# Patient Record
Sex: Female | Born: 1940 | Race: White | Hispanic: No | Smoking: Never smoker
Health system: Southern US, Community
[De-identification: ages and names within clinical notes are randomized; demographics above are authoritative.]

## PROBLEM LIST (undated history)

## (undated) DIAGNOSIS — IMO0001 Reserved for inherently not codable concepts without codable children: Secondary | ICD-10-CM

## (undated) DIAGNOSIS — Z9889 Other specified postprocedural states: Secondary | ICD-10-CM

## (undated) DIAGNOSIS — N19 Unspecified kidney failure: Secondary | ICD-10-CM

## (undated) DIAGNOSIS — Z8673 Personal history of transient ischemic attack (TIA), and cerebral infarction without residual deficits: Secondary | ICD-10-CM

## (undated) DIAGNOSIS — E785 Hyperlipidemia, unspecified: Secondary | ICD-10-CM

## (undated) DIAGNOSIS — E039 Hypothyroidism, unspecified: Secondary | ICD-10-CM

## (undated) DIAGNOSIS — Z86718 Personal history of other venous thrombosis and embolism: Secondary | ICD-10-CM

## (undated) DIAGNOSIS — I6529 Occlusion and stenosis of unspecified carotid artery: Secondary | ICD-10-CM

## (undated) DIAGNOSIS — G4730 Sleep apnea, unspecified: Secondary | ICD-10-CM

## (undated) DIAGNOSIS — G8929 Other chronic pain: Secondary | ICD-10-CM

## (undated) DIAGNOSIS — Z95828 Presence of other vascular implants and grafts: Secondary | ICD-10-CM

## (undated) DIAGNOSIS — M1990 Unspecified osteoarthritis, unspecified site: Secondary | ICD-10-CM

## (undated) DIAGNOSIS — F419 Anxiety disorder, unspecified: Secondary | ICD-10-CM

## (undated) DIAGNOSIS — I2510 Atherosclerotic heart disease of native coronary artery without angina pectoris: Secondary | ICD-10-CM

## (undated) DIAGNOSIS — K222 Esophageal obstruction: Secondary | ICD-10-CM

## (undated) DIAGNOSIS — I10 Essential (primary) hypertension: Secondary | ICD-10-CM

## (undated) DIAGNOSIS — Z87442 Personal history of urinary calculi: Secondary | ICD-10-CM

## (undated) DIAGNOSIS — J189 Pneumonia, unspecified organism: Secondary | ICD-10-CM

## (undated) DIAGNOSIS — F32A Depression, unspecified: Secondary | ICD-10-CM

## (undated) DIAGNOSIS — I38 Endocarditis, valve unspecified: Secondary | ICD-10-CM

## (undated) DIAGNOSIS — D649 Anemia, unspecified: Secondary | ICD-10-CM

## (undated) DIAGNOSIS — I219 Acute myocardial infarction, unspecified: Secondary | ICD-10-CM

## (undated) DIAGNOSIS — J449 Chronic obstructive pulmonary disease, unspecified: Secondary | ICD-10-CM

## (undated) DIAGNOSIS — Z48812 Encounter for surgical aftercare following surgery on the circulatory system: Secondary | ICD-10-CM

## (undated) DIAGNOSIS — M79606 Pain in leg, unspecified: Secondary | ICD-10-CM

## (undated) DIAGNOSIS — K449 Diaphragmatic hernia without obstruction or gangrene: Secondary | ICD-10-CM

## (undated) DIAGNOSIS — D689 Coagulation defect, unspecified: Secondary | ICD-10-CM

## (undated) DIAGNOSIS — I639 Cerebral infarction, unspecified: Secondary | ICD-10-CM

## (undated) DIAGNOSIS — I2699 Other pulmonary embolism without acute cor pulmonale: Secondary | ICD-10-CM

## (undated) DIAGNOSIS — K219 Gastro-esophageal reflux disease without esophagitis: Secondary | ICD-10-CM

## (undated) DIAGNOSIS — R1310 Dysphagia, unspecified: Secondary | ICD-10-CM

## (undated) DIAGNOSIS — F329 Major depressive disorder, single episode, unspecified: Secondary | ICD-10-CM

## (undated) DIAGNOSIS — M109 Gout, unspecified: Secondary | ICD-10-CM

## (undated) DIAGNOSIS — K859 Acute pancreatitis without necrosis or infection, unspecified: Secondary | ICD-10-CM

## (undated) DIAGNOSIS — K922 Gastrointestinal hemorrhage, unspecified: Secondary | ICD-10-CM

## (undated) HISTORY — DX: Personal history of other venous thrombosis and embolism: Z86.718

## (undated) HISTORY — DX: Personal history of transient ischemic attack (TIA), and cerebral infarction without residual deficits: Z86.73

## (undated) HISTORY — PX: BREAST SURGERY: SHX581

## (undated) HISTORY — DX: Other pulmonary embolism without acute cor pulmonale: I26.99

## (undated) HISTORY — DX: Gout, unspecified: M10.9

## (undated) HISTORY — DX: Depression, unspecified: F32.A

## (undated) HISTORY — DX: Cerebral infarction, unspecified: I63.9

## (undated) HISTORY — PX: WISDOM TOOTH EXTRACTION: SHX21

## (undated) HISTORY — DX: Occlusion and stenosis of unspecified carotid artery: I65.29

## (undated) HISTORY — PX: APPENDECTOMY: SHX54

## (undated) HISTORY — DX: Endocarditis, valve unspecified: I38

## (undated) HISTORY — DX: Gastrointestinal hemorrhage, unspecified: K92.2

## (undated) HISTORY — DX: Other specified postprocedural states: Z98.890

## (undated) HISTORY — DX: Unspecified osteoarthritis, unspecified site: M19.90

## (undated) HISTORY — DX: Anemia, unspecified: D64.9

## (undated) HISTORY — PX: IVC FILTER INSERTION: CATH118245

## (undated) HISTORY — DX: Reserved for inherently not codable concepts without codable children: IMO0001

## (undated) HISTORY — DX: Presence of other vascular implants and grafts: Z95.828

## (undated) HISTORY — PX: SHOULDER SURGERY: SHX246

## (undated) HISTORY — DX: Dysphagia, unspecified: R13.10

## (undated) HISTORY — DX: Major depressive disorder, single episode, unspecified: F32.9

## (undated) HISTORY — DX: Unspecified kidney failure: N19

## (undated) HISTORY — DX: Anxiety disorder, unspecified: F41.9

## (undated) HISTORY — DX: Chronic obstructive pulmonary disease, unspecified: J44.9

## (undated) HISTORY — DX: Diaphragmatic hernia without obstruction or gangrene: K44.9

## (undated) HISTORY — DX: Pain in leg, unspecified: M79.606

## (undated) HISTORY — DX: Hyperlipidemia, unspecified: E78.5

## (undated) HISTORY — PX: TOTAL KNEE ARTHROPLASTY: SHX125

## (undated) HISTORY — DX: Encounter for surgical aftercare following surgery on the circulatory system: Z48.812

## (undated) HISTORY — DX: Gastro-esophageal reflux disease without esophagitis: K21.9

## (undated) HISTORY — DX: Other chronic pain: G89.29

## (undated) HISTORY — DX: Coagulation defect, unspecified: D68.9

## (undated) HISTORY — DX: Esophageal obstruction: K22.2

## (undated) HISTORY — DX: Essential (primary) hypertension: I10

---

## 1978-09-26 HISTORY — PX: ABDOMINAL HYSTERECTOMY: SHX81

## 2003-09-27 HISTORY — PX: JOINT REPLACEMENT: SHX530

## 2010-11-08 ENCOUNTER — Encounter (INDEPENDENT_AMBULATORY_CARE_PROVIDER_SITE_OTHER): Payer: Medicare Other | Admitting: Surgery

## 2010-11-08 DIAGNOSIS — I6529 Occlusion and stenosis of unspecified carotid artery: Secondary | ICD-10-CM

## 2010-11-09 NOTE — Assessment & Plan Note (Signed)
OFFICE VISIT  Tina Dominguez, Tina Dominguez DOB:  Nov 02, 1940                                       11/08/2010 ZOXWR#:[id number]  REASON FOR VISIT:  Right brain stroke in the setting of right carotid stenosis.  HISTORY:  This is a 70 year old female who presented to [facility] and was diagnosed with a stroke approximately 2 weeks ago.  In mid January she had a TIA like episode where she had numbness and weakness in her left arm and left leg.  She had a similar episode which prompted her to go into the hospital and was diagnosed as having a stroke.  A CT scan was performed which showed high-grade right carotid stenosis with scattered punctate focal infarcts within the right middle cerebral artery territory.  She was in the hospital for 3-4 days.  Her symptoms have now resolved with exception of some numbness in her left fifth finger.  She does report having another episode of numbness and weakness approximately 1 week ago.  The patient was discharged home on aspirin and Plavix and according to her she is taking a cholesterol medicine now.  She has a history of factor V deficiency.  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  She does have a Greenfield filter in place.  She is not on anticoagulation.  She suffers from hypertension which is medically managed.  REVIEW OF SYSTEMS:  GENERAL:  Negative for fevers, chills, weight gain, weight loss. VASCULAR:  Positive for pain in legs with walking, history of stroke and mini stroke, history of venous clot. CARDIAC:  Positive for palpitations. GI:  Positive for reflux and esophageal stricture. HEME:  Positive for bleeding problems and a clotting disorder. MUSCULOSKELETAL:  Positive for arthritis, joint pain and muscle pain. PSYCHIATRIC:  Positive for depression. SKIN:  Positive for rash on her back.  PAST MEDICAL HISTORY:  Hypertension, hypercholesterolemia, factor V clotting disorder, osteoporosis,  depression.  SOCIAL HISTORY:  She is widowed, does not smoke, does not drink.  Has a history of smoking.  FAMILY HISTORY:  Positive for clotting disorder in her mother.  Her grandmother died of a stroke at age 72.  Her father died of a heart attack at age 16.  MEDICATIONS:  Include Plavix and aspirin, query statin.  ALLERGIES:  None.  PHYSICAL EXAMINATION:  Vital signs:  Heart rate 76, blood pressure 157/81 in the right, 153/90 in the left, respiratory rate 12.  General: She is well-appearing, in no distress.  HEENT:  Within normal limits. Lungs:  Clear bilaterally.  Cardiovascular:  Regular rate and rhythm. No murmur.  No carotid bruits.  Abdomen:  Soft, nontender. Musculoskeletal:  Without major deformities.  She does endorse significant back pain.  Neurological:  Negative for focal deficits except for numbness in her left fifth finger.  Skin:  Without rash. Extremities:  Warm and well-perfused.  DIAGNOSTIC STUDIES:  I reviewed her CT angiogram which shows a high- grade approximately 80% stenosis in the right carotid artery.  The bifurcation is below the angle of the mandible.  I think this should be accessible, the disease extends up approximately 1.5 cm above the carotid bifurcation.  ASSESSMENT:  Symptomatic right carotid stenosis.  PLAN:  I discussed the risks and benefits of surgical versus nonsurgical treatment.  We have decided to proceed with surgical endarterectomy. Risks and benefits were discussed including the risk  of stroke, the risk of nerve injury, the risk of cardiopulmonary complications.  This has been scheduled for this Thursday, February 16.  I think that her carotid symptoms trump any potential cardiac symptoms.  She denies any chest pain or shortness of breath.  She saw Dr. Sherril Croon many years ago for palpitations.  Her workup was negative.  I think she is at low risk from a cardiac perspective.    Jorge Ny, MD Electronically  Signed  VWB/MEDQ  D:  11/08/2010  T:  11/09/2010  Job:  [id number]  cc:   Durenda Hurt, M.D.

## 2010-11-10 ENCOUNTER — Encounter (HOSPITAL_COMMUNITY)
Admission: RE | Admit: 2010-11-10 | Discharge: 2010-11-10 | Disposition: A | Discharge status: Home / Self Care | Payer: Medicare Other | Source: Ambulatory Visit | Attending: Surgery | Admitting: Surgery

## 2010-11-10 ENCOUNTER — Other Ambulatory Visit: Payer: Self-pay | Admitting: Surgery

## 2010-11-10 DIAGNOSIS — Z01818 Encounter for other preprocedural examination: Secondary | ICD-10-CM | POA: Insufficient documentation

## 2010-11-10 DIAGNOSIS — Z01812 Encounter for preprocedural laboratory examination: Secondary | ICD-10-CM | POA: Insufficient documentation

## 2010-11-10 DIAGNOSIS — I10 Essential (primary) hypertension: Secondary | ICD-10-CM

## 2010-11-10 LAB — COMPREHENSIVE METABOLIC PANEL
ALT: 13 U/L (ref 0–35)
AST: 16 U/L (ref 0–37)
Alkaline Phosphatase: 91 U/L (ref 39–117)
CO2: 28 mEq/L (ref 19–32)
Chloride: 105 mEq/L (ref 96–112)
GFR calc non Af Amer: >60 mL/min (ref >60)
Glucose, Bld: 106 mg/dL — ABNORMAL HIGH (ref 70–99)
Potassium: 3.6 mEq/L (ref 3.5–5.1)
Sodium: 140 mEq/L (ref 135–145)
Total Bilirubin: 0.2 mg/dL — ABNORMAL LOW (ref 0.3–1.2)

## 2010-11-10 LAB — URINALYSIS, ROUTINE W REFLEX MICROSCOPIC
Bilirubin Urine: NEGATIVE
Hgb urine dipstick: NEGATIVE
Ketones, ur: NEGATIVE mg/dL
Protein, ur: NEGATIVE mg/dL
Urine Glucose, Fasting: NEGATIVE mg/dL

## 2010-11-10 LAB — CBC
HCT: 39.4 % (ref 36.0–46.0)
Hemoglobin: 13.6 g/dL (ref 12.0–15.0)
MCH: 31.1 pg (ref 26.0–34.0)
MCV: 90.2 fL (ref 78.0–100.0)
Platelets: 414 10*3/uL — ABNORMAL HIGH (ref 150–400)
RBC: 4.37 MIL/uL (ref 3.87–5.11)

## 2010-11-10 LAB — SURGICAL PCR SCREEN: Staphylococcus aureus: POSITIVE — AB

## 2010-11-11 ENCOUNTER — Inpatient Hospital Stay (HOSPITAL_COMMUNITY)
Admission: RE | Admit: 2010-11-11 | Discharge: 2010-11-12 | DRG: 039 | Disposition: A | Discharge status: Home / Self Care | Payer: Medicare Other | Source: Ambulatory Visit | Attending: Surgery | Admitting: Surgery

## 2010-11-11 DIAGNOSIS — Z01818 Encounter for other preprocedural examination: Secondary | ICD-10-CM

## 2010-11-11 DIAGNOSIS — Z86711 Personal history of pulmonary embolism: Secondary | ICD-10-CM

## 2010-11-11 DIAGNOSIS — Z01812 Encounter for preprocedural laboratory examination: Secondary | ICD-10-CM

## 2010-11-11 DIAGNOSIS — I6529 Occlusion and stenosis of unspecified carotid artery: Secondary | ICD-10-CM

## 2010-11-11 DIAGNOSIS — I10 Essential (primary) hypertension: Secondary | ICD-10-CM | POA: Diagnosis present

## 2010-11-11 DIAGNOSIS — Z8673 Personal history of transient ischemic attack (TIA), and cerebral infarction without residual deficits: Secondary | ICD-10-CM

## 2010-11-11 DIAGNOSIS — K219 Gastro-esophageal reflux disease without esophagitis: Secondary | ICD-10-CM | POA: Diagnosis present

## 2010-11-11 DIAGNOSIS — Z96659 Presence of unspecified artificial knee joint: Secondary | ICD-10-CM

## 2010-11-11 DIAGNOSIS — M549 Dorsalgia, unspecified: Secondary | ICD-10-CM | POA: Diagnosis present

## 2010-11-11 HISTORY — PX: CAROTID ENDARTERECTOMY: SUR193

## 2010-11-12 ENCOUNTER — Other Ambulatory Visit: Payer: Self-pay | Admitting: Surgery

## 2010-11-12 LAB — BASIC METABOLIC PANEL
BUN: 6 mg/dL (ref 6–23)
CO2: 26 mEq/L (ref 19–32)
Calcium: 8.4 mg/dL (ref 8.4–10.5)
Creatinine, Ser: 0.65 mg/dL (ref 0.4–1.2)
GFR calc Af Amer: >60 mL/min (ref >60)

## 2010-11-12 LAB — CBC
MCH: 30.3 pg (ref 26.0–34.0)
MCHC: 33.8 g/dL (ref 30.0–36.0)
MCV: 89.8 fL (ref 78.0–100.0)
Platelets: 280 10*3/uL (ref 150–400)

## 2010-11-14 LAB — TYPE AND SCREEN
DAT, IgG: NEGATIVE
Donor AG Type: NEGATIVE
PT AG Type: NEGATIVE
Unit division: 0

## 2010-11-28 NOTE — Op Note (Addendum)
Tina Dominguez, TESMER              ACCOUNT NO.:  [id number]  MEDICAL RECORD NO.:  [id number]           PATIENT TYPE:  I  LOCATION:  3307                         FACILITY:  [facility]  PHYSICIAN:  Juleen China IV, MDDATE OF BIRTH:  1941/08/10  DATE OF PROCEDURE:  11/11/2010 DATE OF DISCHARGE:  11/10/2010                              OPERATIVE REPORT   PREOPERATIVE DIAGNOSIS:  Symptomatic right carotid stenosis.  POSTOPERATIVE DIAGNOSIS:  Symptomatic right carotid stenosis.  PROCEDURE PERFORMED:  Right carotid endarterectomy with patch angioplasty.  SURGEON: 1. Charlena Cross, MD  ASSISTANT:  Di Kindle. Edilia Bo, MD  ANESTHESIA:  General.  BLOOD LOSS:  150 mL.  FINDINGS:  Approximately 2 cm above the carotid bifurcation in the internal carotid artery, there was an Delaware of calcium which created approximately 80% stenosis.  There was also a plaque that had ruptured.  SPECIMENS:  Plaque.  DRAINS:  None.  INDICATIONS:  Ms. Encarnacion is a 70 year old female who I saw this past Monday with symptomatic right carotid stenosis.  She has had documentation confirming she has had a right brain stroke.  Her only residual symptoms have been numbness in her left fifth finger.  She has been treated with Plavix.  She did have a TIA-like symptom yesterday consisting of left arm and left leg numbness and weakness.  It had resolved fully at the time of the operation, it lasted approximately 45 minutes.  PROCEDURE:  The patient was identify in the holding area and taken to room 6, placed supine on the table.  General endotracheal anesthesia was administered.  The patient was prepped and draped in usual fashion.  I placed SCDs on both lower extremities and as soon as the patient was put to sleep, I gave her 3000 units of IV heparin.  This was because of the patient's factor V deficiency and history of PEs after her previous surgery.  She does now have a Greenfield filter in  place.  An incision was made along the anterior border of the right sternocleidomastoid.  Cautery was used throughout the subcutaneous tissue.  The platysma muscle was divided with electrocautery. Dissection was then carried down to the internal jugular vein.  The facial vein now was identified and divided between 2-0 silk ties and metal clips.  The patient had a rather low bifurcation.  I proceeded with exposure of the common carotid artery and made sure to have a very minimal manipulation of the carotid artery given her unstable symptoms. Common carotid artery was circumferentially dissected free and encircled with a vessel loop.  The vagus nerve was seen on the posterolateral side and protected.  I then isolated the superior thyroid and the external carotid artery.  These were encircled with vessel loop.  I then proceeded with cephalad dissection in the internal carotid artery.  I visualized the hypoglossal nerve, it was protected and visualized throughout the entire dissection of the internal carotid artery.  I used minimal manipulation of the internal carotid.  Once the internal carotid artery was satisfactorily dissected out, I gave the patient an additional heparin bolus.  I then occluded the internal carotid artery  followed by the external and common carotid artery.  A #11 blade was used to make an arteriotomy.  This was extended with Potts scissors along the anterolateral border of the common and internal carotid artery.  Posterior plaque was identified throughout most of the internal carotid artery and its proximal portion.  About 2 cm cephalad to the bifurcation, there was an area of thickened calcific plaque which provided approximately 75-80% stenosis.  Just above this was an area of hemorrhagic ruptured plaque.  It was very focal.  At this point, a 10- [place] shunt was placed.  Endarterectomy was then performed using a Kleinert-Kutz elevator.  Eversion endarterectomy was  performed in the external carotid artery.  A good distal endpoint in the internal carotid artery was obtained and the plaque was removed.  I inspected the endarterectomized bed for any potential embolic debris.  These were all removed.  I selected the bovine pericardial patch.  Patch angioplasty was then performed using a running 6-0 Prolene.  Prior to completion of the patch repair, the shunt was removed.  The internal common and external carotid arteries were flushed appropriately.  The artery was instilled with heparinized saline and the anastomosis was completed.  A clamp was first released on the external carotid artery followed by the common carotid artery.  Approximately 30 seconds later, the internal carotid clamp was released.  Handheld Doppler was used to evaluate the signals in the common, internal and external carotid artery; they all had appropriate signals.  I then reversed the patient's heparin with 75 mg of protamine.  Of note, she did receive several boluses of heparin throughout the procedure to make sure that she was well anticoagulated. Once I was satisfied with hemostasis, the carotid sheath was reapproximated with 3-0 Vicryl, the platysma muscle was closed with 3-0 Vicryl, and the skin was closed with running 4-0 Vicryl.  Dermabond was placed on the wound.     Jorge Ny, MD     VWB/MEDQ  D:  11/11/2010  T:  11/12/2010  Job:  [id number]  Electronically Signed by Arelia Longest IV MD on 11/28/2010 07:39:55 PM

## 2010-11-29 ENCOUNTER — Ambulatory Visit: Payer: Medicare Other | Admitting: Surgery

## 2010-11-29 ENCOUNTER — Other Ambulatory Visit (INDEPENDENT_AMBULATORY_CARE_PROVIDER_SITE_OTHER): Payer: Medicare Other

## 2010-11-29 ENCOUNTER — Ambulatory Visit (INDEPENDENT_AMBULATORY_CARE_PROVIDER_SITE_OTHER): Payer: Medicare Other | Admitting: Surgery

## 2010-11-29 DIAGNOSIS — G459 Transient cerebral ischemic attack, unspecified: Secondary | ICD-10-CM

## 2010-11-29 DIAGNOSIS — I6529 Occlusion and stenosis of unspecified carotid artery: Secondary | ICD-10-CM

## 2010-12-08 NOTE — Procedures (Unsigned)
CAROTID DUPLEX EXAM  INDICATION:  Known carotid disease with recent right upper extremity symptoms.  HISTORY: Diabetes:  No. Cardiac:  No. Hypertension:  Yes. Smoking:  No. Previous Surgery:  Right CEA, 11/11/2010. CV History:  Right fifth upper extremity digit "went numb" for approximately 30 minutes. Amaurosis Fugax No, Paresthesias Yes, Hemiparesis No.                                      RIGHT             LEFT Brachial systolic pressure: Brachial Doppler waveforms: Vertebral direction of flow:                          Antegrade DUPLEX VELOCITIES (cm/sec) CCA peak systolic                                     115 ECA peak systolic                                     80 ICA peak systolic                                     P = 98, M = 132 ICA end diastolic                                     P = 35, M = 51 PLAQUE MORPHOLOGY:                                    Calcific PLAQUE AMOUNT:                                        Minimal PLAQUE LOCATION: Bifurcation/proximal ICA  IMPRESSION: 1. Left-side only study, per Dr. Myra Gianotti. 2. No evidence of hemodynamically significant stenosis, based on     proximal internal carotid artery.  Mid internal carotid artery     velocities are somewhat elevated in the area of tortuosity.  ___________________________________________ V. Charlena Cross, MD  AS/MEDQ  D:  11/29/2010  T:  11/29/2010  Job:  [id number]

## 2010-12-14 ENCOUNTER — Other Ambulatory Visit: Payer: Self-pay | Admitting: Specialist

## 2010-12-14 DIAGNOSIS — M7130 Other bursal cyst, unspecified site: Secondary | ICD-10-CM

## 2010-12-15 ENCOUNTER — Ambulatory Visit
Admission: RE | Admit: 2010-12-15 | Discharge: 2010-12-15 | Disposition: A | Discharge status: Home / Self Care | Payer: Medicare Other | Source: Ambulatory Visit | Attending: Specialist | Admitting: Specialist

## 2010-12-15 ENCOUNTER — Other Ambulatory Visit: Payer: Self-pay | Admitting: Specialist

## 2010-12-15 DIAGNOSIS — R52 Pain, unspecified: Secondary | ICD-10-CM

## 2010-12-15 DIAGNOSIS — M549 Dorsalgia, unspecified: Secondary | ICD-10-CM

## 2010-12-15 DIAGNOSIS — M1990 Unspecified osteoarthritis, unspecified site: Secondary | ICD-10-CM

## 2010-12-15 DIAGNOSIS — IMO0002 Reserved for concepts with insufficient information to code with codable children: Secondary | ICD-10-CM

## 2010-12-15 DIAGNOSIS — M7130 Other bursal cyst, unspecified site: Secondary | ICD-10-CM

## 2010-12-15 DIAGNOSIS — M47816 Spondylosis without myelopathy or radiculopathy, lumbar region: Secondary | ICD-10-CM

## 2010-12-15 DIAGNOSIS — M479 Spondylosis, unspecified: Secondary | ICD-10-CM

## 2011-04-06 NOTE — Assessment & Plan Note (Signed)
OFFICE VISIT  Tina, Dominguez DOB:  Jan 19, 1941                                       11/29/2010 ZOXWR#:[id number]  The patient comes back today for her first postoperative visit.  She is status post right carotid endarterectomy on 02/16.  She was having some numbness in her hand and was found to have had a stroke.  Her operation was uncomplicated.  She remains neurologically intact.  She has had no issues since she has been at home.  She did state to me today that on the way over she had some numbness in her right fifth finger.  For that reason I ultrasounded her left carotid.  This shows no hemodynamically significant stenosis.  The patient's incision is well-healed.  She is neurologically intact.  I plan on seeing her back in 6 months with a repeat ultrasound.    Jorge Ny, MD Electronically Signed  VWB/MEDQ  D:  11/29/2010  T:  11/30/2010  Job:  [id number]  cc:   Durenda Hurt, M.D.

## 2011-05-13 ENCOUNTER — Encounter: Payer: Self-pay | Admitting: Surgery

## 2011-05-26 ENCOUNTER — Ambulatory Visit (INDEPENDENT_AMBULATORY_CARE_PROVIDER_SITE_OTHER): Payer: Medicare Other | Admitting: Emergency Medicine

## 2011-05-26 ENCOUNTER — Encounter: Payer: Self-pay | Admitting: Emergency Medicine

## 2011-05-26 DIAGNOSIS — I2699 Other pulmonary embolism without acute cor pulmonale: Secondary | ICD-10-CM | POA: Insufficient documentation

## 2011-05-26 DIAGNOSIS — F329 Major depressive disorder, single episode, unspecified: Secondary | ICD-10-CM

## 2011-05-26 DIAGNOSIS — I6350 Cerebral infarction due to unspecified occlusion or stenosis of unspecified cerebral artery: Secondary | ICD-10-CM

## 2011-05-26 DIAGNOSIS — K219 Gastro-esophageal reflux disease without esophagitis: Secondary | ICD-10-CM

## 2011-05-26 DIAGNOSIS — F3289 Other specified depressive episodes: Secondary | ICD-10-CM

## 2011-05-26 DIAGNOSIS — I6523 Occlusion and stenosis of bilateral carotid arteries: Secondary | ICD-10-CM | POA: Insufficient documentation

## 2011-05-26 DIAGNOSIS — I6529 Occlusion and stenosis of unspecified carotid artery: Secondary | ICD-10-CM

## 2011-05-26 DIAGNOSIS — M129 Arthropathy, unspecified: Secondary | ICD-10-CM

## 2011-05-26 DIAGNOSIS — I10 Essential (primary) hypertension: Secondary | ICD-10-CM | POA: Insufficient documentation

## 2011-05-26 DIAGNOSIS — I639 Cerebral infarction, unspecified: Secondary | ICD-10-CM | POA: Insufficient documentation

## 2011-05-26 DIAGNOSIS — M1990 Unspecified osteoarthritis, unspecified site: Secondary | ICD-10-CM | POA: Insufficient documentation

## 2011-05-26 DIAGNOSIS — E785 Hyperlipidemia, unspecified: Secondary | ICD-10-CM | POA: Insufficient documentation

## 2011-05-26 DIAGNOSIS — F32A Depression, unspecified: Secondary | ICD-10-CM | POA: Insufficient documentation

## 2011-05-26 HISTORY — DX: Other pulmonary embolism without acute cor pulmonale: I26.99

## 2011-05-26 HISTORY — DX: Gastro-esophageal reflux disease without esophagitis: K21.9

## 2011-05-26 HISTORY — DX: Unspecified osteoarthritis, unspecified site: M19.90

## 2011-05-26 HISTORY — DX: Occlusion and stenosis of bilateral carotid arteries: I65.23

## 2011-05-26 NOTE — Progress Notes (Signed)
  Subjective:    Patient ID: Tina Dominguez, female    DOB: 1941/07/06, 70 y.o.   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 HTN, controlled. No known hx of CAD or heart rhythm disturbance. She denies exertional SOB.   Review of Systems  Constitutional: Negative.   HENT: Negative.   Eyes: Negative.   Respiratory: Negative.   Cardiovascular: Positive for chest pain.  Gastrointestinal: Negative.   Genitourinary: Negative.   Musculoskeletal: Positive for arthralgias.  Skin: Negative.   Neurological: Negative.   Hematological: Negative.   Psychiatric/Behavioral: Negative.     Past Medical History  Diagnosis Date  . Hyperlipidemia   . Hypertension   . Clotting disorder   . Stroke   . Arthritis   . Depression   . Reflux   . Leg pain     with walking  . Osteoporosis   . Carotid artery occlusion      Family History  Problem Relation Age of Onset  . Clotting disorder Mother   . Heart disease Father 30  . Heart disease Brother   . Stroke Maternal Grandmother      History   Social History  . Marital Status: Widowed    Spouse Name: N/A    Number of Children: N/A  . Years of Education: N/A   Occupational History  . Not on file.   Social History Main Topics  . Smoking status: Never Smoker   . Smokeless tobacco: Not on file  . Alcohol Use: No  . Drug Use: No  . Sexually Active: Not on file   Other Topics Concern  . Not on file   Social History Narrative  . No narrative on file     No Known Allergies   Outpatient Prescriptions Prior to Visit  Medication Sig Dispense Refill  . clopidogrel (PLAVIX) 75 MG tablet Take 75 mg by mouth daily.        Marland Kitchen oxyCODONE-acetaminophen (PERCOCET) 5-325 MG per tablet Take 1 tablet by mouth every 4 (four) hours as needed.             Objective:   Physical Exam  Gen: Pleasant, well-nourished, in no distress,  normal affect  ENT: No lesions,  mouth clear,  oropharynx clear, no postnasal drip  Neck: No JVD, no TMG, no carotid bruits  Lungs: No use of accessory muscles, no dullness to percussion, clear without rales or rhonchi  Cardiovascular: RRR, heart sounds normal, no murmur or gallops, no peripheral edema  Musculoskeletal: No deformities, no cyanosis or clubbing  Neuro: alert, non focal  Skin: Warm, no lesions or rashes     Assessment & Plan:  PE (pulmonary embolism) Hx of PE, last time 7 yrs ago with IVC filter placed. Now back on coumadin x 5 months. She is mild to moderately elevated risk for surgery, but I believe the surgery can be done. We will need to insure that she is bridged with heparin prior to procedure, and then will need to minimize the amount of time she is off anticoagulation.  - will perform walking oximetry today -> no desat.  - will send this info to Dr Wynetta Emery. We will be happy to help with her care in house.

## 2011-05-26 NOTE — Patient Instructions (Signed)
Your history of pulmonary embolism puts you at mild to moderate increased risk for your surgery, but does not preclude you from getting the procedure They will need to bridge you with heparin prior to the procedure, and will need to minimize the amount of time you are off anticoagulation Your walking oximetry test was normal

## 2011-05-26 NOTE — Assessment & Plan Note (Signed)
Hx of PE, last time 7 yrs ago with IVC filter placed. Now back on coumadin x 5 months. She is mild to moderately elevated risk for surgery, but I believe the surgery can be done. We will need to insure that she is bridged with heparin prior to procedure, and then will need to minimize the amount of time she is off anticoagulation.  - will perform walking oximetry today -> no desat.  - will send this info to Dr Wynetta Emery. We will be happy to help with her care in house.

## 2011-06-03 ENCOUNTER — Encounter: Payer: Self-pay | Admitting: Surgery

## 2011-06-06 ENCOUNTER — Ambulatory Visit: Payer: Medicare Other | Admitting: Surgery

## 2011-06-06 ENCOUNTER — Other Ambulatory Visit: Payer: Medicare Other

## 2011-06-27 HISTORY — PX: BACK SURGERY: SHX140

## 2011-06-29 ENCOUNTER — Telehealth: Payer: Self-pay | Admitting: Emergency Medicine

## 2011-06-29 NOTE — Telephone Encounter (Signed)
vanessa advised. Carron Curie, CMA

## 2011-06-29 NOTE — Telephone Encounter (Signed)
Spoke with Erie Noe. She states needs to know how many days prior to surgery to bridge heparin- 2 or 4? Please advise, thanks!

## 2011-06-29 NOTE — Telephone Encounter (Signed)
Please let Erie Noe and Dr Wynetta Emery know that they should be able to bridge her in 2 days. The PT/INR will need to be at Dr Lonie Peak goal for surgery. Also need to minimize the duration of time she is off anticoagulation post-op, depending on Dr Lonie Peak findings. Thanks.

## 2011-07-06 ENCOUNTER — Other Ambulatory Visit (HOSPITAL_COMMUNITY): Payer: Medicare Other

## 2011-07-11 ENCOUNTER — Inpatient Hospital Stay (HOSPITAL_COMMUNITY)
Admission: AD | Admit: 2011-07-11 | Discharge: 2011-07-19 | DRG: 460 | Disposition: A | Discharge status: Home / Self Care | Payer: Medicare Other | Source: Ambulatory Visit | Attending: Neurosurgery | Admitting: Neurosurgery

## 2011-07-11 DIAGNOSIS — Q762 Congenital spondylolisthesis: Principal | ICD-10-CM

## 2011-07-11 DIAGNOSIS — Z96659 Presence of unspecified artificial knee joint: Secondary | ICD-10-CM

## 2011-07-11 DIAGNOSIS — Z8673 Personal history of transient ischemic attack (TIA), and cerebral infarction without residual deficits: Secondary | ICD-10-CM

## 2011-07-11 DIAGNOSIS — Z86711 Personal history of pulmonary embolism: Secondary | ICD-10-CM

## 2011-07-11 DIAGNOSIS — E7800 Pure hypercholesterolemia, unspecified: Secondary | ICD-10-CM | POA: Diagnosis present

## 2011-07-11 DIAGNOSIS — Z23 Encounter for immunization: Secondary | ICD-10-CM

## 2011-07-11 DIAGNOSIS — M7130 Other bursal cyst, unspecified site: Secondary | ICD-10-CM | POA: Diagnosis present

## 2011-07-11 DIAGNOSIS — Z79899 Other long term (current) drug therapy: Secondary | ICD-10-CM

## 2011-07-11 DIAGNOSIS — Z7901 Long term (current) use of anticoagulants: Secondary | ICD-10-CM

## 2011-07-11 DIAGNOSIS — I10 Essential (primary) hypertension: Secondary | ICD-10-CM | POA: Diagnosis present

## 2011-07-11 HISTORY — PX: SPINE SURGERY: SHX786

## 2011-07-12 ENCOUNTER — Inpatient Hospital Stay (HOSPITAL_COMMUNITY): Payer: Medicare Other

## 2011-07-12 LAB — CBC
Hemoglobin: 12.7 g/dL (ref 12.0–15.0)
MCH: 31.1 pg (ref 26.0–34.0)
MCHC: 34.2 g/dL (ref 30.0–36.0)
Platelets: 312 10*3/uL (ref 150–400)

## 2011-07-12 LAB — HEPARIN LEVEL (UNFRACTIONATED): Heparin Unfractionated: 0.41 IU/mL (ref 0.30–0.70)

## 2011-07-12 LAB — DIFFERENTIAL
Basophils Absolute: 0 10*3/uL (ref 0.0–0.1)
Basophils Relative: 0 % (ref 0–1)
Eosinophils Absolute: 0.1 10*3/uL (ref 0.0–0.7)
Monocytes Absolute: 0.3 10*3/uL (ref 0.1–1.0)
Neutro Abs: 4.2 10*3/uL (ref 1.7–7.7)
Neutrophils Relative %: 63 % (ref 43–77)

## 2011-07-12 LAB — BASIC METABOLIC PANEL
BUN: 16 mg/dL (ref 6–23)
Calcium: 9.6 mg/dL (ref 8.4–10.5)
Chloride: 105 mEq/L (ref 96–112)
Creatinine, Ser: 0.68 mg/dL (ref 0.50–1.10)
GFR calc Af Amer: >90 mL/min (ref >90)
GFR calc non Af Amer: 89 mL/min — ABNORMAL LOW (ref >90)

## 2011-07-12 LAB — PROTIME-INR
INR: 0.91 (ref 0.00–1.49)
Prothrombin Time: 12.5 seconds (ref 11.6–15.2)

## 2011-07-13 ENCOUNTER — Inpatient Hospital Stay (HOSPITAL_COMMUNITY): Payer: Medicare Other

## 2011-07-13 ENCOUNTER — Inpatient Hospital Stay (HOSPITAL_COMMUNITY): Admission: RE | Admit: 2011-07-13 | Payer: Medicare Other | Source: Ambulatory Visit | Admitting: Neurosurgery

## 2011-07-13 LAB — CBC
HCT: 33.3 % — ABNORMAL LOW (ref 36.0–46.0)
Hemoglobin: 11.2 g/dL — ABNORMAL LOW (ref 12.0–15.0)
MCH: 30.6 pg (ref 26.0–34.0)
MCHC: 33.6 g/dL (ref 30.0–36.0)
MCV: 91 fL (ref 78.0–100.0)
RDW: 13.1 % (ref 11.5–15.5)

## 2011-07-13 LAB — SURGICAL PCR SCREEN: Staphylococcus aureus: POSITIVE — AB

## 2011-07-13 LAB — PROTIME-INR: INR: 0.95 (ref 0.00–1.49)

## 2011-07-14 LAB — CBC
MCH: 30.7 pg (ref 26.0–34.0)
MCHC: 33 g/dL (ref 30.0–36.0)
MCV: 93.1 fL (ref 78.0–100.0)
Platelets: 228 10*3/uL (ref 150–400)
RBC: 3.48 MIL/uL — ABNORMAL LOW (ref 3.87–5.11)
RDW: 13.4 % (ref 11.5–15.5)

## 2011-07-14 LAB — BASIC METABOLIC PANEL
Calcium: 8.2 mg/dL — ABNORMAL LOW (ref 8.4–10.5)
Creatinine, Ser: 0.7 mg/dL (ref 0.50–1.10)
GFR calc Af Amer: >90 mL/min (ref >90)
GFR calc non Af Amer: 88 mL/min — ABNORMAL LOW (ref >90)
Sodium: 136 mEq/L (ref 135–145)

## 2011-07-15 LAB — CBC
Platelets: 207 10*3/uL (ref 150–400)
RBC: 3.16 MIL/uL — ABNORMAL LOW (ref 3.87–5.11)
RDW: 13.2 % (ref 11.5–15.5)
WBC: 8.7 10*3/uL (ref 4.0–10.5)

## 2011-07-16 LAB — CBC
HCT: 28.9 % — ABNORMAL LOW (ref 36.0–46.0)
Hemoglobin: 10 g/dL — ABNORMAL LOW (ref 12.0–15.0)
MCHC: 34.6 g/dL (ref 30.0–36.0)
WBC: 8.8 10*3/uL (ref 4.0–10.5)

## 2011-07-17 LAB — TYPE AND SCREEN
DAT, IgG: NEGATIVE
Unit division: 0
Unit division: 0

## 2011-07-17 LAB — CBC
Hemoglobin: 10.2 g/dL — ABNORMAL LOW (ref 12.0–15.0)
MCH: 30.7 pg (ref 26.0–34.0)
MCV: 91.9 fL (ref 78.0–100.0)
Platelets: 272 10*3/uL (ref 150–400)
RBC: 3.32 MIL/uL — ABNORMAL LOW (ref 3.87–5.11)
WBC: 6.6 10*3/uL (ref 4.0–10.5)

## 2011-07-17 LAB — PROTIME-INR: Prothrombin Time: 13.7 seconds (ref 11.6–15.2)

## 2011-07-18 LAB — PROTIME-INR: Prothrombin Time: 15.6 seconds — ABNORMAL HIGH (ref 11.6–15.2)

## 2011-07-18 LAB — CBC
Hemoglobin: 9.7 g/dL — ABNORMAL LOW (ref 12.0–15.0)
MCH: 30.2 pg (ref 26.0–34.0)
MCV: 92.2 fL (ref 78.0–100.0)
Platelets: 307 10*3/uL (ref 150–400)
RBC: 3.21 MIL/uL — ABNORMAL LOW (ref 3.87–5.11)
WBC: 6.2 10*3/uL (ref 4.0–10.5)

## 2011-07-18 NOTE — Op Note (Signed)
Tina Dominguez, Tina Dominguez NO.:  [id number]  MEDICAL RECORD NO.:  [id number]  LOCATION:  3041                         FACILITY:  [facility]  PHYSICIAN:  Donalee Citrin, M.D.        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  PROCEDURE:  Gill decompression L5-S1, decompressive laminectomy L4-L5, posterior lumbar interbody fusion L4-L5 and L5-S1, open reduction spinal deformity L5-S1, pedicle screw fixation L4-S1 using the globus revere pedicle screw 5.5 system, posterior lumbar interbody fusion L4-L5 using Telamon Peek cage 10 x 22 mm packed with locally harvested autograft mixed with morselized allograft and tangent allograft wedge at L5-S1. It was a left-sided T lift using the Capstone PEEK cage packed with local autograft mixed with Actifuse and BMP.  Posterolateral arthrodesis L4-S1 using locally harvested autograft mixed with morselized allograft and BMP.  SURGEON:  Donalee Citrin, MD  ASSISTANT:  Dutch Quint  ANESTHESIA:  General endotracheal.  HISTORY OF PRESENT ILLNESS:  The patient is a very pleasant 70 year old female who has had longstanding back and predominant left leg pain radiating down the front of her shin, top of foot and big toe as well as the outside of bottom of foot consistent with L4-L5 and S1 nerve root pattern.  MRI scan showed grade 1 spondylolisthesis at L4-L5 and L5-S1 with a large synovial cyst at L4-L5 causing severe thecal sac compression.  The patient failed all forms of treatments due to progressive neuro exam and MRI findings and clinical exam.  There is a dynamic instability.  The patient was recommended decompression stabilization procedure.  Risks and benefits of the  operation were discussed with the patient, she understands and agrees to proceed forward.  DESCRIPTION OF PROCEDURE:  The patient was brought to the OR, was induced under general anesthesia, positioned prone on the Wilson frame. Back was prepped and draped in the usual sterile fashion.  Preop incision was localized at appropriate level.  So, a midline incision was made.  Bovie electrocautery was used to take down the subcutaneous. Subperiosteal dissection was carried on the lamina of L4, L5, and S1. She was noted to have marked facet arthropathy at L4-L5 and L5-S1 and unstable laminar complex at L5.  So after T-pieces were exposed at L4 as well as the sacral alae and T-piece at L5, intraoperative x-ray confirmed identification at appropriate level.  The spinous processes at L4 and L5 were then removed.  Central decompression was initiated. Complete medial facetectomies were initially performed on the right at L4-L5.  This allowed identification of the decompression of the 4 and the 5 root on the right.  Then, the large synovial cyst was immediately identified.  This was teased off the dura after being densely adherent decompressing the 4 and 5 on the left with a complete medial facetectomy performed there.  Then, after central decompression was performed at L5- S1 with a complete medial facetectomy on the left looking  at the right side, there was what appeared to be almost like a conjoined S1-S2 takeoff.  There was no space to get around the root complex in order to do any kind of interbody work.  So, it was elected at this level that I would do a T-lift.  So after all the decompression had been completed and all the foramina were widely patent, attention was taken to the interbody work.  The disk space was incised at L5-S1 in the left, cleaned out with pituitary rongeurs, sequentially dilated from a 6, 7, to an 8 interbody wedge using fluoroscopy each step along the way. Then, using a  size 8 paddle scraper as well as downgoing side-biting curettes, the endplates were scraped.  The disk space was opened up. This helped to reduce the L5-S1 spondylolisthesis.  After adequate endplate preparation been achieved, BMP and morselized allograft was packed centrally and off to the patient's right.  Then, a Capstone which had been packed with morselized allograft and autograft was then placed from the patient's left side across the midline at an angle and this was a 8 x 26 mm Capstone PEEK cage.  After fluoroscopy confirmed good position of the cage, attention was taken to L4-L5.  In a similar pattern, L4-L5 was cleaned out bilaterally with a 10 distractor, which had good apposition of the endplates.  The endplates were scraped with a size 10 cutter and chisel on the patient's right side.  A tangent autograft wedge was inserted on the right.  Then, the distractor was removed.  The endplates were prepped in similar fashion.  BMP and autograft was packed centrally after adequate endplate preparation was achieved and a Telamon PEEK cage was inserted.  After fluoroscopy confirmed good position of all the interbody implants, attention was taken to placement.  Using a high-speed drill, pilot holes were drilled at L4, cannulated with the awl, probed and tapped with a 5 x 5 tap probed again and 6 x 45 screw inserted L4.  In a similar fashion, 6 x 40 screw was inserted at L5 and a 6 x 35 at S1 capturing bicortical purchase on both S1 screws and this was repeated bilaterally.  Then, after all screws were placed, aggressive decortication carried out at T- piece and lateral gutters.  Remainder of local autograft was packed posterolaterally.  Rods were then placed to help tighten the __________ down and L4 was compressed against L5 and L5 was compressed against S1. Then, a large Hemovac drain was placed.  After the foramina were all reinspected to confirm patency, no migration of graft  material, Gelfoam was laid on top of the dura.  A drain was placed and was closed in layers with Vicryl and skin was closed running 4-0 subcuticular. Benzoin and Steri-Strips were applied.  The patient went to recovery room in stable condition.          ______________________________ Donalee Citrin, M.D.     GC/MEDQ  D:  07/13/2011  T:  07/13/2011  Job:  [id number]  Electronically Signed by Donalee Citrin M.D. on 07/18/2011 03:21:38 PM

## 2011-07-18 NOTE — Consult Note (Signed)
  NAMEDORETHY, TOMEY NO.:  [id number]  MEDICAL RECORD NO.:  [id number]  LOCATION:  3041                         FACILITY:  [facility]  PHYSICIAN:  Donalee Citrin, M.D.        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  PROCEDURE DURING THIS HOSPITALIZATION:  Decompression and stabilization procedure at L4-5 and L5-S1.  HISTORY OF PRESENT ILLNESS:  The patient is a very pleasant 70 year old female with a longstanding back and prominent left buttock and leg pain going down since beginning of the year.  It began in her hip, radiates down the front of the shin and top of the foot and consistent with an L5 nerve root pattern.  She went to an epidural, did not get any benefit. She continued to do exercise and medication with no relief.  She has had an MRI scan which showed a synovial cyst at L4-5 causing severe stenosis as well as spondylolisthesis and degenerative disk disease at L4-5 and L5-S1.  The patient has been recommended decompression stabilization procedure and has been recommended to come in the hospital for heparin before surgery.  The patient reports she stopped her Coumadin last week per the hospital recommendations and now she presents for heparinization.  PAST MEDICAL HISTORY:  Remarkable for, 1. Hypertension. 2. Hypercholesterolemia. 3. Multiple PEs, most recent which was 8 years ago following a knee     replacement.  She is a nonsmoker.  She has no medication allergies.  MEDICATIONS:  The current medication list includes, simvastatin, Percocet, lisinopril, hydrochlorothiazide, citalopram, and amitriptyline.  PHYSICAL EXAMINATION:  GENERAL:  The patient is awake and alert and oriented, 70 year old female in no acute distress. HEENT:  Within normal limits. EXTREMITIES:  Lower  extremity strength is 5/5 in iliopsoas, quads, hamstrings, gastroc, and EHL.  Normal symmetrical reflexes and sensation.  IMAGING:  MRI scan showing again the synovial cyst at L4-5 and L5-S1 and patient presents for decompression stabilization procedure.  Patient will be initiated first with Lovenox.  We are awaiting for the blood work to initiate heparin.  We will go ahead with heparin infusion overnight, discontinue at 8 a.m. prior to surgery, and then reinitiate it as soon as we can after surgery.  The patient will undergo a routine admit lab work for final EKG and chest x-ray.          ______________________________ Donalee Citrin, M.D.     GC/MEDQ  D:  07/12/2011  T:  07/13/2011  Job:  [id number]  Electronically Signed by Donalee Citrin M.D. on 07/18/2011 03:21:32 PM

## 2011-07-19 LAB — CBC
Hemoglobin: 9.9 g/dL — ABNORMAL LOW (ref 12.0–15.0)
Platelets: 346 10*3/uL (ref 150–400)
RBC: 3.27 MIL/uL — ABNORMAL LOW (ref 3.87–5.11)
WBC: 6.4 10*3/uL (ref 4.0–10.5)

## 2011-07-19 LAB — PROTIME-INR: Prothrombin Time: 18.6 seconds — ABNORMAL HIGH (ref 11.6–15.2)

## 2011-07-29 ENCOUNTER — Encounter: Payer: Self-pay | Admitting: Surgery

## 2011-08-01 ENCOUNTER — Ambulatory Visit: Payer: Medicare Other | Admitting: Surgery

## 2011-08-01 ENCOUNTER — Other Ambulatory Visit: Payer: Medicare Other

## 2011-08-01 NOTE — Discharge Summary (Signed)
NAMEGLENIS, MUSOLF NO.:  [id number]  MEDICAL RECORD NO.:  [id number]  LOCATION:  3017                         FACILITY:  [facility]  PHYSICIAN:  Donalee Citrin, M.D.        DATE OF BIRTH:  [birth year]  DATE OF ADMISSION:  07/11/2011 DATE OF DISCHARGE:  07/19/2011                              DISCHARGE SUMMARY   ADMITTING DIAGNOSIS:  Grade 1 spondylolisthesis with lumbar spine stenosis, L4-L5 and L5-S1.  DISCHARGE DIAGNOSIS:  Grade 1 spondylolisthesis with lumbar spine stenosis, L4-L5 and L5-S1.  HOSPITAL COURSE:  The patient was admitted on preop day 2 for heparinization, was admitted to the hospital bed 4, was placed on IV heparin, and stopped all of her Coumadin and that was on a Monday evening.  The patient remained stable over Tuesday afternoon, was taken to the operating on Wednesday.  Postoperatively, the patient developed severe hypertension and had to be transferred back to the ICU.  In the ICU, the patient had significant pain control issues and confusions, was observed. Did get a little bit better, was able to be transferred subsequently to the floor and on the floor, the patient improved significantly, was reinitiated on her Coumadin treatments on postop day 3.  She was ambulating with physical therapy, she was voiding spontaneously, she was tolerating regular diet, and at the time of discharge, pain was well controlled on oral analgesics.          ______________________________ Donalee Citrin, M.D.     GC/MEDQ  D:  07/31/2011  T:  07/31/2011  Job:  [id number]

## 2011-10-07 ENCOUNTER — Encounter: Payer: Self-pay | Admitting: Surgery

## 2011-10-10 ENCOUNTER — Ambulatory Visit (INDEPENDENT_AMBULATORY_CARE_PROVIDER_SITE_OTHER): Payer: Medicare Other | Admitting: Surgery

## 2011-10-10 ENCOUNTER — Encounter: Payer: Self-pay | Admitting: Surgery

## 2011-10-10 ENCOUNTER — Ambulatory Visit (INDEPENDENT_AMBULATORY_CARE_PROVIDER_SITE_OTHER): Payer: Medicare Other | Admitting: *Deleted

## 2011-10-10 DIAGNOSIS — I6529 Occlusion and stenosis of unspecified carotid artery: Secondary | ICD-10-CM

## 2011-10-10 DIAGNOSIS — Z48812 Encounter for surgical aftercare following surgery on the circulatory system: Secondary | ICD-10-CM

## 2011-10-10 DIAGNOSIS — Z9889 Other specified postprocedural states: Secondary | ICD-10-CM

## 2011-10-10 HISTORY — DX: Other specified postprocedural states: Z98.890

## 2011-10-10 NOTE — Progress Notes (Signed)
[facility]  [place]   Patient name: Tina Dominguez MRN: [id number] DOB: Nov 05, 1940 Sex: female     Chief Complaint  Patient presents with  . Carotid    6 month f/u with duplex today   ( Right CEA 11-11-10)    HISTORY OF PRESENT ILLNESS: Patient comes back today for followup. She is status post right carotid endarterectomy in February of 2012. This was done in the setting of her having had a stroke. She initially presented to [facility] in mid January with a TIA like episode with numbness and weakness in her left arm and left leg. She had a CT scan which showed a high-grade right carotid stenosis with scattered punctate focal infarcts within the right middle cerebral artery territory. Her operation was uncomplicated. She is back today for followup. She is not had any new neurologic symptoms. Since last saw her she has undergone lumbar fusion.  Past Medical History  Diagnosis Date  . Hyperlipidemia   . Hypertension   . Clotting disorder   . Stroke   . Arthritis   . Depression   . Reflux   . Leg pain     with walking  . Osteoporosis   . Carotid artery occlusion     Past Surgical History  Procedure Date  . Total knee arthroplasty     right  and left  . Shoulder surgery     right  . Appendectomy   . Breast surgery     x2  . Abdominal hysterectomy   . Carotid endarterectomy 11/11/2010    Right CEA  . Spine surgery 07-11-11    Decomp. Laminectomy, fusion of L4-5,L5-S1 by Dr. Wynetta Emery    History   Social History  . Marital Status: Widowed    Spouse Name: N/A    Number of Children: N/A  . Years of Education: N/A   Occupational History  . Not on file.   Social History Main Topics  . Smoking status: Never Smoker   . Smokeless tobacco: Not on file  . Alcohol Use: No  . Drug Use: No  . Sexually Active: Not on file   Other Topics Concern  . Not on file   Social History Narrative  . No narrative on file    Family History  Problem  Relation Age of Onset  . Clotting disorder Mother   . Heart disease Father 42  . Heart disease Brother   . Stroke Maternal Grandmother     Allergies as of 10/10/2011  . (No Known Allergies)    Current Outpatient Prescriptions on File Prior to Visit  Medication Sig Dispense Refill  . amitriptyline (ELAVIL) 75 MG tablet Take by mouth at bedtime.        . citalopram (CELEXA) 40 MG tablet Take 40 mg by mouth daily.        Marland Kitchen oxyCODONE-acetaminophen (PERCOCET) 5-325 MG per tablet Take 1 tablet by mouth every 4 (four) hours as needed.        . pravastatin (PRAVACHOL) 20 MG tablet Take 20 mg by mouth daily.        Marland Kitchen warfarin (COUMADIN) 5 MG tablet Take 5 mg by mouth as directed.        . clopidogrel (PLAVIX) 75 MG tablet Take 75 mg by mouth daily.           REVIEW OF SYSTEMS: No new changes his previous visit with the exception of lumbar back surgery.  PHYSICAL EXAMINATION:   Vital signs  are BP 165/107  Pulse 62  Resp 16  Ht 5\' 3"  (1.6 m)  Wt 162 lb (73.483 kg)  BMI 28.70 kg/m2  SpO2 98% General: The patient appears their stated age. HEENT:  No gross abnormalities Pulmonary:  Non labored breathing Abdomen: Soft and non-tender Musculoskeletal: There are no major deformities. Neurologic: No focal weakness or paresthesias are detected, Skin: There are no ulcer or rashes noted. Psychiatric: The patient has normal affect. Cardiovascular: Right carotid incision is well-healed. She has palpable radial pulses bilaterally   Diagnostic Studies Carotid duplex was ordered and reviewed today this shows 40-59% stenosis within the right carotid artery the left side is in the 1-39% range. Bilateral vertebral arteries are within normal limits.  Assessment: Status post right carotid endarterectomy in the setting of a symptomatic stenosis Plan: The patient is doing very well at this time. Because of the elevated velocities within her right carotid artery L. schedule her to come back in 6  months for repeat ultrasound. She will continue with antiplatelet therapy.  Jorge Ny, M.D. [facility] [place] Office: [phone] Pager:  [phone]

## 2011-10-17 NOTE — Procedures (Unsigned)
CAROTID DUPLEX EXAM  INDICATION:  Status post right CEA.  HISTORY: Diabetes:  No Cardiac:  No Hypertension:  Yes Smoking:  No Previous Surgery:  Right CEA 11/11/2010 CV History: Amaurosis Fugax No, Paresthesias No, Hemiparesis No                                      RIGHT             LEFT Brachial systolic pressure:         160               162 Brachial Doppler waveforms:         WNL               WNL Vertebral direction of flow:        Antegrade         Antegrade DUPLEX VELOCITIES (cm/sec) CCA peak systolic                   89                101 ECA peak systolic                   110               81 ICA peak systolic                   203               117 ICA end diastolic                   57                29 PLAQUE MORPHOLOGY:                  Soft              Heterogeneous PLAQUE AMOUNT:                      Moderate          Minimal PLAQUE LOCATION:                    CCA, ICA          ICA  IMPRESSION: 1. Soft plaquing of the right carotid endarterectomy resulting in     approximately 40%-59% stenosis. 2. 1%-39% left internal carotid artery plaquing is observed. 3. Bilateral vertebral arteries are within normal limits. 4. No previous study on the right for post intervention comparison.  ___________________________________________ V. Charlena Cross, MD  LT/MEDQ  D:  10/11/2011  T:  10/11/2011  Job:  [id number]

## 2012-04-09 ENCOUNTER — Other Ambulatory Visit: Payer: Medicare Other

## 2012-10-05 ENCOUNTER — Encounter: Payer: Self-pay | Admitting: Surgery

## 2012-10-08 ENCOUNTER — Encounter: Payer: Self-pay | Admitting: Surgery

## 2012-10-08 ENCOUNTER — Other Ambulatory Visit (INDEPENDENT_AMBULATORY_CARE_PROVIDER_SITE_OTHER): Payer: Medicare Other | Admitting: *Deleted

## 2012-10-08 ENCOUNTER — Ambulatory Visit (INDEPENDENT_AMBULATORY_CARE_PROVIDER_SITE_OTHER): Payer: Medicare Other | Admitting: Surgery

## 2012-10-08 VITALS — BP 183/91 | HR 70 | Ht 63.0 in | Wt 169.9 lb

## 2012-10-08 DIAGNOSIS — I6529 Occlusion and stenosis of unspecified carotid artery: Secondary | ICD-10-CM

## 2012-10-08 DIAGNOSIS — Z48812 Encounter for surgical aftercare following surgery on the circulatory system: Secondary | ICD-10-CM

## 2012-10-08 HISTORY — DX: Occlusion and stenosis of unspecified carotid artery: I65.29

## 2012-10-08 NOTE — Progress Notes (Signed)
[facility] [place]   Patient name: Tina Dominguez MRN: [id number] DOB: 12-03-1940 Sex: female     Chief Complaint  Patient presents with  . Carotid    1 yr f/u     HISTORY OF PRESENT ILLNESS: The patient is back today for followup. She is status post right carotid endarterectomy for symptomatic right carotid stenosis. She suffered a right brain stroke prior to her operation. Her only residual symptoms were numbness in her left fifth finger. She has not had any symptoms since her operation. Unfortunately, since I last saw her she tripped over the coffee table and broke her right wrist. In addition she is undergone lower back surgery. She denies any neurologic symptoms. Specifically she denies numbness or weakness in either extremity. She denies slurred speech. She denies amaurosis fugax.  Past Medical History  Diagnosis Date  . Hyperlipidemia   . Hypertension   . Clotting disorder   . Stroke   . Arthritis   . Depression   . Reflux   . Leg pain     with walking  . Osteoporosis   . Carotid artery occlusion     Past Surgical History  Procedure Date  . Total knee arthroplasty     right  and left  . Shoulder surgery     right  . Appendectomy   . Breast surgery     x2  . Abdominal hysterectomy   . Carotid endarterectomy 11/11/2010    Right CEA  . Spine surgery 07-11-11    Decomp. Laminectomy, fusion of L4-5,L5-S1 by Dr. Wynetta Emery  . Back surgery 06/2011    History   Social History  . Marital Status: Widowed    Spouse Name: N/A    Number of Children: N/A  . Years of Education: N/A   Occupational History  . Not on file.   Social History Main Topics  . Smoking status: Never Smoker   . Smokeless tobacco: Never Used  . Alcohol Use: No  . Drug Use: No  . Sexually Active: Not on file   Other Topics Concern  . Not on file   Social History Narrative  . No narrative on file    Family History  Problem Relation Age of Onset  . Clotting  disorder Mother   . Heart disease Father 42  . Heart disease Brother   . Stroke Maternal Grandmother     Allergies as of 10/08/2012  . (No Known Allergies)    Current Outpatient Prescriptions on File Prior to Visit  Medication Sig Dispense Refill  . amitriptyline (ELAVIL) 75 MG tablet Take by mouth at bedtime.        . citalopram (CELEXA) 40 MG tablet Take 40 mg by mouth daily.        Marland Kitchen oxyCODONE-acetaminophen (PERCOCET) 5-325 MG per tablet Take 1 tablet by mouth every 4 (four) hours as needed.        . pravastatin (PRAVACHOL) 20 MG tablet Take 20 mg by mouth daily.        Marland Kitchen warfarin (COUMADIN) 5 MG tablet Take 5 mg by mouth as directed.        . clopidogrel (PLAVIX) 75 MG tablet Take 75 mg by mouth daily.           REVIEW OF SYSTEMS: Other than what's mentioned in the history of present illness, no changes from prior visit  PHYSICAL EXAMINATION:   Vital signs are BP 183/91  Pulse 70  Ht 5\' 3"  (1.6  m)  Wt 169 lb 14.4 oz (77.066 kg)  BMI 30.10 kg/m2  SpO2 100% General: The patient appears their stated age. HEENT:  No gross abnormalities Pulmonary:  Non labored breathing Abdomen: Soft and non-tender no aneurysm felt. Musculoskeletal: There are no major deformities. Neurologic: No focal weakness or paresthesias are detected, Skin: There are no ulcer or rashes noted. Psychiatric: The patient has normal affect. Cardiovascular: There is a regular rate and rhythm without significant murmur appreciated. No carotid bruits. Palpable pedal pulses.   Diagnostic Studies Carotid duplex today was reviewed. This again shows 40-59% soft plaque at the distal patch on the right. These numbers are slightly less than they were one year ago. There is 1-39% stenosis on the left  Assessment: Status post right carotid endarterectomy. Plan: The patient continues to do very well. She remained asymptomatic. I will continue to follow the recurrent stenosis at the distal patch by ultrasound. Her  next imaging study will be in one year. I discussed that the indications for pre-intervention would be a stenosis greater than 80%, or the occurrence of symptoms.  Jorge Ny, M.D. [facility] [place] Office: [phone] Pager:  [phone]

## 2012-10-08 NOTE — Addendum Note (Signed)
Addended by: Dannielle Karvonen on: 10/08/2012 02:21 PM   Modules accepted: Orders

## 2013-10-11 ENCOUNTER — Encounter: Payer: Self-pay | Admitting: Family

## 2013-10-14 ENCOUNTER — Encounter (INDEPENDENT_AMBULATORY_CARE_PROVIDER_SITE_OTHER): Payer: Self-pay

## 2013-10-14 ENCOUNTER — Ambulatory Visit (HOSPITAL_COMMUNITY)
Admission: RE | Admit: 2013-10-14 | Discharge: 2013-10-14 | Disposition: A | Discharge status: Home / Self Care | Payer: Medicare HMO | Source: Ambulatory Visit | Attending: Family | Admitting: Family

## 2013-10-14 ENCOUNTER — Ambulatory Visit (INDEPENDENT_AMBULATORY_CARE_PROVIDER_SITE_OTHER): Payer: Commercial Managed Care - HMO | Admitting: Family

## 2013-10-14 ENCOUNTER — Encounter: Payer: Self-pay | Admitting: Family

## 2013-10-14 VITALS — BP 154/92 | HR 60 | Resp 14 | Ht 63.0 in | Wt 173.0 lb

## 2013-10-14 DIAGNOSIS — Z48812 Encounter for surgical aftercare following surgery on the circulatory system: Secondary | ICD-10-CM

## 2013-10-14 DIAGNOSIS — I6529 Occlusion and stenosis of unspecified carotid artery: Secondary | ICD-10-CM

## 2013-10-14 DIAGNOSIS — I658 Occlusion and stenosis of other precerebral arteries: Secondary | ICD-10-CM | POA: Insufficient documentation

## 2013-10-14 HISTORY — DX: Encounter for surgical aftercare following surgery on the circulatory system: Z48.812

## 2013-10-14 NOTE — Progress Notes (Signed)
Established Carotid Patient   History of Present Illness  Tina Dominguez is a 73 y.o. female patient of Dr. Trula Slade who is status post right carotid endarterectomy in 2009 for symptomatic right carotid stenosis. She suffered a right brain stroke prior to her operation. Her only residual symptoms were numbness in her left fifth finger, no weakness. She returns today for follow up. She denies having any further stroke or TIA symptoms since the CEA. Denies symptoms referable to claudication, but does have varicose veins with associated aching on standing for long periods, denies non-healing wounds.    Pt  reports New Medical or Surgical History: falling and needing craniotomy at [facility] for what sounds like evacuation of hematoma. 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  Pt Diabetic: No Pt smoker: non-smoker Denies ETOH use.  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 Hyperlipidemia   . Hypertension   . Clotting disorder   . Arthritis   . Depression   . Reflux   . Leg pain     with walking  . Osteoporosis   . Carotid artery occlusion   . Stroke     X's 4    Social History History  Substance Use Topics  . Smoking status: Never Smoker   . Smokeless tobacco: Never Used  . Alcohol Use: No    Family History Family History  Problem Relation Age of Onset  . Clotting disorder Mother   . Deep vein thrombosis Mother   . Diabetes Mother   . Heart disease Mother   . Hyperlipidemia Mother   . Hypertension Mother   . Heart disease Father 83  . Varicose Veins Father   . Heart disease Brother   . Stroke Maternal Grandmother   . Deep vein thrombosis Sister   . Diabetes Sister     Surgical History Past Surgical History  Procedure Laterality Date  .  Total knee arthroplasty      right  and left  . Shoulder surgery      right  . Appendectomy    . Breast surgery      x2  . Carotid endarterectomy  11/11/2010    Right CEA  . Back surgery  06/2011  . Spine surgery  07-11-11    Decomp. Laminectomy, fusion of L4-5,L5-S1 by Dr. Saintclair Halsted  . Joint replacement Right 2005    Right Total Knee  . Joint replacement Left 2005    Left Total Knee  . Abdominal hysterectomy  1980    TAH     No Known Allergies  Current Outpatient Prescriptions  Medication Sig Dispense Refill  . citalopram (CELEXA) 40 MG tablet Take 40 mg by mouth daily.        Marland Kitchen oxyCODONE-acetaminophen (PERCOCET) 5-325 MG per tablet Take 1 tablet by mouth every 4 (four) hours as needed. 10-325 One Tab every 12 hours      . warfarin (COUMADIN) 5 MG tablet Take 5 mg by mouth as directed.        Marland Kitchen amitriptyline (ELAVIL) 75 MG tablet Take by mouth at bedtime.        . clopidogrel (PLAVIX) 75 MG tablet Take 75 mg by mouth daily.        . pravastatin (PRAVACHOL) 20 MG tablet Take 20 mg by mouth  daily.         No current facility-administered medications for this visit.    Review of Systems : See HPI for pertinent positives and negatives.  Physical Examination  Filed Vitals:   10/14/13 1204  BP: 154/92  Pulse: 60  Resp: 14   Filed Weights   10/14/13 1204  Weight: 173 lb (78.472 kg)   Body mass index is 30.65 kg/(m^2).   General: WDWN obese female in NAD GAIT: normal Eyes: PERRLA Pulmonary:  CTAB, Negative  Rales, Negative rhonchi, & Negative wheezing.  Cardiac: regular Rhythm ,  No detected Murmur.  VASCULAR EXAM Carotid Bruits Left Right   Negative Negative    Aorta is not palpable. Radial pulses are 2+ palpable and equal.                                                                                                                            LE Pulses LEFT RIGHT       POPLITEAL  not palpable   not palpable       POSTERIOR TIBIAL  not palpable   not  palpable        DORSALIS PEDIS      ANTERIOR TIBIAL  palpable   palpable     Gastrointestinal: soft, nontender, BS WNL, no r/g,  negative masses.  Musculoskeletal: Negative muscle atrophy/wasting. M/S 4/5 throughout, Extremities without ischemic changes.  Neurologic: A&O X 3; Appropriate Affect ; SENSATION ;normal;  Speech is normal CN 2-12 intact, Pain and light touch intact in extremities, Motor exam as listed above.   Non-Invasive Vascular Imaging CAROTID DUPLEX 10/14/2013   Patent right internal carotid artery with history of carotid endarterectomy, mild to moderate hyperplasia with elevated velocities suggestive of 40%-59% stenosis present. Left internal carotid artery stenosis present of less than 40%.    Compared to the previous exam:  Unchanged since previous study on 10/08/2012.     Assessment: Tina Dominguez is a 73 y.o. female who  is status post right carotid endarterectomy in 2009 for symptomatic right carotid stenosis. The right ICA, which is the operative side, remains mild-moderately stenosed and left ICA remains minimally stenosed.  The  ICA stenosis is  Unchanged from previous exam.  Plan: Follow-up in 1 year with Carotid Duplex scan.   I discussed in depth with the patient the nature of atherosclerosis, and emphasized the importance of maximal medical management including strict control of blood pressure, blood glucose, and lipid levels, obtaining regular exercise, and continued cessation of smoking.  The patient is aware that without maximal medical management the underlying atherosclerotic disease process will progress, limiting the benefit of any interventions. The patient was given information about stroke prevention and what symptoms should prompt the patient to seek immediate medical care. Thank you for allowing [place] to participate in this patient's care.  Clemon Chambers, RN, MSN, FNP-C [facility] [place] Office:  [phone]  Clinic Physician: Trula Slade  10/14/2013 12:26  PM

## 2013-10-14 NOTE — Patient Instructions (Signed)

## 2014-10-10 ENCOUNTER — Encounter: Payer: Self-pay | Admitting: Family

## 2014-10-13 ENCOUNTER — Other Ambulatory Visit (HOSPITAL_COMMUNITY): Payer: Medicare HMO

## 2014-10-13 ENCOUNTER — Ambulatory Visit: Payer: Medicare HMO | Admitting: Family

## 2016-10-21 DIAGNOSIS — I10 Essential (primary) hypertension: Secondary | ICD-10-CM | POA: Diagnosis not present

## 2016-10-21 DIAGNOSIS — G894 Chronic pain syndrome: Secondary | ICD-10-CM | POA: Diagnosis not present

## 2016-10-21 DIAGNOSIS — M064 Inflammatory polyarthropathy: Secondary | ICD-10-CM | POA: Diagnosis not present

## 2016-10-21 DIAGNOSIS — Z7901 Long term (current) use of anticoagulants: Secondary | ICD-10-CM | POA: Diagnosis not present

## 2016-11-16 DIAGNOSIS — M25562 Pain in left knee: Secondary | ICD-10-CM | POA: Diagnosis not present

## 2016-11-18 DIAGNOSIS — M064 Inflammatory polyarthropathy: Secondary | ICD-10-CM | POA: Diagnosis not present

## 2016-11-18 DIAGNOSIS — Z7901 Long term (current) use of anticoagulants: Secondary | ICD-10-CM | POA: Diagnosis not present

## 2016-11-18 DIAGNOSIS — Z86718 Personal history of other venous thrombosis and embolism: Secondary | ICD-10-CM | POA: Diagnosis not present

## 2016-11-18 DIAGNOSIS — G894 Chronic pain syndrome: Secondary | ICD-10-CM | POA: Diagnosis not present

## 2016-11-18 DIAGNOSIS — D6851 Activated protein C resistance: Secondary | ICD-10-CM | POA: Diagnosis not present

## 2016-11-21 ENCOUNTER — Other Ambulatory Visit (HOSPITAL_COMMUNITY): Payer: Self-pay | Admitting: Orthopedic Surgery

## 2016-11-21 DIAGNOSIS — Z96659 Presence of unspecified artificial knee joint: Principal | ICD-10-CM

## 2016-11-21 DIAGNOSIS — T84038A Mechanical loosening of other internal prosthetic joint, initial encounter: Secondary | ICD-10-CM

## 2016-11-21 DIAGNOSIS — M25562 Pain in left knee: Secondary | ICD-10-CM | POA: Diagnosis not present

## 2016-11-24 DIAGNOSIS — E782 Mixed hyperlipidemia: Secondary | ICD-10-CM | POA: Diagnosis not present

## 2016-11-24 DIAGNOSIS — I10 Essential (primary) hypertension: Secondary | ICD-10-CM | POA: Diagnosis not present

## 2016-11-24 DIAGNOSIS — E038 Other specified hypothyroidism: Secondary | ICD-10-CM | POA: Diagnosis not present

## 2016-11-28 ENCOUNTER — Encounter (HOSPITAL_COMMUNITY): Payer: Medicare HMO

## 2016-11-28 ENCOUNTER — Encounter (HOSPITAL_COMMUNITY): Payer: Self-pay

## 2016-12-22 DIAGNOSIS — Z7901 Long term (current) use of anticoagulants: Secondary | ICD-10-CM | POA: Diagnosis not present

## 2016-12-22 DIAGNOSIS — G894 Chronic pain syndrome: Secondary | ICD-10-CM | POA: Diagnosis not present

## 2016-12-22 DIAGNOSIS — M064 Inflammatory polyarthropathy: Secondary | ICD-10-CM | POA: Diagnosis not present

## 2016-12-22 DIAGNOSIS — Z86718 Personal history of other venous thrombosis and embolism: Secondary | ICD-10-CM | POA: Diagnosis not present

## 2016-12-22 DIAGNOSIS — D6851 Activated protein C resistance: Secondary | ICD-10-CM | POA: Diagnosis not present

## 2016-12-30 DIAGNOSIS — E782 Mixed hyperlipidemia: Secondary | ICD-10-CM | POA: Diagnosis not present

## 2016-12-30 DIAGNOSIS — I10 Essential (primary) hypertension: Secondary | ICD-10-CM | POA: Diagnosis not present

## 2016-12-30 DIAGNOSIS — E038 Other specified hypothyroidism: Secondary | ICD-10-CM | POA: Diagnosis not present

## 2017-01-23 DIAGNOSIS — D6851 Activated protein C resistance: Secondary | ICD-10-CM | POA: Diagnosis not present

## 2017-01-23 DIAGNOSIS — E038 Other specified hypothyroidism: Secondary | ICD-10-CM | POA: Diagnosis not present

## 2017-01-23 DIAGNOSIS — E782 Mixed hyperlipidemia: Secondary | ICD-10-CM | POA: Diagnosis not present

## 2017-01-23 DIAGNOSIS — E569 Vitamin deficiency, unspecified: Secondary | ICD-10-CM | POA: Diagnosis not present

## 2017-01-23 DIAGNOSIS — E611 Iron deficiency: Secondary | ICD-10-CM | POA: Diagnosis not present

## 2017-01-23 DIAGNOSIS — Z7901 Long term (current) use of anticoagulants: Secondary | ICD-10-CM | POA: Diagnosis not present

## 2017-01-23 DIAGNOSIS — M064 Inflammatory polyarthropathy: Secondary | ICD-10-CM | POA: Diagnosis not present

## 2017-01-23 DIAGNOSIS — G894 Chronic pain syndrome: Secondary | ICD-10-CM | POA: Diagnosis not present

## 2017-01-23 DIAGNOSIS — R7301 Impaired fasting glucose: Secondary | ICD-10-CM | POA: Diagnosis not present

## 2017-01-30 DIAGNOSIS — E782 Mixed hyperlipidemia: Secondary | ICD-10-CM | POA: Diagnosis not present

## 2017-01-30 DIAGNOSIS — E038 Other specified hypothyroidism: Secondary | ICD-10-CM | POA: Diagnosis not present

## 2017-01-30 DIAGNOSIS — I10 Essential (primary) hypertension: Secondary | ICD-10-CM | POA: Diagnosis not present

## 2017-02-21 DIAGNOSIS — G894 Chronic pain syndrome: Secondary | ICD-10-CM | POA: Diagnosis not present

## 2017-02-21 DIAGNOSIS — Z86718 Personal history of other venous thrombosis and embolism: Secondary | ICD-10-CM | POA: Diagnosis not present

## 2017-02-21 DIAGNOSIS — Z7901 Long term (current) use of anticoagulants: Secondary | ICD-10-CM | POA: Diagnosis not present

## 2017-02-21 DIAGNOSIS — D6851 Activated protein C resistance: Secondary | ICD-10-CM | POA: Diagnosis not present

## 2017-03-02 DIAGNOSIS — E782 Mixed hyperlipidemia: Secondary | ICD-10-CM | POA: Diagnosis not present

## 2017-03-02 DIAGNOSIS — E038 Other specified hypothyroidism: Secondary | ICD-10-CM | POA: Diagnosis not present

## 2017-03-02 DIAGNOSIS — I10 Essential (primary) hypertension: Secondary | ICD-10-CM | POA: Diagnosis not present

## 2017-03-24 DIAGNOSIS — Z7901 Long term (current) use of anticoagulants: Secondary | ICD-10-CM | POA: Diagnosis not present

## 2017-03-24 DIAGNOSIS — R634 Abnormal weight loss: Secondary | ICD-10-CM | POA: Diagnosis not present

## 2017-03-24 DIAGNOSIS — G894 Chronic pain syndrome: Secondary | ICD-10-CM | POA: Diagnosis not present

## 2017-03-24 DIAGNOSIS — R1111 Vomiting without nausea: Secondary | ICD-10-CM | POA: Diagnosis not present

## 2017-03-24 DIAGNOSIS — Z86718 Personal history of other venous thrombosis and embolism: Secondary | ICD-10-CM | POA: Diagnosis not present

## 2017-03-24 DIAGNOSIS — R1319 Other dysphagia: Secondary | ICD-10-CM | POA: Diagnosis not present

## 2017-04-05 DIAGNOSIS — R1310 Dysphagia, unspecified: Secondary | ICD-10-CM | POA: Diagnosis not present

## 2017-04-05 DIAGNOSIS — K219 Gastro-esophageal reflux disease without esophagitis: Secondary | ICD-10-CM | POA: Diagnosis not present

## 2017-04-07 DIAGNOSIS — K224 Dyskinesia of esophagus: Secondary | ICD-10-CM | POA: Diagnosis not present

## 2017-04-07 DIAGNOSIS — R1312 Dysphagia, oropharyngeal phase: Secondary | ICD-10-CM | POA: Diagnosis not present

## 2017-04-07 DIAGNOSIS — R1310 Dysphagia, unspecified: Secondary | ICD-10-CM | POA: Diagnosis not present

## 2017-04-07 DIAGNOSIS — K449 Diaphragmatic hernia without obstruction or gangrene: Secondary | ICD-10-CM | POA: Diagnosis not present

## 2017-04-17 DIAGNOSIS — D6851 Activated protein C resistance: Secondary | ICD-10-CM | POA: Diagnosis not present

## 2017-04-17 DIAGNOSIS — Z86718 Personal history of other venous thrombosis and embolism: Secondary | ICD-10-CM

## 2017-04-17 DIAGNOSIS — R1310 Dysphagia, unspecified: Secondary | ICD-10-CM

## 2017-04-17 DIAGNOSIS — Z9889 Other specified postprocedural states: Secondary | ICD-10-CM | POA: Diagnosis not present

## 2017-04-17 DIAGNOSIS — I10 Essential (primary) hypertension: Secondary | ICD-10-CM | POA: Diagnosis not present

## 2017-04-17 DIAGNOSIS — I2699 Other pulmonary embolism without acute cor pulmonale: Secondary | ICD-10-CM

## 2017-04-17 DIAGNOSIS — K449 Diaphragmatic hernia without obstruction or gangrene: Secondary | ICD-10-CM | POA: Diagnosis not present

## 2017-04-17 HISTORY — DX: Dysphagia, unspecified: R13.10

## 2017-04-17 HISTORY — DX: Personal history of other venous thrombosis and embolism: Z86.718

## 2017-04-17 HISTORY — DX: Other pulmonary embolism without acute cor pulmonale: I26.99

## 2017-04-21 DIAGNOSIS — Z7901 Long term (current) use of anticoagulants: Secondary | ICD-10-CM | POA: Diagnosis not present

## 2017-04-21 DIAGNOSIS — M064 Inflammatory polyarthropathy: Secondary | ICD-10-CM | POA: Diagnosis not present

## 2017-04-21 DIAGNOSIS — Z86718 Personal history of other venous thrombosis and embolism: Secondary | ICD-10-CM | POA: Diagnosis not present

## 2017-04-21 DIAGNOSIS — G894 Chronic pain syndrome: Secondary | ICD-10-CM | POA: Diagnosis not present

## 2017-04-24 DIAGNOSIS — D6851 Activated protein C resistance: Secondary | ICD-10-CM | POA: Diagnosis not present

## 2017-04-24 DIAGNOSIS — I2699 Other pulmonary embolism without acute cor pulmonale: Secondary | ICD-10-CM | POA: Diagnosis not present

## 2017-04-24 DIAGNOSIS — K449 Diaphragmatic hernia without obstruction or gangrene: Secondary | ICD-10-CM | POA: Diagnosis not present

## 2017-05-08 DIAGNOSIS — R1310 Dysphagia, unspecified: Secondary | ICD-10-CM | POA: Diagnosis not present

## 2017-05-19 DIAGNOSIS — K449 Diaphragmatic hernia without obstruction or gangrene: Secondary | ICD-10-CM | POA: Diagnosis not present

## 2017-05-19 DIAGNOSIS — K222 Esophageal obstruction: Secondary | ICD-10-CM | POA: Diagnosis not present

## 2017-05-19 DIAGNOSIS — K3189 Other diseases of stomach and duodenum: Secondary | ICD-10-CM | POA: Diagnosis not present

## 2017-05-19 DIAGNOSIS — K315 Obstruction of duodenum: Secondary | ICD-10-CM | POA: Diagnosis not present

## 2017-05-22 DIAGNOSIS — K2930 Chronic superficial gastritis without bleeding: Secondary | ICD-10-CM | POA: Diagnosis not present

## 2017-05-22 DIAGNOSIS — K222 Esophageal obstruction: Secondary | ICD-10-CM | POA: Diagnosis not present

## 2017-05-22 DIAGNOSIS — K315 Obstruction of duodenum: Secondary | ICD-10-CM | POA: Diagnosis not present

## 2017-05-22 DIAGNOSIS — K2980 Duodenitis without bleeding: Secondary | ICD-10-CM | POA: Diagnosis not present

## 2017-05-22 DIAGNOSIS — Z8673 Personal history of transient ischemic attack (TIA), and cerebral infarction without residual deficits: Secondary | ICD-10-CM | POA: Diagnosis not present

## 2017-05-22 DIAGNOSIS — I10 Essential (primary) hypertension: Secondary | ICD-10-CM | POA: Diagnosis not present

## 2017-05-22 DIAGNOSIS — K210 Gastro-esophageal reflux disease with esophagitis: Secondary | ICD-10-CM | POA: Diagnosis not present

## 2017-05-22 DIAGNOSIS — R1310 Dysphagia, unspecified: Secondary | ICD-10-CM | POA: Diagnosis not present

## 2017-05-23 DIAGNOSIS — M064 Inflammatory polyarthropathy: Secondary | ICD-10-CM | POA: Diagnosis not present

## 2017-05-23 DIAGNOSIS — G894 Chronic pain syndrome: Secondary | ICD-10-CM | POA: Diagnosis not present

## 2017-05-23 DIAGNOSIS — I10 Essential (primary) hypertension: Secondary | ICD-10-CM | POA: Diagnosis not present

## 2017-05-23 DIAGNOSIS — Z7901 Long term (current) use of anticoagulants: Secondary | ICD-10-CM | POA: Diagnosis not present

## 2017-05-23 DIAGNOSIS — Z86718 Personal history of other venous thrombosis and embolism: Secondary | ICD-10-CM | POA: Diagnosis not present

## 2017-05-26 DIAGNOSIS — Z7901 Long term (current) use of anticoagulants: Secondary | ICD-10-CM | POA: Diagnosis not present

## 2017-05-30 DIAGNOSIS — Z7901 Long term (current) use of anticoagulants: Secondary | ICD-10-CM | POA: Diagnosis not present

## 2017-06-06 DIAGNOSIS — Z7901 Long term (current) use of anticoagulants: Secondary | ICD-10-CM | POA: Diagnosis not present

## 2017-06-20 DIAGNOSIS — Z86718 Personal history of other venous thrombosis and embolism: Secondary | ICD-10-CM | POA: Diagnosis not present

## 2017-06-20 DIAGNOSIS — D6851 Activated protein C resistance: Secondary | ICD-10-CM | POA: Diagnosis not present

## 2017-06-20 DIAGNOSIS — G894 Chronic pain syndrome: Secondary | ICD-10-CM | POA: Diagnosis not present

## 2017-06-20 DIAGNOSIS — Z7901 Long term (current) use of anticoagulants: Secondary | ICD-10-CM | POA: Diagnosis not present

## 2017-07-20 DIAGNOSIS — D6851 Activated protein C resistance: Secondary | ICD-10-CM | POA: Diagnosis not present

## 2017-07-20 DIAGNOSIS — Z86718 Personal history of other venous thrombosis and embolism: Secondary | ICD-10-CM | POA: Diagnosis not present

## 2017-07-20 DIAGNOSIS — G894 Chronic pain syndrome: Secondary | ICD-10-CM | POA: Diagnosis not present

## 2017-07-20 DIAGNOSIS — Z7901 Long term (current) use of anticoagulants: Secondary | ICD-10-CM | POA: Diagnosis not present

## 2017-07-20 DIAGNOSIS — Z23 Encounter for immunization: Secondary | ICD-10-CM | POA: Diagnosis not present

## 2017-08-21 DIAGNOSIS — D6851 Activated protein C resistance: Secondary | ICD-10-CM | POA: Diagnosis not present

## 2017-08-21 DIAGNOSIS — G894 Chronic pain syndrome: Secondary | ICD-10-CM | POA: Diagnosis not present

## 2017-08-21 DIAGNOSIS — M5416 Radiculopathy, lumbar region: Secondary | ICD-10-CM | POA: Diagnosis not present

## 2017-08-21 DIAGNOSIS — M170 Bilateral primary osteoarthritis of knee: Secondary | ICD-10-CM | POA: Diagnosis not present

## 2017-08-21 DIAGNOSIS — Z7901 Long term (current) use of anticoagulants: Secondary | ICD-10-CM | POA: Diagnosis not present

## 2017-08-21 DIAGNOSIS — E038 Other specified hypothyroidism: Secondary | ICD-10-CM | POA: Diagnosis not present

## 2017-09-16 DIAGNOSIS — R791 Abnormal coagulation profile: Secondary | ICD-10-CM | POA: Diagnosis not present

## 2017-09-16 DIAGNOSIS — N390 Urinary tract infection, site not specified: Secondary | ICD-10-CM | POA: Diagnosis not present

## 2017-09-16 DIAGNOSIS — Z7901 Long term (current) use of anticoagulants: Secondary | ICD-10-CM | POA: Diagnosis not present

## 2017-09-16 DIAGNOSIS — S299XXA Unspecified injury of thorax, initial encounter: Secondary | ICD-10-CM | POA: Diagnosis not present

## 2017-09-16 DIAGNOSIS — K8020 Calculus of gallbladder without cholecystitis without obstruction: Secondary | ICD-10-CM | POA: Diagnosis not present

## 2017-09-16 DIAGNOSIS — S3991XA Unspecified injury of abdomen, initial encounter: Secondary | ICD-10-CM | POA: Diagnosis not present

## 2017-09-16 DIAGNOSIS — D6851 Activated protein C resistance: Secondary | ICD-10-CM | POA: Diagnosis not present

## 2017-09-16 DIAGNOSIS — K5730 Diverticulosis of large intestine without perforation or abscess without bleeding: Secondary | ICD-10-CM | POA: Diagnosis not present

## 2017-09-21 DIAGNOSIS — Z7901 Long term (current) use of anticoagulants: Secondary | ICD-10-CM | POA: Diagnosis not present

## 2017-09-21 DIAGNOSIS — E782 Mixed hyperlipidemia: Secondary | ICD-10-CM | POA: Diagnosis not present

## 2017-09-21 DIAGNOSIS — I10 Essential (primary) hypertension: Secondary | ICD-10-CM | POA: Diagnosis not present

## 2017-09-21 DIAGNOSIS — D6851 Activated protein C resistance: Secondary | ICD-10-CM | POA: Diagnosis not present

## 2017-09-21 DIAGNOSIS — G894 Chronic pain syndrome: Secondary | ICD-10-CM | POA: Diagnosis not present

## 2017-10-04 DIAGNOSIS — E785 Hyperlipidemia, unspecified: Secondary | ICD-10-CM | POA: Diagnosis not present

## 2017-10-04 DIAGNOSIS — Z86718 Personal history of other venous thrombosis and embolism: Secondary | ICD-10-CM | POA: Diagnosis not present

## 2017-10-04 DIAGNOSIS — K222 Esophageal obstruction: Secondary | ICD-10-CM | POA: Diagnosis not present

## 2017-10-04 DIAGNOSIS — K269 Duodenal ulcer, unspecified as acute or chronic, without hemorrhage or perforation: Secondary | ICD-10-CM | POA: Diagnosis not present

## 2017-10-04 DIAGNOSIS — K250 Acute gastric ulcer with hemorrhage: Secondary | ICD-10-CM | POA: Diagnosis not present

## 2017-10-04 DIAGNOSIS — F329 Major depressive disorder, single episode, unspecified: Secondary | ICD-10-CM | POA: Diagnosis not present

## 2017-10-04 DIAGNOSIS — I10 Essential (primary) hypertension: Secondary | ICD-10-CM | POA: Diagnosis not present

## 2017-10-04 DIAGNOSIS — K219 Gastro-esophageal reflux disease without esophagitis: Secondary | ICD-10-CM | POA: Diagnosis not present

## 2017-10-04 DIAGNOSIS — E039 Hypothyroidism, unspecified: Secondary | ICD-10-CM | POA: Diagnosis not present

## 2017-10-04 DIAGNOSIS — R0902 Hypoxemia: Secondary | ICD-10-CM | POA: Diagnosis not present

## 2017-10-04 DIAGNOSIS — G8929 Other chronic pain: Secondary | ICD-10-CM | POA: Diagnosis not present

## 2017-10-04 DIAGNOSIS — Z66 Do not resuscitate: Secondary | ICD-10-CM | POA: Diagnosis not present

## 2017-10-04 DIAGNOSIS — K922 Gastrointestinal hemorrhage, unspecified: Secondary | ICD-10-CM

## 2017-10-04 DIAGNOSIS — K254 Chronic or unspecified gastric ulcer with hemorrhage: Secondary | ICD-10-CM | POA: Diagnosis not present

## 2017-10-04 DIAGNOSIS — I6529 Occlusion and stenosis of unspecified carotid artery: Secondary | ICD-10-CM | POA: Diagnosis not present

## 2017-10-04 DIAGNOSIS — K264 Chronic or unspecified duodenal ulcer with hemorrhage: Secondary | ICD-10-CM | POA: Diagnosis not present

## 2017-10-04 DIAGNOSIS — K921 Melena: Secondary | ICD-10-CM | POA: Diagnosis not present

## 2017-10-04 DIAGNOSIS — Z8673 Personal history of transient ischemic attack (TIA), and cerebral infarction without residual deficits: Secondary | ICD-10-CM | POA: Diagnosis not present

## 2017-10-04 DIAGNOSIS — M549 Dorsalgia, unspecified: Secondary | ICD-10-CM | POA: Diagnosis not present

## 2017-10-04 DIAGNOSIS — I2699 Other pulmonary embolism without acute cor pulmonale: Secondary | ICD-10-CM | POA: Diagnosis not present

## 2017-10-04 DIAGNOSIS — R791 Abnormal coagulation profile: Secondary | ICD-10-CM | POA: Diagnosis not present

## 2017-10-04 DIAGNOSIS — Z7901 Long term (current) use of anticoagulants: Secondary | ICD-10-CM | POA: Diagnosis not present

## 2017-10-04 DIAGNOSIS — R918 Other nonspecific abnormal finding of lung field: Secondary | ICD-10-CM | POA: Diagnosis not present

## 2017-10-04 DIAGNOSIS — D649 Anemia, unspecified: Secondary | ICD-10-CM | POA: Diagnosis not present

## 2017-10-04 DIAGNOSIS — R030 Elevated blood-pressure reading, without diagnosis of hypertension: Secondary | ICD-10-CM | POA: Diagnosis not present

## 2017-10-04 DIAGNOSIS — D689 Coagulation defect, unspecified: Secondary | ICD-10-CM | POA: Diagnosis not present

## 2017-10-04 DIAGNOSIS — R112 Nausea with vomiting, unspecified: Secondary | ICD-10-CM | POA: Diagnosis not present

## 2017-10-04 DIAGNOSIS — R1310 Dysphagia, unspecified: Secondary | ICD-10-CM | POA: Diagnosis not present

## 2017-10-04 DIAGNOSIS — D6851 Activated protein C resistance: Secondary | ICD-10-CM | POA: Diagnosis not present

## 2017-10-04 DIAGNOSIS — R1111 Vomiting without nausea: Secondary | ICD-10-CM | POA: Diagnosis not present

## 2017-10-04 DIAGNOSIS — K920 Hematemesis: Secondary | ICD-10-CM | POA: Diagnosis not present

## 2017-10-04 DIAGNOSIS — K449 Diaphragmatic hernia without obstruction or gangrene: Secondary | ICD-10-CM | POA: Diagnosis not present

## 2017-10-04 DIAGNOSIS — I959 Hypotension, unspecified: Secondary | ICD-10-CM | POA: Diagnosis not present

## 2017-10-04 DIAGNOSIS — M1990 Unspecified osteoarthritis, unspecified site: Secondary | ICD-10-CM | POA: Diagnosis not present

## 2017-10-04 DIAGNOSIS — Z87891 Personal history of nicotine dependence: Secondary | ICD-10-CM | POA: Diagnosis not present

## 2017-10-04 DIAGNOSIS — Z86711 Personal history of pulmonary embolism: Secondary | ICD-10-CM | POA: Diagnosis not present

## 2017-10-04 DIAGNOSIS — N390 Urinary tract infection, site not specified: Secondary | ICD-10-CM | POA: Diagnosis not present

## 2017-10-04 DIAGNOSIS — Z7982 Long term (current) use of aspirin: Secondary | ICD-10-CM | POA: Diagnosis not present

## 2017-10-04 DIAGNOSIS — K315 Obstruction of duodenum: Secondary | ICD-10-CM | POA: Diagnosis not present

## 2017-10-04 HISTORY — DX: Gastrointestinal hemorrhage, unspecified: K92.2

## 2017-10-10 DIAGNOSIS — Z86718 Personal history of other venous thrombosis and embolism: Secondary | ICD-10-CM | POA: Diagnosis not present

## 2017-10-10 DIAGNOSIS — I10 Essential (primary) hypertension: Secondary | ICD-10-CM | POA: Diagnosis not present

## 2017-10-10 DIAGNOSIS — D6851 Activated protein C resistance: Secondary | ICD-10-CM | POA: Diagnosis not present

## 2017-10-10 DIAGNOSIS — M158 Other polyosteoarthritis: Secondary | ICD-10-CM | POA: Diagnosis not present

## 2017-10-11 ENCOUNTER — Other Ambulatory Visit: Payer: Self-pay

## 2017-10-11 NOTE — Patient Outreach (Signed)
[facility]) Care Management  10/11/2017  ANGY SWEARENGIN 03-04-1941 [id number]   Transition of care  Referral date: 10/10/17 Referral source: discharged from [place] at [facility] 1/12/ 19 Insurance: health team advantage Attempt #1  Telephone call to patient regarding transition of care follow up. Contact answering call states patient is not feeling well and request call back at another time.   PLAN: RNCM will attempt 2nd telephone call to patient within  3 business days.   Quinn Plowman RN,BSN,CCM [facility] Telephonic  [phone]

## 2017-10-12 ENCOUNTER — Ambulatory Visit: Payer: Self-pay

## 2017-10-16 ENCOUNTER — Other Ambulatory Visit: Payer: Self-pay

## 2017-10-16 NOTE — Patient Outreach (Signed)
[facility]) Care Management  10/16/2017  Tina Dominguez 1941-08-20 [id number]  Transition of care  Referral date: 10/10/17 Referral source: discharged from [place] at [facility] 1/12/ 19 Insurance: health team advantage   Telephone call to patient regarding transition of care follow up. HIPAA verified with patient. Discussed transition of care call with patient. Patient states she is not having any problems. Patient states she had her follow up appointment with her primary MD approximately 1 week ago. Patient states she is scheduled to have an EGD on 11/07/17.  Patient states she will be scheduled to follow up with the gastroenterologist after her EGD.  Patient refused to review discharge summary or medications with RNCM.  Patient verbalized appreciation of call, stated she did not need anything else and hung up the phone.   PLAN:  RNCM will refer patient to care management assistant to close patient due to patient refusing services.  RNCM will notify patients primary MD of closure.  RNCM will send patient [facility] care management outreach letter/ brochure.   Quinn Plowman RN,BSN,CCM [facility] Telephonic  [phone]

## 2017-10-27 DIAGNOSIS — I10 Essential (primary) hypertension: Secondary | ICD-10-CM | POA: Diagnosis not present

## 2017-10-27 DIAGNOSIS — R0902 Hypoxemia: Secondary | ICD-10-CM | POA: Diagnosis not present

## 2017-10-27 DIAGNOSIS — I739 Peripheral vascular disease, unspecified: Secondary | ICD-10-CM | POA: Diagnosis not present

## 2017-10-27 DIAGNOSIS — R5383 Other fatigue: Secondary | ICD-10-CM | POA: Diagnosis not present

## 2017-10-27 DIAGNOSIS — R0989 Other specified symptoms and signs involving the circulatory and respiratory systems: Secondary | ICD-10-CM | POA: Diagnosis not present

## 2017-10-27 DIAGNOSIS — R0602 Shortness of breath: Secondary | ICD-10-CM | POA: Diagnosis not present

## 2017-10-27 DIAGNOSIS — E038 Other specified hypothyroidism: Secondary | ICD-10-CM | POA: Diagnosis not present

## 2017-10-27 DIAGNOSIS — M158 Other polyosteoarthritis: Secondary | ICD-10-CM | POA: Diagnosis not present

## 2017-10-27 DIAGNOSIS — K449 Diaphragmatic hernia without obstruction or gangrene: Secondary | ICD-10-CM | POA: Diagnosis not present

## 2017-11-08 DIAGNOSIS — J441 Chronic obstructive pulmonary disease with (acute) exacerbation: Secondary | ICD-10-CM | POA: Diagnosis not present

## 2017-11-08 DIAGNOSIS — M170 Bilateral primary osteoarthritis of knee: Secondary | ICD-10-CM | POA: Diagnosis not present

## 2017-11-08 DIAGNOSIS — Z5181 Encounter for therapeutic drug level monitoring: Secondary | ICD-10-CM | POA: Diagnosis not present

## 2017-11-08 DIAGNOSIS — Z86718 Personal history of other venous thrombosis and embolism: Secondary | ICD-10-CM | POA: Diagnosis not present

## 2017-11-08 DIAGNOSIS — M158 Other polyosteoarthritis: Secondary | ICD-10-CM | POA: Diagnosis not present

## 2017-11-09 DIAGNOSIS — J449 Chronic obstructive pulmonary disease, unspecified: Secondary | ICD-10-CM | POA: Diagnosis not present

## 2017-11-09 DIAGNOSIS — R0602 Shortness of breath: Secondary | ICD-10-CM | POA: Diagnosis not present

## 2017-11-14 DIAGNOSIS — I10 Essential (primary) hypertension: Secondary | ICD-10-CM | POA: Diagnosis not present

## 2017-11-14 DIAGNOSIS — K219 Gastro-esophageal reflux disease without esophagitis: Secondary | ICD-10-CM | POA: Diagnosis not present

## 2017-11-14 DIAGNOSIS — K315 Obstruction of duodenum: Secondary | ICD-10-CM | POA: Diagnosis not present

## 2017-11-14 DIAGNOSIS — M1990 Unspecified osteoarthritis, unspecified site: Secondary | ICD-10-CM | POA: Diagnosis not present

## 2017-11-14 DIAGNOSIS — Z8673 Personal history of transient ischemic attack (TIA), and cerebral infarction without residual deficits: Secondary | ICD-10-CM | POA: Diagnosis not present

## 2017-11-14 DIAGNOSIS — K222 Esophageal obstruction: Secondary | ICD-10-CM | POA: Diagnosis not present

## 2017-11-14 DIAGNOSIS — K449 Diaphragmatic hernia without obstruction or gangrene: Secondary | ICD-10-CM | POA: Diagnosis not present

## 2017-11-14 DIAGNOSIS — Z86711 Personal history of pulmonary embolism: Secondary | ICD-10-CM | POA: Diagnosis not present

## 2017-11-14 DIAGNOSIS — E785 Hyperlipidemia, unspecified: Secondary | ICD-10-CM | POA: Diagnosis not present

## 2017-11-14 DIAGNOSIS — Z79899 Other long term (current) drug therapy: Secondary | ICD-10-CM | POA: Diagnosis not present

## 2017-12-06 DIAGNOSIS — E782 Mixed hyperlipidemia: Secondary | ICD-10-CM | POA: Diagnosis not present

## 2017-12-06 DIAGNOSIS — D6851 Activated protein C resistance: Secondary | ICD-10-CM | POA: Diagnosis not present

## 2017-12-06 DIAGNOSIS — I10 Essential (primary) hypertension: Secondary | ICD-10-CM | POA: Diagnosis not present

## 2017-12-06 DIAGNOSIS — Z7901 Long term (current) use of anticoagulants: Secondary | ICD-10-CM | POA: Diagnosis not present

## 2017-12-06 DIAGNOSIS — Z5181 Encounter for therapeutic drug level monitoring: Secondary | ICD-10-CM | POA: Diagnosis not present

## 2017-12-07 DIAGNOSIS — J449 Chronic obstructive pulmonary disease, unspecified: Secondary | ICD-10-CM | POA: Diagnosis not present

## 2017-12-07 DIAGNOSIS — R0602 Shortness of breath: Secondary | ICD-10-CM | POA: Diagnosis not present

## 2017-12-07 DIAGNOSIS — Z79899 Other long term (current) drug therapy: Secondary | ICD-10-CM | POA: Diagnosis not present

## 2017-12-13 DIAGNOSIS — K222 Esophageal obstruction: Secondary | ICD-10-CM

## 2017-12-13 DIAGNOSIS — F419 Anxiety disorder, unspecified: Secondary | ICD-10-CM

## 2017-12-13 DIAGNOSIS — R002 Palpitations: Secondary | ICD-10-CM | POA: Diagnosis not present

## 2017-12-13 DIAGNOSIS — Z8673 Personal history of transient ischemic attack (TIA), and cerebral infarction without residual deficits: Secondary | ICD-10-CM

## 2017-12-13 DIAGNOSIS — N183 Chronic kidney disease, stage 3 unspecified: Secondary | ICD-10-CM | POA: Insufficient documentation

## 2017-12-13 DIAGNOSIS — F418 Other specified anxiety disorders: Secondary | ICD-10-CM | POA: Diagnosis not present

## 2017-12-13 DIAGNOSIS — J849 Interstitial pulmonary disease, unspecified: Secondary | ICD-10-CM | POA: Diagnosis not present

## 2017-12-13 DIAGNOSIS — M109 Gout, unspecified: Secondary | ICD-10-CM

## 2017-12-13 DIAGNOSIS — K449 Diaphragmatic hernia without obstruction or gangrene: Secondary | ICD-10-CM

## 2017-12-13 DIAGNOSIS — R0789 Other chest pain: Secondary | ICD-10-CM | POA: Diagnosis not present

## 2017-12-13 DIAGNOSIS — Z95828 Presence of other vascular implants and grafts: Secondary | ICD-10-CM

## 2017-12-13 DIAGNOSIS — D649 Anemia, unspecified: Secondary | ICD-10-CM

## 2017-12-13 DIAGNOSIS — N19 Unspecified kidney failure: Secondary | ICD-10-CM | POA: Insufficient documentation

## 2017-12-13 DIAGNOSIS — R202 Paresthesia of skin: Secondary | ICD-10-CM | POA: Diagnosis not present

## 2017-12-13 DIAGNOSIS — I38 Endocarditis, valve unspecified: Secondary | ICD-10-CM

## 2017-12-13 DIAGNOSIS — Z9981 Dependence on supplemental oxygen: Secondary | ICD-10-CM | POA: Diagnosis not present

## 2017-12-13 HISTORY — DX: Esophageal obstruction: K22.2

## 2017-12-13 HISTORY — DX: Anemia, unspecified: D64.9

## 2017-12-13 HISTORY — DX: Diaphragmatic hernia without obstruction or gangrene: K44.9

## 2017-12-13 HISTORY — DX: Personal history of transient ischemic attack (TIA), and cerebral infarction without residual deficits: Z86.73

## 2017-12-13 HISTORY — DX: Anxiety disorder, unspecified: F41.9

## 2017-12-13 HISTORY — DX: Unspecified kidney failure: N19

## 2017-12-13 HISTORY — DX: Presence of other vascular implants and grafts: Z95.828

## 2017-12-13 HISTORY — DX: Endocarditis, valve unspecified: I38

## 2017-12-13 HISTORY — DX: Gout, unspecified: M10.9

## 2017-12-13 HISTORY — DX: Chronic kidney disease, stage 3 unspecified: N18.30

## 2017-12-18 DIAGNOSIS — D6851 Activated protein C resistance: Secondary | ICD-10-CM

## 2017-12-18 DIAGNOSIS — I700 Atherosclerosis of aorta: Secondary | ICD-10-CM | POA: Insufficient documentation

## 2017-12-18 DIAGNOSIS — R079 Chest pain, unspecified: Secondary | ICD-10-CM | POA: Insufficient documentation

## 2017-12-18 HISTORY — DX: Atherosclerosis of aorta: I70.0

## 2017-12-18 HISTORY — DX: Activated protein C resistance: D68.51

## 2017-12-18 NOTE — Progress Notes (Signed)
Cardiology Office Note:    Date:  12/19/2017   ID:  Tina Dominguez, DOB Jul 15, 1941, MRN [id number]  PCP:  Raelyn Number, MD  Cardiologist:  Shirlee More, MD   Referring MD: Garnetta Buddy, NP  ASSESSMENT:    1. Essential hypertension   2. Chest pain in adult   3. Hyperlipidemia, unspecified hyperlipidemia type   4. Factor V Leiden mutation ([place])   5. Occlusion of right carotid artery   6. Cerebrovascular accident (CVA), unspecified mechanism ([place])    PLAN:    In order of problems listed above:  1. Poorly controlled symptomatic she will resume spironolactone 12 and half milligrams amlodipine 5 mg daily check home blood pressures and follow-up BMP in 1 week. 2. After discussion with the patient she really did not have a presentation of chest pain at this time I would not pursue his CAD ischemic evaluation  3. stable continue statin with stroke 4. Continue combined anticoagulant antiplatelet 5. Carotid duplex ordered with previous endarterectomy and stroke 6. Continue treatment antiplatelet anticoagulant antihypertensives and check carotid duplex   Next appointment 4 weeks   Medication Adjustments/Labs and Tests Ordered: Current medicines are reviewed at length with the patient today.  Concerns regarding medicines are outlined above.  No orders of the defined types were placed in this encounter.  Meds ordered this encounter  Medications  . amLODipine (NORVASC) 5 MG tablet    Sig: Take 1 tablet (5 mg total) by mouth daily.    Dispense:  90 tablet    Refill:  3  . spironolactone (ALDACTONE) 25 MG tablet    Sig: Take 0.5 tablets (12.5 mg total) by mouth daily.    Dispense:  45 tablet    Refill:  3     Chief Complaint  Patient presents with  . New Patient (Initial Visit)    to evaluate chest tightness.  Pt states she saw Dr Bettina Gavia several years ago     History of Present Illness:    Tina Dominguez is a 77 y.o. female with hypertension, hyperlipidemia and  strokewho is being seen today for the evaluation of chest pain at the request of Cox, Fredric Dine, NP.  [facility] admission Admit date: 10/04/2017  Discharge date and time: 10/07/17  Discharge to: Home  Discharge Diagnoses:  Problem List Items Addressed This Visit  History of DVT (deep vein thrombosis)  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CMS-HCC) (Chronic)  * (Principal)GI bleeding - Primary (Chronic)  Relevant Orders  Discharge instructions to patient: Call your primary care doctor and make an appointment to see them:   Other Visit Diagnoses  Vomiting, intractability of vomiting not specified, presence of nausea not specified, unspecified vomiting type   Procedures:   EGD (10/05/17): Findings: One benign-appearing, intrinsic mild stenosis was found at the  gastroesophageal junction. This stenosis measured less than one cm (in  length). The stenosis was traversed. No indication for dilation today. One small piece of clotted blood was found in the gastric fundus. Six non-bleeding, small, cratered and superficial duodenal ulcers with  no stigmata of bleeding were found in the duodenal bulb. The largest  lesion was 4 mm in largest dimension. An acquired benign-appearing, intrinsic severe stenosis was found in the  first portion of the duodenum and was non-traversed with the adult upper  scope.  Impression: - Benign-appearing esophageal stenosis. - One small piece of clotted blood in the gastric fundus. - Multiple non-bleeding duodenal ulcers with no stigmata  of bleeding. These are  the likely bleeding source. - Acquired duodenal stenosis. - No specimens collected.  Pertinent Test Results: endoscopy: gastroscopy: multiple gastric ulcers, acquired duodenal stricture  Hospital Course: [name] is a 77 yr old female with PMH of  hypercoagulopathy and multiple CVA managed with coumadin anticoagulation, esophageal stricture s/p EGD and dilation who presented to an outside hospital after three episodes of coffee ground emesis. Labs in the ED showed an INR of 5.6 and Hgb of 11.4. Her brief hospital course by problem is described below.  Upper Gastroesophageal Bleed: Mrs. Tina Dominguez was evaluated with upper endoscopy looking for a source of bleeding. The scope showed several small, non-bleeding cratered and duodenal ulcers with no stigmata. These were likely the source of her UGI bleed. It also showed acquired duodenal stenosis. GI recommendations include twice daily PPI indefinitely, sucralfate tablets 1g by mouth four times a day for one month, consider discontinuation of aspirin therapy, repeat upper EGD in one month for dilation of duodenal structure under fluoroscopy, and quad therapy for emperic h. Pylori treatment. These recommendations and therapies were started in the hospital  Coagulopathy: Mrs. Catha Gosselin INR was supertherapeutic upon arrival at the hospital. She was given a total of 7.5mg  Vitamin K to reverse the coumadin. Her repeat INR was 1.22. She was given heparin before her EGD to ensure she did not clot. After her procedure and low blood pressure measurements that followed, her anticoagulation was held for concern for a rebleeding ulcer as the source of her hypotension. When her hemodynamic status was normalized, she was started on Lovenox twice daily. She was given a one week bridge prescription of Lovenox to bridge her to warfarin at home. She expressed interest in a DOAC therapy but cost was prohibitive at this time. We advised her that there were drug assistance options for several of these medications that she could pursue in the outpatient setting.    He also had a recent [facility] visit [facility] after she had episodes of high blood pressure and high heart rate.  I had seen her perhaps 5 years ago and placed on  combination of spironolactone and amlodipine and her blood pressures been well controlled.  These were discontinued when she had hypotension at [facility]  The emergency room talks about chest pain she tells me she felt anxious all over 20 in her chest funny in her head but did not have pain or palpitation read the emergency room records and she had no loss of consciousness.  The EKG from that time showed sinus rhythm with minor nonspecific changes.  Past Medical History:  Diagnosis Date  . Aftercare following surgery of the circulatory system, [place] 10/14/2013  . Anemia 12/13/2017  . Anxiety 12/13/2017  . Arthritis   . Carotid artery occlusion   . Clotting disorder ([place])   . Depression   . Dysphagia 04/17/2017  . GERD (gastroesophageal reflux disease) 05/26/2011  . GI bleeding 10/04/2017  . Gout 12/13/2017  . Hiatal hernia 12/13/2017  . 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   Marland Kitchen History of recurrent TIAs 12/13/2017  . History of right-sided carotid endarterectomy 10/10/2011  . Hyperlipidemia   . Hypertension   . Leg pain    with walking  . Occlusion and stenosis of carotid artery without mention of cerebral infarction 10/08/2012  . Osteoarthritis 05/26/2011  . Osteoporosis   . PE (pulmonary embolism) 05/26/2011  .  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[place]) 04/17/2017  . Reflux   . Renal failure 12/13/2017  . S/P IVC filter 12/13/2017  . Schatzki's ring 12/13/2017  . Stroke [facility])    X's 4  . Valvular heart disease 12/13/2017    Past Surgical History:  Procedure Laterality Date  . ABDOMINAL HYSTERECTOMY  1980   TAH   . APPENDECTOMY    . BACK SURGERY  06/2011  . BREAST SURGERY     x2  . CAROTID ENDARTERECTOMY  11/11/2010   Right CEA  . IVC FILTER INSERTION    . JOINT REPLACEMENT Right 2005   Right Total Knee  . JOINT REPLACEMENT Left 2005   Left Total Knee  . SHOULDER SURGERY     right  .  SPINE SURGERY  07-11-11   Decomp. Laminectomy, fusion of L4-5,L5-S1 by Dr. Saintclair Halsted  . TOTAL KNEE ARTHROPLASTY     right  and left    Current Medications: Current Meds  Medication Sig  . citalopram (CELEXA) 10 MG tablet TK 1 T PO QD  . citalopram (CELEXA) 40 MG tablet Take 40 mg by mouth daily.    Marland Kitchen levothyroxine (SYNTHROID, LEVOTHROID) 125 MCG tablet Take 125 mcg by mouth daily.  Marland Kitchen lisinopril (PRINIVIL,ZESTRIL) 30 MG tablet Take 30 mg by mouth daily.  . mirtazapine (REMERON) 30 MG tablet Take 30 mg by mouth daily.  . Multiple Vitamin (MULTIVITAMIN) tablet Take 1 tablet by mouth daily.  Marland Kitchen oxyCODONE-acetaminophen (PERCOCET) 10-325 MG tablet Take 1 tablet by mouth every 6 (six) hours as needed for moderate pain. 10-325 One Tab every 12 hours  . OXYGEN Inhale into the lungs. 2 liters all the time  . pantoprazole (PROTONIX) 40 MG tablet Take 40 mg by mouth daily.  . pravastatin (PRAVACHOL) 40 MG tablet Take 40 mg by mouth daily.   Alveda Reasons 20 MG TABS tablet Take 20 mg by mouth daily.     Allergies:   Patient has no known allergies.   Social History   Socioeconomic History  . Marital status: Widowed    Spouse name: Not on file  . Number of children: Not on file  . Years of education: Not on file  . Highest education level: Not on file  Occupational History  . Not on file  Social Needs  . Financial resource strain: Not on file  . Food insecurity:    Worry: Not on file    Inability: Not on file  . Transportation needs:    Medical: Not on file    Non-medical: Not on file  Tobacco Use  . Smoking status: Never Smoker  . Smokeless tobacco: Never Used  Substance and Sexual Activity  . Alcohol use: No  . Drug use: No  . Sexual activity: Not on file  Lifestyle  . Physical activity:    Days per week: Not on file    Minutes per session: Not on file  . Stress: Not on file  Relationships  . Social connections:    Talks on phone: Not on file    Gets together: Not on file     Attends religious service: Not on file    Active member of club or organization: Not on file    Attends meetings of clubs or organizations: Not on file    Relationship status: Not on file  Other Topics Concern  . Not on file  Social History Narrative  . Not on file     Family History: The patient's family history includes Clotting  disorder in her mother; Deep vein thrombosis in her mother and sister; Diabetes in her mother and sister; Heart disease in her brother and mother; Heart disease (age of onset: 54) in her father; Hyperlipidemia in her mother; Hypertension in her mother; Stroke in her maternal grandmother; Varicose Veins in her father.  ROS:   Review of Systems  Constitution: Positive for decreased appetite.  HENT: Negative.   Eyes: Negative.   Cardiovascular: Positive for palpitations.  Respiratory: Positive for shortness of breath (on continuous oxygen).   Endocrine: Negative.   Hematologic/Lymphatic: Bruises/bleeds easily.  Skin: Positive for rash.  Musculoskeletal: Positive for back pain and muscle weakness.  Gastrointestinal: Positive for bloating and hematochezia.  Genitourinary: Positive for hematuria.  Neurological: Negative.   Psychiatric/Behavioral: Negative.   Allergic/Immunologic: Negative.    Please see the history of present illness.     All other systems reviewed and are negative.  EKGs/Labs/Other Studies Reviewed:    The following studies were reviewed today: Multiple records are reviewed including [facility] ED visits and [facility] admission.    Recent Labs: 12/13/2017 CBC normal creatinine 0.9 potassium 5.0 BNP level 1600 without clinical heart failure to troponins were normal in the emergency room EKG independently reviewed shows sinus rhythm possible left atrial enlargement left ventricular hypertrophy No results found for requested labs within last 8760 hours.  Recent Lipid Panel No results found for: CHOL, TRIG, HDL, CHOLHDL, VLDL,  LDLCALC, LDLDIRECT  Physical Exam:    VS:  BP (!) 148/86 (BP Location: Left Arm, Patient Position: Sitting, Cuff Size: Normal)   Ht 5\' 3"  (1.6 m)   Wt 155 lb 12.8 oz (70.7 kg)   SpO2 99%   BMI 27.60 kg/m     Wt Readings from Last 3 Encounters:  12/19/17 155 lb 12.8 oz (70.7 kg)  10/14/13 173 lb (78.5 kg)  10/08/12 169 lb 14.4 oz (77.1 kg)     GEN: Looks frail Well nourished, well developed in no acute distress HEENT: Normal NECK: No JVD; No carotid bruits LYMPHATICS: No lymphadenopathy CARDIAC: RRR, no murmurs, rubs, gallops RESPIRATORY:  Clear to auscultation without rales, wheezing or rhonchi  ABDOMEN: Soft, non-tender, non-distended MUSCULOSKELETAL:  No edema; No deformity  SKIN: Warm and dry NEUROLOGIC:  Alert and oriented x 3 PSYCHIATRIC:  Normal affect     Signed, Shirlee More, MD  12/19/2017 2:57 PM    [facility]

## 2017-12-19 ENCOUNTER — Ambulatory Visit (INDEPENDENT_AMBULATORY_CARE_PROVIDER_SITE_OTHER): Payer: PPO | Admitting: Cardiology

## 2017-12-19 ENCOUNTER — Encounter: Payer: Self-pay | Admitting: Cardiology

## 2017-12-19 VITALS — BP 148/86 | Ht 63.0 in | Wt 155.8 lb

## 2017-12-19 DIAGNOSIS — I6521 Occlusion and stenosis of right carotid artery: Secondary | ICD-10-CM

## 2017-12-19 DIAGNOSIS — E785 Hyperlipidemia, unspecified: Secondary | ICD-10-CM | POA: Diagnosis not present

## 2017-12-19 DIAGNOSIS — D6851 Activated protein C resistance: Secondary | ICD-10-CM

## 2017-12-19 DIAGNOSIS — R079 Chest pain, unspecified: Secondary | ICD-10-CM

## 2017-12-19 DIAGNOSIS — I10 Essential (primary) hypertension: Secondary | ICD-10-CM

## 2017-12-19 DIAGNOSIS — I639 Cerebral infarction, unspecified: Secondary | ICD-10-CM

## 2017-12-19 MED ORDER — SPIRONOLACTONE 25 MG PO TABS: 12.5000 mg | ORAL_TABLET | Freq: Every day | ORAL | 3 refills | Status: DC

## 2017-12-19 MED ORDER — AMLODIPINE BESYLATE 5 MG PO TABS: 5.0000 mg | ORAL_TABLET | Freq: Every day | ORAL | 3 refills | Status: DC

## 2017-12-19 NOTE — Patient Instructions (Addendum)
Medication Instructions:  Your physician has recommended you make the following change in your medication:  START amlodipine 5 mg daily START spironolactone 12.5 mg (0.5 tablet) daily  Check home blood pressure daily and record.  Labwork: Your physician recommends that you return for lab work in: 1 week. BMP  Testing/Procedures: Your physician has requested that you have a carotid duplex. This test is an ultrasound of the carotid arteries in your neck. It looks at blood flow through these arteries that supply the brain with blood. Allow one hour for this exam. There are no restrictions or special instructions.  Follow-Up: Your physician recommends that you schedule a follow-up appointment in: 4 weeks.  Any Other Special Instructions Will Be Listed Below (If Applicable).     If you need a refill on your cardiac medications before your next appointment, please call your pharmacy.

## 2018-01-01 DIAGNOSIS — D6851 Activated protein C resistance: Secondary | ICD-10-CM | POA: Diagnosis not present

## 2018-01-01 DIAGNOSIS — I10 Essential (primary) hypertension: Secondary | ICD-10-CM | POA: Diagnosis not present

## 2018-01-01 DIAGNOSIS — E782 Mixed hyperlipidemia: Secondary | ICD-10-CM | POA: Diagnosis not present

## 2018-01-01 DIAGNOSIS — Z7901 Long term (current) use of anticoagulants: Secondary | ICD-10-CM | POA: Diagnosis not present

## 2018-01-03 DIAGNOSIS — Z5181 Encounter for therapeutic drug level monitoring: Secondary | ICD-10-CM | POA: Diagnosis not present

## 2018-01-03 DIAGNOSIS — Z7901 Long term (current) use of anticoagulants: Secondary | ICD-10-CM | POA: Diagnosis not present

## 2018-01-03 DIAGNOSIS — E782 Mixed hyperlipidemia: Secondary | ICD-10-CM | POA: Diagnosis not present

## 2018-01-03 DIAGNOSIS — I10 Essential (primary) hypertension: Secondary | ICD-10-CM | POA: Diagnosis not present

## 2018-01-03 DIAGNOSIS — D6851 Activated protein C resistance: Secondary | ICD-10-CM | POA: Diagnosis not present

## 2018-01-03 DIAGNOSIS — Z79899 Other long term (current) drug therapy: Secondary | ICD-10-CM | POA: Diagnosis not present

## 2018-01-07 DIAGNOSIS — R0602 Shortness of breath: Secondary | ICD-10-CM | POA: Diagnosis not present

## 2018-01-07 DIAGNOSIS — J449 Chronic obstructive pulmonary disease, unspecified: Secondary | ICD-10-CM | POA: Diagnosis not present

## 2018-01-10 DIAGNOSIS — I10 Essential (primary) hypertension: Secondary | ICD-10-CM | POA: Diagnosis not present

## 2018-01-10 DIAGNOSIS — E875 Hyperkalemia: Secondary | ICD-10-CM | POA: Diagnosis not present

## 2018-01-10 DIAGNOSIS — N390 Urinary tract infection, site not specified: Secondary | ICD-10-CM | POA: Diagnosis not present

## 2018-01-10 DIAGNOSIS — N179 Acute kidney failure, unspecified: Secondary | ICD-10-CM | POA: Diagnosis not present

## 2018-01-16 ENCOUNTER — Ambulatory Visit (HOSPITAL_BASED_OUTPATIENT_CLINIC_OR_DEPARTMENT_OTHER): Payer: PPO

## 2018-01-16 ENCOUNTER — Ambulatory Visit (HOSPITAL_BASED_OUTPATIENT_CLINIC_OR_DEPARTMENT_OTHER)
Admission: RE | Admit: 2018-01-16 | Discharge: 2018-01-16 | Disposition: A | Discharge status: Home / Self Care | Payer: PPO | Source: Ambulatory Visit | Attending: Cardiology | Admitting: Cardiology

## 2018-01-16 DIAGNOSIS — Z87891 Personal history of nicotine dependence: Secondary | ICD-10-CM | POA: Diagnosis not present

## 2018-01-16 DIAGNOSIS — R609 Edema, unspecified: Secondary | ICD-10-CM | POA: Insufficient documentation

## 2018-01-16 DIAGNOSIS — I4891 Unspecified atrial fibrillation: Secondary | ICD-10-CM | POA: Insufficient documentation

## 2018-01-16 DIAGNOSIS — I6521 Occlusion and stenosis of right carotid artery: Secondary | ICD-10-CM | POA: Diagnosis not present

## 2018-01-16 DIAGNOSIS — R0602 Shortness of breath: Secondary | ICD-10-CM | POA: Diagnosis not present

## 2018-01-16 DIAGNOSIS — N183 Chronic kidney disease, stage 3 (moderate): Secondary | ICD-10-CM | POA: Diagnosis not present

## 2018-01-16 DIAGNOSIS — Z9889 Other specified postprocedural states: Secondary | ICD-10-CM | POA: Insufficient documentation

## 2018-01-16 DIAGNOSIS — I10 Essential (primary) hypertension: Secondary | ICD-10-CM | POA: Diagnosis not present

## 2018-01-16 DIAGNOSIS — N281 Cyst of kidney, acquired: Secondary | ICD-10-CM | POA: Diagnosis not present

## 2018-01-16 NOTE — Progress Notes (Signed)
*  PRELIMINARY RESULTS*  Carotid artery duplex performed, S/P right endarterectomy, moderate ICA re-stenosis, Mild left ICA stenosis. Joelene Millin 01/16/2018, 11:03 AM

## 2018-01-17 ENCOUNTER — Telehealth: Payer: Self-pay

## 2018-01-17 DIAGNOSIS — I10 Essential (primary) hypertension: Secondary | ICD-10-CM

## 2018-01-17 DIAGNOSIS — I6523 Occlusion and stenosis of bilateral carotid arteries: Secondary | ICD-10-CM

## 2018-01-17 NOTE — Telephone Encounter (Signed)
Patient informed of results. Patient advised of need for further testing with CTA of neck. Patient agrees to further testing. Advised patient of need to get insurance approval first, then will contact her to schedule. Patient verbalized understanding. No further questions.

## 2018-01-17 NOTE — Addendum Note (Signed)
Addended by: Warner Mccreedy E on: 01/17/2018 12:13 PM   Modules accepted: Orders

## 2018-01-17 NOTE — Telephone Encounter (Signed)
Attempted to contact patient. Patient needs BMP before CTA of neck can be scheduled. Advised patient to go to the [facility] in [place] this afternoon or tomorrow. She does not need to be fasting.

## 2018-01-17 NOTE — Telephone Encounter (Signed)
-----   Message from Richardo Priest, MD sent at 01/16/2018  4:44 PM EDT ----- I would like her to have a CTA of neck re carotid stenosis with abnormal duplex

## 2018-01-17 NOTE — Telephone Encounter (Signed)
Patient advised that she will require BMP prior to having CTA of neck.  Patient advised to go to [facility] in [place].  Patient verbalized understanding, and will have lab work tomorrow.

## 2018-01-18 ENCOUNTER — Telehealth: Payer: Self-pay

## 2018-01-18 DIAGNOSIS — I6523 Occlusion and stenosis of bilateral carotid arteries: Secondary | ICD-10-CM | POA: Diagnosis not present

## 2018-01-18 DIAGNOSIS — I10 Essential (primary) hypertension: Secondary | ICD-10-CM | POA: Diagnosis not present

## 2018-01-18 NOTE — Progress Notes (Signed)
Cardiology Office Note:    Date:  01/19/2018   ID:  Tina Dominguez, DOB September 01, 1941, MRN [id number]  PCP:  Raelyn Number, MD  Cardiologist:  Shirlee More, MD    Referring MD: Raelyn Number, MD    ASSESSMENT:    1. Carotid stenosis, bilateral   2. Hyperkalemia   3. Essential hypertension   4. Chronic anticoagulation    PLAN:    In order of problems listed above:  1. Recent cerebrovascular duplex was abnormal 50 to 69% stenosis the right ICA after CEA.  Initially I plan to send her for CT angiography but she has an element of acute renal failure and drug-induced hyperkalemia I will refer directly back to vascular surgery for an opinion before any further diagnostic testing. 2. No related to ACE inhibitor plus Spironolactone managed by nephrology both are discontinued. 3. Poorly controlled diastolic greater than 90 I placed her on bedtime Catapres.  She will continue her calcium channel blocker 4. Stable continue her anticoagulant   Next appointment: I will see back in the office as needed as she is seeing nephrology hypertension renal failure hyperkalemia and referred to vascular surgery   Medication Adjustments/Labs and Tests Ordered: Current medicines are reviewed at length with the patient today.  Concerns regarding medicines are outlined above.  No orders of the defined types were placed in this encounter.  No orders of the defined types were placed in this encounter.   Chief Complaint  Patient presents with  . Follow-up    after carotid duplex: Right Carotid: Velocities in the right ICA are consistent with a 60-79%  . Hypertension  . Hyperlipidemia    History of Present Illness:    Tina Dominguez is a 77 y.o. female with a hx of hypertension, hyperlipidemia and stroke last seen 12/19/17 to evaluate chest pain.  ASSESSMENT:    12/19/17   1. Essential hypertension   2. Chest pain in adult   3. Hyperlipidemia, unspecified hyperlipidemia type   4. Factor V  Leiden mutation ([place])   5. Occlusion of right carotid artery   6. Cerebrovascular accident (CVA), unspecified mechanism ([place])    PLAN:    In order of problems listed above:  1.   Poorly controlled symptomatic she will resume spironolactone 12 and half milligrams amlodipine 5 mg daily check home blood pressures and follow-up BMP in 1 week. 5. After discussion with the patient she really did not have a presentation of chest pain at this time I would not pursue a CAD ischemic evaluation  6. stable continue statin with stroke 7. Continue combined anticoagulant antiplatelet 8. Carotid duplex ordered with previous endarterectomy and stroke 9. Continue treatment antiplatelet anticoagulant antihypertensives and check carotid duplex  Compliance with diet, lifestyle and medications: Yes  She relates home blood pressures are greater than 90 diastolic since Spironolactone lisinopril were discontinued  She developed hyperkalemia with spironolactone and is seeing nephrology. Both spironolactone and lisinopril were discontinued. Past Medical History:  Diagnosis Date  . Aftercare following surgery of the circulatory system, [place] 10/14/2013  . Anemia 12/13/2017  . Anxiety 12/13/2017  . Arthritis   . Carotid artery occlusion   . Clotting disorder ([place])   . Depression   . Dysphagia 04/17/2017  . GERD (gastroesophageal reflux disease) 05/26/2011  . GI bleeding 10/04/2017  . Gout 12/13/2017  . Hiatal hernia 12/13/2017  . History of DVT (deep vein thrombosis) 04/17/2017   Overview:  Multiple dvts.  Thought to be due to Factor  V, but genetic testing negative.   Marland Kitchen History of recurrent TIAs 12/13/2017  . History of right-sided carotid endarterectomy 10/10/2011  . Hyperlipidemia   . Hypertension   . Leg pain    with walking  . Occlusion and stenosis of carotid artery without mention of cerebral infarction 10/08/2012  . Osteoarthritis 05/26/2011  . Osteoporosis   . PE (pulmonary embolism) 05/26/2011  .  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[place]) 04/17/2017  . Reflux   . Renal failure 12/13/2017  . S/P IVC filter 12/13/2017  . Schatzki's ring 12/13/2017  . Stroke [facility])    X's 4  . Valvular heart disease 12/13/2017    Past Surgical History:  Procedure Laterality Date  . ABDOMINAL HYSTERECTOMY  1980   TAH   . APPENDECTOMY    . BACK SURGERY  06/2011  . BREAST SURGERY     x2  . CAROTID ENDARTERECTOMY  11/11/2010   Right CEA  . IVC FILTER INSERTION    . JOINT REPLACEMENT Right 2005   Right Total Knee  . JOINT REPLACEMENT Left 2005   Left Total Knee  . SHOULDER SURGERY     right  . SPINE SURGERY  07-11-11   Decomp. Laminectomy, fusion of L4-5,L5-S1 by Dr. Saintclair Halsted  . TOTAL KNEE ARTHROPLASTY     right  and left    Current Medications: Current Meds  Medication Sig  . amLODipine (NORVASC) 5 MG tablet Take 1 tablet (5 mg total) by mouth daily.  . citalopram (CELEXA) 10 MG tablet TK 1 T PO QD  . citalopram (CELEXA) 40 MG tablet Take 40 mg by mouth daily.    Marland Kitchen levothyroxine (SYNTHROID, LEVOTHROID) 125 MCG tablet Take 125 mcg by mouth daily.  . mirtazapine (REMERON) 30 MG tablet Take 30 mg by mouth daily.  . Multiple Vitamin (MULTIVITAMIN) tablet Take 1 tablet by mouth daily.  Marland Kitchen oxyCODONE-acetaminophen (PERCOCET) 10-325 MG tablet Take 1 tablet by mouth every 6 (six) hours as needed for moderate pain. 10-325 One Tab every 12 hours  . OXYGEN Inhale into the lungs. 2 liters all the time  . pantoprazole (PROTONIX) 40 MG tablet Take 40 mg by mouth daily.  . pravastatin (PRAVACHOL) 40 MG tablet Take 40 mg by mouth daily.   . sodium polystyrene (KAYEXALATE) 15 GM/60ML suspension Take 30 g by mouth.  Alveda Reasons 20 MG TABS tablet Take 20 mg by mouth daily.     Allergies:   Patient has no known allergies.   Social History   Socioeconomic History  . Marital status: Widowed    Spouse name: Not on file  . Number of children: Not on file  . Years of education: Not on file  . Highest education level: Not  on file  Occupational History  . Not on file  Social Needs  . Financial resource strain: Not on file  . Food insecurity:    Worry: Not on file    Inability: Not on file  . Transportation needs:    Medical: Not on file    Non-medical: Not on file  Tobacco Use  . Smoking status: Never Smoker  . Smokeless tobacco: Never Used  Substance and Sexual Activity  . Alcohol use: No  . Drug use: No  . Sexual activity: Not on file  Lifestyle  . Physical activity:    Days per week: Not on file    Minutes per session: Not on file  . Stress: Not on file  Relationships  . Social connections:    Talks  on phone: Not on file    Gets together: Not on file    Attends religious service: Not on file    Active member of club or organization: Not on file    Attends meetings of clubs or organizations: Not on file    Relationship status: Not on file  Other Topics Concern  . Not on file  Social History Narrative  . Not on file     Family History: The patient's family history includes Clotting disorder in her mother; Deep vein thrombosis in her mother and sister; Diabetes in her mother and sister; Heart disease in her brother and mother; Heart disease (age of onset: 48) in her father; Hyperlipidemia in her mother; Hypertension in her mother; Stroke in her maternal grandmother; Varicose Veins in her father. ROS:   Please see the history of present illness.    All other systems reviewed and are negative.  EKGs/Labs/Other Studies Reviewed:    The following studies were reviewed today:  Carotid duplex: Final Interpretation: Right Carotid: Velocities in the right ICA are consistent with a 60-79%        stenosis. The ECA appears >50% stenosed. Left Carotid: Velocities in the left ICA are consistent with a 40-59% stenosis. Vertebrals: Bilateral vertebral arteries demonstrate antegrade flow.  Recent Labs: 01/18/2018: BUN 23; Creatinine, Ser 1.18; Potassium 6.7; Sodium 139  Recent Lipid  Panel No results found for: CHOL, TRIG, HDL, CHOLHDL, VLDL, LDLCALC, LDLDIRECT  Physical Exam:    VS:  Ht 5\' 3"  (1.6 m)   Wt 154 lb (69.9 kg)   BMI 27.28 kg/m     Wt Readings from Last 3 Encounters:  01/19/18 154 lb (69.9 kg)  12/19/17 155 lb 12.8 oz (70.7 kg)  10/14/13 173 lb (78.5 kg)     GEN:  Well nourished, well developed in no acute distress HEENT: Normal NECK: No JVD; No carotid bruits LYMPHATICS: No lymphadenopathy CARDIAC: RRR, no murmurs, rubs, gallops RESPIRATORY:  Clear to auscultation without rales, wheezing or rhonchi  ABDOMEN: Soft, non-tender, non-distended MUSCULOSKELETAL:  No edema; No deformity  SKIN: Warm and dry NEUROLOGIC:  Alert and oriented x 3 PSYCHIATRIC:  Normal affect    Signed, Shirlee More, MD  01/19/2018 11:00 AM    [place]

## 2018-01-18 NOTE — Telephone Encounter (Signed)
Laurine from [employer] called regarding panic lab result for patient. Potassium result 6.7.  Reviewed result with Dr. Bettina Gavia. Dr. Bettina Gavia advised to contact Dr. Dyann Kief office as patient was seen by him 01/10/18 for hyperkalemia and ask if he wanted to address lab or if he wanted [place] to send her to the emergency department.  Spoke with Dr. Neta Ehlers personally who advised for results to be faxed to his office and his office would contact the patient regarding treatment plan. Results faxed to Dr. Dyann Kief office.

## 2018-01-19 ENCOUNTER — Ambulatory Visit (INDEPENDENT_AMBULATORY_CARE_PROVIDER_SITE_OTHER): Payer: PPO | Admitting: Cardiology

## 2018-01-19 ENCOUNTER — Other Ambulatory Visit: Payer: Self-pay

## 2018-01-19 ENCOUNTER — Encounter: Payer: Self-pay | Admitting: Cardiology

## 2018-01-19 VITALS — BP 140/98 | HR 68 | Ht 63.0 in | Wt 154.0 lb

## 2018-01-19 DIAGNOSIS — Z7901 Long term (current) use of anticoagulants: Secondary | ICD-10-CM

## 2018-01-19 DIAGNOSIS — I10 Essential (primary) hypertension: Secondary | ICD-10-CM | POA: Diagnosis not present

## 2018-01-19 DIAGNOSIS — E875 Hyperkalemia: Secondary | ICD-10-CM

## 2018-01-19 DIAGNOSIS — I6523 Occlusion and stenosis of bilateral carotid arteries: Secondary | ICD-10-CM

## 2018-01-19 LAB — BASIC METABOLIC PANEL
BUN / CREAT RATIO: 19 (ref 12–28)
BUN: 23 mg/dL (ref 8–27)
CO2: 21 mmol/L (ref 20–29)
Calcium: 9.8 mg/dL (ref 8.7–10.3)
Chloride: 109 mmol/L — ABNORMAL HIGH (ref 96–106)
Creatinine, Ser: 1.18 mg/dL — ABNORMAL HIGH (ref 0.57–1.00)
GFR calc non Af Amer: 46 mL/min/{1.73_m2} — ABNORMAL LOW (ref >59)
GFR, EST AFRICAN AMERICAN: 53 mL/min/{1.73_m2} — AB (ref >59)
GLUCOSE: 95 mg/dL (ref 65–99)
Potassium: 6.7 mmol/L (ref 3.5–5.2)
Sodium: 139 mmol/L (ref 134–144)

## 2018-01-19 MED ORDER — CLONIDINE HCL 0.1 MG PO TABS: 0.1000 mg | ORAL_TABLET | Freq: Every day | ORAL | 1 refills | Status: DC

## 2018-01-19 MED ORDER — CLONIDINE HCL 0.1 MG PO TABS: 0.1000 mg | ORAL_TABLET | Freq: Every day | ORAL | 2 refills | Status: DC

## 2018-01-19 NOTE — Patient Instructions (Signed)
Medication Instructions:  Your physician has recommended you make the following change in your medication: Start taking CATAPRES .1 mg at bedtime   Labwork: None  Testing/Procedures: None        Follow-Up:Your physician recommends that you schedule a follow-up appointment as needed and if symptoms worsen or fail to  Improve.    Any Other Special Instructions Will Be Listed Below (If Applicable).     If you need a refill on your cardiac medications before your next appointment, please call your pharmacy. Marland Kitcheninst  Any Other Special Instructions Will Be Listed Below (If Applicable).     If you need a refill on your cardiac medications before your next appointment, please call your pharmacy.

## 2018-01-22 DIAGNOSIS — E875 Hyperkalemia: Secondary | ICD-10-CM | POA: Diagnosis not present

## 2018-01-30 DIAGNOSIS — R300 Dysuria: Secondary | ICD-10-CM | POA: Diagnosis not present

## 2018-01-30 DIAGNOSIS — D6851 Activated protein C resistance: Secondary | ICD-10-CM | POA: Diagnosis not present

## 2018-01-30 DIAGNOSIS — E782 Mixed hyperlipidemia: Secondary | ICD-10-CM | POA: Diagnosis not present

## 2018-01-30 DIAGNOSIS — I10 Essential (primary) hypertension: Secondary | ICD-10-CM | POA: Diagnosis not present

## 2018-01-30 DIAGNOSIS — Z5181 Encounter for therapeutic drug level monitoring: Secondary | ICD-10-CM | POA: Diagnosis not present

## 2018-01-30 DIAGNOSIS — Z7901 Long term (current) use of anticoagulants: Secondary | ICD-10-CM | POA: Diagnosis not present

## 2018-01-31 NOTE — Addendum Note (Signed)
Addended by: Warner Mccreedy E on: 01/31/2018 03:10 PM   Modules accepted: Orders

## 2018-02-02 ENCOUNTER — Other Ambulatory Visit: Payer: Self-pay

## 2018-02-02 DIAGNOSIS — I6523 Occlusion and stenosis of bilateral carotid arteries: Secondary | ICD-10-CM

## 2018-02-06 DIAGNOSIS — R0602 Shortness of breath: Secondary | ICD-10-CM | POA: Diagnosis not present

## 2018-02-06 DIAGNOSIS — J449 Chronic obstructive pulmonary disease, unspecified: Secondary | ICD-10-CM | POA: Diagnosis not present

## 2018-02-15 DIAGNOSIS — I10 Essential (primary) hypertension: Secondary | ICD-10-CM | POA: Diagnosis not present

## 2018-02-27 DIAGNOSIS — E782 Mixed hyperlipidemia: Secondary | ICD-10-CM | POA: Diagnosis not present

## 2018-02-27 DIAGNOSIS — I10 Essential (primary) hypertension: Secondary | ICD-10-CM | POA: Diagnosis not present

## 2018-02-27 DIAGNOSIS — Z7901 Long term (current) use of anticoagulants: Secondary | ICD-10-CM | POA: Diagnosis not present

## 2018-02-27 DIAGNOSIS — D6851 Activated protein C resistance: Secondary | ICD-10-CM | POA: Diagnosis not present

## 2018-02-27 DIAGNOSIS — Z5181 Encounter for therapeutic drug level monitoring: Secondary | ICD-10-CM | POA: Diagnosis not present

## 2018-03-09 DIAGNOSIS — R0602 Shortness of breath: Secondary | ICD-10-CM | POA: Diagnosis not present

## 2018-03-09 DIAGNOSIS — J449 Chronic obstructive pulmonary disease, unspecified: Secondary | ICD-10-CM | POA: Diagnosis not present

## 2018-03-26 ENCOUNTER — Ambulatory Visit (HOSPITAL_COMMUNITY)
Admission: RE | Admit: 2018-03-26 | Discharge: 2018-03-26 | Disposition: A | Discharge status: Home / Self Care | Payer: PPO | Source: Ambulatory Visit | Attending: Surgery | Admitting: Surgery

## 2018-03-26 ENCOUNTER — Ambulatory Visit (INDEPENDENT_AMBULATORY_CARE_PROVIDER_SITE_OTHER): Payer: PPO | Admitting: Surgery

## 2018-03-26 ENCOUNTER — Encounter: Payer: Self-pay | Admitting: Surgery

## 2018-03-26 ENCOUNTER — Other Ambulatory Visit: Payer: Self-pay

## 2018-03-26 VITALS — BP 183/82 | HR 61 | Temp 97.0°F | Resp 16 | Ht 63.0 in | Wt 159.0 lb

## 2018-03-26 DIAGNOSIS — I6523 Occlusion and stenosis of bilateral carotid arteries: Secondary | ICD-10-CM

## 2018-03-26 NOTE — Progress Notes (Signed)
[facility]  Patient name: Tina Dominguez MRN: [id number] DOB: [birth year] Sex: female   REQUESTING PROVIDER:    Dr. Bettina Gavia   REASON FOR CONSULT:    Carotid stenosis  HISTORY OF PRESENT ILLNESS:   Tina Dominguez is a 77 y.o. female, who is here today for evaluation of her carotid disease.  Patient is well-known to me having undergone right carotid endarterectomy on 11/12/2010.  This was done for symptomatic stenosis.  She was having left arm and left leg symptoms.  Intraoperative findings included a lesion 2 cm above the carotid bifurcation as well as an ulcer in the carotid bulb.  Unfortunately, she was lost to follow-up and has not been seen for at least 4-5 years.  She recently has been having some neurologic symptoms which led to a carotid duplex that showed 60 to 79% recurrent right-sided stenosis.  She describes her symptoms as numbness all over.  Her most recent symptom was several months ago.  She does not have any localizing symptoms.  She has limited recall of what she experiences but does say that she has tingling in both of her hands.  She denies any visual symptoms.  She does not have any motor function loss on either side.  She states that her episodes are associated with extremely elevated blood pressure.  Patient has a history of DVT for which she is on anticoagulation.  She also has an IVC filter and history of PE.  States staff hypercholesterolemia.  Her blood pressure is medically managed and being adjusted.  PAST MEDICAL HISTORY    Past Medical History:  Diagnosis Date  . Aftercare following surgery of the circulatory system, [place] 10/14/2013  . Anemia 12/13/2017  . Anxiety 12/13/2017  . Arthritis   . Carotid artery occlusion   . Clotting disorder ([place])   . Depression   . Dysphagia 04/17/2017  . GERD (gastroesophageal reflux disease) 05/26/2011  . GI bleeding 10/04/2017  . Gout 12/13/2017  . Hiatal  hernia 12/13/2017  . 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   Marland Kitchen History of recurrent TIAs 12/13/2017  . History of right-sided carotid endarterectomy 10/10/2011  . Hyperlipidemia   . Hypertension   . Leg pain    with walking  . Occlusion and stenosis of carotid artery without mention of cerebral infarction 10/08/2012  . Osteoarthritis 05/26/2011  . Osteoporosis   . PE (pulmonary embolism) 05/26/2011  . 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 Reflux   . Renal failure 12/13/2017  . S/P IVC filter 12/13/2017  . Schatzki's ring 12/13/2017  . Stroke [facility])    X's 4  . Valvular heart disease 12/13/2017     FAMILY HISTORY   Family History  Problem Relation Age of Onset  . Clotting disorder Mother   . Deep vein thrombosis Mother   . Diabetes Mother   . Heart disease Mother   . Hyperlipidemia Mother   . Hypertension Mother   . Heart disease Father 22  . Varicose Veins Father   . Stroke Maternal Grandmother   . Deep vein thrombosis Sister   . Diabetes Sister   . Heart disease Brother     SOCIAL HISTORY:   Social History   Socioeconomic History  . Marital status: Widowed    Spouse name: Not on file  . Number of children: Not on file  .  Years of education: Not on file  . Highest education level: Not on file  Occupational History  . Not on file  Social Needs  . Financial resource strain: Not on file  . Food insecurity:    Worry: Not on file    Inability: Not on file  . Transportation needs:    Medical: Not on file    Non-medical: Not on file  Tobacco Use  . Smoking status: Never Smoker  . Smokeless tobacco: Never Used  Substance and Sexual Activity  . Alcohol use: No  . Drug use: No  . Sexual activity: Not on file  Lifestyle  . Physical activity:    Days per week: Not on file    Minutes per session: Not on file  . Stress: Not on file  Relationships  . Social  connections:    Talks on phone: Not on file    Gets together: Not on file    Attends religious service: Not on file    Active member of club or organization: Not on file    Attends meetings of clubs or organizations: Not on file    Relationship status: Not on file  . Intimate partner violence:    Fear of current or ex partner: Not on file    Emotionally abused: Not on file    Physically abused: Not on file    Forced sexual activity: Not on file  Other Topics Concern  . Not on file  Social History Narrative  . Not on file    ALLERGIES:    No Known Allergies  CURRENT MEDICATIONS:    Current Outpatient Medications  Medication Sig Dispense Refill  . amLODipine (NORVASC) 5 MG tablet Take by mouth.    . citalopram (CELEXA) 10 MG tablet TK 1 T PO QD  3  . citalopram (CELEXA) 40 MG tablet Take 40 mg by mouth daily.      . cloNIDine (CATAPRES) 0.1 MG tablet Take 1 tablet (0.1 mg total) by mouth at bedtime. 90 tablet 2  . levothyroxine (SYNTHROID, LEVOTHROID) 125 MCG tablet Take 125 mcg by mouth daily.  0  . mirtazapine (REMERON) 30 MG tablet Take 30 mg by mouth daily.    . Multiple Vitamin (MULTIVITAMIN) tablet Take 1 tablet by mouth daily.    Marland Kitchen oxyCODONE-acetaminophen (PERCOCET) 10-325 MG tablet Take 1 tablet by mouth every 6 (six) hours as needed for moderate pain. 10-325 One Tab every 12 hours    . OXYGEN Inhale into the lungs. 2 liters all the time    . pantoprazole (PROTONIX) 40 MG tablet Take 40 mg by mouth daily.  1  . pravastatin (PRAVACHOL) 40 MG tablet Take 40 mg by mouth daily.     Alveda Reasons 20 MG TABS tablet Take 20 mg by mouth daily.  0  . amLODipine (NORVASC) 5 MG tablet Take 1 tablet (5 mg total) by mouth daily. 90 tablet 3  . sodium polystyrene (KAYEXALATE) powder   0   No current facility-administered medications for this visit.     REVIEW OF SYSTEMS:   [X]  denotes positive finding, [ ]  denotes negative finding Cardiac  Comments:  Chest pain or chest  pressure: x   Shortness of breath upon exertion: x   Short of breath when lying flat: x   Irregular heart rhythm:        Vascular    Pain in calf, thigh, or hip brought on by ambulation: x   Pain in feet at night that  wakes you up from your sleep:  x   Blood clot in your veins:    Leg swelling:         Pulmonary    Oxygen at home:    Productive cough:     Wheezing:         Neurologic    Sudden weakness in arms or legs:     Sudden numbness in arms or legs:     Sudden onset of difficulty speaking or slurred speech:    Temporary loss of vision in one eye:     Problems with dizziness:         Gastrointestinal    Blood in stool:      Vomited blood:         Genitourinary    Burning when urinating:     Blood in urine:        Psychiatric    Major depression:  x       Hematologic    Bleeding problems:    Problems with blood clotting too easily:        Skin    Rashes or ulcers:        Constitutional    Fever or chills:     PHYSICAL EXAM:   Vitals:   03/26/18 1200 03/26/18 1205 03/26/18 1208  BP: (!) 158/87 (!) 177/87 (!) 183/82  Pulse: 61 61 61  Resp: 16    Temp: (!) 97 F (36.1 C)    TempSrc: Oral    SpO2: 99%    Weight: 159 lb (72.1 kg)    Height: 5\' 3"  (1.6 m)      GENERAL: The patient is a well-nourished female, in no acute distress. The vital signs are documented above. CARDIAC: There is a regular rate and rhythm.  VASCULAR: No carotid bruits. PULMONARY: Nonlabored respirations MUSCULOSKELETAL: There are no major deformities or cyanosis. NEUROLOGIC: No focal weakness or paresthesias are detected. SKIN: There are no ulcers or rashes noted. PSYCHIATRIC: The patient has a normal affect.  STUDIES:   Ultrasound was ordered and reviewed today.  The right-sided stenosis is 60 to 79%.  The left is 40-59%.  Both values are at the low end of the range based on end-diastolic velocities.  ASSESSMENT and PLAN   Recurrent carotid stenosis: The patient has a  history of right carotid endarterectomy for symptomatic stenosis in 2012.  She is lost to follow-up.  She has had recurrent neurologic symptoms.  Based on her description of the symptoms, I do not feel that these are related to the recurrent stenosis in her right carotid artery.  I would not recommend intervention at this time.  I did discuss the importance of surveillance imaging.  I have scheduled her for return visit in 6 months with carotid duplex.  I suspect that her symptoms are related to hypertensive crisis as she does appear to have elevated blood pressure when her symptoms occur, and her symptoms appear to be bilateral.   Annamarie Major, MD [facility] [phone] Pager [phone]

## 2018-03-28 DIAGNOSIS — E782 Mixed hyperlipidemia: Secondary | ICD-10-CM | POA: Diagnosis not present

## 2018-03-28 DIAGNOSIS — Z5181 Encounter for therapeutic drug level monitoring: Secondary | ICD-10-CM | POA: Diagnosis not present

## 2018-03-28 DIAGNOSIS — I10 Essential (primary) hypertension: Secondary | ICD-10-CM | POA: Diagnosis not present

## 2018-03-28 DIAGNOSIS — D6851 Activated protein C resistance: Secondary | ICD-10-CM | POA: Diagnosis not present

## 2018-03-28 DIAGNOSIS — G894 Chronic pain syndrome: Secondary | ICD-10-CM | POA: Diagnosis not present

## 2018-04-26 DIAGNOSIS — Z7901 Long term (current) use of anticoagulants: Secondary | ICD-10-CM | POA: Diagnosis not present

## 2018-04-26 DIAGNOSIS — D6851 Activated protein C resistance: Secondary | ICD-10-CM | POA: Diagnosis not present

## 2018-04-26 DIAGNOSIS — I10 Essential (primary) hypertension: Secondary | ICD-10-CM | POA: Diagnosis not present

## 2018-04-26 DIAGNOSIS — Z86718 Personal history of other venous thrombosis and embolism: Secondary | ICD-10-CM | POA: Diagnosis not present

## 2018-04-26 DIAGNOSIS — E782 Mixed hyperlipidemia: Secondary | ICD-10-CM | POA: Diagnosis not present

## 2018-04-27 DIAGNOSIS — Z5181 Encounter for therapeutic drug level monitoring: Secondary | ICD-10-CM | POA: Diagnosis not present

## 2018-05-24 DIAGNOSIS — Z7901 Long term (current) use of anticoagulants: Secondary | ICD-10-CM | POA: Diagnosis not present

## 2018-05-24 DIAGNOSIS — D6851 Activated protein C resistance: Secondary | ICD-10-CM | POA: Diagnosis not present

## 2018-05-24 DIAGNOSIS — I10 Essential (primary) hypertension: Secondary | ICD-10-CM | POA: Diagnosis not present

## 2018-05-24 DIAGNOSIS — Z5181 Encounter for therapeutic drug level monitoring: Secondary | ICD-10-CM | POA: Diagnosis not present

## 2018-05-24 DIAGNOSIS — E782 Mixed hyperlipidemia: Secondary | ICD-10-CM | POA: Diagnosis not present

## 2018-06-13 DIAGNOSIS — M7062 Trochanteric bursitis, left hip: Secondary | ICD-10-CM | POA: Diagnosis not present

## 2018-06-13 DIAGNOSIS — M1612 Unilateral primary osteoarthritis, left hip: Secondary | ICD-10-CM | POA: Diagnosis not present

## 2018-06-13 DIAGNOSIS — M160 Bilateral primary osteoarthritis of hip: Secondary | ICD-10-CM | POA: Diagnosis not present

## 2018-06-25 DIAGNOSIS — E038 Other specified hypothyroidism: Secondary | ICD-10-CM | POA: Diagnosis not present

## 2018-06-25 DIAGNOSIS — Z5181 Encounter for therapeutic drug level monitoring: Secondary | ICD-10-CM | POA: Diagnosis not present

## 2018-06-25 DIAGNOSIS — E782 Mixed hyperlipidemia: Secondary | ICD-10-CM | POA: Diagnosis not present

## 2018-06-25 DIAGNOSIS — D6851 Activated protein C resistance: Secondary | ICD-10-CM | POA: Diagnosis not present

## 2018-06-25 DIAGNOSIS — I10 Essential (primary) hypertension: Secondary | ICD-10-CM | POA: Diagnosis not present

## 2018-07-26 DIAGNOSIS — I10 Essential (primary) hypertension: Secondary | ICD-10-CM | POA: Diagnosis not present

## 2018-07-26 DIAGNOSIS — D6851 Activated protein C resistance: Secondary | ICD-10-CM | POA: Diagnosis not present

## 2018-07-26 DIAGNOSIS — Z7901 Long term (current) use of anticoagulants: Secondary | ICD-10-CM | POA: Diagnosis not present

## 2018-07-26 DIAGNOSIS — Z5181 Encounter for therapeutic drug level monitoring: Secondary | ICD-10-CM | POA: Diagnosis not present

## 2018-07-26 DIAGNOSIS — Z23 Encounter for immunization: Secondary | ICD-10-CM | POA: Diagnosis not present

## 2018-07-26 DIAGNOSIS — E782 Mixed hyperlipidemia: Secondary | ICD-10-CM | POA: Diagnosis not present

## 2018-08-09 DIAGNOSIS — H5212 Myopia, left eye: Secondary | ICD-10-CM | POA: Diagnosis not present

## 2018-08-09 DIAGNOSIS — H2513 Age-related nuclear cataract, bilateral: Secondary | ICD-10-CM | POA: Diagnosis not present

## 2018-08-27 DIAGNOSIS — Z5181 Encounter for therapeutic drug level monitoring: Secondary | ICD-10-CM | POA: Diagnosis not present

## 2018-08-27 DIAGNOSIS — E782 Mixed hyperlipidemia: Secondary | ICD-10-CM | POA: Diagnosis not present

## 2018-08-27 DIAGNOSIS — D6851 Activated protein C resistance: Secondary | ICD-10-CM | POA: Diagnosis not present

## 2018-08-27 DIAGNOSIS — M064 Inflammatory polyarthropathy: Secondary | ICD-10-CM | POA: Diagnosis not present

## 2018-08-27 DIAGNOSIS — I10 Essential (primary) hypertension: Secondary | ICD-10-CM | POA: Diagnosis not present

## 2018-09-21 DIAGNOSIS — M064 Inflammatory polyarthropathy: Secondary | ICD-10-CM | POA: Diagnosis not present

## 2018-09-21 DIAGNOSIS — Z5181 Encounter for therapeutic drug level monitoring: Secondary | ICD-10-CM | POA: Diagnosis not present

## 2018-09-21 DIAGNOSIS — D6851 Activated protein C resistance: Secondary | ICD-10-CM | POA: Diagnosis not present

## 2018-09-21 DIAGNOSIS — E782 Mixed hyperlipidemia: Secondary | ICD-10-CM | POA: Diagnosis not present

## 2018-09-21 DIAGNOSIS — I10 Essential (primary) hypertension: Secondary | ICD-10-CM | POA: Diagnosis not present

## 2018-10-04 DIAGNOSIS — R319 Hematuria, unspecified: Secondary | ICD-10-CM | POA: Diagnosis not present

## 2018-10-12 DIAGNOSIS — N179 Acute kidney failure, unspecified: Secondary | ICD-10-CM | POA: Diagnosis not present

## 2018-10-12 DIAGNOSIS — R809 Proteinuria, unspecified: Secondary | ICD-10-CM | POA: Diagnosis not present

## 2018-10-12 DIAGNOSIS — R3121 Asymptomatic microscopic hematuria: Secondary | ICD-10-CM | POA: Diagnosis not present

## 2018-10-22 DIAGNOSIS — M064 Inflammatory polyarthropathy: Secondary | ICD-10-CM | POA: Diagnosis not present

## 2018-10-22 DIAGNOSIS — R079 Chest pain, unspecified: Secondary | ICD-10-CM | POA: Diagnosis not present

## 2018-10-22 DIAGNOSIS — I10 Essential (primary) hypertension: Secondary | ICD-10-CM | POA: Diagnosis not present

## 2018-10-22 DIAGNOSIS — R011 Cardiac murmur, unspecified: Secondary | ICD-10-CM | POA: Diagnosis not present

## 2018-10-22 DIAGNOSIS — E782 Mixed hyperlipidemia: Secondary | ICD-10-CM | POA: Diagnosis not present

## 2018-10-22 DIAGNOSIS — R5383 Other fatigue: Secondary | ICD-10-CM | POA: Diagnosis not present

## 2018-10-22 DIAGNOSIS — D6851 Activated protein C resistance: Secondary | ICD-10-CM | POA: Diagnosis not present

## 2018-10-22 DIAGNOSIS — Z5181 Encounter for therapeutic drug level monitoring: Secondary | ICD-10-CM | POA: Diagnosis not present

## 2018-11-07 ENCOUNTER — Encounter: Payer: Self-pay | Admitting: Cardiology

## 2018-11-07 ENCOUNTER — Ambulatory Visit (INDEPENDENT_AMBULATORY_CARE_PROVIDER_SITE_OTHER): Payer: PPO | Admitting: Cardiology

## 2018-11-07 VITALS — BP 162/82 | HR 64 | Ht 63.0 in | Wt 158.5 lb

## 2018-11-07 DIAGNOSIS — I10 Essential (primary) hypertension: Secondary | ICD-10-CM

## 2018-11-07 DIAGNOSIS — R079 Chest pain, unspecified: Secondary | ICD-10-CM

## 2018-11-07 DIAGNOSIS — E785 Hyperlipidemia, unspecified: Secondary | ICD-10-CM | POA: Diagnosis not present

## 2018-11-07 DIAGNOSIS — Z7901 Long term (current) use of anticoagulants: Secondary | ICD-10-CM

## 2018-11-07 DIAGNOSIS — R002 Palpitations: Secondary | ICD-10-CM

## 2018-11-07 MED ORDER — NITROGLYCERIN 0.4 MG SL SUBL: 0.4000 mg | SUBLINGUAL_TABLET | SUBLINGUAL | 11 refills | Status: DC | PRN

## 2018-11-07 NOTE — Progress Notes (Signed)
Cardiology Office Note:    Date:  11/07/2018   ID:  Tina Dominguez, DOB [birth year], MRN [id number]  PCP:  Raelyn Number, MD  Cardiologist:  Shirlee More, MD    Referring MD: Raelyn Number, MD    ASSESSMENT:    1. Chest pain in adult   2. Palpitations   3. Essential hypertension   4. Chronic anticoagulation   5. Hyperlipidemia, unspecified hyperlipidemia type    PLAN:    In order of problems listed above:  1. Symptoms are suggestive of coronary ischemia for quick evaluation Lexiscan myocardial perfusion study.  Nitroglycerin prescribed as needed 2. Quite suggestive and at risk for atrial fibrillation 7-day ambulatory heart rhythm monitor continue anticoagulation 3. Stable continue current treatment 4. Continue anticoagulant with history of venous thromboembolism and pulmonary embolism 5. State continue her statin 6. To continue follow-up vascular surgery for carotid artery restenosis after CEA   Next appointment: 4 weeks   Medication Adjustments/Labs and Tests Ordered: Current medicines are reviewed at length with the patient today.  Concerns regarding medicines are outlined above.  No orders of the defined types were placed in this encounter.  No orders of the defined types were placed in this encounter.   Chief Complaint  Patient presents with  . Follow-up  . Palpitations  . Hypertension  . Chest Pain    History of Present Illness:    Tina Dominguez is a 78 y.o. female with a hx of  hypertension, hyperlipidemia RCEA 2012 with recurrent stenosis and stroke  last seen 01/19/18. Compliance with diet, lifestyle and medications: Yes  She had an echocardiogram performed in her primary care physician's office 10/22/2018.  Left ventricular systolic function was normal EF 60 to 65% otherwise normal echocardiogram.  She is quite concerned regarding her health about a month ago she had a very prolonged severe episode of substernal chest pain radiating through her  back up into her shoulder left side she was weak diaphoretic short of breath thought she had a heart attack but refused to go to the hospital.  Subsequently seen in her PCP office EKG normal echocardiogram normal.  Since then she has had symptoms but is just waxing and waning of very brief momentary substernal discomfort.  Is not exertional in nature she does not have nitroglycerin does not have known CAD.  Her predominant complaint is recurrent episodes where her heart races greater than 120 and blood pressure elevated greater than 200.  I suspect she is having atrial fibrillation she is currently anticoagulated.  For further evaluation she will wear a 7-day ZIO monitor and I asked her undergo myocardial perfusion study at high risk for CAD she been given a prescription for nitroglycerin and will follow-up in the office in 4 weeks. Past Medical History:  Diagnosis Date  . Aftercare following surgery of the circulatory system, [place] 10/14/2013  . Anemia 12/13/2017  . Anxiety 12/13/2017  . Arthritis   . Carotid artery occlusion   . Clotting disorder ([place])   . Depression   . Dysphagia 04/17/2017  . GERD (gastroesophageal reflux disease) 05/26/2011  . GI bleeding 10/04/2017  . Gout 12/13/2017  . Hiatal hernia 12/13/2017  . 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   Marland Kitchen History of recurrent TIAs 12/13/2017  . History of right-sided carotid endarterectomy 10/10/2011  . Hyperlipidemia   . Hypertension   . Leg pain  with walking  . Occlusion and stenosis of carotid artery without mention of cerebral infarction 10/08/2012  . Osteoarthritis 05/26/2011  . Osteoporosis   . PE (pulmonary embolism) 05/26/2011  . 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 Reflux   . Renal failure 12/13/2017  . S/P IVC filter 12/13/2017  . Schatzki's ring 12/13/2017  . Stroke [facility])    X's 4  . Valvular heart disease 12/13/2017     Past Surgical History:  Procedure Laterality Date  . ABDOMINAL HYSTERECTOMY  1980   TAH   . APPENDECTOMY    . BACK SURGERY  06/2011  . BREAST SURGERY     x2  . CAROTID ENDARTERECTOMY  11/11/2010   Right CEA  . IVC FILTER INSERTION    . JOINT REPLACEMENT Right 2005   Right Total Knee  . JOINT REPLACEMENT Left 2005   Left Total Knee  . SHOULDER SURGERY     right  . SPINE SURGERY  07-11-11   Decomp. Laminectomy, fusion of L4-5,L5-S1 by Dr. Saintclair Halsted  . TOTAL KNEE ARTHROPLASTY     right  and left    Current Medications: Current Meds  Medication Sig  . acetaminophen (TYLENOL) 500 MG tablet Take 500 mg by mouth every 6 (six) hours as needed.  Marland Kitchen amLODipine (NORVASC) 5 MG tablet Take 1 tablet (5 mg total) by mouth daily.  . citalopram (CELEXA) 10 MG tablet Take 10 mg by mouth daily.   . cloNIDine (CATAPRES) 0.1 MG tablet Take 1 tablet (0.1 mg total) by mouth at bedtime.  Marland Kitchen levothyroxine (SYNTHROID, LEVOTHROID) 125 MCG tablet Take 125 mcg by mouth daily.  Marland Kitchen linaclotide (LINZESS) 145 MCG CAPS capsule Take 145 mcg by mouth daily before breakfast.  . mirtazapine (REMERON) 30 MG tablet Take 30 mg by mouth daily.  . Multiple Vitamin (MULTIVITAMIN) tablet Take 1 tablet by mouth daily.  Marland Kitchen oxyCODONE-acetaminophen (PERCOCET) 10-325 MG tablet Take 1 tablet by mouth every 6 (six) hours as needed for moderate pain. 10-325 One Tab every 12 hours  . pantoprazole (PROTONIX) 40 MG tablet Take 40 mg by mouth daily.  . pravastatin (PRAVACHOL) 40 MG tablet Take 40 mg by mouth daily.   Alveda Reasons 20 MG TABS tablet Take 20 mg by mouth daily.     Allergies:   Patient has no known allergies.   Social History   Socioeconomic History  . Marital status: Widowed    Spouse name: Not on file  . Number of children: Not on file  . Years of education: Not on file  . Highest education level: Not on file  Occupational History  . Not on file  Social Needs  . Financial resource strain: Not on file  . Food  insecurity:    Worry: Not on file    Inability: Not on file  . Transportation needs:    Medical: Not on file    Non-medical: Not on file  Tobacco Use  . Smoking status: Never Smoker  . Smokeless tobacco: Never Used  Substance and Sexual Activity  . Alcohol use: No  . Drug use: No  . Sexual activity: Not on file  Lifestyle  . Physical activity:    Days per week: Not on file    Minutes per session: Not on file  . Stress: Not on file  Relationships  . Social connections:    Talks on phone: Not on file    Gets together: Not on file    Attends religious service: Not on file  Active member of club or organization: Not on file    Attends meetings of clubs or organizations: Not on file    Relationship status: Not on file  Other Topics Concern  . Not on file  Social History Narrative  . Not on file     Family History: The patient's family history includes Clotting disorder in her mother; Deep vein thrombosis in her mother and sister; Diabetes in her mother and sister; Heart disease in her brother and mother; Heart disease (age of onset: 66) in her father; Hyperlipidemia in her mother; Hypertension in her mother; Stroke in her maternal grandmother; Varicose Veins in her father. ROS:   Please see the history of present illness.    All other systems reviewed and are negative.  EKGs/Labs/Other Studies Reviewed:    The following studies were reviewed today:  EKG:  EKG ordered today.  The ekg ordered today demonstrates sinus rhythm normal  Recent Labs: 01/18/2018: BUN 23; Creatinine, Ser 1.18; Potassium 6.7; Sodium 139  Recent Lipid Panel No results found for: CHOL, TRIG, HDL, CHOLHDL, VLDL, LDLCALC, LDLDIRECT  Physical Exam:    VS:  BP (!) 162/82 (BP Location: Right Arm, Patient Position: Sitting, Cuff Size: Normal)   Pulse 64   Ht 5\' 3"  (1.6 m)   Wt 158 lb 8 oz (71.9 kg)   SpO2 96%   BMI 28.08 kg/m     Wt Readings from Last 3 Encounters:  11/07/18 158 lb 8 oz (71.9 kg)   03/26/18 159 lb (72.1 kg)  01/19/18 154 lb (69.9 kg)     GEN:  Well nourished, well developed in no acute distress HEENT: Normal NECK: No JVD; No carotid bruits LYMPHATICS: No lymphadenopathy CARDIAC: RRR, no murmurs, rubs, gallops RESPIRATORY:  Clear to auscultation without rales, wheezing or rhonchi  ABDOMEN: Soft, non-tender, non-distended MUSCULOSKELETAL:  No edema; No deformity  SKIN: Warm and dry NEUROLOGIC:  Alert and oriented x 3 PSYCHIATRIC:  Normal affect    Signed, Shirlee More, MD  11/07/2018 3:34 PM    [facility]

## 2018-11-07 NOTE — Patient Instructions (Signed)
Medication Instructions:  Your physician has recommended you make the following change in your medication:   START:  Nitroglycerin 0.4mg  one tablet sublingual (under the tongue) as needed for chest pain  When having chest pain, stop what you are doing and sit down. Take 1 nitro, wait 5 minutes. Still having chest pain, take 1 nitro, wait 5 minutes. Still having chest pain, take 1 nitro, dial 911. Total of 3 nitro in 15 minutes.   If you need a refill on your cardiac medications before your next appointment, please call your pharmacy.   Lab work: NONE If you have labs (blood work) drawn today and your tests are completely normal, you will receive your results only by: Marland Kitchen MyChart Message (if you have MyChart) OR . A paper copy in the mail If you have any lab test that is abnormal or we need to change your treatment, we will call you to review the results.  Testing/Procedures: You had an EKG today  Your physician has requested that you have a lexiscan myoview. For further information please visit [email or web address]. Please follow instruction sheet, as given.  Your physician has recommended that you wear a ZIO patch. A ZIO patch is a medical device that record the heart's electrical activity. Doctors most often use these monitors to diagnose arrhythmias. Arrhythmias are problems with the speed or rhythm of the heartbeat. The monitor is a small, portable device. You can wear one while you do your normal daily activities. This is usually used to diagnose what is causing palpitations/syncope (passing out). 1 week ZIO Patch     Follow-Up: At [facility], you and your health needs are our priority.  As part of our continuing mission to provide you with exceptional heart care, we have created designated Provider Care Teams.  These Care Teams include your primary Cardiologist (physician) and Advanced Practice Providers (APPs -  Physician Assistants and Nurse Practitioners) who all work together to  provide you with the care you need, when you need it. You will need a follow up appointment in 4 weeks.     Nitroglycerin sublingual tablets What is this medicine? NITROGLYCERIN (nye troe GLI ser in) is a type of vasodilator. It relaxes blood vessels, increasing the blood and oxygen supply to your heart. This medicine is used to relieve chest pain caused by angina. It is also used to prevent chest pain before activities like climbing stairs, going outdoors in cold weather, or sexual activity. This medicine may be used for other purposes; ask your health care provider or pharmacist if you have questions. COMMON BRAND NAME(S): Nitroquick, Nitrostat, Nitrotab What should I tell my health care provider before I take this medicine? They need to know if you have any of these conditions: -anemia -head injury, recent stroke, or bleeding in the brain -liver disease -previous heart attack -an unusual or allergic reaction to nitroglycerin, other medicines, foods, dyes, or preservatives -pregnant or trying to get pregnant -breast-feeding How should I use this medicine? Take this medicine by mouth as needed. At the first sign of an angina attack (chest pain or tightness) place one tablet under your tongue. You can also take this medicine 5 to 10 minutes before an event likely to produce chest pain. Follow the directions on the prescription label. Let the tablet dissolve under the tongue. Do not swallow whole. Replace the dose if you accidentally swallow it. It will help if your mouth is not dry. Saliva around the tablet will help it to dissolve more quickly.  Do not eat or drink, smoke or chew tobacco while a tablet is dissolving. If you are not better within 5 minutes after taking ONE dose of nitroglycerin, call 9-1-1 immediately to seek emergency medical care. Do not take more than 3 nitroglycerin tablets over 15 minutes. If you take this medicine often to relieve symptoms of angina, your doctor or health  care professional may provide you with different instructions to manage your symptoms. If symptoms do not go away after following these instructions, it is important to call 9-1-1 immediately. Do not take more than 3 nitroglycerin tablets over 15 minutes. Talk to your pediatrician regarding the use of this medicine in children. Special care may be needed. Overdosage: If you think you have taken too much of this medicine contact a poison control center or emergency room at once. NOTE: This medicine is only for you. Do not share this medicine with others. What if I miss a dose? This does not apply. This medicine is only used as needed. What may interact with this medicine? Do not take this medicine with any of the following medications: -certain migraine medicines like ergotamine and dihydroergotamine (DHE) -medicines used to treat erectile dysfunction like sildenafil, tadalafil, and vardenafil -riociguat This medicine may also interact with the following medications: -alteplase -aspirin -heparin -medicines for high blood pressure -medicines for mental depression -other medicines used to treat angina -phenothiazines like chlorpromazine, mesoridazine, prochlorperazine, thioridazine This list may not describe all possible interactions. Give your health care provider a list of all the medicines, herbs, non-prescription drugs, or dietary supplements you use. Also tell them if you smoke, drink alcohol, or use illegal drugs. Some items may interact with your medicine. What should I watch for while using this medicine? Tell your doctor or health care professional if you feel your medicine is no longer working. Keep this medicine with you at all times. Sit or lie down when you take your medicine to prevent falling if you feel dizzy or faint after using it. Try to remain calm. This will help you to feel better faster. If you feel dizzy, take several deep breaths and lie down with your feet propped up, or  bend forward with your head resting between your knees. You may get drowsy or dizzy. Do not drive, use machinery, or do anything that needs mental alertness until you know how this drug affects you. Do not stand or sit up quickly, especially if you are an older patient. This reduces the risk of dizzy or fainting spells. Alcohol can make you more drowsy and dizzy. Avoid alcoholic drinks. Do not treat yourself for coughs, colds, or pain while you are taking this medicine without asking your doctor or health care professional for advice. Some ingredients may increase your blood pressure. What side effects may I notice from receiving this medicine? Side effects that you should report to your doctor or health care professional as soon as possible: -blurred vision -dry mouth -skin rash -sweating -the feeling of extreme pressure in the head -unusually weak or tired Side effects that usually do not require medical attention (report to your doctor or health care professional if they continue or are bothersome): -flushing of the face or neck -headache -irregular heartbeat, palpitations -nausea, vomiting This list may not describe all possible side effects. Call your doctor for medical advice about side effects. You may report side effects to FDA at 1-800-FDA-1088. Where should I keep my medicine? Keep out of the reach of children. Store at room temperature between  20 and 25 degrees C (68 and 77 degrees F). Store in [facility] [employer]. Protect from light and moisture. Keep tightly closed. Throw away any unused medicine after the expiration date. NOTE: This sheet is a summary. It may not cover all possible information. If you have questions about this medicine, talk to your doctor, pharmacist, or health care provider.  2019 Elsevier/Gold Standard (2013-07-11 17:57:36)

## 2018-11-09 ENCOUNTER — Ambulatory Visit (INDEPENDENT_AMBULATORY_CARE_PROVIDER_SITE_OTHER): Payer: PPO

## 2018-11-09 DIAGNOSIS — R002 Palpitations: Secondary | ICD-10-CM

## 2018-11-20 ENCOUNTER — Ambulatory Visit: Payer: PPO | Admitting: Cardiology

## 2018-11-22 DIAGNOSIS — J441 Chronic obstructive pulmonary disease with (acute) exacerbation: Secondary | ICD-10-CM | POA: Diagnosis not present

## 2018-11-22 DIAGNOSIS — M158 Other polyosteoarthritis: Secondary | ICD-10-CM | POA: Diagnosis not present

## 2018-11-22 DIAGNOSIS — Z5181 Encounter for therapeutic drug level monitoring: Secondary | ICD-10-CM | POA: Diagnosis not present

## 2018-11-22 DIAGNOSIS — E875 Hyperkalemia: Secondary | ICD-10-CM | POA: Diagnosis not present

## 2018-11-22 DIAGNOSIS — D6851 Activated protein C resistance: Secondary | ICD-10-CM | POA: Diagnosis not present

## 2018-11-28 ENCOUNTER — Telehealth: Payer: Self-pay | Admitting: *Deleted

## 2018-11-28 NOTE — Telephone Encounter (Signed)
Patient given detailed instructions per Myocardial Perfusion Study Information Sheet for the test on 12/04/18 at 0830. Patient notified to arrive 15 minutes early and that it is imperative to arrive on time for appointment to keep from having the test rescheduled.  If you need to cancel or reschedule your appointment, please call the office within 24 hours of your appointment. . Patient verbalized understanding.Lidie Glade, Ranae Palms Patient has letter

## 2018-12-04 ENCOUNTER — Ambulatory Visit (INDEPENDENT_AMBULATORY_CARE_PROVIDER_SITE_OTHER): Payer: PPO

## 2018-12-04 VITALS — Ht 63.0 in | Wt 158.0 lb

## 2018-12-04 DIAGNOSIS — R002 Palpitations: Secondary | ICD-10-CM

## 2018-12-04 DIAGNOSIS — R079 Chest pain, unspecified: Secondary | ICD-10-CM | POA: Diagnosis not present

## 2018-12-04 LAB — MYOCARDIAL PERFUSION IMAGING
CHL CUP NUCLEAR SDS: 3
LV dias vol: 73 mL (ref 46–106)
LV sys vol: 31 mL
Peak HR: 83 {beats}/min
Rest HR: 67 {beats}/min
SRS: 5
SSS: 8
TID: 1.08

## 2018-12-04 MED ORDER — TECHNETIUM TC 99M TETROFOSMIN IV KIT
29.4000 | PACK | Freq: Once | INTRAVENOUS | Status: AC | PRN
  Administered 2018-12-04: 29.4 via INTRAVENOUS

## 2018-12-04 MED ORDER — REGADENOSON 0.4 MG/5ML IV SOLN
0.4000 mg | Freq: Once | INTRAVENOUS | Status: AC
  Administered 2018-12-04: 0.4 mg via INTRAVENOUS

## 2018-12-04 MED ORDER — TECHNETIUM TC 99M TETROFOSMIN IV KIT
10.8000 | PACK | Freq: Once | INTRAVENOUS | Status: AC | PRN
  Administered 2018-12-04: 10.8 via INTRAVENOUS

## 2018-12-04 NOTE — H&P (View-Only) (Signed)
Cardiology Office Note:    Date:  12/05/2018   ID:  Tina Dominguez, DOB 10-11-1940, MRN [id number]  PCP:  Raelyn Number, MD  Cardiologist:  Shirlee More, MD    Referring MD: Raelyn Number, MD    ASSESSMENT:    1. Abnormal myocardial perfusion study   2. Chest pain in adult   3. Essential hypertension   4. Hyperlipidemia, unspecified hyperlipidemia type   5. Chronic anticoagulation   6. PAT (paroxysmal atrial tachycardia) (HCC)    PLAN:    In order of problems listed above:  1. See discussion under history refer to coronary angiography 2. See above typical angina pattern has progressed and is not quite unstable yet 3. Stable continue current treatment 4. Continue her statin 5. Continue anticoagulation with venous thromboembolism hypercoagulable state 6. Initiate beta-blocker   Next appointment: 6 weeks   Medication Adjustments/Labs and Tests Ordered: Current medicines are reviewed at length with the patient today.  Concerns regarding medicines are outlined above.  Orders Placed This Encounter  Procedures  . EKG 12-Lead   No orders of the defined types were placed in this encounter.   Chief Complaint  Patient presents with  . Follow-up    after testing    History of Present Illness:    Tina Dominguez is a 78 y.o. female with a hx of hypertension, hyperlipidemia RCEA 2012 with recurrent stenosis and stroke  last seen 11/07/18 with chest pain.. Compliance with diet, lifestyle and medications: Yes  She is seen in the office along with her son where good interaction give-and-take reviewing the risk benefits options and decided to proceed with coronary angiography understanding that she is on chronic anticoagulants for venous thromboembolism and an upper GI bleed due to ulcer disease January 2019 and a stage III CKD.  Since the last visit she has been troubled by recurrent episodes of rapid heart rhythm and she had a particularly severe sustained episode after  removing her event monitor and was found to have atrial tachycardia.  She continues to have episodes of nonexertional anginal discomfort substernal pain discomfort radiates to the back and up into the jaw bilaterally.  She has not needed nitroglycerin the episodes have been brief and occur at rest several times per week.  She is high risk with her vascular disease hypertension hyperlipidemia is having symptoms that are concerning but not quite unstable and has an abnormal pharmacologic perfusion study.  After nice discussion she decides and I agreed to proceed to coronary angiography.  Should be hydrated ahead of time with a renal insufficiency and I suspect she will have PCI and I will give her clopidogrel prior to the procedure as she would be on a single antiplatelet agent anticoagulant afterwards she has been having heartburn indigestion may increase the dose of her PPI to twice daily.  No edema shortness of breath syncope or TIA she has had no GI bleeding and no allergy to contrast dye  12/04/18:   Study Highlights   Nuclear stress EF: 58%.  There was no ST segment deviation noted during stress.  Defect 1: There is a medium defect of moderate severity present in the basal inferior, mid inferior and apical inferior location.  Findings consistent with ischemia.  This is an intermediate risk study.  The left ventricular ejection fraction is normal (55-65%).  Significant breast attenuation suggested on this study. This makes that study fair quality.   Study Highlights   A Zio heart monitor was utilized for  7 days beginning 11/09/2018 to assess palpitation suspected atrial fibrillation   The rhythm throughout is sinus minimum average and maximum heart rates of 45, 62 and 115 bpm.   There were no bradycardic events of pauses greater than 3 seconds high degree AV block or sinus node exit block seen.   Ventricular ectopy is rare with isolated PVCs   Supraventricular ectopy is rare with  isolated APCs couplets and triplets.  There were 8 episodes of runs of atrial premature beats and atrial tachycardia the longest and the fastest PAT was 15 APCs at a rate of 142 bpm there were no episodes of atrial fibrillation  Flutter.   There were 2 diary events associated sinus rhythm and there were 3 triggered events associated with isolated APCs PVCs and sinus rhythm.     Conclusion, brief runs of atrial tachycardia longest and fastest 15 complexes rate 142 bpm    Past Medical History:  Diagnosis Date  . Aftercare following surgery of the circulatory system, [place] 10/14/2013  . Anemia 12/13/2017  . Anxiety 12/13/2017  . Arthritis   . Carotid artery occlusion   . Clotting disorder ([place])   . Depression   . Dysphagia 04/17/2017  . GERD (gastroesophageal reflux disease) 05/26/2011  . GI bleeding 10/04/2017  . Gout 12/13/2017  . Hiatal hernia 12/13/2017  . 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   Marland Kitchen History of recurrent TIAs 12/13/2017  . History of right-sided carotid endarterectomy 10/10/2011  . Hyperlipidemia   . Hypertension   . Leg pain    with walking  . Occlusion and stenosis of carotid artery without mention of cerebral infarction 10/08/2012  . Osteoarthritis 05/26/2011  . Osteoporosis   . PE (pulmonary embolism) 05/26/2011  . 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 Reflux   . Renal failure 12/13/2017  . S/P IVC filter 12/13/2017  . Schatzki's ring 12/13/2017  . Stroke [facility])    X's 4  . Valvular heart disease 12/13/2017    Past Surgical History:  Procedure Laterality Date  . ABDOMINAL HYSTERECTOMY  1980   TAH   . APPENDECTOMY    . BACK SURGERY  06/2011  . BREAST SURGERY     x2  . CAROTID ENDARTERECTOMY  11/11/2010   Right CEA  . IVC FILTER INSERTION    . JOINT REPLACEMENT Right 2005   Right Total Knee  . JOINT REPLACEMENT Left 2005   Left Total Knee  . SHOULDER  SURGERY     right  . SPINE SURGERY  07-11-11   Decomp. Laminectomy, fusion of L4-5,L5-S1 by Dr. Saintclair Halsted  . TOTAL KNEE ARTHROPLASTY     right  and left    Current Medications: Current Meds  Medication Sig  . acetaminophen (TYLENOL) 500 MG tablet Take 500 mg by mouth every 6 (six) hours as needed.  Marland Kitchen amLODipine (NORVASC) 5 MG tablet Take 1 tablet (5 mg total) by mouth daily.  . citalopram (CELEXA) 10 MG tablet Take 10 mg by mouth daily.   . citalopram (CELEXA) 40 MG tablet TK 1 T PO QD  . cloNIDine (CATAPRES) 0.1 MG tablet Take 1 tablet (0.1 mg total) by mouth at bedtime.  Marland Kitchen levothyroxine (SYNTHROID, LEVOTHROID) 125 MCG tablet Take 125 mcg by mouth daily.  Marland Kitchen linaclotide (LINZESS) 145 MCG CAPS capsule Take 145 mcg by mouth daily before breakfast.  . mirtazapine (REMERON) 30 MG tablet Take  30 mg by mouth daily.  . Multiple Vitamin (MULTIVITAMIN) tablet Take 1 tablet by mouth daily.  . nitroGLYCERIN (NITROSTAT) 0.4 MG SL tablet Place 1 tablet (0.4 mg total) under the tongue every 5 (five) minutes as needed for chest pain.  . Oxycodone HCl 10 MG TABS TK 1 T PO Q 6 H PRN  . pantoprazole (PROTONIX) 40 MG tablet Take 40 mg by mouth daily.  . pravastatin (PRAVACHOL) 40 MG tablet Take 40 mg by mouth daily.   Alveda Reasons 20 MG TABS tablet Take 20 mg by mouth daily.     Allergies:   Patient has no known allergies.   Social History   Socioeconomic History  . Marital status: Widowed    Spouse name: Not on file  . Number of children: Not on file  . Years of education: Not on file  . Highest education level: Not on file  Occupational History  . Not on file  Social Needs  . Financial resource strain: Not on file  . Food insecurity:    Worry: Not on file    Inability: Not on file  . Transportation needs:    Medical: Not on file    Non-medical: Not on file  Tobacco Use  . Smoking status: Never Smoker  . Smokeless tobacco: Never Used  Substance and Sexual Activity  . Alcohol use: No  . Drug  use: No  . Sexual activity: Not on file  Lifestyle  . Physical activity:    Days per week: Not on file    Minutes per session: Not on file  . Stress: Not on file  Relationships  . Social connections:    Talks on phone: Not on file    Gets together: Not on file    Attends religious service: Not on file    Active member of club or organization: Not on file    Attends meetings of clubs or organizations: Not on file    Relationship status: Not on file  Other Topics Concern  . Not on file  Social History Narrative  . Not on file     Family History: The patient's family history includes Clotting disorder in her mother; Deep vein thrombosis in her mother and sister; Diabetes in her mother and sister; Heart disease in her brother and mother; Heart disease (age of onset: 63) in her father; Hyperlipidemia in her mother; Hypertension in her mother; Stroke in her maternal grandmother; Varicose Veins in her father. ROS:   Please see the history of present illness.    All other systems reviewed and are negative.  EKGs/Labs/Other Studies Reviewed:    The following studies were reviewed today:  EKG:  EKG ordered today and personally reviewed.  The ekg ordered today demonstrates sinus rhythm LVH voltage otherwise normal  Recent Labs: 01/18/2018: BUN 23; Creatinine, Ser 1.18; Potassium 6.7; Sodium 139  Recent Lipid Panel No results found for: CHOL, TRIG, HDL, CHOLHDL, VLDL, LDLCALC, LDLDIRECT  Physical Exam:    VS:  BP 126/80 (BP Location: Right Arm, Patient Position: Sitting, Cuff Size: Large)   Pulse 61   Ht 5\' 3"  (1.6 m)   Wt 160 lb 6.4 oz (72.8 kg)   SpO2 97%   BMI 28.41 kg/m     Wt Readings from Last 3 Encounters:  12/05/18 160 lb 6.4 oz (72.8 kg)  12/04/18 158 lb (71.7 kg)  11/07/18 158 lb 8 oz (71.9 kg)     GEN:  Well nourished, well developed in no acute  distress HEENT: Normal NECK: No JVD; No carotid bruits LYMPHATICS: No lymphadenopathy CARDIAC: RRR, no murmurs, rubs,  gallops RESPIRATORY:  Clear to auscultation without rales, wheezing or rhonchi  ABDOMEN: Soft, non-tender, non-distended MUSCULOSKELETAL:  No edema; No deformity  SKIN: Warm and dry NEUROLOGIC:  Alert and oriented x 3 PSYCHIATRIC:  Normal affect    Signed, Shirlee More, MD  12/05/2018 11:00 AM    [place]

## 2018-12-04 NOTE — Progress Notes (Signed)
Cardiology Office Note:    Date:  12/05/2018   ID:  Tina Dominguez, DOB December 11, 1940, MRN [id number]  PCP:  Raelyn Number, MD  Cardiologist:  Shirlee More, MD    Referring MD: Raelyn Number, MD    ASSESSMENT:    1. Abnormal myocardial perfusion study   2. Chest pain in adult   3. Essential hypertension   4. Hyperlipidemia, unspecified hyperlipidemia type   5. Chronic anticoagulation   6. PAT (paroxysmal atrial tachycardia) (HCC)    PLAN:    In order of problems listed above:  1. See discussion under history refer to coronary angiography 2. See above typical angina pattern has progressed and is not quite unstable yet 3. Stable continue current treatment 4. Continue her statin 5. Continue anticoagulation with venous thromboembolism hypercoagulable state 6. Initiate beta-blocker   Next appointment: 6 weeks   Medication Adjustments/Labs and Tests Ordered: Current medicines are reviewed at length with the patient today.  Concerns regarding medicines are outlined above.  Orders Placed This Encounter  Procedures  . EKG 12-Lead   No orders of the defined types were placed in this encounter.   Chief Complaint  Patient presents with  . Follow-up    after testing    History of Present Illness:    Tina Dominguez is a 78 y.o. female with a hx of hypertension, hyperlipidemia RCEA 2012 with recurrent stenosis and stroke  last seen 11/07/18 with chest pain.. Compliance with diet, lifestyle and medications: Yes  She is seen in the office along with her son where good interaction give-and-take reviewing the risk benefits options and decided to proceed with coronary angiography understanding that she is on chronic anticoagulants for venous thromboembolism and an upper GI bleed due to ulcer disease January 2019 and a stage III CKD.  Since the last visit she has been troubled by recurrent episodes of rapid heart rhythm and she had a particularly severe sustained episode after  removing her event monitor and was found to have atrial tachycardia.  She continues to have episodes of nonexertional anginal discomfort substernal pain discomfort radiates to the back and up into the jaw bilaterally.  She has not needed nitroglycerin the episodes have been brief and occur at rest several times per week.  She is high risk with her vascular disease hypertension hyperlipidemia is having symptoms that are concerning but not quite unstable and has an abnormal pharmacologic perfusion study.  After nice discussion she decides and I agreed to proceed to coronary angiography.  Should be hydrated ahead of time with a renal insufficiency and I suspect she will have PCI and I will give her clopidogrel prior to the procedure as she would be on a single antiplatelet agent anticoagulant afterwards she has been having heartburn indigestion may increase the dose of her PPI to twice daily.  No edema shortness of breath syncope or TIA she has had no GI bleeding and no allergy to contrast dye  12/04/18:   Study Highlights   Nuclear stress EF: 58%.  There was no ST segment deviation noted during stress.  Defect 1: There is a medium defect of moderate severity present in the basal inferior, mid inferior and apical inferior location.  Findings consistent with ischemia.  This is an intermediate risk study.  The left ventricular ejection fraction is normal (55-65%).  Significant breast attenuation suggested on this study. This makes that study fair quality.   Study Highlights   A Zio heart monitor was utilized for  7 days beginning 11/09/2018 to assess palpitation suspected atrial fibrillation   The rhythm throughout is sinus minimum average and maximum heart rates of 45, 62 and 115 bpm.   There were no bradycardic events of pauses greater than 3 seconds high degree AV block or sinus node exit block seen.   Ventricular ectopy is rare with isolated PVCs   Supraventricular ectopy is rare with  isolated APCs couplets and triplets.  There were 8 episodes of runs of atrial premature beats and atrial tachycardia the longest and the fastest PAT was 15 APCs at a rate of 142 bpm there were no episodes of atrial fibrillation  Flutter.   There were 2 diary events associated sinus rhythm and there were 3 triggered events associated with isolated APCs PVCs and sinus rhythm.     Conclusion, brief runs of atrial tachycardia longest and fastest 15 complexes rate 142 bpm    Past Medical History:  Diagnosis Date  . Aftercare following surgery of the circulatory system, [place] 10/14/2013  . Anemia 12/13/2017  . Anxiety 12/13/2017  . Arthritis   . Carotid artery occlusion   . Clotting disorder ([place])   . Depression   . Dysphagia 04/17/2017  . GERD (gastroesophageal reflux disease) 05/26/2011  . GI bleeding 10/04/2017  . Gout 12/13/2017  . Hiatal hernia 12/13/2017  . 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   Marland Kitchen History of recurrent TIAs 12/13/2017  . History of right-sided carotid endarterectomy 10/10/2011  . Hyperlipidemia   . Hypertension   . Leg pain    with walking  . Occlusion and stenosis of carotid artery without mention of cerebral infarction 10/08/2012  . Osteoarthritis 05/26/2011  . Osteoporosis   . PE (pulmonary embolism) 05/26/2011  . 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 Reflux   . Renal failure 12/13/2017  . S/P IVC filter 12/13/2017  . Schatzki's ring 12/13/2017  . Stroke [facility])    X's 4  . Valvular heart disease 12/13/2017    Past Surgical History:  Procedure Laterality Date  . ABDOMINAL HYSTERECTOMY  1980   TAH   . APPENDECTOMY    . BACK SURGERY  06/2011  . BREAST SURGERY     x2  . CAROTID ENDARTERECTOMY  11/11/2010   Right CEA  . IVC FILTER INSERTION    . JOINT REPLACEMENT Right 2005   Right Total Knee  . JOINT REPLACEMENT Left 2005   Left Total Knee  . SHOULDER  SURGERY     right  . SPINE SURGERY  07-11-11   Decomp. Laminectomy, fusion of L4-5,L5-S1 by Dr. Saintclair Halsted  . TOTAL KNEE ARTHROPLASTY     right  and left    Current Medications: Current Meds  Medication Sig  . acetaminophen (TYLENOL) 500 MG tablet Take 500 mg by mouth every 6 (six) hours as needed.  Marland Kitchen amLODipine (NORVASC) 5 MG tablet Take 1 tablet (5 mg total) by mouth daily.  . citalopram (CELEXA) 10 MG tablet Take 10 mg by mouth daily.   . citalopram (CELEXA) 40 MG tablet TK 1 T PO QD  . cloNIDine (CATAPRES) 0.1 MG tablet Take 1 tablet (0.1 mg total) by mouth at bedtime.  Marland Kitchen levothyroxine (SYNTHROID, LEVOTHROID) 125 MCG tablet Take 125 mcg by mouth daily.  Marland Kitchen linaclotide (LINZESS) 145 MCG CAPS capsule Take 145 mcg by mouth daily before breakfast.  . mirtazapine (REMERON) 30 MG tablet Take  30 mg by mouth daily.  . Multiple Vitamin (MULTIVITAMIN) tablet Take 1 tablet by mouth daily.  . nitroGLYCERIN (NITROSTAT) 0.4 MG SL tablet Place 1 tablet (0.4 mg total) under the tongue every 5 (five) minutes as needed for chest pain.  . Oxycodone HCl 10 MG TABS TK 1 T PO Q 6 H PRN  . pantoprazole (PROTONIX) 40 MG tablet Take 40 mg by mouth daily.  . pravastatin (PRAVACHOL) 40 MG tablet Take 40 mg by mouth daily.   Alveda Reasons 20 MG TABS tablet Take 20 mg by mouth daily.     Allergies:   Patient has no known allergies.   Social History   Socioeconomic History  . Marital status: Widowed    Spouse name: Not on file  . Number of children: Not on file  . Years of education: Not on file  . Highest education level: Not on file  Occupational History  . Not on file  Social Needs  . Financial resource strain: Not on file  . Food insecurity:    Worry: Not on file    Inability: Not on file  . Transportation needs:    Medical: Not on file    Non-medical: Not on file  Tobacco Use  . Smoking status: Never Smoker  . Smokeless tobacco: Never Used  Substance and Sexual Activity  . Alcohol use: No  . Drug  use: No  . Sexual activity: Not on file  Lifestyle  . Physical activity:    Days per week: Not on file    Minutes per session: Not on file  . Stress: Not on file  Relationships  . Social connections:    Talks on phone: Not on file    Gets together: Not on file    Attends religious service: Not on file    Active member of club or organization: Not on file    Attends meetings of clubs or organizations: Not on file    Relationship status: Not on file  Other Topics Concern  . Not on file  Social History Narrative  . Not on file     Family History: The patient's family history includes Clotting disorder in her mother; Deep vein thrombosis in her mother and sister; Diabetes in her mother and sister; Heart disease in her brother and mother; Heart disease (age of onset: 76) in her father; Hyperlipidemia in her mother; Hypertension in her mother; Stroke in her maternal grandmother; Varicose Veins in her father. ROS:   Please see the history of present illness.    All other systems reviewed and are negative.  EKGs/Labs/Other Studies Reviewed:    The following studies were reviewed today:  EKG:  EKG ordered today and personally reviewed.  The ekg ordered today demonstrates sinus rhythm LVH voltage otherwise normal  Recent Labs: 01/18/2018: BUN 23; Creatinine, Ser 1.18; Potassium 6.7; Sodium 139  Recent Lipid Panel No results found for: CHOL, TRIG, HDL, CHOLHDL, VLDL, LDLCALC, LDLDIRECT  Physical Exam:    VS:  BP 126/80 (BP Location: Right Arm, Patient Position: Sitting, Cuff Size: Large)   Pulse 61   Ht 5\' 3"  (1.6 m)   Wt 160 lb 6.4 oz (72.8 kg)   SpO2 97%   BMI 28.41 kg/m     Wt Readings from Last 3 Encounters:  12/05/18 160 lb 6.4 oz (72.8 kg)  12/04/18 158 lb (71.7 kg)  11/07/18 158 lb 8 oz (71.9 kg)     GEN:  Well nourished, well developed in no acute  distress HEENT: Normal NECK: No JVD; No carotid bruits LYMPHATICS: No lymphadenopathy CARDIAC: RRR, no murmurs, rubs,  gallops RESPIRATORY:  Clear to auscultation without rales, wheezing or rhonchi  ABDOMEN: Soft, non-tender, non-distended MUSCULOSKELETAL:  No edema; No deformity  SKIN: Warm and dry NEUROLOGIC:  Alert and oriented x 3 PSYCHIATRIC:  Normal affect    Signed, Shirlee More, MD  12/05/2018 11:00 AM    [place]

## 2018-12-05 ENCOUNTER — Other Ambulatory Visit: Payer: Self-pay | Admitting: Cardiology

## 2018-12-05 ENCOUNTER — Encounter: Payer: Self-pay | Admitting: Cardiology

## 2018-12-05 ENCOUNTER — Ambulatory Visit (INDEPENDENT_AMBULATORY_CARE_PROVIDER_SITE_OTHER): Payer: PPO | Admitting: Cardiology

## 2018-12-05 ENCOUNTER — Other Ambulatory Visit: Payer: Self-pay

## 2018-12-05 ENCOUNTER — Telehealth: Payer: Self-pay | Admitting: Cardiology

## 2018-12-05 VITALS — BP 126/80 | HR 61 | Ht 63.0 in | Wt 160.4 lb

## 2018-12-05 DIAGNOSIS — Z01818 Encounter for other preprocedural examination: Secondary | ICD-10-CM | POA: Diagnosis not present

## 2018-12-05 DIAGNOSIS — R9439 Abnormal result of other cardiovascular function study: Secondary | ICD-10-CM | POA: Diagnosis not present

## 2018-12-05 DIAGNOSIS — I4719 Other supraventricular tachycardia: Secondary | ICD-10-CM

## 2018-12-05 DIAGNOSIS — E785 Hyperlipidemia, unspecified: Secondary | ICD-10-CM

## 2018-12-05 DIAGNOSIS — R079 Chest pain, unspecified: Secondary | ICD-10-CM

## 2018-12-05 DIAGNOSIS — I10 Essential (primary) hypertension: Secondary | ICD-10-CM | POA: Diagnosis not present

## 2018-12-05 DIAGNOSIS — Z7901 Long term (current) use of anticoagulants: Secondary | ICD-10-CM

## 2018-12-05 DIAGNOSIS — K449 Diaphragmatic hernia without obstruction or gangrene: Secondary | ICD-10-CM | POA: Diagnosis not present

## 2018-12-05 DIAGNOSIS — I471 Supraventricular tachycardia: Secondary | ICD-10-CM

## 2018-12-05 HISTORY — DX: Supraventricular tachycardia: I47.1

## 2018-12-05 HISTORY — DX: Other supraventricular tachycardia: I47.19

## 2018-12-05 LAB — CBC
Hematocrit: 38.6 % (ref 34.0–46.6)
Hemoglobin: 13.2 g/dL (ref 11.1–15.9)
MCH: 30.6 pg (ref 26.6–33.0)
MCHC: 34.2 g/dL (ref 31.5–35.7)
MCV: 90 fL (ref 79–97)
PLATELETS: 412 10*3/uL (ref 150–450)
RBC: 4.31 x10E6/uL (ref 3.77–5.28)
RDW: 12.5 % (ref 11.7–15.4)
WBC: 7.3 10*3/uL (ref 3.4–10.8)

## 2018-12-05 LAB — BASIC METABOLIC PANEL
BUN/Creatinine Ratio: 19 (ref 12–28)
BUN: 17 mg/dL (ref 8–27)
CO2: 19 mmol/L — ABNORMAL LOW (ref 20–29)
Calcium: 9.6 mg/dL (ref 8.7–10.3)
Chloride: 107 mmol/L — ABNORMAL HIGH (ref 96–106)
Creatinine, Ser: 0.91 mg/dL (ref 0.57–1.00)
GFR calc Af Amer: 71 mL/min/{1.73_m2} (ref >59)
GFR calc non Af Amer: 62 mL/min/{1.73_m2} (ref >59)
GLUCOSE: 92 mg/dL (ref 65–99)
POTASSIUM: 4.4 mmol/L (ref 3.5–5.2)
Sodium: 142 mmol/L (ref 134–144)

## 2018-12-05 MED ORDER — PANTOPRAZOLE SODIUM 40 MG PO TBEC: DELAYED_RELEASE_TABLET | ORAL | 0 refills | Status: DC

## 2018-12-05 MED ORDER — PANTOPRAZOLE SODIUM 40 MG PO TBEC: 40.0000 mg | DELAYED_RELEASE_TABLET | Freq: Two times a day (BID) | ORAL | 1 refills | Status: DC

## 2018-12-05 MED ORDER — METOPROLOL SUCCINATE ER 25 MG PO TB24: 25.0000 mg | ORAL_TABLET | Freq: Every day | ORAL | 1 refills | Status: DC

## 2018-12-05 NOTE — Telephone Encounter (Signed)
90 day supply of protonix sent to [facility] in [place] off [facility] as requested.

## 2018-12-05 NOTE — Addendum Note (Signed)
Addended by: Austin Miles on: 12/05/2018 04:36 PM   Modules accepted: Orders

## 2018-12-05 NOTE — Telephone Encounter (Signed)
Call #[phone] of metoprolol and pantoprazole to [facility] [place] ([address])

## 2018-12-05 NOTE — Patient Instructions (Addendum)
Medication Instructions:  Your physician has recommended you make the following change in your medication:   INCREASE pantoprazole (protonix) 40 mg: Take 1 tablet twice daily with food  START metoprolol succinate (Toprol XL) 25 mg: Take 1 tablet daily   If you need a refill on your cardiac medications before your next appointment, please call your pharmacy.   Lab work: Your physician recommends that you return for lab work today: CBC, BMP.   If you have labs (blood work) drawn today and your tests are completely normal, you will receive your results only by: Marland Kitchen MyChart Message (if you have MyChart) OR . A paper copy in the mail If you have any lab test that is abnormal or we need to change your treatment, we will call you to review the results.  Testing/Procedures: You had an EKG today.   A chest x-ray takes a picture of the organs and structures inside the chest, including the heart, lungs, and blood vessels. This test can show several things, including, whether the heart is enlarges; whether fluid is building up in the lungs; and whether pacemaker / defibrillator leads are still in place.     [place] [id number] Dept: [phone] Loc: [place]  12/05/2018  You are scheduled for a Cardiac Catheterization on Monday, March 16 with Dr. Larae Grooms.  1. Please arrive at the [facility] (Main Entrance A) at [facility]: [address] [place], [place] [address] at 5:30 AM (This time is two hours before your procedure to ensure your preparation). Free valet parking service is available.   Special note: Every effort is made to have your procedure done on time. Please understand that emergencies sometimes delay scheduled procedures.  2. Diet: Do not eat solid foods after midnight.  The patient may have clear liquids until 5am upon the day of  the procedure.  3. Labs: None needed.   4. Medication instructions in preparation for your procedure:   Contrast Allergy: No   Stop taking Xarelto (Rivaroxaban) on Saturday, March 14.   On the morning of your procedure, take your Aspirin and any morning medicines NOT listed above.  You may use sips of water.  5. Plan for one night stay--bring personal belongings. 6. Bring a current list of your medications and current insurance cards. 7. You MUST have a responsible person to drive you home. 8. Someone MUST be with you the first 24 hours after you arrive home or your discharge will be delayed. 9. Please wear clothes that are easy to get on and off and wear slip-on shoes.  Thank you for allowing [place] to care for you!   -- [facility]    Follow-Up: At [facility], you and your health needs are our priority.  As part of our continuing mission to provide you with exceptional heart care, we have created designated Provider Care Teams.  These Care Teams include your primary Cardiologist (physician) and Advanced Practice Providers (APPs -  Physician Assistants and Nurse Practitioners) who all work together to provide you with the care you need, when you need it. You will need a follow up appointment in 6 weeks.       Coronary Angiogram With Stent Coronary angiogram with stent placement is a procedure to widen or open a narrow blood vessel of the heart (coronary artery). Arteries may become blocked by cholesterol buildup (  plaques) in the lining of the wall. When a coronary artery becomes partially blocked, blood flow to that area decreases. This may lead to chest pain or a heart attack (myocardial infarction). A stent is a small piece of metal that looks like mesh or a spring. Stent placement may be done as treatment for a heart attack or right after a coronary angiogram in which a blocked artery is found. Let your health care provider know about:  Any  allergies you have.  All medicines you are taking, including vitamins, herbs, eye drops, creams, and over-the-counter medicines.  Any problems you or family members have had with anesthetic medicines.  Any blood disorders you have.  Any surgeries you have had.  Any medical conditions you have.  Whether you are pregnant or may be pregnant. What are the risks? Generally, this is a safe procedure. However, problems may occur, including:  Damage to the heart or its blood vessels.  A return of blockage.  Bleeding, infection, or bruising at the insertion site.  A collection of blood under the skin (hematoma) at the insertion site.  A blood clot in another part of the body.  Kidney injury.  Allergic reaction to the dye or contrast that is used.  Bleeding into the abdomen (retroperitoneal bleeding). What happens before the procedure? Staying hydrated Follow instructions from your health care provider about hydration, which may include:  Up to 2 hours before the procedure - you may continue to drink clear liquids, such as water, clear fruit juice, black coffee, and plain tea.  Eating and drinking restrictions Follow instructions from your health care provider about eating and drinking, which may include:  8 hours before the procedure - stop eating heavy meals or foods such as meat, fried foods, or fatty foods.  6 hours before the procedure - stop eating light meals or foods, such as toast or cereal.  2 hours before the procedure - stop drinking clear liquids. Ask your health care provider about:  Changing or stopping your regular medicines. This is especially important if you are taking diabetes medicines or blood thinners.  Taking medicines such as ibuprofen. These medicines can thin your blood. Do not take these medicines before your procedure if your health care provider instructs you not to. Generally, aspirin is recommended before a procedure of passing a small, thin tube  (catheter) through a blood vessel and into the heart (cardiac catheterization). What happens during the procedure?   An IV tube will be inserted into one of your veins.  You will be given one or more of the following: ? A medicine to help you relax (sedative). ? A medicine to numb the area where the catheter will be inserted into an artery (local anesthetic).  To reduce your risk of infection: ? Your health care team will wash or sanitize their hands. ? Your skin will be washed with soap. ? Hair may be removed from the area where the catheter will be inserted.  Using a guide wire, the catheter will be inserted into an artery. The location may be in your groin, in your wrist, or in the fold of your arm (near your elbow).  A type of X-ray (fluoroscopy) will be used to help guide the catheter to the opening of the arteries in the heart.  A dye will be injected into the catheter, and X-rays will be taken. The dye will help to show where any narrowing or blockages are located in the arteries.  A tiny wire  will be guided to the blocked spot, and a balloon will be inflated to make the artery wider.  The stent will be expanded and will crush the plaques into the wall of the vessel. The stent will hold the area open and improve the blood flow. Most stents have a drug coating to reduce the risk of the stent narrowing over time.  The artery may be made wider using a drill, laser, or other tools to remove plaques.  When the blood flow is better, the catheter will be removed. The lining of the artery will grow over the stent, which stays where it was placed. This procedure may vary among health care providers and hospitals. What happens after the procedure?  If the procedure is done through the leg, you will be kept in bed lying flat for about 6 hours. You will be instructed to not bend and not cross your legs.  The insertion site will be checked frequently.  The pulse in your foot or wrist  will be checked frequently.  You may have additional blood tests, X-rays, and a test that records the electrical activity of your heart (electrocardiogram, or ECG). This information is not intended to replace advice given to you by your health care provider. Make sure you discuss any questions you have with your health care provider. Document Released: 03/19/2003 Document Revised: 12/22/2017 Document Reviewed: 04/17/2016 Elsevier Interactive Patient Education  2019 Elsevier Inc.    Metoprolol extended-release tablets What is this medicine? METOPROLOL (me TOE proe lole) is a beta-blocker. Beta-blockers reduce the workload on the heart and help it to beat more regularly. This medicine is used to treat high blood pressure and to prevent chest pain. It is also used to after a heart attack and to prevent an additional heart attack from occurring. This medicine may be used for other purposes; ask your health care provider or pharmacist if you have questions. COMMON BRAND NAME(S): toprol, Toprol XL What should I tell my health care provider before I take this medicine? They need to know if you have any of these conditions: -diabetes -heart or vessel disease like slow heart rate, worsening heart failure, heart block, sick sinus syndrome or Raynaud's disease -kidney disease -liver disease -lung or breathing disease, like asthma or emphysema -pheochromocytoma -thyroid disease -an unusual or allergic reaction to metoprolol, other beta-blockers, medicines, foods, dyes, or preservatives -pregnant or trying to get pregnant -breast-feeding How should I use this medicine? Take this medicine by mouth with a glass of water. Follow the directions on the prescription label. Do not crush or chew. Take this medicine with or immediately after meals. Take your doses at regular intervals. Do not take more medicine than directed. Do not stop taking this medicine suddenly. This could lead to serious heart-related  effects. Talk to your pediatrician regarding the use of this medicine in children. While this drug may be prescribed for children as young as 6 years for selected conditions, precautions do apply. Overdosage: If you think you have taken too much of this medicine contact a poison control center or emergency room at once. NOTE: This medicine is only for you. Do not share this medicine with others. What if I miss a dose? If you miss a dose, take it as soon as you can. If it is almost time for your next dose, take only that dose. Do not take double or extra doses. What may interact with this medicine? This medicine may interact with the following medications: -certain medicines for  blood pressure, heart disease, irregular heart beat -certain medicines for depression, like monoamine oxidase (MAO) inhibitors, fluoxetine, or paroxetine -clonidine -dobutamine -epinephrine -isoproterenol -reserpine This list may not describe all possible interactions. Give your health care provider a list of all the medicines, herbs, non-prescription drugs, or dietary supplements you use. Also tell them if you smoke, drink alcohol, or use illegal drugs. Some items may interact with your medicine. What should I watch for while using this medicine? Visit your doctor or health care professional for regular check ups. Contact your doctor right away if your symptoms worsen. Check your blood pressure and pulse rate regularly. Ask your health care professional what your blood pressure and pulse rate should be, and when you should contact them. You may get drowsy or dizzy. Do not drive, use machinery, or do anything that needs mental alertness until you know how this medicine affects you. Do not sit or stand up quickly, especially if you are an older patient. This reduces the risk of dizzy or fainting spells. Contact your doctor if these symptoms continue. Alcohol may interfere with the effect of this medicine. Avoid alcoholic  drinks. What side effects may I notice from receiving this medicine? Side effects that you should report to your doctor or health care professional as soon as possible: -allergic reactions like skin rash, itching or hives -cold or numb hands or feet -depression -difficulty breathing -faint -fever with sore throat -irregular heartbeat, chest pain -rapid weight gain -swollen legs or ankles Side effects that usually do not require medical attention (report to your doctor or health care professional if they continue or are bothersome): -anxiety or nervousness -change in sex drive or performance -dry skin -headache -nightmares or trouble sleeping -short term memory loss -stomach upset or diarrhea -unusually tired This list may not describe all possible side effects. Call your doctor for medical advice about side effects. You may report side effects to FDA at 1-800-FDA-1088. Where should I keep my medicine? Keep out of the reach of children. Store at room temperature between 15 and 30 degrees C (59 and 86 degrees F). Throw away any unused medicine after the expiration date. NOTE: This sheet is a summary. It may not cover all possible information. If you have questions about this medicine, talk to your doctor, pharmacist, or health care provider.  2019 Elsevier/Gold Standard (2013-05-17 14:41:37)

## 2018-12-06 ENCOUNTER — Telehealth: Payer: Self-pay | Admitting: *Deleted

## 2018-12-06 NOTE — Telephone Encounter (Signed)
Pt contacted pre-catheterization scheduled at [facility] for: Monday December 10, 2018 7:30 AM Verified arrival time and place: [place] Entrance A at: 5:30 AM  No solid food after midnight prior to cath, clear liquids until 5 AM day of procedure. Contrast allergy: no  Hold: Xarelto-last dose 12/07/18 until post procedure.  Except hold medications AM meds can be  taken pre-cath with sip of water including: ASA 81 mg   Confirmed patient has responsible person to drive home post procedure and observe 24 hours after arriving home: yes  I updated medication list with patient.

## 2018-12-10 ENCOUNTER — Inpatient Hospital Stay (HOSPITAL_COMMUNITY): Payer: PPO

## 2018-12-10 ENCOUNTER — Other Ambulatory Visit: Payer: Self-pay

## 2018-12-10 ENCOUNTER — Encounter (HOSPITAL_COMMUNITY)
Admission: RE | Disposition: A | Discharge status: Home / Self Care | Payer: Self-pay | Source: Home / Self Care | Attending: Cardiothoracic Surgery

## 2018-12-10 ENCOUNTER — Encounter (HOSPITAL_COMMUNITY): Payer: Self-pay | Admitting: Interventional Cardiology

## 2018-12-10 ENCOUNTER — Inpatient Hospital Stay (HOSPITAL_COMMUNITY)
Admission: RE | Admit: 2018-12-10 | Discharge: 2018-12-18 | DRG: 234 | Disposition: A | Discharge status: Home / Self Care | Payer: PPO | Attending: Cardiothoracic Surgery | Admitting: Cardiothoracic Surgery

## 2018-12-10 DIAGNOSIS — Z96653 Presence of artificial knee joint, bilateral: Secondary | ICD-10-CM | POA: Diagnosis not present

## 2018-12-10 DIAGNOSIS — K219 Gastro-esophageal reflux disease without esophagitis: Secondary | ICD-10-CM | POA: Diagnosis not present

## 2018-12-10 DIAGNOSIS — I471 Supraventricular tachycardia: Secondary | ICD-10-CM | POA: Diagnosis not present

## 2018-12-10 DIAGNOSIS — M109 Gout, unspecified: Secondary | ICD-10-CM | POA: Diagnosis not present

## 2018-12-10 DIAGNOSIS — Z832 Family history of diseases of the blood and blood-forming organs and certain disorders involving the immune mechanism: Secondary | ICD-10-CM

## 2018-12-10 DIAGNOSIS — Z09 Encounter for follow-up examination after completed treatment for conditions other than malignant neoplasm: Secondary | ICD-10-CM

## 2018-12-10 DIAGNOSIS — J9811 Atelectasis: Secondary | ICD-10-CM | POA: Diagnosis not present

## 2018-12-10 DIAGNOSIS — Z833 Family history of diabetes mellitus: Secondary | ICD-10-CM

## 2018-12-10 DIAGNOSIS — Z90710 Acquired absence of both cervix and uterus: Secondary | ICD-10-CM | POA: Diagnosis not present

## 2018-12-10 DIAGNOSIS — J90 Pleural effusion, not elsewhere classified: Secondary | ICD-10-CM | POA: Diagnosis not present

## 2018-12-10 DIAGNOSIS — Z7901 Long term (current) use of anticoagulants: Secondary | ICD-10-CM

## 2018-12-10 DIAGNOSIS — I129 Hypertensive chronic kidney disease with stage 1 through stage 4 chronic kidney disease, or unspecified chronic kidney disease: Secondary | ICD-10-CM | POA: Diagnosis present

## 2018-12-10 DIAGNOSIS — Z01810 Encounter for preprocedural cardiovascular examination: Secondary | ICD-10-CM

## 2018-12-10 DIAGNOSIS — R931 Abnormal findings on diagnostic imaging of heart and coronary circulation: Secondary | ICD-10-CM | POA: Diagnosis not present

## 2018-12-10 DIAGNOSIS — D62 Acute posthemorrhagic anemia: Secondary | ICD-10-CM | POA: Diagnosis not present

## 2018-12-10 DIAGNOSIS — E8770 Fluid overload, unspecified: Secondary | ICD-10-CM | POA: Diagnosis not present

## 2018-12-10 DIAGNOSIS — Z951 Presence of aortocoronary bypass graft: Secondary | ICD-10-CM

## 2018-12-10 DIAGNOSIS — Z8249 Family history of ischemic heart disease and other diseases of the circulatory system: Secondary | ICD-10-CM

## 2018-12-10 DIAGNOSIS — Z8673 Personal history of transient ischemic attack (TIA), and cerebral infarction without residual deficits: Secondary | ICD-10-CM | POA: Diagnosis not present

## 2018-12-10 DIAGNOSIS — I2510 Atherosclerotic heart disease of native coronary artery without angina pectoris: Secondary | ICD-10-CM | POA: Diagnosis present

## 2018-12-10 DIAGNOSIS — I480 Paroxysmal atrial fibrillation: Secondary | ICD-10-CM | POA: Diagnosis not present

## 2018-12-10 DIAGNOSIS — N183 Chronic kidney disease, stage 3 (moderate): Secondary | ICD-10-CM | POA: Diagnosis present

## 2018-12-10 DIAGNOSIS — Z79899 Other long term (current) drug therapy: Secondary | ICD-10-CM

## 2018-12-10 DIAGNOSIS — Z79890 Hormone replacement therapy (postmenopausal): Secondary | ICD-10-CM

## 2018-12-10 DIAGNOSIS — Z95828 Presence of other vascular implants and grafts: Secondary | ICD-10-CM

## 2018-12-10 DIAGNOSIS — E1122 Type 2 diabetes mellitus with diabetic chronic kidney disease: Secondary | ICD-10-CM | POA: Diagnosis not present

## 2018-12-10 DIAGNOSIS — I10 Essential (primary) hypertension: Secondary | ICD-10-CM | POA: Diagnosis not present

## 2018-12-10 DIAGNOSIS — F419 Anxiety disorder, unspecified: Secondary | ICD-10-CM | POA: Diagnosis present

## 2018-12-10 DIAGNOSIS — Z823 Family history of stroke: Secondary | ICD-10-CM

## 2018-12-10 DIAGNOSIS — M810 Age-related osteoporosis without current pathological fracture: Secondary | ICD-10-CM | POA: Diagnosis present

## 2018-12-10 DIAGNOSIS — J939 Pneumothorax, unspecified: Secondary | ICD-10-CM | POA: Diagnosis not present

## 2018-12-10 DIAGNOSIS — Z86718 Personal history of other venous thrombosis and embolism: Secondary | ICD-10-CM

## 2018-12-10 DIAGNOSIS — Z981 Arthrodesis status: Secondary | ICD-10-CM

## 2018-12-10 DIAGNOSIS — I200 Unstable angina: Secondary | ICD-10-CM | POA: Diagnosis not present

## 2018-12-10 DIAGNOSIS — Z9689 Presence of other specified functional implants: Secondary | ICD-10-CM

## 2018-12-10 DIAGNOSIS — I25110 Atherosclerotic heart disease of native coronary artery with unstable angina pectoris: Principal | ICD-10-CM | POA: Diagnosis present

## 2018-12-10 DIAGNOSIS — D6859 Other primary thrombophilia: Secondary | ICD-10-CM | POA: Diagnosis not present

## 2018-12-10 DIAGNOSIS — R9439 Abnormal result of other cardiovascular function study: Secondary | ICD-10-CM

## 2018-12-10 DIAGNOSIS — E039 Hypothyroidism, unspecified: Secondary | ICD-10-CM | POA: Diagnosis present

## 2018-12-10 DIAGNOSIS — E785 Hyperlipidemia, unspecified: Secondary | ICD-10-CM | POA: Diagnosis present

## 2018-12-10 DIAGNOSIS — Z86711 Personal history of pulmonary embolism: Secondary | ICD-10-CM

## 2018-12-10 DIAGNOSIS — Z452 Encounter for adjustment and management of vascular access device: Secondary | ICD-10-CM | POA: Diagnosis not present

## 2018-12-10 HISTORY — PX: LEFT HEART CATH AND CORONARY ANGIOGRAPHY: CATH118249

## 2018-12-10 HISTORY — DX: Atherosclerotic heart disease of native coronary artery without angina pectoris: I25.10

## 2018-12-10 LAB — URINALYSIS, ROUTINE W REFLEX MICROSCOPIC
Bacteria, UA: NONE SEEN
Bilirubin Urine: NEGATIVE
Glucose, UA: NEGATIVE mg/dL
Hgb urine dipstick: NEGATIVE
Ketones, ur: NEGATIVE mg/dL
Nitrite: NEGATIVE
Protein, ur: NEGATIVE mg/dL
Specific Gravity, Urine: 1.019 (ref 1.005–1.030)
pH: 5 (ref 5.0–8.0)

## 2018-12-10 LAB — COMPREHENSIVE METABOLIC PANEL
ALT: 11 U/L (ref 0–44)
AST: 19 U/L (ref 15–41)
Albumin: 3.4 g/dL — ABNORMAL LOW (ref 3.5–5.0)
Alkaline Phosphatase: 70 U/L (ref 38–126)
Anion gap: 8 (ref 5–15)
BUN: 14 mg/dL (ref 8–23)
CO2: 20 mmol/L — ABNORMAL LOW (ref 22–32)
Calcium: 8.8 mg/dL — ABNORMAL LOW (ref 8.9–10.3)
Chloride: 112 mmol/L — ABNORMAL HIGH (ref 98–111)
Creatinine, Ser: 0.79 mg/dL (ref 0.44–1.00)
GFR calc Af Amer: >60 mL/min (ref >60)
GFR calc non Af Amer: >60 mL/min (ref >60)
Glucose, Bld: 94 mg/dL (ref 70–99)
Potassium: 3.6 mmol/L (ref 3.5–5.1)
Sodium: 140 mmol/L (ref 135–145)
Total Bilirubin: 0.5 mg/dL (ref 0.3–1.2)
Total Protein: 6.3 g/dL — ABNORMAL LOW (ref 6.5–8.1)

## 2018-12-10 LAB — PULMONARY FUNCTION TEST
FEF 25-75 Post: 1.22 L/sec
FEF 25-75 Pre: 1.02 L/sec
FEF2575-%Change-Post: 19 %
FEF2575-%Pred-Post: 76 %
FEF2575-%Pred-Pre: 63 %
FEV1-%Change-Post: 4 %
FEV1-%Pred-Post: 64 %
FEV1-%Pred-Pre: 61 %
FEV1-Post: 1.29 L
FEV1-Pre: 1.23 L
FEV1FVC-%Change-Post: 11 %
FEV1FVC-%Pred-Pre: 100 %
FEV6-%Change-Post: -3 %
FEV6-%Pred-Post: 60 %
FEV6-%Pred-Pre: 62 %
FEV6-Post: 1.55 L
FEV6-Pre: 1.6 L
FEV6FVC-%Change-Post: 1 %
FEV6FVC-%Pred-Post: 105 %
FEV6FVC-%Pred-Pre: 103 %
FVC-%Change-Post: -5 %
FVC-%Pred-Post: 57 %
FVC-%Pred-Pre: 61 %
FVC-Post: 1.55 L
FVC-Pre: 1.64 L
Post FEV1/FVC ratio: 83 %
Post FEV6/FVC ratio: 100 %
Pre FEV1/FVC ratio: 75 %
Pre FEV6/FVC Ratio: 98 %

## 2018-12-10 LAB — BLOOD GAS, ARTERIAL
Acid-base deficit: 2.2 mmol/L — ABNORMAL HIGH (ref 0.0–2.0)
Bicarbonate: 21.4 mmol/L (ref 20.0–28.0)
Drawn by: 27011
O2 Content: 2 L/min
O2 Saturation: 96.6 %
Patient temperature: 98.6
pCO2 arterial: 33 mmHg (ref 32.0–48.0)
pH, Arterial: 7.429 (ref 7.350–7.450)
pO2, Arterial: 83.1 mmHg (ref 83.0–108.0)

## 2018-12-10 LAB — PROTIME-INR
INR: 1 (ref 0.8–1.2)
INR: 1 (ref 0.8–1.2)
Prothrombin Time: 12.7 seconds (ref 11.4–15.2)
Prothrombin Time: 13.3 seconds (ref 11.4–15.2)

## 2018-12-10 LAB — HEMOGLOBIN A1C
Hgb A1c MFr Bld: 5.5 % (ref 4.8–5.6)
Mean Plasma Glucose: 111.15 mg/dL

## 2018-12-10 LAB — APTT: aPTT: 48 seconds — ABNORMAL HIGH (ref 24–36)

## 2018-12-10 LAB — SURGICAL PCR SCREEN
MRSA, PCR: NEGATIVE
Staphylococcus aureus: NEGATIVE

## 2018-12-10 SURGERY — LEFT HEART CATH AND CORONARY ANGIOGRAPHY
Anesthesia: LOCAL

## 2018-12-10 MED ORDER — SODIUM CHLORIDE 0.9 % IV SOLN
1.5000 g | INTRAVENOUS | Status: AC
  Administered 2018-12-11: 1.5 g via INTRAVENOUS
  Filled 2018-12-10: qty 1.5

## 2018-12-10 MED ORDER — SODIUM CHLORIDE 0.9% FLUSH
3.0000 mL | Freq: Two times a day (BID) | INTRAVENOUS | Status: DC
  Administered 2018-12-10: 3 mL via INTRAVENOUS

## 2018-12-10 MED ORDER — VERAPAMIL HCL 2.5 MG/ML IV SOLN
INTRAVENOUS | Status: AC
  Filled 2018-12-10: qty 2

## 2018-12-10 MED ORDER — LINACLOTIDE 145 MCG PO CAPS
145.0000 ug | ORAL_CAPSULE | Freq: Every day | ORAL | Status: DC
  Administered 2018-12-12 – 2018-12-18 (×4): 145 ug via ORAL
  Filled 2018-12-10 (×8): qty 1

## 2018-12-10 MED ORDER — LIDOCAINE HCL (PF) 1 % IJ SOLN
INTRAMUSCULAR | Status: DC | PRN
  Administered 2018-12-10: 2 mL

## 2018-12-10 MED ORDER — NOREPINEPHRINE 4 MG/250ML-% IV SOLN
0.0000 ug/min | INTRAVENOUS | Status: DC
  Filled 2018-12-10: qty 250

## 2018-12-10 MED ORDER — MIDAZOLAM HCL 2 MG/2ML IJ SOLN
INTRAMUSCULAR | Status: AC
  Filled 2018-12-10: qty 2

## 2018-12-10 MED ORDER — MIRTAZAPINE 7.5 MG PO TABS
30.0000 mg | ORAL_TABLET | Freq: Every day | ORAL | Status: DC
  Administered 2018-12-10: 30 mg via ORAL
  Filled 2018-12-10: qty 4

## 2018-12-10 MED ORDER — ACETAMINOPHEN 325 MG PO TABS
650.0000 mg | ORAL_TABLET | ORAL | Status: DC | PRN
  Administered 2018-12-10: 650 mg via ORAL
  Filled 2018-12-10: qty 2

## 2018-12-10 MED ORDER — HEPARIN (PORCINE) IN NACL 1000-0.9 UT/500ML-% IV SOLN
INTRAVENOUS | Status: DC | PRN
  Administered 2018-12-10 (×2): 500 mL

## 2018-12-10 MED ORDER — ACETAMINOPHEN 500 MG PO TABS: 500.0000 mg | ORAL_TABLET | Freq: Four times a day (QID) | ORAL | Status: DC | PRN

## 2018-12-10 MED ORDER — POTASSIUM CHLORIDE 2 MEQ/ML IV SOLN
80.0000 meq | INTRAVENOUS | Status: DC
  Filled 2018-12-10: qty 40

## 2018-12-10 MED ORDER — PRAVASTATIN SODIUM 40 MG PO TABS
40.0000 mg | ORAL_TABLET | Freq: Every day | ORAL | Status: DC
  Administered 2018-12-10 – 2018-12-14 (×5): 40 mg via ORAL
  Filled 2018-12-10 (×5): qty 1

## 2018-12-10 MED ORDER — HEPARIN (PORCINE) 25000 UT/250ML-% IV SOLN
950.0000 [IU]/h | INTRAVENOUS | Status: DC
  Administered 2018-12-10 (×3): 950 [IU]/h via INTRAVENOUS
  Filled 2018-12-10 (×2): qty 250

## 2018-12-10 MED ORDER — SODIUM CHLORIDE 0.9 % IV SOLN
750.0000 mg | INTRAVENOUS | Status: DC
  Filled 2018-12-10: qty 750

## 2018-12-10 MED ORDER — CHLORHEXIDINE GLUCONATE CLOTH 2 % EX PADS
6.0000 | MEDICATED_PAD | Freq: Once | CUTANEOUS | Status: AC
  Administered 2018-12-10: 6 via TOPICAL

## 2018-12-10 MED ORDER — HEPARIN SODIUM (PORCINE) 1000 UNIT/ML IJ SOLN
INTRAMUSCULAR | Status: DC | PRN
  Administered 2018-12-10: 3500 [IU] via INTRAVENOUS

## 2018-12-10 MED ORDER — ONDANSETRON HCL 4 MG/2ML IJ SOLN: 4.0000 mg | Freq: Four times a day (QID) | INTRAMUSCULAR | Status: DC | PRN

## 2018-12-10 MED ORDER — MAGNESIUM SULFATE 50 % IJ SOLN
40.0000 meq | INTRAMUSCULAR | Status: DC
  Filled 2018-12-10 (×2): qty 9.85

## 2018-12-10 MED ORDER — FENTANYL CITRATE (PF) 100 MCG/2ML IJ SOLN
INTRAMUSCULAR | Status: DC | PRN
  Administered 2018-12-10 (×2): 25 ug via INTRAVENOUS

## 2018-12-10 MED ORDER — EPINEPHRINE PF 1 MG/ML IJ SOLN
0.0000 ug/min | INTRAVENOUS | Status: DC
  Filled 2018-12-10: qty 4

## 2018-12-10 MED ORDER — CLOPIDOGREL BISULFATE 75 MG PO TABS
75.0000 mg | ORAL_TABLET | ORAL | Status: AC
  Administered 2018-12-10: 75 mg via ORAL
  Filled 2018-12-10: qty 1

## 2018-12-10 MED ORDER — NITROGLYCERIN 0.4 MG SL SUBL: 0.4000 mg | SUBLINGUAL_TABLET | SUBLINGUAL | Status: DC | PRN

## 2018-12-10 MED ORDER — PLASMA-LYTE 148 IV SOLN
INTRAVENOUS | Status: AC
  Administered 2018-12-11: 500 mL
  Filled 2018-12-10: qty 2.5

## 2018-12-10 MED ORDER — HEPARIN SODIUM (PORCINE) 1000 UNIT/ML IJ SOLN
INTRAMUSCULAR | Status: AC
  Filled 2018-12-10: qty 1

## 2018-12-10 MED ORDER — INSULIN REGULAR(HUMAN) IN NACL 100-0.9 UT/100ML-% IV SOLN
INTRAVENOUS | Status: AC
  Administered 2018-12-11: .8 [IU]/h via INTRAVENOUS
  Filled 2018-12-10: qty 100

## 2018-12-10 MED ORDER — SODIUM CHLORIDE 0.9 % WEIGHT BASED INFUSION
3.0000 mL/kg/h | INTRAVENOUS | Status: DC
  Administered 2018-12-10: 3 mL/kg/h via INTRAVENOUS

## 2018-12-10 MED ORDER — TRANEXAMIC ACID 1000 MG/10ML IV SOLN
1.5000 mg/kg/h | INTRAVENOUS | Status: AC
  Administered 2018-12-11: 1.5 mg/kg/h via INTRAVENOUS
  Filled 2018-12-10: qty 25

## 2018-12-10 MED ORDER — CHLORHEXIDINE GLUCONATE CLOTH 2 % EX PADS: 6.0000 | MEDICATED_PAD | Freq: Once | CUTANEOUS | Status: DC

## 2018-12-10 MED ORDER — SODIUM CHLORIDE 0.9% FLUSH: 3.0000 mL | INTRAVENOUS | Status: DC | PRN

## 2018-12-10 MED ORDER — CITALOPRAM HYDROBROMIDE 20 MG PO TABS
10.0000 mg | ORAL_TABLET | Freq: Every day | ORAL | Status: DC
  Administered 2018-12-10 – 2018-12-11 (×2): 10 mg via ORAL
  Filled 2018-12-10 (×2): qty 1

## 2018-12-10 MED ORDER — VANCOMYCIN HCL 10 G IV SOLR
1250.0000 mg | INTRAVENOUS | Status: AC
  Administered 2018-12-11: 1250 mg via INTRAVENOUS
  Filled 2018-12-10: qty 1250

## 2018-12-10 MED ORDER — CITALOPRAM HYDROBROMIDE 20 MG PO TABS
40.0000 mg | ORAL_TABLET | Freq: Every day | ORAL | Status: DC
  Administered 2018-12-10 – 2018-12-12 (×2): 40 mg via ORAL
  Filled 2018-12-10: qty 1
  Filled 2018-12-10: qty 2

## 2018-12-10 MED ORDER — ASPIRIN 81 MG PO CHEW
81.0000 mg | CHEWABLE_TABLET | Freq: Every day | ORAL | Status: DC
  Filled 2018-12-10: qty 1

## 2018-12-10 MED ORDER — MUPIROCIN 2 % EX OINT: 1.0000 "application " | TOPICAL_OINTMENT | Freq: Two times a day (BID) | CUTANEOUS | Status: DC

## 2018-12-10 MED ORDER — METOPROLOL SUCCINATE ER 25 MG PO TB24
25.0000 mg | ORAL_TABLET | Freq: Every day | ORAL | Status: DC
  Administered 2018-12-10: 25 mg via ORAL
  Filled 2018-12-10: qty 1

## 2018-12-10 MED ORDER — OXYCODONE HCL 5 MG PO TABS
10.0000 mg | ORAL_TABLET | Freq: Four times a day (QID) | ORAL | Status: DC | PRN
  Administered 2018-12-10: 10 mg via ORAL
  Filled 2018-12-10 (×3): qty 2

## 2018-12-10 MED ORDER — PANTOPRAZOLE SODIUM 40 MG PO TBEC
40.0000 mg | DELAYED_RELEASE_TABLET | Freq: Every day | ORAL | Status: DC
  Administered 2018-12-10: 40 mg via ORAL
  Filled 2018-12-10 (×2): qty 1

## 2018-12-10 MED ORDER — SODIUM CHLORIDE 0.9 % IV SOLN
250.0000 mL | INTRAVENOUS | Status: DC | PRN
  Administered 2018-12-10: 10 mL via INTRAVENOUS

## 2018-12-10 MED ORDER — MILRINONE LACTATE IN DEXTROSE 20-5 MG/100ML-% IV SOLN
0.3000 ug/kg/min | INTRAVENOUS | Status: DC
  Filled 2018-12-10: qty 100

## 2018-12-10 MED ORDER — LIDOCAINE HCL (PF) 1 % IJ SOLN
INTRAMUSCULAR | Status: AC
  Filled 2018-12-10: qty 30

## 2018-12-10 MED ORDER — PHENYLEPHRINE HCL-NACL 20-0.9 MG/250ML-% IV SOLN
30.0000 ug/min | INTRAVENOUS | Status: DC
  Filled 2018-12-10: qty 250

## 2018-12-10 MED ORDER — TEMAZEPAM 7.5 MG PO CAPS
15.0000 mg | ORAL_CAPSULE | Freq: Once | ORAL | Status: AC | PRN
  Administered 2018-12-10: 15 mg via ORAL
  Filled 2018-12-10: qty 2

## 2018-12-10 MED ORDER — SODIUM CHLORIDE 0.9 % IV SOLN: INTRAVENOUS | Status: AC

## 2018-12-10 MED ORDER — CHLORHEXIDINE GLUCONATE 0.12 % MT SOLN
15.0000 mL | Freq: Once | OROMUCOSAL | Status: AC
  Administered 2018-12-11: 15 mL via OROMUCOSAL
  Filled 2018-12-10: qty 15

## 2018-12-10 MED ORDER — TRANEXAMIC ACID (OHS) BOLUS VIA INFUSION
15.0000 mg/kg | INTRAVENOUS | Status: AC
  Administered 2018-12-11: 1089 mg via INTRAVENOUS
  Filled 2018-12-10: qty 1089

## 2018-12-10 MED ORDER — SODIUM CHLORIDE 0.9 % IV SOLN: 250.0000 mL | INTRAVENOUS | Status: DC | PRN

## 2018-12-10 MED ORDER — DOPAMINE-DEXTROSE 3.2-5 MG/ML-% IV SOLN
0.0000 ug/kg/min | INTRAVENOUS | Status: DC
  Filled 2018-12-10: qty 250

## 2018-12-10 MED ORDER — AMLODIPINE BESYLATE 5 MG PO TABS
5.0000 mg | ORAL_TABLET | Freq: Every day | ORAL | Status: DC
  Administered 2018-12-10: 5 mg via ORAL
  Filled 2018-12-10: qty 1

## 2018-12-10 MED ORDER — SODIUM CHLORIDE 0.9 % WEIGHT BASED INFUSION: 1.0000 mL/kg/h | INTRAVENOUS | Status: DC

## 2018-12-10 MED ORDER — SODIUM CHLORIDE 0.9 % IV SOLN
INTRAVENOUS | Status: DC
  Filled 2018-12-10: qty 30

## 2018-12-10 MED ORDER — BISACODYL 5 MG PO TBEC: 5.0000 mg | DELAYED_RELEASE_TABLET | Freq: Once | ORAL | Status: DC

## 2018-12-10 MED ORDER — CLONIDINE HCL 0.1 MG PO TABS
0.1000 mg | ORAL_TABLET | Freq: Every day | ORAL | Status: DC
  Administered 2018-12-10: 0.1 mg via ORAL
  Filled 2018-12-10: qty 1

## 2018-12-10 MED ORDER — METOPROLOL TARTRATE 12.5 MG HALF TABLET: 12.5000 mg | ORAL_TABLET | Freq: Once | ORAL | Status: DC

## 2018-12-10 MED ORDER — DEXMEDETOMIDINE HCL IN NACL 400 MCG/100ML IV SOLN
0.1000 ug/kg/h | INTRAVENOUS | Status: AC
  Administered 2018-12-11: .5 ug/kg/h via INTRAVENOUS
  Filled 2018-12-10: qty 100

## 2018-12-10 MED ORDER — TRANEXAMIC ACID (OHS) PUMP PRIME SOLUTION
2.0000 mg/kg | INTRAVENOUS | Status: DC
  Filled 2018-12-10: qty 1.45

## 2018-12-10 MED ORDER — VERAPAMIL HCL 2.5 MG/ML IV SOLN
INTRAVENOUS | Status: DC | PRN
  Administered 2018-12-10: 10 mL via INTRA_ARTERIAL

## 2018-12-10 MED ORDER — LEVOTHYROXINE SODIUM 25 MCG PO TABS
125.0000 ug | ORAL_TABLET | Freq: Every day | ORAL | Status: DC
  Administered 2018-12-10 – 2018-12-17 (×8): 125 ug via ORAL
  Filled 2018-12-10 (×8): qty 1

## 2018-12-10 MED ORDER — FENTANYL CITRATE (PF) 100 MCG/2ML IJ SOLN
INTRAMUSCULAR | Status: AC
  Filled 2018-12-10: qty 2

## 2018-12-10 MED ORDER — ASPIRIN 81 MG PO CHEW: 81.0000 mg | CHEWABLE_TABLET | Freq: Once | ORAL | Status: DC

## 2018-12-10 MED ORDER — NITROGLYCERIN IN D5W 200-5 MCG/ML-% IV SOLN
2.0000 ug/min | INTRAVENOUS | Status: DC
  Filled 2018-12-10: qty 250

## 2018-12-10 MED ORDER — ALBUTEROL SULFATE (2.5 MG/3ML) 0.083% IN NEBU
2.5000 mg | INHALATION_SOLUTION | Freq: Once | RESPIRATORY_TRACT | Status: AC
  Administered 2018-12-10: 2.5 mg via RESPIRATORY_TRACT

## 2018-12-10 MED ORDER — MIDAZOLAM HCL 2 MG/2ML IJ SOLN
INTRAMUSCULAR | Status: DC | PRN
  Administered 2018-12-10: 1 mg via INTRAVENOUS
  Administered 2018-12-10: 2 mg via INTRAVENOUS

## 2018-12-10 MED ORDER — HEPARIN (PORCINE) IN NACL 1000-0.9 UT/500ML-% IV SOLN
INTRAVENOUS | Status: AC
  Filled 2018-12-10: qty 1000

## 2018-12-10 SURGICAL SUPPLY — 12 items
CATH 5FR JL3.5 JR4 ANG PIG MP (CATHETERS) ×2 IMPLANT
CATH LAUNCHER 5F EBU3.0 (CATHETERS) ×1 IMPLANT
CATHETER LAUNCHER 5F EBU3.0 (CATHETERS) ×2
DEVICE RAD COMP TR BAND LRG (VASCULAR PRODUCTS) ×2 IMPLANT
GLIDESHEATH SLEND SS 6F .021 (SHEATH) ×2 IMPLANT
GUIDEWIRE INQWIRE 1.5J.035X260 (WIRE) ×1 IMPLANT
INQWIRE 1.5J .035X260CM (WIRE) ×2
KIT HEART LEFT (KITS) ×2 IMPLANT
PACK CARDIAC CATHETERIZATION (CUSTOM PROCEDURE TRAY) ×2 IMPLANT
SHEATH PROBE COVER 6X72 (BAG) ×2 IMPLANT
TRANSDUCER W/STOPCOCK (MISCELLANEOUS) ×2 IMPLANT
TUBING CIL FLEX 10 FLL-RA (TUBING) ×2 IMPLANT

## 2018-12-10 NOTE — Progress Notes (Signed)
p 

## 2018-12-10 NOTE — Progress Notes (Signed)
[facility] called to see how many units will be needed for surgery tomorrow.  Caller wanted for [place] to call Dr. Driscilla Moats.  Called Thoracic surgeon to ask how many units of blood would be needed. Per MD, stated 2 units.  Called [facility] and let them know about the 2 units.

## 2018-12-10 NOTE — Consult Note (Addendum)
Tina Dominguez       [place] [address]             [id number]        Tina Dominguez [facility] Medical Record #[id number] Date of Birth: Apr 22, 1941  Referring: Dr. Irish Lack Primary Care: Raelyn Number, MD Primary Cardiologist: Shirlee More, M.D.   Chief Complaint:  Chest pain.   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  She is on chronic anticoagulation with rivaroxaban.  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  She eventually underwent right carotid artery endarterectomy in 2012.  She has had no further neurologic events since that procedure but reports recurrent stenosis of "around 60%" based on her last carotid ultrasound..  She also has a history of stage III chronic kidney disease.  She had an episode of prolonged angina about a month ago that lasted about 6 hours.  She was encouraged by her family to go to emergency room but declined.  She eventually sought medical attention in the outpatient setting by Dr. Shirlee More.  He recommended a nuclear stress test.  This was performed on 12/04/2018 and demonstrated a moderate to severe ischemic defect in the basal inferior, mid inferior, and apical inferior left ventricle. After review of study, Dr. Bettina Gavia recommended proceeding with left heart catheterization and the study was performed electively earlier today.  Coronary angiography revealed a proximal left main stenosis of 60%.  There was 90% proximal LAD stenosis and a 99% stenosis of the mid right coronary artery.  Circumflex system was spared of any significant obstructive disease.  Ejection fraction was estimated at 55 to 65%.  We have been asked to evaluate Tina Dominguez for consideration of  surgical coronary revascularization. As mentioned above, Tina Dominguez takes  rivaroxaban chronically for history of venous thromboses and history of multiple pulmonary emboli.  Her last dose was 12/07/2018.  She also received a single dose of Plavix 75 mg this morning prior to the left heart catheterization.  A heparin infusion has been ordered post-cath. Currently, Tina Dominguez is resting in bed in the Cath Lab recovery area.  Her son is at the bedside.  He denies having any chest pain presently nor has she had any similar to the 6-hour episode that occurred a month ago.  She was prescribed nitroglycerin by Dr. Bettina Gavia few weeks ago but has not used any.  Current Activity/ Functional Status:    Zubrod Score: At the time of surgery this patients most appropriate activity status/level should be described as: []     0    Normal activity, no symptoms [x]     1    Restricted in physical strenuous activity but ambulatory, able to do out light work []     2    Ambulatory and capable of self care, unable to do work activities, up and about                 more than 50%  Of the time                            []     3    Only limited self care, in bed greater than 50% of waking hours []     4  Completely disabled, no self care, confined to bed or chair []     5    Moribund  Past Medical History:  Diagnosis Date   Aftercare following surgery of the circulatory system, NEC 10/14/2013   Anemia 12/13/2017   Anxiety 12/13/2017   Arthritis    Carotid artery occlusion    Clotting disorder ([place])    Depression    Dysphagia 04/17/2017   GERD (gastroesophageal reflux disease) 05/26/2011   GI bleeding 10/04/2017   Gout 12/13/2017   Hiatal hernia 12/13/2017   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    History of recurrent TIAs 12/13/2017   History of right-sided carotid endarterectomy 10/10/2011   Hyperlipidemia    Hypertension    Leg pain    with walking   Occlusion and  stenosis of carotid artery without mention of cerebral infarction 10/08/2012   Osteoarthritis 05/26/2011   Osteoporosis    PE (pulmonary embolism) 05/26/2011   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[place]) 04/17/2017   Reflux    Renal failure 12/13/2017   S/P IVC filter 12/13/2017   Schatzki's ring 12/13/2017   Stroke ([place])    X's 4   Valvular heart disease 12/13/2017    Past Surgical History:  Procedure Laterality Date   ABDOMINAL HYSTERECTOMY  1980   TAH    APPENDECTOMY     BACK SURGERY  06/2011   BREAST SURGERY     x2   CAROTID ENDARTERECTOMY  11/11/2010   Right CEA   IVC FILTER INSERTION     JOINT REPLACEMENT Right 2005   Right Total Knee   JOINT REPLACEMENT Left 2005   Left Total Knee   SHOULDER SURGERY     right   SPINE SURGERY  07-11-11   Decomp. Laminectomy, fusion of L4-5,L5-S1 by Dr. Saintclair Halsted   TOTAL KNEE ARTHROPLASTY     right  and left    Social History   Tobacco Use  Smoking Status Never Smoker  Smokeless Tobacco Never Used    Social History   Substance and Sexual Activity  Alcohol Use No     No Known Allergies  Current Facility-Administered Medications  Medication Dose Route Frequency Provider Last Rate Last Dose   0.9 %  sodium chloride infusion  250 mL Intravenous PRN Richardo Priest, MD       0.9 %  sodium chloride infusion   Intravenous Continuous Jettie Booze, MD 75 mL/hr at 12/10/18 0856 75 mL/hr at 12/10/18 0856   0.9% sodium chloride infusion  1 mL/kg/hr Intravenous Continuous Richardo Priest, MD 72.8 mL/hr at 12/10/18 0711 1 mL/kg/hr at 12/10/18 0711   acetaminophen (TYLENOL) tablet 500-1,000 mg  500-1,000 mg Oral Q6H PRN Jettie Booze, MD       amLODipine (NORVASC) tablet 5 mg  5 mg Oral Daily Jettie Booze, MD       aspirin chewable tablet 81 mg  81 mg Oral Once Jettie Booze, MD       citalopram (CELEXA) tablet 40 mg  40 mg Oral Daily Jettie Booze, MD       heparin ADULT infusion 100  units/mL (25000 units/251mL sodium chloride 0.45%)  950 Units/hr Intravenous Continuous Einar Grad, RPH       metoprolol succinate (TOPROL-XL) 24 hr tablet 25 mg  25 mg Oral Daily Jettie Booze, MD       nitroGLYCERIN (NITROSTAT) SL  tablet 0.4 mg  0.4 mg Sublingual Q5 min PRN Jettie Booze, MD       ondansetron [facility]) injection 4 mg  4 mg Intravenous Q6H PRN Jettie Booze, MD       Oxycodone HCl TABS 10 mg  10 mg Oral Q6H PRN Jettie Booze, MD       pantoprazole (PROTONIX) EC tablet 40 mg  40 mg Oral Daily Jettie Booze, MD       sodium chloride flush (NS) 0.9 % injection 3 mL  3 mL Intravenous Q12H Munley, Hilton Cork, MD       sodium chloride flush (NS) 0.9 % injection 3 mL  3 mL Intravenous PRN Richardo Priest, MD        Medications Prior to Admission  Medication Sig Dispense Refill Last Dose   acetaminophen (TYLENOL) 500 MG tablet Take 500-1,000 mg by mouth every 6 (six) hours as needed for moderate pain.    prn   amLODipine (NORVASC) 5 MG tablet Take 1 tablet (5 mg total) by mouth daily. (Patient taking differently: Take 5 mg by mouth every evening. ) 90 tablet 3 12/09/2018   citalopram (CELEXA) 10 MG tablet Take 10 mg by mouth at bedtime. Take with 40 mg to equal 50 mg at bedtime   12/09/2018   citalopram (CELEXA) 40 MG tablet Take 40 mg by mouth daily. Take with 10 mg to equal 50 mg at bedtime   12/09/2018   cloNIDine (CATAPRES) 0.1 MG tablet Take 1 tablet (0.1 mg total) by mouth at bedtime. 90 tablet 2 12/09/2018   levothyroxine (SYNTHROID, LEVOTHROID) 125 MCG tablet Take 125 mcg by mouth at bedtime.   0 12/09/2018   linaclotide (LINZESS) 145 MCG CAPS capsule Take 145 mcg by mouth daily before breakfast.   12/09/2018   metoprolol succinate (TOPROL-XL) 25 MG 24 hr tablet TAKE 1 TABLET(25 MG) BY MOUTH DAILY (Patient taking differently: Take 25 mg by mouth daily. ) 90 tablet 0 12/09/2018   mirtazapine (REMERON) 30 MG tablet Take 30 mg by  mouth at bedtime.    12/09/2018   Multiple Vitamin (MULTIVITAMIN) tablet Take 1 tablet by mouth at bedtime.    12/09/2018   nitroGLYCERIN (NITROSTAT) 0.4 MG SL tablet Place 1 tablet (0.4 mg total) under the tongue every 5 (five) minutes as needed for chest pain. 25 tablet 11 none   Oxycodone HCl 10 MG TABS Take 10 mg by mouth every 6 (six) hours as needed (pain).    12/10/2018 at 0100   pantoprazole (PROTONIX) 40 MG tablet TAKE 1 TABLET(40 MG) BY MOUTH TWICE DAILY 180 tablet 0 12/09/2018   pravastatin (PRAVACHOL) 40 MG tablet Take 40 mg by mouth at bedtime.    12/09/2018   XARELTO 20 MG TABS tablet Take 20 mg by mouth daily.  0 12/07/2018 at am    Family History  Problem Relation Age of Onset   Clotting disorder Mother    Deep vein thrombosis Mother    Diabetes Mother    Heart disease Mother    Hyperlipidemia Mother    Hypertension Mother    Heart disease Father 38   Varicose Veins Father    Stroke Maternal Grandmother    Deep vein thrombosis Sister    Diabetes Sister    Heart disease Brother      Review of Systems:   ROS Constitutional: negative Eyes: negative, mild bluring of her vision lately Respiratory: positive for exertional shortness of breath Cardiovascular: positive for Chest  pain as described i the HPI. Gastrointestinal: positive for History of hiatal hernia and multiple bleeding ulcers 9-12 months ago. 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  Musculoskeletal: Degenerative joint disease, status post bilateral knee replacements.  She has arthritis in her hands. Neurological: Reports full recovery from her logic deficits related to the strokes.  Her only residual deficit is numbness in the fingers of her left hand. Endocrine: negative Allergic/Immunologic: negative                      Physical Exam: BP (!) 180/57    Pulse (!) 57    Temp (!) 97.4 F (36.3 C) (Oral)    Resp 17    Ht 5\' 3"  (1.6 m)    Wt 72.6 kg    SpO2 98%    BMI 28.34 kg/m    General appearance: alert, cooperative, appears stated age and no distress Head: Normocephalic, without obvious abnormality, atraumatic Neck: no adenopathy, no carotid bruit, no JVD, supple, symmetrical, trachea midline, thyroid not enlarged, symmetric, no tenderness/mass/nodules and There is a well-healed scar on the right side the neck consistent with the reported endarterectomy. Lymph nodes: Cervical, supraclavicular, and axillary nodes normal. Resp: clear to auscultation bilaterally Cardio: Irregular rhythm.  Monitor shows sinus rhythm with frequent PACs.  He has normal S1-S2. GI: soft, non-tender; bowel sounds normal; no masses,  no organomegaly Extremities: varicose veins noted and All extremities are well perfused.  There are palpable dorsalis pedis and posterior pulses bilaterally.  She is status post bilateral knee replacements.  There are multiple superficial varicosities of both lower extremities. Neurologic: Alert and oriented X 3, normal strength and tone. Normal symmetric reflexes. Normal coordination and gait  Diagnostic Studies & Laboratory data:  Result Notes for MYOCARDIAL PERFUSION IMAGING   Notes recorded by Stevan Born, CMA on 12/04/2018 at 5:08 PM EDT Patient informed of results.  ------  Notes recorded by Richardo Priest, MD on 12/04/2018 at 1:18 PM EDT   Abnormal result, see me next few weeks if not scheduled to review      Vitals   Height Weight BMI (Calculated)  5\' 3"  (1.6 m) 72.6 kg 28.35  Study Highlights    Nuclear stress EF: 58%.  There was no ST segment deviation noted during stress.  Defect 1: There is a medium defect of moderate severity present in the basal inferior, mid inferior and apical inferior location.  Findings consistent with ischemia.  This is an intermediate  risk study.  The left ventricular ejection fraction is normal (55-65%).  Significant breast attenuation suggested on this study. This makes that study fair quality.    Nuclear History and Indications   History and Indications Indication for Stress Test: Diagnosis of coronary disease History: No known CAD Cardiac Risk Factors: CVA, Family History - CAD, Hypertension and Lipids  Symptoms: Chest Pain, Palpitations and SOB  Stress Findings   ECG Baseline ECG exhibits normal sinus rhythm..  Stress Findings A pharmacological stress test was performed using IV Lexiscan 0.4mg  over 10 seconds performed without concurrent submaximal exercise.  The patient reported chest discomfort, nausea, shortness of breath and headache during the stress test.   Test was stopped per protocol.   Recovery time: 5 minutes.  Response to Stress There was no ST segment deviation  noted during stress.  Arrhythmias during stress: none.  Arrhythmias during recovery: none.  There were no significant arrhythmias noted during the test.  ECG was interpretable and there was no significant change from baseline.  Stress Measurements   Baseline Vitals  Rest HR 67 bpm    Rest BP 188/84 mmHg    Peak Stress Vitals  Peak HR 83 bpm    Peak BP 175/74 mmHg       Nuclear Stress Measurements   LV sys vol 31 mL    TID 1.08     LV dias vol 73 mL    SSS 8     SRS 5     SDS 3          Nuclear Stress Findings   Isotope administration Rest isotope was administered with an IV injection of 10.8 mCi Tc70m Tetrofosmin. Rest SPECT images were obtained approximately 45 minutes post tracer injection. Stress isotope was administered with an IV injection of 29.4 mCi Tc63m Tetrofosmin Stress SPECT images were obtained approximately 60 minutes post tracer injection.  Nuclear Study Quality Overall image quality is good.  Nuclear Measurements Study was gated.  Rest Perfusion Rest perfusion abnormal. There is a defect present in  the basal anteroseptal, mid anteroseptal and apical septal location.  Stress Perfusion Stress perfusion abnormal. There is a defect present in the basal anteroseptal, basal inferior, mid anteroseptal, mid inferior and apical septal location.  Perfusion Summary Defect 1:  There is a medium defect of moderate severity present in the basal inferior, mid inferior and apical inferior location. The defect is reversible.  Overall Study Impression Myocardial perfusion is abnormal. Findings consistent with ischemia. This is an intermediate risk study. Overall left ventricular systolic function was normal. LV cavity size is normal. Nuclear stress EF: 58%. The left ventricular ejection fraction is normal (55-65%). There is no prior study for comparison.  From: ACCF/SCAI/STS/AATS/AHA/ASNC/HFSA/SCCT 2012 Appropriate Use Criteria for Coronary Revascularization Focused Update  Wall Scoring   Score Index: 1.000 Percent Normal: 100.0%           The following segments are normal: basal anterior, basal anteroseptal, basal inferoseptal, basal inferior, basal inferolateral and basal anterolateral. Other segments could not be evaluated.          Resulted by:   Signed Date/Time  Phone Pager  Jenean Lindau 12/04/2018 12:34 PM [phone]   Report approved and finalized on 12/04/2018 1234     Procedures   LEFT HEART CATH AND CORONARY ANGIOGRAPHY  Conclusion     Ost LM to Mid LM lesion is 60% stenosed.  Ost RCA to Prox RCA lesion is 80% stenosed.  Mid RCA lesion is 99% stenosed.  Prox LAD lesion is 90% stenosed.  There is no aortic valve stenosis.  The left ventricular systolic function is normal.  LV end diastolic pressure is normal.  The left ventricular ejection fraction is 55-65% by visual estimate.  Tortuosity in the right subclavian made catheter engagement difficult.  Would not use right radial approach.   Severe multivessel, calcific CAD.  Patient with rest pain at home.  Only  received 1 dose of PLavix, 75 mg this AM since she was off of her Xarelto.  THis is not a chronic medicine for her so she would not have therapeutic antiplatelet inhibition.   Start IV heparin.  Cardiac surgery consult.    Indications   Abnormal nuclear stress test [R94.39 (ICD-10-CM)]  Procedural Details   Technical Details The risks, benefits, and details of  the procedure were explained to the patient. The patient verbalized understanding and wanted to proceed. Informed written consent was obtained.  PROCEDURE TECHNIQUE: After Xylocaine anesthesia a 63F slender sheath was placed in the right radial artery with a single anterior needle wall stick. IV Heparin was given. Right coronary angiography was done using a Judkins R4 guide catheter. Left coronary angiography was done using a Judkins L3.5 and EBU 3 guide catheter. Left ventriculography was done using a JR4catheter. A TR band was used for hemostasis.  Contrast: 100   Estimated blood loss <50 mL.   During this procedure medications were administered to achieve and maintain moderate conscious sedation while the patient's heart rate, blood pressure, and oxygen saturation were continuously monitored and I was present face-to-face 100% of this time.  Medications  (Filter: Administrations occurring from 12/10/18 0725 to 12/10/18 6754)  Medication Rate/Dose/Volume Action  Date Time   midazolam (VERSED) injection (mg) 2 mg Given 12/10/18 0745   Total dose as of 12/10/18 1358 1 mg Given 0753   3 mg        fentaNYL (SUBLIMAZE) injection (mcg) 25 mcg Given 12/10/18 0745   Total dose as of 12/10/18 1358 25 mcg Given 0753   50 mcg        lidocaine (PF) (XYLOCAINE) 1 % injection (mL) 2 mL Given 12/10/18 0750   Total dose as of 12/10/18 1358        2 mL        Heparin (Porcine) in NaCl 1000-0.9 UT/500ML-% SOLN (mL) 500 mL Given 12/10/18 0753   Total dose as of 12/10/18 1358 500 mL Given 0753   1,000 mL        Radial Cocktail/Verapamil only  (mL) 10 mL Given 12/10/18 0756   Total dose as of 12/10/18 1358        10 mL        heparin injection (Units) 3,500 Units Given 12/10/18 0758   Total dose as of 12/10/18 1358        3,500 Units        Sedation Time   Sedation Time Physician-1: 37 minutes 40 seconds  Complications   Complications documented before study signed (12/10/2018 8:40 AM EDT)    No complications were associated with this study.  Documented by Jettie Booze, MD - 12/10/2018 8:38 AM EDT    Coronary Findings   Diagnostic  Dominance: Right  Left Main  Ost LM to Mid LM lesion 60% stenosed  Ost LM to Mid LM lesion is 60% stenosed. The lesion is eccentric. The lesion is calcified.  Left Anterior Descending  Prox LAD lesion 90% stenosed  Prox LAD lesion is 90% stenosed. The lesion is severely calcified.  Right Coronary Artery  Ost RCA to Prox RCA lesion 80% stenosed  Ost RCA to Prox RCA lesion is 80% stenosed.  Mid RCA lesion 99% stenosed  Mid RCA lesion is 99% stenosed. with left-to-right collateral flow. The lesion is severely calcified.  Intervention   No interventions have been documented.  Wall Motion   Resting               Left Heart   Left Ventricle The left ventricular size is normal. The left ventricular systolic function is normal. LV end diastolic pressure is normal. The left ventricular ejection fraction is 55-65% by visual estimate. No regional wall motion abnormalities.  Aortic Valve There is no aortic valve stenosis.  Coronary Diagrams   Diagnostic  Dominance: Right  VAS US CAROTID (Accession [id number]) (Order [id number])  Vascular Ultrasound  Date: 03/26/2018 Department: [facility] Released By: Buelah Manis Authorizing: Serafina Mitchell, MD  Linked Results   Procedure Abnormality Status  VAS US CAROTID    Reason for Exam  Priority: Routine  Carotid  Dx: Carotid stenosis, bilateral [I65.23 (ICD-10-CM)]  Comments:   Exam  Information   Status Exam Begun  Exam Ended   Final [99] 03/26/2018 10:44 AM 03/26/2018 11:34 AM  03/26/2018 12:29 PM - Interface, Rad Results In   Narrative & Impression   Carotid Arterial Duplex Study  Indications:       Carotid artery disease and Right CEA 11/11/10. Comparison Study:  [facility] 01/16/18: Right: 60-79% ICA stenosis.                    Left: 40-59% ICA stenosis.  Performing Technologist: Ralene Cork RVT    Examination Guidelines: A complete evaluation includes B-mode imaging, spectral Doppler, color Doppler, and power Doppler as needed of all accessible portions of each vessel. Bilateral testing is considered an integral part of a complete examination. Limited examinations for reoccurring indications may be performed as noted.    Right Carotid Findings: +----------+--------+--------+--------+------------+-------------------------+             PSV cm/s EDV cm/s Stenosis Describe     Comments                   +----------+--------+--------+--------+------------+-------------------------+  CCA Prox   119      11                             tortuous                   +----------+--------+--------+--------+------------+-------------------------+  CCA Mid    82       12                                                        +----------+--------+--------+--------+------------+-------------------------+  CCA Distal 79       14                heterogenous                            +----------+--------+--------+--------+------------+-------------------------+  ICA Prox   288      61       60-79%   calcific     post stenotic turburlence  +----------+--------+--------+--------+------------+-------------------------+  ICA Mid    190      26                                                        +----------+--------+--------+--------+------------+-------------------------+  ICA Distal 92       20                                                         +----------+--------+--------+--------+------------+-------------------------+  ECA        87       0                                                         +----------+--------+--------+--------+------------+-------------------------+  +----------+--------+-------+----------------+-------------------+             PSV cm/s EDV cms Describe         Arm Pressure (mmHG)  +----------+--------+-------+----------------+-------------------+  Subclavian 153              Multiphasic, WNL                      +----------+--------+-------+----------------+-------------------+  +---------+--------+--+--------+-------------+  Vertebral PSV cm/s 26 EDV cm/s tardus parvus  +---------+--------+--+--------+-------------+     Left Carotid Findings: +----------+--------+--------+--------+-------------------------+--------+             PSV cm/s EDV cm/s Stenosis Describe                  Comments  +----------+--------+--------+--------+-------------------------+--------+  CCA Prox   170      30                                                    +----------+--------+--------+--------+-------------------------+--------+  CCA Mid    137      25                                                    +----------+--------+--------+--------+-------------------------+--------+  CCA Distal 111      21                heterogenous                        +----------+--------+--------+--------+-------------------------+--------+  ICA Prox   136      32       40-59%   heterogenous and calcific           +----------+--------+--------+--------+-------------------------+--------+  ICA Mid    71       19                                                    +----------+--------+--------+--------+-------------------------+--------+  ICA Distal 98       22                                                    +----------+--------+--------+--------+-------------------------+--------+  ECA        87       0                                                      +----------+--------+--------+--------+-------------------------+--------+   +----------+--------+--------+----------------+-------------------+  Subclavian PSV cm/s EDV cm/s Describe         Arm Pressure (mmHG)  +----------+--------+--------+----------------+-------------------+             171               Multiphasic, WNL                      +----------+--------+--------+----------------+-------------------+  +---------+--------+--+--------+--+---------+  Vertebral PSV cm/s 73 EDV cm/s 18 Antegrade  +---------+--------+--+--------+--+---------+    Final Interpretation: Right Carotid: Velocities in the right ICA are consistent with a 60-79%                stenosis.  Left Carotid: Velocities in the left ICA are consistent with a 40-59% stenosis.  Vertebrals:  Bilateral vertebral arteries demonstrate antegrade flow. Right              vertebral artery waveform appears blunted. Subclavians: Normal flow hemodynamics were seen in bilateral subclavian              arteries.  *See table(s) above for measurements and observations.     Electronically signed by Harold Barban MD on 03/26/2018 at 12:29:05 PM.      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 Shirlee More in [place] for evaluation of a prolonged episode of chest pain.  She subsequently had a positive nuclear stress test and went on to have left heart catheterization earlier today.  She has a 60% left main coronary artery  stenosis as well as high-grade lesions involving the LAD and right coronary system.  Ejection fraction is well-preserved at 55 to 60%. She had no aortic stenosis.  Coronary bypass grafting is indicated for survival benefit and for control of her recurrent angina.  We will initiate work-up to evaluate operative risks including carotid duplex scan, peripheral ultrasound, and lower extremity venous mapping given her history of DVT and the finding of lower extremity varicosities.  It echocardiogram obtained by Dr. Bettina Gavia on 10/22/2018 showed ejection fraction 60 to 65% and trace MR but was otherwise unremarkable.  She is to be started on a heparin drip to mitigate her risk for recurrent DVT and PE as well as for optimal coronary perfusion.  She does have a history of stage III chronic kidney disease although creatinine on admission labs was 0.9.   On review of the patiens history and studies with symptoms ,positive stress  test and left min and 2 vessel cad cabg offers best option for review of symptoms and preservation of lv function.  [place] cabg tomorrow The goals risks and alternatives of the planned surgical procedure CABG  have been discussed with the patient in detail. The risks of the procedure including death, infection, stroke, myocardial infarction, bleeding, blood transfusion have all been discussed specifically.  I have quoted Tina Dominguez a 4 % of perioperative mortality and a complication rate as high as 40 %.  The patient's questions have been answered.Tina Dominguez is willing  to proceed with the planned procedure.

## 2018-12-10 NOTE — Progress Notes (Signed)
ANTICOAGULATION CONSULT NOTE - Initial Consult  Pharmacy Consult for heparin Indication: chest pain/ACS  No Known Allergies  Patient Measurements: Height: 5\' 3"  (160 cm) Weight: 160 lb (72.6 kg) IBW/kg (Calculated) : 52.4 Heparin Dosing Weight: 67kg  Vital Signs: Temp: 97.4 F (36.3 C) (03/16 0544) Temp Source: Oral (03/16 0544) BP: 180/57 (03/16 1215) Pulse Rate: 57 (03/16 1215)  Labs: No results for input(s): HGB, HCT, PLT, APTT, LABPROT, INR, HEPARINUNFRC, HEPRLOWMOCWT, CREATININE, CKTOTAL, CKMB, TROPONINI in the last 72 hours.  Estimated Creatinine Clearance: 51 mL/min (by C-G formula based on SCr of 0.91 mg/dL).   Medical History: Past Medical History:  Diagnosis Date  . Aftercare following surgery of the circulatory system, [place] 10/14/2013  . Anemia 12/13/2017  . Anxiety 12/13/2017  . Arthritis   . Carotid artery occlusion   . Clotting disorder ([place])   . Depression   . Dysphagia 04/17/2017  . GERD (gastroesophageal reflux disease) 05/26/2011  . GI bleeding 10/04/2017  . Gout 12/13/2017  . Hiatal hernia 12/13/2017  . 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   Marland Kitchen History of recurrent TIAs 12/13/2017  . History of right-sided carotid endarterectomy 10/10/2011  . Hyperlipidemia   . Hypertension   . Leg pain    with walking  . Occlusion and stenosis of carotid artery without mention of cerebral infarction 10/08/2012  . Osteoarthritis 05/26/2011  . Osteoporosis   . PE (pulmonary embolism) 05/26/2011  . 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 Reflux   . Renal failure 12/13/2017  . S/P IVC filter 12/13/2017  . Schatzki's ring 12/13/2017  . Stroke [facility])    X's 4  . Valvular heart disease 12/13/2017     Assessment: 19 yoF admitted for cath found to have severe multivessel CAD. Pharmacy to begin IV heparin while awaiting TCTS evaluation for CABG. Note, pt on rivaroxaban PTA  for hx of VTE (last dose 3/13). Will check aPTT with initial heparin level in case DOAC still skewing levels.  Goal of Therapy:  Heparin level 0.3-0.7 units/ml Monitor platelets by anticoagulation protocol: Yes   Plan:  -Start heparin 950 units/hr at 1630 -Check 8hr heparin level and aPTT  Arrie Senate, PharmD, BCPS Clinical Pharmacist [phone] Please check AMION for all [place] numbers 12/10/2018

## 2018-12-10 NOTE — Progress Notes (Signed)
TCTS consulted for CABG evaluation. °

## 2018-12-10 NOTE — Progress Notes (Signed)
TR BAND REMOVAL  LOCATION:    Right radial   DEFLATED PER PROTOCOL:    Yes.    TIME BAND OFF / DRESSING APPLIED:    1100am A Clean dry dressing with gauze, tegaderm and Coban applied.   SITE UPON ARRIVAL:    Level 1-bruise  SITE AFTER BAND REMOVAL:    Level 1- bruise CIRCULATION SENSATION AND MOVEMENT:    Within Normal Limits   Yes.    COMMENTS:   Care instruction  Given to pt.  Note sore to touch below site.  Pt able to move fingers freely without discomfort.

## 2018-12-10 NOTE — Interval H&P Note (Signed)
Cath Lab Visit (complete for each Cath Lab visit)  Clinical Evaluation Leading to the Procedure:   ACS: No.  Non-ACS:    Anginal Classification: CCS III  Anti-ischemic medical therapy: Minimal Therapy (1 class of medications)  Non-Invasive Test Results: Intermediate-risk stress test findings: cardiac mortality 1-3%/year  Prior CABG: No previous CABG      History and Physical Interval Note:  12/10/2018 7:43 AM  Tina Dominguez  has presented today for surgery, with the diagnosis of Abnormal nuclear stress test.  The various methods of treatment have been discussed with the patient and family. After consideration of risks, benefits and other options for treatment, the patient has consented to  Procedure(s): LEFT HEART CATH AND CORONARY ANGIOGRAPHY (N/A) as a surgical intervention.  The patient's history has been reviewed, patient examined, no change in status, stable for surgery.  I have reviewed the patient's chart and labs.  Questions were answered to the patient's satisfaction.     Larae Grooms

## 2018-12-10 NOTE — Progress Notes (Signed)
Pre-CABG Dopplers and lower extremity vein mapping completed. Refer to "CV Proc" under chart review to view preliminary results.  12/10/2018 5:20 PM Maudry Mayhew, MHA, RVT, RDCS, RDMS

## 2018-12-11 ENCOUNTER — Inpatient Hospital Stay (HOSPITAL_COMMUNITY): Payer: PPO

## 2018-12-11 ENCOUNTER — Encounter (HOSPITAL_COMMUNITY): Payer: Self-pay | Admitting: Certified Registered Nurse Anesthetist

## 2018-12-11 ENCOUNTER — Inpatient Hospital Stay (HOSPITAL_COMMUNITY): Payer: PPO | Admitting: Certified Registered Nurse Anesthetist

## 2018-12-11 ENCOUNTER — Encounter (HOSPITAL_COMMUNITY)
Admission: RE | Disposition: A | Discharge status: Home / Self Care | Payer: Self-pay | Source: Home / Self Care | Attending: Cardiothoracic Surgery

## 2018-12-11 DIAGNOSIS — Z951 Presence of aortocoronary bypass graft: Secondary | ICD-10-CM

## 2018-12-11 DIAGNOSIS — I2510 Atherosclerotic heart disease of native coronary artery without angina pectoris: Secondary | ICD-10-CM | POA: Diagnosis present

## 2018-12-11 DIAGNOSIS — R9439 Abnormal result of other cardiovascular function study: Secondary | ICD-10-CM

## 2018-12-11 DIAGNOSIS — I25110 Atherosclerotic heart disease of native coronary artery with unstable angina pectoris: Secondary | ICD-10-CM

## 2018-12-11 HISTORY — PX: CORONARY ARTERY BYPASS GRAFT: SHX141

## 2018-12-11 HISTORY — PX: TEE WITHOUT CARDIOVERSION: SHX5443

## 2018-12-11 LAB — APTT
aPTT: 100 seconds — ABNORMAL HIGH (ref 24–36)
aPTT: 33 seconds (ref 24–36)

## 2018-12-11 LAB — BASIC METABOLIC PANEL
Anion gap: 9 (ref 5–15)
BUN: 13 mg/dL (ref 8–23)
CO2: 20 mmol/L — ABNORMAL LOW (ref 22–32)
Calcium: 8.7 mg/dL — ABNORMAL LOW (ref 8.9–10.3)
Chloride: 111 mmol/L (ref 98–111)
Creatinine, Ser: 0.8 mg/dL (ref 0.44–1.00)
GFR calc Af Amer: >60 mL/min (ref >60)
GFR calc non Af Amer: >60 mL/min (ref >60)
Glucose, Bld: 105 mg/dL — ABNORMAL HIGH (ref 70–99)
Potassium: 3.2 mmol/L — ABNORMAL LOW (ref 3.5–5.1)
Sodium: 140 mmol/L (ref 135–145)

## 2018-12-11 LAB — CBC
HCT: 33.8 % — ABNORMAL LOW (ref 36.0–46.0)
HCT: 34.9 % — ABNORMAL LOW (ref 36.0–46.0)
HCT: 35.3 % — ABNORMAL LOW (ref 36.0–46.0)
HEMOGLOBIN: 11.7 g/dL — AB (ref 12.0–15.0)
Hemoglobin: 11.6 g/dL — ABNORMAL LOW (ref 12.0–15.0)
Hemoglobin: 10.9 g/dL — ABNORMAL LOW (ref 12.0–15.0)
MCH: 28.9 pg (ref 26.0–34.0)
MCH: 29.1 pg (ref 26.0–34.0)
MCH: 29.6 pg (ref 26.0–34.0)
MCHC: 32.2 g/dL (ref 30.0–36.0)
MCHC: 33.1 g/dL (ref 30.0–36.0)
MCHC: 33.2 g/dL (ref 30.0–36.0)
MCV: 87.5 fL (ref 80.0–100.0)
MCV: 89.4 fL (ref 80.0–100.0)
MCV: 89.7 fL (ref 80.0–100.0)
Platelets: 227 10*3/uL (ref 150–400)
Platelets: 228 10*3/uL (ref 150–400)
Platelets: 304 10*3/uL (ref 150–400)
RBC: 3.77 MIL/uL — ABNORMAL LOW (ref 3.87–5.11)
RBC: 3.95 MIL/uL (ref 3.87–5.11)
RBC: 3.99 MIL/uL (ref 3.87–5.11)
RDW: 12.2 % (ref 11.5–15.5)
RDW: 12.9 % (ref 11.5–15.5)
RDW: 13.4 % (ref 11.5–15.5)
WBC: 17.6 10*3/uL — ABNORMAL HIGH (ref 4.0–10.5)
WBC: 17.8 10*3/uL — ABNORMAL HIGH (ref 4.0–10.5)
WBC: 7.6 10*3/uL (ref 4.0–10.5)
nRBC: 0 % (ref 0.0–0.2)
nRBC: 0 % (ref 0.0–0.2)
nRBC: 0 % (ref 0.0–0.2)

## 2018-12-11 LAB — POCT I-STAT 7, (LYTES, BLD GAS, ICA,H+H)
Acid-base deficit: 2 mmol/L (ref 0.0–2.0)
Acid-base deficit: 4 mmol/L — ABNORMAL HIGH (ref 0.0–2.0)
Acid-base deficit: 5 mmol/L — ABNORMAL HIGH (ref 0.0–2.0)
Bicarbonate: 23.1 mmol/L (ref 20.0–28.0)
Bicarbonate: 21.7 mmol/L (ref 20.0–28.0)
Bicarbonate: 20.5 mmol/L (ref 20.0–28.0)
Calcium, Ion: 1.11 mmol/L — ABNORMAL LOW (ref 1.15–1.40)
Calcium, Ion: 1.14 mmol/L — ABNORMAL LOW (ref 1.15–1.40)
Calcium, Ion: 1.19 mmol/L (ref 1.15–1.40)
HCT: 33 % — ABNORMAL LOW (ref 36.0–46.0)
HCT: 33 % — ABNORMAL LOW (ref 36.0–46.0)
HEMATOCRIT: 30 % — AB (ref 36.0–46.0)
HEMOGLOBIN: 11.2 g/dL — AB (ref 12.0–15.0)
Hemoglobin: 11.2 g/dL — ABNORMAL LOW (ref 12.0–15.0)
Hemoglobin: 10.2 g/dL — ABNORMAL LOW (ref 12.0–15.0)
O2 Saturation: 96 %
O2 Saturation: 98 %
O2 Saturation: 98 %
PCO2 ART: 40.9 mmHg (ref 32.0–48.0)
POTASSIUM: 4 mmol/L (ref 3.5–5.1)
Patient temperature: 35.7
Patient temperature: 36.3
Patient temperature: 36.8
Potassium: 3.1 mmol/L — ABNORMAL LOW (ref 3.5–5.1)
Potassium: 4.1 mmol/L (ref 3.5–5.1)
Sodium: 143 mmol/L (ref 135–145)
Sodium: 141 mmol/L (ref 135–145)
Sodium: 138 mmol/L (ref 135–145)
TCO2: 24 mmol/L (ref 22–32)
TCO2: 23 mmol/L (ref 22–32)
TCO2: 22 mmol/L (ref 22–32)
pCO2 arterial: 36.2 mmHg (ref 32.0–48.0)
pCO2 arterial: 39.8 mmHg (ref 32.0–48.0)
pH, Arterial: 7.408 (ref 7.350–7.450)
pH, Arterial: 7.341 — ABNORMAL LOW (ref 7.350–7.450)
pH, Arterial: 7.307 — ABNORMAL LOW (ref 7.350–7.450)
pO2, Arterial: 79 mmHg — ABNORMAL LOW (ref 83.0–108.0)
pO2, Arterial: 115 mmHg — ABNORMAL HIGH (ref 83.0–108.0)
pO2, Arterial: 113 mmHg — ABNORMAL HIGH (ref 83.0–108.0)

## 2018-12-11 LAB — GLUCOSE, CAPILLARY
Glucose-Capillary: 95 mg/dL (ref 70–99)
Glucose-Capillary: 110 mg/dL — ABNORMAL HIGH (ref 70–99)
Glucose-Capillary: 117 mg/dL — ABNORMAL HIGH (ref 70–99)
Glucose-Capillary: 146 mg/dL — ABNORMAL HIGH (ref 70–99)
Glucose-Capillary: 168 mg/dL — ABNORMAL HIGH (ref 70–99)

## 2018-12-11 LAB — PROTIME-INR
INR: 1.4 — ABNORMAL HIGH (ref 0.8–1.2)
PROTHROMBIN TIME: 16.7 s — AB (ref 11.4–15.2)

## 2018-12-11 LAB — POCT I-STAT 4, (NA,K, GLUC, HGB,HCT)
Glucose, Bld: 86 mg/dL (ref 70–99)
HCT: 33 % — ABNORMAL LOW (ref 36.0–46.0)
Hemoglobin: 11.2 g/dL — ABNORMAL LOW (ref 12.0–15.0)
Potassium: 3.1 mmol/L — ABNORMAL LOW (ref 3.5–5.1)
Sodium: 142 mmol/L (ref 135–145)

## 2018-12-11 LAB — PLATELET COUNT: Platelets: 229 10*3/uL (ref 150–400)

## 2018-12-11 LAB — PREPARE RBC (CROSSMATCH)

## 2018-12-11 LAB — MAGNESIUM: Magnesium: 3.1 mg/dL — ABNORMAL HIGH (ref 1.7–2.4)

## 2018-12-11 LAB — HEPARIN LEVEL (UNFRACTIONATED): Heparin Unfractionated: 0.54 IU/mL (ref 0.30–0.70)

## 2018-12-11 LAB — HEMOGLOBIN AND HEMATOCRIT, BLOOD
HCT: 27.4 % — ABNORMAL LOW (ref 36.0–46.0)
Hemoglobin: 9.1 g/dL — ABNORMAL LOW (ref 12.0–15.0)

## 2018-12-11 LAB — CREATININE, SERUM
Creatinine, Ser: 0.83 mg/dL (ref 0.44–1.00)
GFR calc Af Amer: >60 mL/min (ref >60)
GFR calc non Af Amer: >60 mL/min (ref >60)

## 2018-12-11 SURGERY — CORONARY ARTERY BYPASS GRAFTING (CABG)
Anesthesia: General | Site: Chest

## 2018-12-11 MED ORDER — CHLORHEXIDINE GLUCONATE 0.12% ORAL RINSE (MEDLINE KIT): 15.0000 mL | Freq: Two times a day (BID) | OROMUCOSAL | Status: DC

## 2018-12-11 MED ORDER — TRAMADOL HCL 50 MG PO TABS
50.0000 mg | ORAL_TABLET | ORAL | Status: DC | PRN
  Administered 2018-12-11 – 2018-12-17 (×10): 50 mg via ORAL
  Filled 2018-12-11 (×10): qty 1

## 2018-12-11 MED ORDER — LACTATED RINGERS IV SOLN: INTRAVENOUS | Status: DC

## 2018-12-11 MED ORDER — NITROGLYCERIN IN D5W 200-5 MCG/ML-% IV SOLN: 0.0000 ug/min | INTRAVENOUS | Status: DC

## 2018-12-11 MED ORDER — SODIUM CHLORIDE 0.9% FLUSH
10.0000 mL | Freq: Two times a day (BID) | INTRAVENOUS | Status: DC
  Administered 2018-12-11 – 2018-12-18 (×7): 10 mL

## 2018-12-11 MED ORDER — EPHEDRINE 5 MG/ML INJ
INTRAVENOUS | Status: AC
  Filled 2018-12-11: qty 10

## 2018-12-11 MED ORDER — PHENYLEPHRINE HCL 10 MG/ML IJ SOLN
INTRAMUSCULAR | Status: DC | PRN
  Administered 2018-12-11 (×2): 40 ug via INTRAVENOUS

## 2018-12-11 MED ORDER — PROTAMINE SULFATE 10 MG/ML IV SOLN
INTRAVENOUS | Status: AC
  Filled 2018-12-11: qty 25

## 2018-12-11 MED ORDER — ACETAMINOPHEN 160 MG/5ML PO SOLN: 650.0000 mg | Freq: Once | ORAL | Status: AC

## 2018-12-11 MED ORDER — PHENYLEPHRINE 40 MCG/ML (10ML) SYRINGE FOR IV PUSH (FOR BLOOD PRESSURE SUPPORT)
PREFILLED_SYRINGE | INTRAVENOUS | Status: AC
  Filled 2018-12-11: qty 10

## 2018-12-11 MED ORDER — CHLORHEXIDINE GLUCONATE 0.12 % MT SOLN
15.0000 mL | OROMUCOSAL | Status: AC
  Administered 2018-12-11: 15 mL via OROMUCOSAL

## 2018-12-11 MED ORDER — ROCURONIUM BROMIDE 100 MG/10ML IV SOLN
INTRAVENOUS | Status: DC | PRN
  Administered 2018-12-11 (×2): 50 mg via INTRAVENOUS
  Administered 2018-12-11: 70 mg via INTRAVENOUS
  Administered 2018-12-11: 30 mg via INTRAVENOUS

## 2018-12-11 MED ORDER — PHENYLEPHRINE HCL-NACL 20-0.9 MG/250ML-% IV SOLN: 0.0000 ug/min | INTRAVENOUS | Status: DC

## 2018-12-11 MED ORDER — MIRTAZAPINE 30 MG PO TABS
30.0000 mg | ORAL_TABLET | Freq: Every day | ORAL | Status: DC
  Administered 2018-12-12 – 2018-12-14 (×3): 30 mg via ORAL
  Filled 2018-12-11: qty 1
  Filled 2018-12-11: qty 2
  Filled 2018-12-11: qty 1
  Filled 2018-12-11 (×2): qty 2
  Filled 2018-12-11: qty 1

## 2018-12-11 MED ORDER — INSULIN ASPART 100 UNIT/ML ~~LOC~~ SOLN
0.0000 [IU] | SUBCUTANEOUS | Status: DC
  Administered 2018-12-11: 2 [IU] via SUBCUTANEOUS
  Administered 2018-12-11: 4 [IU] via SUBCUTANEOUS
  Administered 2018-12-12 (×3): 2 [IU] via SUBCUTANEOUS

## 2018-12-11 MED ORDER — METOPROLOL TARTRATE 5 MG/5ML IV SOLN
2.5000 mg | INTRAVENOUS | Status: DC | PRN
  Administered 2018-12-12 – 2018-12-17 (×2): 5 mg via INTRAVENOUS
  Filled 2018-12-11 (×2): qty 5

## 2018-12-11 MED ORDER — POTASSIUM CHLORIDE 10 MEQ/50ML IV SOLN
10.0000 meq | INTRAVENOUS | Status: AC
  Administered 2018-12-11 (×3): 10 meq via INTRAVENOUS

## 2018-12-11 MED ORDER — MIDAZOLAM HCL 5 MG/5ML IJ SOLN
INTRAMUSCULAR | Status: DC | PRN
  Administered 2018-12-11: 1 mg via INTRAVENOUS
  Administered 2018-12-11: 2 mg via INTRAVENOUS
  Administered 2018-12-11 (×2): 1 mg via INTRAVENOUS

## 2018-12-11 MED ORDER — METOPROLOL TARTRATE 12.5 MG HALF TABLET
12.5000 mg | ORAL_TABLET | Freq: Two times a day (BID) | ORAL | Status: DC
  Administered 2018-12-12 – 2018-12-18 (×12): 12.5 mg via ORAL
  Filled 2018-12-11 (×12): qty 1

## 2018-12-11 MED ORDER — VANCOMYCIN HCL IN DEXTROSE 1-5 GM/200ML-% IV SOLN
1000.0000 mg | Freq: Once | INTRAVENOUS | Status: AC
  Administered 2018-12-11: 1000 mg via INTRAVENOUS
  Filled 2018-12-11: qty 200

## 2018-12-11 MED ORDER — ALBUMIN HUMAN 5 % IV SOLN
250.0000 mL | INTRAVENOUS | Status: AC | PRN
  Administered 2018-12-11 – 2018-12-12 (×3): 12.5 g via INTRAVENOUS
  Filled 2018-12-11 (×2): qty 250

## 2018-12-11 MED ORDER — MIDAZOLAM HCL 2 MG/2ML IJ SOLN: 2.0000 mg | INTRAMUSCULAR | Status: DC | PRN

## 2018-12-11 MED ORDER — SODIUM CHLORIDE 0.9% FLUSH
10.0000 mL | INTRAVENOUS | Status: DC | PRN
  Administered 2018-12-11: 10 mL

## 2018-12-11 MED ORDER — FENTANYL CITRATE (PF) 250 MCG/5ML IJ SOLN
INTRAMUSCULAR | Status: DC | PRN
  Administered 2018-12-11 (×2): 150 ug via INTRAVENOUS
  Administered 2018-12-11: 50 ug via INTRAVENOUS
  Administered 2018-12-11 (×3): 100 ug via INTRAVENOUS
  Administered 2018-12-11: 250 ug via INTRAVENOUS
  Administered 2018-12-11: 50 ug via INTRAVENOUS

## 2018-12-11 MED ORDER — SODIUM CHLORIDE (PF) 0.9 % IJ SOLN
OROMUCOSAL | Status: DC | PRN
  Administered 2018-12-11 (×3): 4 mL via TOPICAL

## 2018-12-11 MED ORDER — PROTAMINE SULFATE 10 MG/ML IV SOLN
INTRAVENOUS | Status: DC | PRN
  Administered 2018-12-11: 250 mg via INTRAVENOUS

## 2018-12-11 MED ORDER — ASPIRIN EC 325 MG PO TBEC
325.0000 mg | DELAYED_RELEASE_TABLET | Freq: Every day | ORAL | Status: DC
  Administered 2018-12-13 – 2018-12-17 (×5): 325 mg via ORAL
  Filled 2018-12-11 (×5): qty 1

## 2018-12-11 MED ORDER — LACTATED RINGERS IV SOLN
INTRAVENOUS | Status: DC | PRN
  Administered 2018-12-11 (×2): via INTRAVENOUS

## 2018-12-11 MED ORDER — 0.9 % SODIUM CHLORIDE (POUR BTL) OPTIME
TOPICAL | Status: DC | PRN
  Administered 2018-12-11: 6000 mL

## 2018-12-11 MED ORDER — LACTATED RINGERS IV SOLN
INTRAVENOUS | Status: DC | PRN
  Administered 2018-12-11: 07:00:00 via INTRAVENOUS

## 2018-12-11 MED ORDER — SODIUM CHLORIDE 0.9 % IV SOLN
INTRAVENOUS | Status: DC | PRN
  Administered 2018-12-11: 10 ug/min via INTRAVENOUS

## 2018-12-11 MED ORDER — FAMOTIDINE 20 MG IN NS 100 ML IVPB
20.0000 mg | Freq: Two times a day (BID) | INTRAVENOUS | Status: AC
  Administered 2018-12-11 (×2): 20 mg via INTRAVENOUS
  Filled 2018-12-11 (×2): qty 100

## 2018-12-11 MED ORDER — ACETAMINOPHEN 500 MG PO TABS
1000.0000 mg | ORAL_TABLET | Freq: Four times a day (QID) | ORAL | Status: AC
  Administered 2018-12-12 – 2018-12-16 (×18): 1000 mg via ORAL
  Filled 2018-12-11 (×18): qty 2

## 2018-12-11 MED ORDER — ORAL CARE MOUTH RINSE: 15.0000 mL | OROMUCOSAL | Status: DC

## 2018-12-11 MED ORDER — ACETAMINOPHEN 160 MG/5ML PO SOLN: 1000.0000 mg | Freq: Four times a day (QID) | ORAL | Status: AC

## 2018-12-11 MED ORDER — ACETAMINOPHEN 650 MG RE SUPP
650.0000 mg | Freq: Once | RECTAL | Status: AC
  Administered 2018-12-11: 650 mg via RECTAL

## 2018-12-11 MED ORDER — ASPIRIN 81 MG PO CHEW
324.0000 mg | CHEWABLE_TABLET | Freq: Every day | ORAL | Status: DC
  Administered 2018-12-12: 324 mg
  Filled 2018-12-11: qty 4

## 2018-12-11 MED ORDER — PROPOFOL 10 MG/ML IV BOLUS
INTRAVENOUS | Status: DC | PRN
  Administered 2018-12-11: 60 mg via INTRAVENOUS

## 2018-12-11 MED ORDER — METOPROLOL TARTRATE 25 MG/10 ML ORAL SUSPENSION
12.5000 mg | Freq: Two times a day (BID) | ORAL | Status: DC
  Filled 2018-12-11 (×6): qty 5

## 2018-12-11 MED ORDER — INSULIN REGULAR(HUMAN) IN NACL 100-0.9 UT/100ML-% IV SOLN: INTRAVENOUS | Status: DC

## 2018-12-11 MED ORDER — OXYCODONE HCL 5 MG PO TABS
5.0000 mg | ORAL_TABLET | ORAL | Status: DC | PRN
  Administered 2018-12-11 – 2018-12-18 (×28): 5 mg via ORAL
  Filled 2018-12-11 (×28): qty 1

## 2018-12-11 MED ORDER — MORPHINE SULFATE (PF) 2 MG/ML IV SOLN
1.0000 mg | INTRAVENOUS | Status: DC | PRN
  Administered 2018-12-11: 4 mg via INTRAVENOUS
  Administered 2018-12-11 (×2): 2 mg via INTRAVENOUS
  Administered 2018-12-11: 4 mg via INTRAVENOUS
  Administered 2018-12-12: 2 mg via INTRAVENOUS
  Administered 2018-12-12: 4 mg via INTRAVENOUS
  Administered 2018-12-12: 2 mg via INTRAVENOUS
  Administered 2018-12-12: 4 mg via INTRAVENOUS
  Administered 2018-12-12 (×2): 2 mg via INTRAVENOUS
  Administered 2018-12-12: 4 mg via INTRAVENOUS
  Administered 2018-12-12 (×2): 2 mg via INTRAVENOUS
  Administered 2018-12-12 – 2018-12-13 (×5): 4 mg via INTRAVENOUS
  Filled 2018-12-11 (×2): qty 2
  Filled 2018-12-11: qty 1
  Filled 2018-12-11: qty 2
  Filled 2018-12-11: qty 1
  Filled 2018-12-11 (×3): qty 2
  Filled 2018-12-11 (×3): qty 1
  Filled 2018-12-11 (×4): qty 2
  Filled 2018-12-11 (×3): qty 1

## 2018-12-11 MED ORDER — MIDAZOLAM HCL (PF) 10 MG/2ML IJ SOLN
INTRAMUSCULAR | Status: AC
  Filled 2018-12-11: qty 2

## 2018-12-11 MED ORDER — ORAL CARE MOUTH RINSE
15.0000 mL | Freq: Two times a day (BID) | OROMUCOSAL | Status: DC
  Administered 2018-12-11 – 2018-12-16 (×6): 15 mL via OROMUCOSAL

## 2018-12-11 MED ORDER — BISACODYL 5 MG PO TBEC
10.0000 mg | DELAYED_RELEASE_TABLET | Freq: Every day | ORAL | Status: DC
  Administered 2018-12-12 – 2018-12-18 (×3): 10 mg via ORAL
  Filled 2018-12-11 (×6): qty 2

## 2018-12-11 MED ORDER — FENTANYL CITRATE (PF) 250 MCG/5ML IJ SOLN
INTRAMUSCULAR | Status: AC
  Filled 2018-12-11: qty 25

## 2018-12-11 MED ORDER — HEMOSTATIC AGENTS (NO CHARGE) OPTIME
TOPICAL | Status: DC | PRN
  Administered 2018-12-11 (×2): 1 via TOPICAL

## 2018-12-11 MED ORDER — HEPARIN SODIUM (PORCINE) 1000 UNIT/ML IJ SOLN
INTRAMUSCULAR | Status: DC | PRN
  Administered 2018-12-11: 25000 [IU] via INTRAVENOUS

## 2018-12-11 MED ORDER — CHLORHEXIDINE GLUCONATE CLOTH 2 % EX PADS
6.0000 | MEDICATED_PAD | Freq: Every day | CUTANEOUS | Status: DC
  Administered 2018-12-11 – 2018-12-15 (×4): 6 via TOPICAL

## 2018-12-11 MED ORDER — SODIUM CHLORIDE 0.9% FLUSH: 3.0000 mL | Freq: Two times a day (BID) | INTRAVENOUS | Status: DC

## 2018-12-11 MED ORDER — SODIUM CHLORIDE 0.9 % IV SOLN
INTRAVENOUS | Status: DC | PRN
  Administered 2018-12-11: 750 mg via INTRAVENOUS

## 2018-12-11 MED ORDER — PANTOPRAZOLE SODIUM 40 MG PO TBEC
40.0000 mg | DELAYED_RELEASE_TABLET | Freq: Every day | ORAL | Status: DC
  Administered 2018-12-13 – 2018-12-18 (×6): 40 mg via ORAL
  Filled 2018-12-11 (×6): qty 1

## 2018-12-11 MED ORDER — PROPOFOL 10 MG/ML IV BOLUS
INTRAVENOUS | Status: AC
  Filled 2018-12-11: qty 20

## 2018-12-11 MED ORDER — SODIUM CHLORIDE 0.9 % IV SOLN
250.0000 mL | INTRAVENOUS | Status: DC
  Administered 2018-12-12: 250 mL via INTRAVENOUS

## 2018-12-11 MED ORDER — ONDANSETRON HCL 4 MG/2ML IJ SOLN
4.0000 mg | Freq: Four times a day (QID) | INTRAMUSCULAR | Status: DC | PRN
  Administered 2018-12-11: 4 mg via INTRAVENOUS
  Filled 2018-12-11: qty 2

## 2018-12-11 MED ORDER — HEPARIN SODIUM (PORCINE) 1000 UNIT/ML IJ SOLN
INTRAMUSCULAR | Status: AC
  Filled 2018-12-11: qty 1

## 2018-12-11 MED ORDER — DOCUSATE SODIUM 100 MG PO CAPS
200.0000 mg | ORAL_CAPSULE | Freq: Every day | ORAL | Status: DC
  Administered 2018-12-12 – 2018-12-18 (×3): 200 mg via ORAL
  Filled 2018-12-11 (×6): qty 2

## 2018-12-11 MED ORDER — LACTATED RINGERS IV SOLN: 500.0000 mL | Freq: Once | INTRAVENOUS | Status: DC | PRN

## 2018-12-11 MED ORDER — MAGNESIUM SULFATE 4 GM/100ML IV SOLN
4.0000 g | Freq: Once | INTRAVENOUS | Status: AC
  Administered 2018-12-11: 4 g via INTRAVENOUS
  Filled 2018-12-11: qty 100

## 2018-12-11 MED ORDER — INSULIN REGULAR BOLUS VIA INFUSION
0.0000 [IU] | Freq: Three times a day (TID) | INTRAVENOUS | Status: DC
  Filled 2018-12-11: qty 10

## 2018-12-11 MED ORDER — BISACODYL 10 MG RE SUPP: 10.0000 mg | Freq: Every day | RECTAL | Status: DC

## 2018-12-11 MED ORDER — DEXMEDETOMIDINE HCL IN NACL 200 MCG/50ML IV SOLN: 0.0000 ug/kg/h | INTRAVENOUS | Status: DC

## 2018-12-11 MED ORDER — SODIUM CHLORIDE 0.9% FLUSH: 3.0000 mL | INTRAVENOUS | Status: DC | PRN

## 2018-12-11 MED ORDER — SODIUM CHLORIDE 0.9 % IV SOLN: INTRAVENOUS | Status: DC

## 2018-12-11 MED ORDER — SODIUM CHLORIDE 0.45 % IV SOLN
INTRAVENOUS | Status: DC | PRN
  Administered 2018-12-11: 13:00:00 via INTRAVENOUS

## 2018-12-11 MED ORDER — SODIUM CHLORIDE 0.9 % IV SOLN
1.5000 g | Freq: Two times a day (BID) | INTRAVENOUS | Status: AC
  Administered 2018-12-11 – 2018-12-13 (×4): 1.5 g via INTRAVENOUS
  Filled 2018-12-11 (×4): qty 1.5

## 2018-12-11 SURGICAL SUPPLY — 74 items
BAG DECANTER FOR FLEXI CONT (MISCELLANEOUS) ×3 IMPLANT
BANDAGE ACE 4X5 VEL STRL LF (GAUZE/BANDAGES/DRESSINGS) ×3 IMPLANT
BANDAGE ACE 6X5 VEL STRL LF (GAUZE/BANDAGES/DRESSINGS) ×3 IMPLANT
BANDAGE ELASTIC 4 VELCRO ST LF (GAUZE/BANDAGES/DRESSINGS) ×3 IMPLANT
BANDAGE ELASTIC 6 VELCRO ST LF (GAUZE/BANDAGES/DRESSINGS) ×3 IMPLANT
BLADE STERNUM SYSTEM 6 (BLADE) ×3 IMPLANT
BLADE SURG 11 STRL SS (BLADE) ×3 IMPLANT
BNDG GAUZE ELAST 4 BULKY (GAUZE/BANDAGES/DRESSINGS) ×3 IMPLANT
CANISTER SUCT 3000ML PPV (MISCELLANEOUS) ×3 IMPLANT
CANNULA MC2 2 STG 32/40 NON-V (CANNULA) ×2 IMPLANT
CANNULA VEN 2 STAGE (MISCELLANEOUS) IMPLANT
CANNULA VENOUS 2 STG 32/40 (CANNULA) ×1
CATH CPB KIT GERHARDT (MISCELLANEOUS) ×3 IMPLANT
CATH THORACIC 28FR (CATHETERS) ×3 IMPLANT
COVER WAND RF STERILE (DRAPES) ×3 IMPLANT
CRADLE DONUT ADULT HEAD (MISCELLANEOUS) ×3 IMPLANT
DERMABOND ADVANCED (GAUZE/BANDAGES/DRESSINGS) ×1
DERMABOND ADVANCED .7 DNX12 (GAUZE/BANDAGES/DRESSINGS) ×2 IMPLANT
DRAIN CHANNEL 28F RND 3/8 FF (WOUND CARE) ×3 IMPLANT
DRAPE CARDIOVASCULAR INCISE (DRAPES) ×1
DRAPE SLUSH/WARMER DISC (DRAPES) ×3 IMPLANT
DRAPE SRG 135X102X78XABS (DRAPES) ×2 IMPLANT
DRSG AQUACEL AG ADV 3.5X14 (GAUZE/BANDAGES/DRESSINGS) ×3 IMPLANT
ELECT BLADE 4.0 EZ CLEAN MEGAD (MISCELLANEOUS) ×3
ELECT REM PT RETURN 9FT ADLT (ELECTROSURGICAL) ×6
ELECTRODE BLDE 4.0 EZ CLN MEGD (MISCELLANEOUS) ×2 IMPLANT
ELECTRODE REM PT RTRN 9FT ADLT (ELECTROSURGICAL) ×4 IMPLANT
FELT TEFLON 1X6 (MISCELLANEOUS) ×6 IMPLANT
GAUZE SPONGE 4X4 12PLY STRL (GAUZE/BANDAGES/DRESSINGS) ×6 IMPLANT
GLOVE BIO SURGEON STRL SZ 6.5 (GLOVE) ×24 IMPLANT
GLOVE BIOGEL PI IND STRL 6 (GLOVE) ×8 IMPLANT
GLOVE BIOGEL PI INDICATOR 6 (GLOVE) ×4
GOWN STRL REUS W/ TWL LRG LVL3 (GOWN DISPOSABLE) ×16 IMPLANT
GOWN STRL REUS W/TWL LRG LVL3 (GOWN DISPOSABLE) ×8
HEMOSTAT POWDER SURGIFOAM 1G (HEMOSTASIS) ×9 IMPLANT
HEMOSTAT SURGICEL 2X14 (HEMOSTASIS) ×3 IMPLANT
KIT BASIN OR (CUSTOM PROCEDURE TRAY) ×3 IMPLANT
KIT CATH SUCT 8FR (CATHETERS) ×3 IMPLANT
KIT SUCTION CATH 14FR (SUCTIONS) ×6 IMPLANT
KIT TURNOVER KIT B (KITS) ×3 IMPLANT
KIT VASOVIEW HEMOPRO 2 VH 4000 (KITS) ×3 IMPLANT
LEAD PACING MYOCARDI (MISCELLANEOUS) ×3 IMPLANT
MARKER GRAFT CORONARY BYPASS (MISCELLANEOUS) ×9 IMPLANT
NS IRRIG 1000ML POUR BTL (IV SOLUTION) ×15 IMPLANT
PACK E OPEN HEART (SUTURE) ×3 IMPLANT
PACK OPEN HEART (CUSTOM PROCEDURE TRAY) ×3 IMPLANT
PAD ARMBOARD 7.5X6 YLW CONV (MISCELLANEOUS) ×6 IMPLANT
PAD ELECT DEFIB RADIOL ZOLL (MISCELLANEOUS) ×3 IMPLANT
PENCIL BUTTON HOLSTER BLD 10FT (ELECTRODE) ×3 IMPLANT
PUNCH AORTIC ROTATE  4.5MM 8IN (MISCELLANEOUS) ×3 IMPLANT
SET CARDIOPLEGIA MPS 5001102 (MISCELLANEOUS) ×3 IMPLANT
SPONGE LAP 18X18 X RAY DECT (DISPOSABLE) ×9 IMPLANT
SUT BONE WAX W31G (SUTURE) ×3 IMPLANT
SUT MNCRL AB 4-0 PS2 18 (SUTURE) ×3 IMPLANT
SUT PROLENE 3 0 SH1 36 (SUTURE) ×6 IMPLANT
SUT PROLENE 4 0 TF (SUTURE) ×6 IMPLANT
SUT PROLENE 6 0 CC (SUTURE) ×9 IMPLANT
SUT PROLENE 7 0 BV1 MDA (SUTURE) ×6 IMPLANT
SUT PROLENE 7.0 RB 3 (SUTURE) ×3 IMPLANT
SUT PROLENE 8 0 BV175 6 (SUTURE) ×9 IMPLANT
SUT STEEL 6MS V (SUTURE) ×3 IMPLANT
SUT STEEL SZ 6 DBL 3X14 BALL (SUTURE) ×3 IMPLANT
SUT VIC AB 1 CTX 18 (SUTURE) ×6 IMPLANT
SUT VIC AB 2-0 CT1 27 (SUTURE) ×1
SUT VIC AB 2-0 CT1 TAPERPNT 27 (SUTURE) ×2 IMPLANT
SYSTEM SAHARA CHEST DRAIN ATS (WOUND CARE) ×3 IMPLANT
TAPE CLOTH SURG 4X10 WHT LF (GAUZE/BANDAGES/DRESSINGS) ×3 IMPLANT
TAPE PAPER 2X10 WHT MICROPORE (GAUZE/BANDAGES/DRESSINGS) ×3 IMPLANT
TOWEL GREEN STERILE (TOWEL DISPOSABLE) ×3 IMPLANT
TOWEL GREEN STERILE FF (TOWEL DISPOSABLE) ×3 IMPLANT
TRAY FOLEY SLVR 16FR TEMP STAT (SET/KITS/TRAYS/PACK) ×3 IMPLANT
TUBING LAP HI FLOW INSUFFLATIO (TUBING) ×3 IMPLANT
UNDERPAD 30X30 (UNDERPADS AND DIAPERS) ×3 IMPLANT
WATER STERILE IRR 1000ML POUR (IV SOLUTION) ×6 IMPLANT

## 2018-12-11 NOTE — Anesthesia Procedure Notes (Signed)
Central Venous Catheter Insertion Performed by: Roberts Gaudy, MD, anesthesiologist Start/End3/17/2020 6:35 AM, 12/11/2018 6:45 AM Patient location: Pre-op. Preanesthetic checklist: patient identified, IV checked, site marked, risks and benefits discussed, surgical consent, monitors and equipment checked, pre-op evaluation, timeout performed and anesthesia consent Hand hygiene performed  and maximum sterile barriers used  PA cath was placed.Swan type:thermodilution Procedure performed without using ultrasound guided technique. Ultrasound Notes:anatomy identified, needle tip was noted to be adjacent to the nerve/plexus identified, no ultrasound evidence of intravascular and/or intraneural injection and image(s) printed for medical record Attempts: 1 Following insertion, line sutured, dressing applied and Biopatch. Post procedure assessment: blood return through all ports and free fluid flow  Patient tolerated the procedure well with no immediate complications.

## 2018-12-11 NOTE — Progress Notes (Signed)
RT NOTES: Rapid wean protocol initiated 

## 2018-12-11 NOTE — Procedures (Signed)
Extubation Procedure Note  Patient Details:   Name: Tina Dominguez DOB: [birth year] MRN: [id number]   Airway Documentation:    Vent end date: 12/11/18 Vent end time: 1625   Evaluation  O2 sats: stable throughout Complications: No apparent complications Patient did tolerate procedure well. Bilateral Breath Sounds: Clear, Diminished   Yes   RT extubated patient to 3L [place] with RN at bedside per rapid wean protocol. Positive cuff leak noted. NIF -28 and VC 800. No stridor noted. Patient is able to speak and says her breathing is comfortable. RT worked with patient on IS and RN will continue working with patient. RT will continue to monitor.   Vernona Rieger 12/11/2018, 4:33 PM

## 2018-12-11 NOTE — Anesthesia Procedure Notes (Signed)
Procedure Name: Intubation Date/Time: 12/11/2018 7:34 AM Performed by: Shirlyn Goltz, CRNA Pre-anesthesia Checklist: Patient identified, Emergency Drugs available, Suction available and Patient being monitored Patient Re-evaluated:Patient Re-evaluated prior to induction Oxygen Delivery Method: Circle system utilized Preoxygenation: Pre-oxygenation with 100% oxygen Induction Type: IV induction Ventilation: Mask ventilation without difficulty and Oral airway inserted - appropriate to patient size Laryngoscope Size: Mac and 3 Grade View: Grade I Tube type: Oral Tube size: 8.0 mm Number of attempts: 1 Airway Equipment and Method: Stylet Placement Confirmation: ETT inserted through vocal cords under direct vision,  positive ETCO2 and breath sounds checked- equal and bilateral Secured at: 21 cm Tube secured with: Tape Dental Injury: Teeth and Oropharynx as per pre-operative assessment

## 2018-12-11 NOTE — Transfer of Care (Signed)
Immediate Anesthesia Transfer of Care Note  Patient: KHALEELAH YOWELL  Procedure(s) Performed: CORONARY ARTERY BYPASS GRAFTING (CABG) x three , using left internal mammary artery, and right leg greater saphenous vein harvested endoscopically - SVG to OM1, SVG to Distal RCA (N/A Chest) TRANSESOPHAGEAL ECHOCARDIOGRAM (TEE) (N/A )  Patient Location: ICU  Anesthesia Type:General  Level of Consciousness: sedated and Patient remains intubated per anesthesia plan  Airway & Oxygen Therap : intubated on ventilator  Post-op Assessment: Report given to RN and Post -op Vital signs reviewed and stable  118/72 per aline; 100% spo2 on 100%ambu/vent  Post vital signs: Reviewed and stable  Last Vitals:  Vitals Value Taken Time  BP 111/57 12/11/2018  1:06 PM  Temp    Pulse 80 12/11/2018  1:07 PM  Resp 12 12/11/2018  1:07 PM  SpO2 98 % 12/11/2018  1:07 PM  Vitals shown include unvalidated device data.  Last Pain:  Vitals:   12/10/18 2100  TempSrc:   PainSc: 6       Patients Stated Pain Goal: 0 (14/48/18 5631)  Complications: No apparent anesthesia complications

## 2018-12-11 NOTE — Brief Op Note (Addendum)
12/10/2018 - 12/11/2018  11:11 AM  PATIENT:  Tina Dominguez  78 y.o. female  PRE-OPERATIVE DIAGNOSIS:  CAD  POST-OPERATIVE DIAGNOSIS:  CAD  PROCEDURE:  TRANSESOPHAGEAL ECHOCARDIOGRAM (TEE), MEDIAN STERNOTOMY for CORONARY ARTERY BYPASS GRAFTING (CABG) x 3 (LIMA to LAD, SVG to OM1, SVG to DISTAL RCA)  using left internal mammary artery, and right leg greater saphenous vein harvested endoscopically   SURGEON:  Surgeon(s) and Role:    Grace Isaac, MD - Primary  PHYSICIAN ASSISTANT: Lars Pinks PA-C  ANESTHESIA:   general  EBL: Per anesthesia and perfusion record  DRAINS: Chest tubes placed in the mediastinal and pleural spaces   COUNTS CORRECT:  YES  DICTATION: .Dragon Dictation  PLAN OF CARE: Admit to inpatient   PATIENT DISPOSITION:  ICU - intubated and hemodynamically stable.   Delay start of Pharmacological VTE agent (>24hrs) due to surgical blood loss or risk of bleeding: yes  BASELINE WEIGHT: 71.8 kg

## 2018-12-11 NOTE — Anesthesia Procedure Notes (Signed)
Central Venous Catheter Insertion Performed by: Roberts Gaudy, MD, anesthesiologist Start/End3/17/2020 6:35 AM, 12/11/2018 6:45 AM Patient location: Pre-op. Preanesthetic checklist: patient identified, IV checked, site marked, risks and benefits discussed, surgical consent, monitors and equipment checked, pre-op evaluation, timeout performed and anesthesia consent Lidocaine 1% used for infiltration and patient sedated Hand hygiene performed  and maximum sterile barriers used  Catheter size: 8 Fr Total catheter length 16. Central line was placed.Double lumen Procedure performed using ultrasound guided technique. Ultrasound Notes:anatomy identified, needle tip was noted to be adjacent to the nerve/plexus identified, no ultrasound evidence of intravascular and/or intraneural injection and image(s) printed for medical record Attempts: 1 Following insertion, dressing applied and line sutured. Post procedure assessment: blood return through all ports  Patient tolerated the procedure well with no immediate complications.

## 2018-12-11 NOTE — Anesthesia Preprocedure Evaluation (Addendum)
Anesthesia Evaluation  Patient identified by MRN, date of birth, ID band Patient awake    Reviewed: Allergy & Precautions, NPO status , Patient's Chart, lab work & pertinent test results  History of Anesthesia Complications Negative for: history of anesthetic complications  Airway Mallampati: II  TM Distance: >3 FB Neck ROM: Full    Dental  (+) Dental Advisory Given   Pulmonary neg pulmonary ROS,    breath sounds clear to auscultation       Cardiovascular hypertension, + CAD   Rhythm:Regular     Neuro/Psych PSYCHIATRIC DISORDERS Anxiety Depression CVA    GI/Hepatic hiatal hernia, GERD  ,  Endo/Other    Renal/GU Renal disease     Musculoskeletal  (+) Arthritis ,   Abdominal   Peds  Hematology  (+) anemia ,   Anesthesia Other Findings  Ost LM to Mid LM lesion is 60% stenosed.  Ost RCA to Prox RCA lesion is 80% stenosed.  Mid RCA lesion is 99% stenosed.  Prox LAD lesion is 90% stenosed.  There is no aortic valve stenosis.  The left ventricular systolic function is normal.  LV end diastolic pressure is normal.  The left ventricular ejection fraction is 55-65% by visual estimate.  Tortuosity in the right subclavian made catheter engagement difficult.  Would not use right radial approach.    Nuclear stress EF: 58%.  There was no ST segment deviation noted during stress.  Defect 1: There is a medium defect of moderate severity present in the basal inferior, mid inferior and apical inferior location.  Findings consistent with ischemia.  This is an intermediate risk study.  The left ventricular ejection fraction is normal (55-65%).  Significant breast attenuation suggested on this study. This makes that study fair quality.   Reproductive/Obstetrics                            Anesthesia Physical Anesthesia Plan  ASA: IV  Anesthesia Plan: General   Post-op Pain  Management:    Induction: Intravenous  PONV Risk Score and Plan: 3 and Treatment may vary due to age or medical condition  Airway Management Planned:   Additional Equipment: Arterial line, CVP, PA Cath, TEE and Ultrasound Guidance Line Placement  Intra-op Plan:   Post-operative Plan: Post-operative intubation/ventilation  Informed Consent: I have reviewed the patients History and Physical, chart, labs and discussed the procedure including the risks, benefits and alternatives for the proposed anesthesia with the patient or authorized representative who has indicated his/her understanding and acceptance.     Dental advisory given  Plan Discussed with: CRNA and Surgeon  Anesthesia Plan Comments:         Anesthesia Quick Evaluation

## 2018-12-11 NOTE — Anesthesia Procedure Notes (Signed)
Arterial Line Insertion Start/End3/17/2020 6:55 AM, 12/11/2018 7:00 AM Performed by: Shirlyn Goltz, CRNA, CRNA  Patient location: Pre-op. Preanesthetic checklist: patient identified, IV checked, site marked, risks and benefits discussed, surgical consent, monitors and equipment checked and pre-op evaluation Lidocaine 1% used for infiltration and patient sedated radial was placed Catheter size: 20 G Hand hygiene performed , maximum sterile barriers used  and Seldinger technique used Allen's test indicative of satisfactory collateral circulation Attempts: 1 Procedure performed without using ultrasound guided technique. Following insertion, dressing applied and Biopatch. Post procedure assessment: normal

## 2018-12-11 NOTE — Progress Notes (Signed)
      [address]       [place] [address]             [phone]    Pre Procedure note for inpatients:   Tina Dominguez has been scheduled for Procedure(s): CORONARY ARTERY BYPASS GRAFTING (CABG) WITH ENDOVIEN (N/A) TRANSESOPHAGEAL ECHOCARDIOGRAM (TEE) (N/A) today. The various methods of treatment have been discussed with the patient. After consideration of the risks, benefits and treatment options the patient has consented to the planned procedure.   The patient has been seen and labs reviewed. There are no changes in the patient's condition to prevent proceeding with the planned procedure today.  Recent labs:  Lab Results  Component Value Date   WBC 7.6 12/11/2018   HGB 11.7 (L) 12/11/2018   HCT 35.3 (L) 12/11/2018   PLT 304 12/11/2018   GLUCOSE 105 (H) 12/11/2018   ALT 11 12/10/2018   AST 19 12/10/2018   NA 140 12/11/2018   K 3.2 (L) 12/11/2018   CL 111 12/11/2018   CREATININE 0.80 12/11/2018   BUN 13 12/11/2018   CO2 20 (L) 12/11/2018   INR 1.0 12/10/2018   HGBA1C 5.5 12/10/2018    Grace Isaac, MD 12/11/2018 7:28 AM

## 2018-12-11 NOTE — Progress Notes (Signed)
ANTICOAGULATION CONSULT NOTE - Follow-up Consult  Pharmacy Consult for heparin Indication: chest pain/ACS  No Known Allergies  Patient Measurements: Height: 5\' 3"  (160 cm) Weight: 160 lb (72.6 kg) IBW/kg (Calculated) : 52.4 Heparin Dosing Weight: 67kg  Vital Signs: Temp: 98 F (36.7 C) (03/16 2052) Temp Source: Oral (03/16 1415) BP: 171/70 (03/16 2052) Pulse Rate: 58 (03/16 2052)  Labs: Recent Labs    12/10/18 1429 12/10/18 1835 12/11/18 0034  HGB  --   --  11.7*  HCT  --   --  35.3*  PLT  --   --  304  APTT  --  48* 100*  LABPROT 12.7 13.3  --   INR 1.0 1.0  --   HEPARINUNFRC  --   --  0.54  CREATININE  --  0.79 0.80    Estimated Creatinine Clearance: 58 mL/min (by C-G formula based on SCr of 0.8 mg/dL).   Assessment: Tina Dominguez admitted for cath found to have severe multivessel CAD. Pharmacy to begin IV heparin while awaiting TCTS evaluation for CABG. Note, pt on rivaroxaban PTA for hx of VTE (last dose 3/13). PTT and heparin level correlating so DOAC no longer affecting heparin levels.  Heparin level and PTT therapeutic on gtt at 950 units/hr. No issues noted.  Goal of Therapy:  Heparin level 0.3-0.7 units/ml Monitor platelets by anticoagulation protocol: Yes   Plan:  -Continue heparin 950 units/hr -F/u daily heparin level and CBC  Sherlon Handing, PharmD, BCPS Clinical pharmacist  **Pharmacist phone directory can now be found on [email or web address] ([email or web address]).  Listed under [place]. 12/11/2018

## 2018-12-11 NOTE — OR Nursing (Signed)
12:08 - 45 minute call to SICU charge nurse

## 2018-12-11 NOTE — Anesthesia Procedure Notes (Signed)
Central Venous Catheter Insertion Performed by: Roberts Gaudy, MD, anesthesiologist Start/End3/17/2020 6:35 AM, 12/11/2018 6:45 AM Patient location: Pre-op. Preanesthetic checklist: patient identified, IV checked, site marked, risks and benefits discussed, surgical consent, monitors and equipment checked, pre-op evaluation, timeout performed and anesthesia consent Lidocaine 1% used for infiltration and patient sedated Hand hygiene performed  and maximum sterile barriers used  Catheter size: 8.5 Fr Sheath introducer Procedure performed using ultrasound guided technique. Ultrasound Notes:anatomy identified, needle tip was noted to be adjacent to the nerve/plexus identified, no ultrasound evidence of intravascular and/or intraneural injection and image(s) printed for medical record Attempts: 1 Following insertion, line sutured and dressing applied. Post procedure assessment: blood return through all ports, free fluid flow and no air  Patient tolerated the procedure well with no immediate complications.

## 2018-12-11 NOTE — Progress Notes (Signed)
      [address]       [place] [address]             [phone]      S/p CABG x3  Extubated BP 111/71   Pulse 80   Temp 98.1 F (36.7 C)   Resp (!) 22   Ht 5\' 3"  (1.6 m)   Wt 71.8 kg   SpO2 100%   BMI 28.04 kg/m  CI= 2.3  Intake/Output Summary (Last 24 hours) at 12/11/2018 1843 Last data filed at 12/11/2018 1800 Gross per 24 hour  Intake 3793.28 ml  Output 3322 ml  Net 471.28 ml   Minimal CT output Hct= 33  Doing well early postop  Remo Lipps C. Roxan Hockey, MD [facility] [phone]

## 2018-12-11 NOTE — OR Nursing (Signed)
12:35pm - 20 minute call to SICU nurse

## 2018-12-12 ENCOUNTER — Inpatient Hospital Stay (HOSPITAL_COMMUNITY): Payer: PPO

## 2018-12-12 ENCOUNTER — Encounter (HOSPITAL_COMMUNITY): Payer: Self-pay | Admitting: Cardiothoracic Surgery

## 2018-12-12 LAB — CBC
HCT: 30.6 % — ABNORMAL LOW (ref 36.0–46.0)
HCT: 30.3 % — ABNORMAL LOW (ref 36.0–46.0)
Hemoglobin: 10.3 g/dL — ABNORMAL LOW (ref 12.0–15.0)
Hemoglobin: 9.4 g/dL — ABNORMAL LOW (ref 12.0–15.0)
MCH: 30.5 pg (ref 26.0–34.0)
MCH: 28.9 pg (ref 26.0–34.0)
MCHC: 33.7 g/dL (ref 30.0–36.0)
MCHC: 31 g/dL (ref 30.0–36.0)
MCV: 90.5 fL (ref 80.0–100.0)
MCV: 93.2 fL (ref 80.0–100.0)
Platelets: 249 10*3/uL (ref 150–400)
Platelets: 215 10*3/uL (ref 150–400)
RBC: 3.38 MIL/uL — ABNORMAL LOW (ref 3.87–5.11)
RBC: 3.25 MIL/uL — ABNORMAL LOW (ref 3.87–5.11)
RDW: 13.7 % (ref 11.5–15.5)
RDW: 13.8 % (ref 11.5–15.5)
WBC: 17 10*3/uL — AB (ref 4.0–10.5)
WBC: 15.8 10*3/uL — ABNORMAL HIGH (ref 4.0–10.5)
nRBC: 0 % (ref 0.0–0.2)
nRBC: 0 % (ref 0.0–0.2)

## 2018-12-12 LAB — MAGNESIUM
MAGNESIUM: 2.4 mg/dL (ref 1.7–2.4)
Magnesium: 2.3 mg/dL (ref 1.7–2.4)

## 2018-12-12 LAB — BASIC METABOLIC PANEL
ANION GAP: 9 (ref 5–15)
Anion gap: 4 — ABNORMAL LOW (ref 5–15)
BUN: 13 mg/dL (ref 8–23)
BUN: 17 mg/dL (ref 8–23)
CO2: 22 mmol/L (ref 22–32)
CO2: 20 mmol/L — ABNORMAL LOW (ref 22–32)
Calcium: 7.8 mg/dL — ABNORMAL LOW (ref 8.9–10.3)
Calcium: 9 mg/dL (ref 8.9–10.3)
Chloride: 111 mmol/L (ref 98–111)
Chloride: 108 mmol/L (ref 98–111)
Creatinine, Ser: 0.99 mg/dL (ref 0.44–1.00)
Creatinine, Ser: 1.22 mg/dL — ABNORMAL HIGH (ref 0.44–1.00)
GFR calc Af Amer: 50 mL/min — ABNORMAL LOW (ref >60)
GFR calc non Af Amer: 56 mL/min — ABNORMAL LOW (ref >60)
GFR, EST NON AFRICAN AMERICAN: 43 mL/min — AB (ref >60)
GLUCOSE: 138 mg/dL — AB (ref 70–99)
Glucose, Bld: 145 mg/dL — ABNORMAL HIGH (ref 70–99)
Potassium: 3.9 mmol/L (ref 3.5–5.1)
Potassium: 4 mmol/L (ref 3.5–5.1)
SODIUM: 137 mmol/L (ref 135–145)
Sodium: 137 mmol/L (ref 135–145)

## 2018-12-12 LAB — GLUCOSE, CAPILLARY
Glucose-Capillary: 114 mg/dL — ABNORMAL HIGH (ref 70–99)
Glucose-Capillary: 129 mg/dL — ABNORMAL HIGH (ref 70–99)
Glucose-Capillary: 139 mg/dL — ABNORMAL HIGH (ref 70–99)
Glucose-Capillary: 144 mg/dL — ABNORMAL HIGH (ref 70–99)
Glucose-Capillary: 151 mg/dL — ABNORMAL HIGH (ref 70–99)
Glucose-Capillary: 157 mg/dL — ABNORMAL HIGH (ref 70–99)

## 2018-12-12 MED ORDER — INSULIN ASPART 100 UNIT/ML ~~LOC~~ SOLN
0.0000 [IU] | SUBCUTANEOUS | Status: DC
  Administered 2018-12-12: 4 [IU] via SUBCUTANEOUS
  Administered 2018-12-13 (×2): 2 [IU] via SUBCUTANEOUS
  Administered 2018-12-13: 21 [IU] via SUBCUTANEOUS
  Administered 2018-12-14 – 2018-12-15 (×2): 2 [IU] via SUBCUTANEOUS

## 2018-12-12 MED ORDER — ENOXAPARIN SODIUM 40 MG/0.4ML ~~LOC~~ SOLN
40.0000 mg | Freq: Every day | SUBCUTANEOUS | Status: AC
  Administered 2018-12-12 – 2018-12-16 (×5): 40 mg via SUBCUTANEOUS
  Filled 2018-12-12 (×5): qty 0.4

## 2018-12-12 MED ORDER — FUROSEMIDE 10 MG/ML IJ SOLN
40.0000 mg | Freq: Once | INTRAMUSCULAR | Status: AC
  Administered 2018-12-12: 40 mg via INTRAVENOUS
  Filled 2018-12-12: qty 4

## 2018-12-12 MED ORDER — CITALOPRAM HYDROBROMIDE 20 MG PO TABS
20.0000 mg | ORAL_TABLET | Freq: Every day | ORAL | Status: DC
  Administered 2018-12-13 – 2018-12-17 (×5): 20 mg via ORAL
  Filled 2018-12-12 (×6): qty 1

## 2018-12-12 MED FILL — Sodium Bicarbonate IV Soln 8.4%: INTRAVENOUS | Qty: 100 | Status: AC

## 2018-12-12 MED FILL — Electrolyte-R (PH 7.4) Solution: INTRAVENOUS | Qty: 3000 | Status: AC

## 2018-12-12 MED FILL — Sodium Bicarbonate IV Soln 8.4%: INTRAVENOUS | Qty: 50 | Status: AC

## 2018-12-12 MED FILL — Sodium Chloride IV Soln 0.9%: INTRAVENOUS | Qty: 2000 | Status: AC

## 2018-12-12 MED FILL — Heparin Sodium (Porcine) Inj 1000 Unit/ML: INTRAMUSCULAR | Qty: 10 | Status: AC

## 2018-12-12 MED FILL — Mannitol IV Soln 20%: INTRAVENOUS | Qty: 500 | Status: AC

## 2018-12-12 NOTE — Progress Notes (Signed)
Patient ID: Tina Dominguez, female   DOB: [birth year], 79 y.o.   MRN: [id number] EVENING ROUNDS NOTE :     [address]       [place] [address]             [phone]                 1 Day Post-Op Procedure(s) (LRB): CORONARY ARTERY BYPASS GRAFTING (CABG) x three , using left internal mammary artery, and right leg greater saphenous vein harvested endoscopically - SVG to OM1, SVG to Distal RCA (N/A) TRANSESOPHAGEAL ECHOCARDIOGRAM (TEE) (N/A)  Total Length of Stay:  LOS: 2 days  BP 123/62   Pulse 80   Temp 98.7 F (37.1 C) (Oral)   Resp 14   Ht 5\' 3"  (1.6 m)   Wt 75.6 kg   SpO2 94%   BMI 29.52 kg/m   .Intake/Output      03/18 0701 - 03/19 0700   I.V. (mL/kg) 135.3 (1.8)   Blood    IV Piggyback 100.1   Total Intake(mL/kg) 235.4 (3.1)   Urine (mL/kg/hr) 330 (0.4)   Emesis/NG output    Blood    Chest Tube 130   Total Output 460   Net -224.6         . sodium chloride Stopped (12/11/18 2010)  . sodium chloride 250 mL (12/12/18 0505)  . sodium chloride 20 mL/hr at 12/11/18 1300  . cefUROXime (ZINACEF)  IV Stopped (12/12/18 1623)  . dexmedetomidine (PRECEDEX) IV infusion 0.2 mcg/kg/hr (12/11/18 1600)  . lactated ringers    . lactated ringers    . lactated ringers Stopped (12/12/18 1136)  . nitroGLYCERIN Stopped (12/11/18 1303)  . phenylephrine (NEO-SYNEPHRINE) Adult infusion Stopped (12/12/18 1610)     Lab Results  Component Value Date   WBC 15.8 (H) 12/12/2018   HGB 9.4 (L) 12/12/2018   HCT 30.3 (L) 12/12/2018   PLT 215 12/12/2018   GLUCOSE 138 (H) 12/12/2018   ALT 11 12/10/2018   AST 19 12/10/2018   NA 137 12/12/2018   K 4.0 12/12/2018   CL 108 12/12/2018   CREATININE 1.22 (H) 12/12/2018   BUN 17 12/12/2018   CO2 20 (L) 12/12/2018   INR 1.4 (H) 12/11/2018   HGBA1C 5.5 12/10/2018    Slow mobilizing  Grace Isaac MD  [facility] [phone] Office [phone] 12/12/2018 7:16 PM

## 2018-12-12 NOTE — Discharge Summary (Addendum)
Physician Discharge Summary       [address]       [place] [address]             [phone]    Patient ID: Tina Dominguez MRN: [id number] DOB/AGE: 1941/01/21 78 y.o.  Admit date: 12/10/2018 Discharge date: 12/18/2018  Admission Diagnoses: CAD (coronary artery disease)  Discharge Diagnoses:  1.  S/P CABG x 3 2.  ABL anemia 3. Post op a fib 4. History of Clotting disorder (HCC)-s/p IVC filter  5. History of Hypertension 6. History of Hyperlipidemia 7. History of of recurrent TIAs and stroke x 4 ([place]) 8. History of of DVT (deep vein thrombosis) 9. History of GERD (gastroesophageal reflux disease) 10. History of Anxiety and Depression 11. History of Schatzki's ring 12. History of 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[place]) 13. History of Osteoporosis 14. History of Osteoarthritis 15. History of right-sided carotid endarterectomy 16. History of hiatal hernia   Procedures:   LEFT HEART CATH AND CORONARY ANGIOGRAPHY  Conclusion     Ost LM to Mid LM lesion is 60% stenosed.  Ost RCA to Prox RCA lesion is 80% stenosed.  Mid RCA lesion is 99% stenosed.  Prox LAD lesion is 90% stenosed.  There is no aortic valve stenosis.  The left ventricular systolic function is normal.  LV end diastolic pressure is normal.  The left ventricular ejection fraction is 55-65% by visual estimate.  Tortuosity in the right subclavian made catheter engagement difficult.  Would not use right radial approach.   Severe multivessel, calcific CAD.  Patient with rest pain at home.  Only received 1 dose of PLavix, 75 mg this AM since she was off of her Xarelto.  THis is not a chronic medicine for her so she would not have therapeutic antiplatelet inhibition.   Start IV heparin.  Cardiac surgery consult.     TRANSESOPHAGEAL ECHOCARDIOGRAM (TEE), MEDIAN STERNOTOMY for CORONARY ARTERY BYPASS GRAFTING (CABG) x 3 (LIMA to LAD, SVG to OM1, SVG to DISTAL RCA)  using left internal  mammary artery, and right leg greater saphenous vein harvested endoscopically by Dr. Servando Snare on 12/11/2018  History of Presenting Illness: This is a 78 year old female with a history of hypertension, dyslipidemia, deep venous thrombosis with pulmonary embolism at least twice after each of her knee replacements.  She is on chronic anticoagulation with rivaroxaban.  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  She eventually underwent right carotid artery endarterectomy in 2012.  She has had no further neurologic events since that procedure but reports recurrent stenosis of "around 60%" based on her last carotid ultrasound..  She also has a history of stage III chronic kidney disease.  She had an episode of prolonged angina about a month ago that lasted about 6 hours.  She was encouraged by her family to go to emergency room but declined.  She eventually sought medical attention in the outpatient setting by Dr. Shirlee More.  He recommended a nuclear stress test.  This was performed on 12/04/2018 and demonstrated a moderate to severe ischemic defect in the basal inferior, mid inferior, and apical inferior left ventricle. After review of study, Dr. Bettina Gavia recommended proceeding with left heart catheterization and the study was performed electively earlier today.  Coronary angiography revealed a proximal left main stenosis of 60%.  There was 90% proximal LAD stenosis and a 99% stenosis of the mid right coronary artery.  Circumflex system was spared of any significant obstructive disease.  Ejection fraction was estimated at 55 to 65%.  We have been asked to evaluate [name] for consideration of  surgical coronary revascularization. As mentioned above, [name] takes rivaroxaban chronically for history of venous thromboses and history of multiple pulmonary emboli.  Her last dose was 12/07/2018.  She also received a single dose of Plavix 75 mg this morning prior to the left heart  catheterization.  A heparin infusion has been ordered post-cath. Currently, [name] is resting in bed in the Cath Lab recovery area.  Her son is at the bedside.  He denies having any chest pain presently nor has she had any similar to the 6-hour episode that occurred a month ago.  She was prescribed nitroglycerin by Dr. Bettina Gavia few weeks ago but has not used any. 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 Shirlee More in [place] for evaluation of a prolonged episode of chest pain.  She subsequently had a positive nuclear stress test and went on to have left heart catheterization earlier today.  She has a 60% left main coronary artery stenosis as well as high-grade lesions involving the LAD and right coronary system.  Ejection fraction is well-preserved at 55 to 60%. She had no aortic stenosis.  Coronary bypass grafting is indicated for survival benefit and for control of her recurrent angina. Potential risks, benefits, and complications of the surgery were discussed with the patient and she agreed to proceed with surgery. Pre operative carotid duplex US showed 60-79% right internal carotid artery stenosis and 40-59% left internal carotid artery stenosis.   Brief Hospital Course:  The patient was extubated the afternoon of surgery without difficulty. She remained afebrile and hemodynamically stable. She was weaned off Neo Synephrine drip. Gordy Councilman, a line, chest tubes, and foley were removed early in the post operative course. Lopressor was started and titrated accordingly. She did have brief a fib and was given IV Amiodarone. She converted to sinus rhythm and was put on oral Amiodarone. She was volume over loaded and diuresed. She had ABL anemia. She did not require a post op transfusion. Last H and H was 9.1 and 29.Marland Kitchen She was weaned off the insulin drip.  The patient's HGA1C pre op was 5.5. The patient was felt surgically stable for transfer from the  ICU to [facility] for further convalescence on 03/21. She continues to progress with cardiac rehab. She was ambulating on room air. She has been tolerating a diet and has had a bowel movement. She went back into a fib with RVR on 03/23 and again was given IV Amiodarone. She quickly converted to sinus rhythm.  She was resumed on low dose Amiodarone. Rivaroxaban was restarted on 03/23 (has history of recurrent PEs) and ecasa was decreased to 81 mg daily.She has had no further a fib. Epicardial pacing wires were removed on 03/23. Potassium this am was up to 5.2 but will be re checked later this am. It was found to be 4.3 on re check. She has also been give Lasix 40 mg today and per Dr. Servando Snare, is to have 5 more days of Lasix 20 mg daily. Chest tube sutures will be removed the day of discharge. As discussed with Dr. Servando Snare, the patient is felt surgically stable for discharge today.   Latest Vital Signs: Blood pressure 128/64, pulse 62, temperature 98.2 F (36.8 C), temperature source Oral, resp. rate 14, height 5\' 3"  (1.6 m), weight 70.4 kg, SpO2 94 %.  Physical Exam: Cardiovascular: RRR  Pulmonary: Slightly diminished at bases Abdomen: Soft, non tender, bowel sounds present. Extremities: No lower extremity edema. Wounds: Clean and dry.  No erythema or signs of infection.   Discharge Condition: Stable and discharged to home.  Recent laboratory studies:  Lab Results  Component Value Date   WBC 8.8 12/17/2018   HGB 9.1 (L) 12/17/2018   HCT 29.0 (L) 12/17/2018   MCV 90.3 12/17/2018   PLT 351 12/17/2018   Lab Results  Component Value Date   NA 137 12/18/2018   K 4.3 12/18/2018   CL 107 12/18/2018   CO2 25 12/18/2018   CREATININE 0.85 12/18/2018   GLUCOSE 106 (H) 12/18/2018     Diagnostic Studies: Dg Chest 2 View  Result Date: 12/16/2018 CLINICAL DATA:  78 year old female with history of CABG. EXAM: CHEST - 2 VIEW COMPARISON:  Chest x-ray 12/15/2018. FINDINGS: Previously noted right IJ  catheter has been removed. Patient is severely rotated to the right limiting the diagnostic sensitivity and specificity of today's examination. With these limitations in mind there is no definite consolidative airspace disease. Small bilateral pleural effusions with associated areas of subsegmental atelectasis are noted in the lung bases bilaterally. No evidence of pulmonary edema. Heart size appears borderline enlarged. The patient is rotated to the right on today's exam, resulting in distortion of the mediastinal contours. Aortic atherosclerosis. Status post median sternotomy for CABG including LIMA to the LAD. IMPRESSION: 1. Support apparatus and postoperative changes, as above. 2. Small bilateral pleural effusions with areas of subsegmental atelectasis in the lung bases bilaterally. Electronically Signed   By: Vinnie Langton M.D.   On: 12/16/2018 06:29   Vas [place] Lower Extremity Saphenous Vein Mapping  Result Date: 12/12/2018 LOWER EXTREMITY VEIN MAPPING Indications: Pre-op  Performing Technologist: Maudry Mayhew MHA, RDMS, RVT, RDCS  Examination Guidelines: A complete evaluation includes B-mode imaging, spectral Doppler, color Doppler, and power Doppler as needed of all accessible portions of each vessel. Bilateral testing is considered an integral part of a complete examination. Limited examinations for reoccurring indications may be performed as noted. +--------------+-------------+--------------------+-------------+--------------+   RT Diameter    RT Findings          GSV           LT Diameter   LT Findings          (cm)                                             (cm)                      +--------------+-------------+--------------------+-------------+--------------+       0.75                       Saphenofemoral        0.52                                                          Junction                                      +--------------+-------------+--------------------+-------------+--------------+  0.50                       Proximal thigh        0.44        branching     +--------------+-------------+--------------------+-------------+--------------+       0.36        branching        Mid thigh           0.33        branching     +--------------+-------------+--------------------+-------------+--------------+       0.18                        Distal thigh         0.39                      +--------------+-------------+--------------------+-------------+--------------+       0.18                            Knee             0.32                      +--------------+-------------+--------------------+-------------+--------------+       0.16        branching        Prox calf           0.18                      +--------------+-------------+--------------------+-------------+--------------+       0.12                          Mid calf           0.27                      +--------------+-------------+--------------------+-------------+--------------+       0.14                        Distal calf          0.16                      +--------------+-------------+--------------------+-------------+--------------+                      not             Ankle                       not visualized                   visualized                                                      +--------------+-------------+--------------------+-------------+--------------+ +----------------+--------------+--------------+----------------+--------------+  RT diameter (cm)  RT Findings        SSV       LT Diameter (cm)  LT Findings    +----------------+--------------+--------------+----------------+--------------+        0.18  Popliteal                     not visualized                                       fossa                                       +----------------+--------------+--------------+----------------+--------------+                    not visualized Proximal calf                   not visualized  +----------------+--------------+--------------+----------------+--------------+                   not visualized    Mid calf                     not visualized  +----------------+--------------+--------------+----------------+--------------+                   not visualized  Distal calf                    not visualized  +----------------+--------------+--------------+----------------+--------------+ Diagnosing physician: Curt Jews MD Electronically signed by Curt Jews MD on 12/12/2018 at 5:03:06 PM.    Final    Vas US Doppler Pre Cabg  Result Date: 12/12/2018 PREOPERATIVE VASCULAR EVALUATION  Indications:  Pre-surgical evaluation. Risk Factors: Coronary artery disease. Performing Technologist: Maudry Mayhew MHA, RVT, RDCS, RDMS  Examination Guidelines: A complete evaluation includes B-mode imaging, spectral Doppler, color Doppler, and power Doppler as needed of all accessible portions of each vessel. Bilateral testing is considered an integral part of a complete examination. Limited examinations for reoccurring indications may be performed as noted.  Right Carotid Findings: +----------+--------+--------+--------+-------------------------+--------+             PSV cm/s EDV cm/s Stenosis Describe                  Comments  +----------+--------+--------+--------+-------------------------+--------+  CCA Prox   93       16                heterogenous and calcific           +----------+--------+--------+--------+-------------------------+--------+  CCA Distal 91       17                smooth and heterogenous             +----------+--------+--------+--------+-------------------------+--------+  ICA Prox   218      49                calcific                            +----------+--------+--------+--------+-------------------------+--------+  ICA Distal 90       24                                                     +----------+--------+--------+--------+-------------------------+--------+  ECA        87                                                             +----------+--------+--------+--------+-------------------------+--------+  Portions of this table do not appear on this page. +----------+--------+-------+----------------+------------+             PSV cm/s EDV cms Describe         Arm Pressure  +----------+--------+-------+----------------+------------+  Subclavian 211              Multiphasic, WNL 164           +----------+--------+-------+----------------+------------+ +---------+--------+--+--------+-+---------+  Vertebral PSV cm/s 10 EDV cm/s 4 Antegrade  +---------+--------+--+--------+-+---------+ Left Carotid Findings: +----------+-------+-------+--------+---------------------------------+--------+             PSV     EDV     Stenosis Describe                          Comments              cm/s    cm/s                                                         +----------+-------+-------+--------+---------------------------------+--------+  CCA Prox   159     27                                                           +----------+-------+-------+--------+---------------------------------+--------+  CCA Distal 138     26               heterogenous, calcific and                                                       irregular                                   +----------+-------+-------+--------+---------------------------------+--------+  ICA Prox   171     37               irregular, heterogenous and                                                      calcific                                    +----------+-------+-------+--------+---------------------------------+--------+  ICA Distal 105     23                                                           +----------+-------+-------+--------+---------------------------------+--------+  ECA        128     24  smooth and heterogenous                      +----------+-------+-------+--------+---------------------------------+--------+ +----------+--------+--------+----------------+------------+  Subclavian PSV cm/s EDV cm/s Describe         Arm Pressure  +----------+--------+--------+----------------+------------+             201               Multiphasic, WNL 145           +----------+--------+--------+----------------+------------+ +---------+--------+---+--------+--+---------+  Vertebral PSV cm/s 106 EDV cm/s 27 Antegrade  +---------+--------+---+--------+--+---------+  ABI Findings: +--------+------------------+-----+---------+--------+  Right    Rt Pressure (mmHg) Index Waveform  Comment   +--------+------------------+-----+---------+--------+  Brachial 164                      triphasic           +--------+------------------+-----+---------+--------+  PTA      204                1.24  triphasic           +--------+------------------+-----+---------+--------+  DP       172                1.05  biphasic            +--------+------------------+-----+---------+--------+ +--------+------------------+-----+---------+-------+  Left     Lt Pressure (mmHg) Index Waveform  Comment  +--------+------------------+-----+---------+-------+  Brachial 145                      triphasic          +--------+------------------+-----+---------+-------+  PTA      212                1.29  biphasic           +--------+------------------+-----+---------+-------+  DP       155                0.95  biphasic           +--------+------------------+-----+---------+-------+  Right Doppler Findings: +-----------+--------+-----+---------+----------------------------------------+  Site        Pressure Index Doppler   Comments                                  +-----------+--------+-----+---------+----------------------------------------+  Brachial    164            triphasic                                           +-----------+--------+-----+---------+----------------------------------------+   Radial                     triphasic                                           +-----------+--------+-----+---------+----------------------------------------+  Ulnar                      triphasic                                           +-----------+--------+-----+---------+----------------------------------------+  Palmar Arch                          Not obtained due to recent                                                      catheterization                           +-----------+--------+-----+---------+----------------------------------------+  Left Doppler Findings: +-----------+--------+-----+---------+--------------------+  Site        Pressure Index Doppler   Comments              +-----------+--------+-----+---------+--------------------+  Brachial    145            triphasic                       +-----------+--------+-----+---------+--------------------+  Radial                     triphasic                       +-----------+--------+-----+---------+--------------------+  Ulnar                      triphasic                       +-----------+--------+-----+---------+--------------------+  Palmar Arch                          Within normal limits  +-----------+--------+-----+---------+--------------------+  Summary: Right Carotid: Velocities in the right ICA are consistent with a low-range                60-79% stenosis. Left Carotid: Velocities in the left ICA are consistent with a low-range 40-59%               stenosis. Vertebrals:  Right vertebral artery exhibits atypical flow. Left vertebral              artery demonstrates antegrade flow. Subclavians: Normal flow hemodynamics were seen in bilateral subclavian              arteries.  Electronically signed by Curt Jews MD on 12/12/2018 at 5:03:16 PM.    Final     Discharge Medications: Allergies as of 12/18/2018   No Known Allergies     Medication List    STOP taking these medications   cloNIDine 0.1 MG tablet Commonly known as:   Catapres   nitroGLYCERIN 0.4 MG SL tablet Commonly known as:  NITROSTAT   pravastatin 40 MG tablet Commonly known as:  PRAVACHOL     TAKE these medications   acetaminophen 500 MG tablet Commonly known as:  TYLENOL Take 500-1,000 mg by mouth every 6 (six) hours as needed for moderate pain.   amiodarone 200 MG tablet Commonly known as:  PACERONE Take 1 tablet (200 mg total) by mouth 2 (two) times daily. For one week then take 200 mg by mouth daily thereafter   amLODipine 5 MG tablet Commonly known as:  NORVASC Take 1 tablet (5 mg total) by mouth daily. What changed:  when to take this   aspirin 81 MG  EC tablet Take 1 tablet (81 mg total) by mouth daily.   atorvastatin 40 MG tablet Commonly known as:  LIPITOR Take 1 tablet (40 mg total) by mouth daily at 6 PM.   citalopram 10 MG tablet Commonly known as:  CELEXA Take 10 mg by mouth at bedtime. Take with 40 mg to equal 50 mg at bedtime   citalopram 40 MG tablet Commonly known as:  CELEXA Take 40 mg by mouth daily. Take with 10 mg to equal 50 mg at bedtime   furosemide 20 MG tablet Commonly known as:  Lasix Take 1 tablet (20 mg total) by mouth daily. For 5 days then stop   levothyroxine 125 MCG tablet Commonly known as:  SYNTHROID, LEVOTHROID Take 125 mcg by mouth at bedtime.   Linzess 145 MCG Caps capsule Generic drug:  linaclotide Take 145 mcg by mouth daily before breakfast.   lisinopril 2.5 MG tablet Commonly known as:  PRINIVIL,ZESTRIL Take 1 tablet (2.5 mg total) by mouth daily.   metoprolol succinate 25 MG 24 hr tablet Commonly known as:  TOPROL-XL Take 1 tablet (25 mg total) by mouth daily. What changed:  See the new instructions.   mirtazapine 30 MG tablet Commonly known as:  REMERON Take 30 mg by mouth at bedtime.   multivitamin tablet Take 1 tablet by mouth at bedtime.   Oxycodone HCl 10 MG Tabs Take 10 mg by mouth every 6 (six) hours as needed (pain).   pantoprazole 40 MG tablet Commonly  known as:  PROTONIX TAKE 1 TABLET(40 MG) BY MOUTH TWICE DAILY   Xarelto 20 MG Tabs tablet Generic drug:  rivaroxaban Take 20 mg by mouth daily.      The patient has been discharged on:   1.Beta Blocker:  Yes [ x  ]                              No   [   ]                              If No, reason:  2.Ace Inhibitor/ARB: Yes [  x ]                                     No  [    ]                                     If No, reason:  3.Statin:   Yes [  x ]                  No  [   ]                  If No, reason:  4.Ecasa:  Yes  [ x  ]                  No   [   ]                  If No, reason:  Follow Up Appointments: Follow-up Information    Richardo Priest, MD. Go on 12/28/2018.   Specialty:  Cardiology Why:  Appointment time is at 8:20 am  Contact information: [address] [place] [address] [phone]        Grace Isaac, MD. Go on 01/14/2019.   Specialty:  Cardiothoracic Surgery Why:  PA/LAT CXR to be taken (at [facility] which is in the same building as Dr. Barb Merino 04/20 at 1:00 pm;Appointment time is 1:30 pm Contact information: [place] [address] [phone]           Signed: Sharalyn Ink [facility] 12/18/2018, 11:30 AM

## 2018-12-12 NOTE — Progress Notes (Addendum)
[facility] DAILY ICU PROGRESS NOTE                   [address]            [place] [address]          [phone]   1 Day Post-Op Procedure(s) (LRB): CORONARY ARTERY BYPASS GRAFTING (CABG) x three , using left internal mammary artery, and right leg greater saphenous vein harvested endoscopically - SVG to OM1, SVG to Distal RCA (N/A) TRANSESOPHAGEAL ECHOCARDIOGRAM (TEE) (N/A)  Total Length of Stay:  LOS: 2 days   Subjective: Some incisional pain  Objective: Vital signs in last 24 hours: Temp:  [96.4 F (35.8 C)-99.7 F (37.6 C)] 99 F (37.2 C) (03/18 0700) Pulse Rate:  [78-83] 80 (03/18 0700) Cardiac Rhythm: Atrial paced (03/18 0000) Resp:  [12-36] 13 (03/18 0700) BP: (93-153)/(54-83) 103/69 (03/18 0700) SpO2:  [88 %-100 %] 95 % (03/18 0700) Arterial Line BP: (88-140)/(39-66) 95/42 (03/18 0700) FiO2 (%):  [40 %-50 %] 40 % (03/17 1516) Weight:  [75.6 kg] 75.6 kg (03/18 0500)  Filed Weights   12/10/18 1415 12/11/18 0522 12/12/18 0500  Weight: 72.6 kg 71.8 kg 75.6 kg    Weight change: 3.024 kg   Hemodynamic parameters for last 24 hours: PAP: (19-32)/(1-14) 27/14 CO:  [2.2 L/min-4.1 L/min] 4.1 L/min CI:  [1.3 L/min/m2-2.4 L/min/m2] 2.4 L/min/m2  Intake/Output from previous day: 03/17 0701 - 03/18 0700 In: 0354.6 [I.V.:5081.2; Blood:450; IV Piggyback:1134.9] Out: 3597 [Urine:2275; Blood:900; Chest Tube:422]  Intake/Output this shift: No intake/output data recorded.  Current Meds: Scheduled Meds: . acetaminophen  1,000 mg Oral Q6H   Or  . acetaminophen (TYLENOL) oral liquid 160 mg/5 mL  1,000 mg Per Tube Q6H  . aspirin EC  325 mg Oral Daily   Or  . aspirin  324 mg Per Tube Daily  . bisacodyl  10 mg Oral Daily   Or  . bisacodyl  10 mg Rectal Daily  . Chlorhexidine Gluconate Cloth  6 each Topical Daily  . citalopram  10 mg Oral QHS  . citalopram  40 mg Oral Daily  . docusate sodium  200 mg Oral Daily  . insulin aspart  0-24 Units Subcutaneous  Q4H  . levothyroxine  125 mcg Oral QHS  . linaclotide  145 mcg Oral QAC breakfast  . mouth rinse  15 mL Mouth Rinse BID  . metoprolol tartrate  12.5 mg Oral BID   Or  . metoprolol tartrate  12.5 mg Per Tube BID  . mirtazapine  30 mg Oral QHS  . [START ON 12/13/2018] pantoprazole  40 mg Oral Daily  . pravastatin  40 mg Oral QHS  . sodium chloride flush  10-40 mL Intracatheter Q12H   Continuous Infusions: . sodium chloride Stopped (12/11/18 2010)  . sodium chloride 250 mL (12/12/18 0505)  . sodium chloride 20 mL/hr at 12/11/18 1300  . albumin human 12.5 g (12/12/18 0514)  . cefUROXime (ZINACEF)  IV Stopped (12/12/18 0543)  . dexmedetomidine (PRECEDEX) IV infusion 0.2 mcg/kg/hr (12/11/18 1600)  . lactated ringers    . lactated ringers    . lactated ringers 150 mL/hr at 12/11/18 1301  . nitroGLYCERIN Stopped (12/11/18 1303)  . phenylephrine (NEO-SYNEPHRINE) Adult infusion 10 mcg/min (12/11/18 1301)   PRN Meds:.sodium chloride, albumin human, lactated ringers, metoprolol tartrate, midazolam, morphine injection, ondansetron (ZOFRAN) IV, oxyCODONE, sodium chloride flush, traMADol  General appearance: alert, cooperative and no distress Neurologic: intact Heart: regular rate and rhythm Lungs: clear  anteriorly Abdomen: soft, nontender Extremities: min edema Wound: dressings CDI  Lab Results: CBC: Recent Labs    12/11/18 1827 12/11/18 1828 12/12/18 0511  WBC 17.6*  --  17.0*  HGB 10.9* 10.2* 10.3*  HCT 33.8* 30.0* 30.6*  PLT 228  --  249   BMET:  Recent Labs    12/11/18 0034 12/11/18 1314  12/11/18 1827 12/11/18 1828 12/12/18 0511  NA 140 142   < >  --  138 137  K 3.2* 3.1*   < >  --  4.1 3.9  CL 111  --   --   --   --  111  CO2 20*  --   --   --   --  22  GLUCOSE 105* 86  --   --   --  145*  BUN 13  --   --   --   --  13  CREATININE 0.80  --   --  0.83  --  0.99  CALCIUM 8.7*  --   --   --   --  7.8*   < > = values in this interval not displayed.    CMET: Lab  Results  Component Value Date   WBC 17.0 (H) 12/12/2018   HGB 10.3 (L) 12/12/2018   HCT 30.6 (L) 12/12/2018   PLT 249 12/12/2018   GLUCOSE 145 (H) 12/12/2018   ALT 11 12/10/2018   AST 19 12/10/2018   NA 137 12/12/2018   K 3.9 12/12/2018   CL 111 12/12/2018   CREATININE 0.99 12/12/2018   BUN 13 12/12/2018   CO2 22 12/12/2018   INR 1.4 (H) 12/11/2018   HGBA1C 5.5 12/10/2018      PT/INR:  Recent Labs    12/11/18 1310  LABPROT 16.7*  INR 1.4*   Radiology: Dg Chest Port 1 View  Result Date: 12/12/2018 CLINICAL DATA:  78 year old female postoperative day 1 status post CABG. EXAM: PORTABLE CHEST 1 VIEW COMPARISON:  12/11/2018 and earlier. FINDINGS: Portable AP semi upright view at 0540 hours. Extubated. Enteric tube removed. Left chest tube remains in place. Possible trace left pneumothorax in the apex. Right IJ approach Swan-Ganz catheter position is not significantly changed. Mediastinal tube remains in place. Stable lung volumes and ventilation. No pulmonary edema. No pleural effusion or confluent opacity. Stable cardiac size and mediastinal contours. Negative visible bowel gas pattern. IMPRESSION: 1. Extubated and enteric tube removed. Otherwise stable lines and tubes. 2. Possible trace left apical pneumothorax. No other acute cardiopulmonary abnormality. Electronically Signed   By: Genevie Ann M.D.   On: 12/12/2018 07:41   Dg Chest Port 1 View  Result Date: 12/11/2018 CLINICAL DATA:  CABG. EXAM: PORTABLE CHEST 1 VIEW COMPARISON:  Chest x-ray dated December 05, 2018. FINDINGS: Endotracheal tube in position with the tip 5.1 cm above the level of the carina. Enteric tube in the stomach. Right internal jugular Swan-Ganz catheter with the tip in the main pulmonary outflow tract. Mediastinal and left chest tubes are in good position. Interval CABG. Normal heart size. Low lung volumes with bronchovascular crowding. Minimal bibasilar atelectasis. No pleural effusion or pneumothorax. No acute osseous  abnormality. IMPRESSION: 1. Interval CABG with appropriately positioned support apparatus. 2. Low lung volumes with minimal bibasilar atelectasis. Electronically Signed   By: Titus Dubin M.D.   On: 12/11/2018 13:44     Assessment/Plan: S/P Procedure(s) (LRB): CORONARY ARTERY BYPASS GRAFTING (CABG) x three , using left internal mammary artery, and right leg greater saphenous vein harvested  endoscopically - SVG to OM1, SVG to Distal RCA (N/A) TRANSESOPHAGEAL ECHOCARDIOGRAM (TEE) (N/A)  1 stable POD#1 2 hemodyn stable on low dose neo, a paced, underlying brady in 50's 3 afebrile , stable leukocytosis with WBC 17, prob systemic inflam response 4 Expected Acute  Blood - loss Anemia- continue to monitor and preop anemia 5 good UOP with stable renal fnxn- hold on diuretic currently 6 d/c lines per routine when neo off, d/c MT's leave pleural tube for now, leave foley for now  7 routine pulm toilet and rehab 8 BS adeq control, no DM< HgA1C 5.5, SSI for now 9 see progression orders  John Giovanni PA-C 12/12/2018 7:53 AM  Pager [phone]  I have seen and examined Lyda Kalata and agree with the above assessment  and plan.  Grace Isaac MD Beeper [phone] Office [phone] 12/12/2018 8:51 AM

## 2018-12-13 ENCOUNTER — Inpatient Hospital Stay (HOSPITAL_COMMUNITY): Payer: PPO

## 2018-12-13 DIAGNOSIS — I200 Unstable angina: Secondary | ICD-10-CM

## 2018-12-13 LAB — GLUCOSE, CAPILLARY
GLUCOSE-CAPILLARY: 132 mg/dL — AB (ref 70–99)
Glucose-Capillary: 136 mg/dL — ABNORMAL HIGH (ref 70–99)
Glucose-Capillary: 87 mg/dL (ref 70–99)
Glucose-Capillary: 122 mg/dL — ABNORMAL HIGH (ref 70–99)
Glucose-Capillary: 102 mg/dL — ABNORMAL HIGH (ref 70–99)
Glucose-Capillary: 126 mg/dL — ABNORMAL HIGH (ref 70–99)

## 2018-12-13 LAB — BASIC METABOLIC PANEL
ANION GAP: 11 (ref 5–15)
BUN: 18 mg/dL (ref 8–23)
CO2: 21 mmol/L — ABNORMAL LOW (ref 22–32)
Calcium: 9 mg/dL (ref 8.9–10.3)
Chloride: 104 mmol/L (ref 98–111)
Creatinine, Ser: 1.16 mg/dL — ABNORMAL HIGH (ref 0.44–1.00)
GFR calc non Af Amer: 46 mL/min — ABNORMAL LOW (ref >60)
GFR, EST AFRICAN AMERICAN: 53 mL/min — AB (ref >60)
GLUCOSE: 122 mg/dL — AB (ref 70–99)
Potassium: 3.8 mmol/L (ref 3.5–5.1)
SODIUM: 136 mmol/L (ref 135–145)

## 2018-12-13 LAB — MAGNESIUM: Magnesium: 1.8 mg/dL (ref 1.7–2.4)

## 2018-12-13 LAB — TSH: TSH: 0.178 u[IU]/mL — ABNORMAL LOW (ref 0.350–4.500)

## 2018-12-13 LAB — CBC
HCT: 28.3 % — ABNORMAL LOW (ref 36.0–46.0)
Hemoglobin: 9.2 g/dL — ABNORMAL LOW (ref 12.0–15.0)
MCH: 30.3 pg (ref 26.0–34.0)
MCHC: 32.5 g/dL (ref 30.0–36.0)
MCV: 93.1 fL (ref 80.0–100.0)
PLATELETS: 200 10*3/uL (ref 150–400)
RBC: 3.04 MIL/uL — ABNORMAL LOW (ref 3.87–5.11)
RDW: 13.7 % (ref 11.5–15.5)
WBC: 16.2 10*3/uL — ABNORMAL HIGH (ref 4.0–10.5)
nRBC: 0 % (ref 0.0–0.2)

## 2018-12-13 MED ORDER — AMIODARONE HCL 200 MG PO TABS
200.0000 mg | ORAL_TABLET | Freq: Two times a day (BID) | ORAL | Status: DC
  Administered 2018-12-14 – 2018-12-16 (×6): 200 mg via ORAL
  Filled 2018-12-13 (×7): qty 1

## 2018-12-13 MED ORDER — AMIODARONE HCL 200 MG PO TABS: 200.0000 mg | ORAL_TABLET | Freq: Every day | ORAL | Status: DC

## 2018-12-13 MED ORDER — FUROSEMIDE 10 MG/ML IJ SOLN
40.0000 mg | Freq: Once | INTRAMUSCULAR | Status: AC
  Administered 2018-12-13: 40 mg via INTRAVENOUS
  Filled 2018-12-13: qty 4

## 2018-12-13 MED ORDER — AMIODARONE HCL IN DEXTROSE 360-4.14 MG/200ML-% IV SOLN
30.0000 mg/h | INTRAVENOUS | Status: DC
  Filled 2018-12-13 (×4): qty 200

## 2018-12-13 MED ORDER — AMIODARONE LOAD VIA INFUSION
150.0000 mg | Freq: Once | INTRAVENOUS | Status: DC
  Filled 2018-12-13: qty 83.34

## 2018-12-13 MED ORDER — FOLIC ACID 1 MG PO TABS
1.0000 mg | ORAL_TABLET | Freq: Every day | ORAL | Status: DC
  Administered 2018-12-13 – 2018-12-18 (×6): 1 mg via ORAL
  Filled 2018-12-13 (×6): qty 1

## 2018-12-13 MED ORDER — AMIODARONE HCL IN DEXTROSE 360-4.14 MG/200ML-% IV SOLN
60.0000 mg/h | INTRAVENOUS | Status: AC
  Administered 2018-12-13 (×2): 60 mg/h via INTRAVENOUS
  Filled 2018-12-13 (×2): qty 200

## 2018-12-13 NOTE — Progress Notes (Signed)
EVENING ROUNDS NOTE :     [address]       [place] [address]             [phone]                 2 Days Post-Op Procedure(s) (LRB): CORONARY ARTERY BYPASS GRAFTING (CABG) x three , using left internal mammary artery, and right leg greater saphenous vein harvested endoscopically - SVG to OM1, SVG to Distal RCA (N/A) TRANSESOPHAGEAL ECHOCARDIOGRAM (TEE) (N/A)  Total Length of Stay:  LOS: 3 days  BP 124/62 (BP Location: Right Arm)   Pulse 68   Temp 98.6 F (37 C) (Oral)   Resp 11   Ht 5\' 3"  (1.6 m)   Wt 76.2 kg   SpO2 97%   BMI 29.76 kg/m   .Intake/Output      03/18 0701 - 03/19 0700 03/19 0701 - 03/20 0700   P.O.  240   I.V. (mL/kg) 135.3 (1.8) 10 (0.1)   Blood     IV Piggyback 200.1    Total Intake(mL/kg) 335.4 (4.4) 250 (3.3)   Urine (mL/kg/hr) 1055 (0.6) 925 (1.1)   Emesis/NG output     Blood     Chest Tube 230    Total Output 1285 925   Net -949.6 -675          . sodium chloride Stopped (12/11/18 2010)  . sodium chloride 250 mL (12/12/18 0505)  . sodium chloride 20 mL/hr at 12/11/18 1300  . amiodarone     Followed by  . [START ON 12/14/2018] amiodarone    . dexmedetomidine (PRECEDEX) IV infusion 0.2 mcg/kg/hr (12/11/18 1600)  . lactated ringers    . lactated ringers    . lactated ringers Stopped (12/12/18 1136)  . nitroGLYCERIN Stopped (12/11/18 1303)  . phenylephrine (NEO-SYNEPHRINE) Adult infusion Stopped (12/12/18 1610)     Lab Results  Component Value Date   WBC 16.2 (H) 12/13/2018   HGB 9.2 (L) 12/13/2018   HCT 28.3 (L) 12/13/2018   PLT 200 12/13/2018   GLUCOSE 122 (H) 12/13/2018   ALT 11 12/10/2018   AST 19 12/10/2018   NA 136 12/13/2018   K 3.8 12/13/2018   CL 104 12/13/2018   CREATININE 1.16 (H) 12/13/2018   BUN 18 12/13/2018   CO2 21 (L) 12/13/2018   INR 1.4 (H) 12/11/2018   HGBA1C 5.5 12/10/2018   Start iv Cordarone  For recurrent afib     Tina Isaac MD  Beeper [phone] Office [phone]  12/13/2018 6:16 PM

## 2018-12-13 NOTE — Evaluation (Signed)
Physical Therapy Evaluation Patient Details Name: Tina Dominguez MRN: [id number] DOB: 03-05-1941 Today's Date: 12/13/2018   History of Present Illness  Pt is a 78 y.o. female s/p CABG x 3.   Clinical Impression  Pt admitted with above diagnosis. Pt currently with functional limitations due to the deficits listed below (see PT Problem List). PTA pt lived at home alone independent. On eval, she required mod assist bed mobility, min assist sit to stand and min assist ambulation 150 feet with RW. Pt on 2 L O2 throughout session with SpO2 94-96%.  Pt will benefit from skilled PT to increase their independence and safety with mobility to allow discharge to the venue listed below.       Follow Up Recommendations Home health PT;Supervision/Assistance - 24 hour    Equipment Recommendations  None recommended by PT    Recommendations for Other Services       Precautions / Restrictions Precautions Precautions: Sternal Precaution Comments: Pt educated on sternal precautions. Restrictions Weight Bearing Restrictions: Yes Other Position/Activity Restrictions: sternal precautions      Mobility  Bed Mobility Overal bed mobility: Needs Assistance Bed Mobility: Supine to Sit     Supine to sit: Mod assist;HOB elevated     General bed mobility comments: assist to elevate trunk maintaining sternal precautions  Transfers Overall transfer level: Needs assistance Equipment used: Ambulation equipment used Transfers: Sit to/from Stand Sit to Stand: Min assist         General transfer comment: assist to power up, cues to not push up with BUE  Ambulation/Gait Ambulation/Gait assistance: Min assist Gait Distance (Feet): 150 Feet Assistive device: Rolling walker (2 wheeled) Gait Pattern/deviations: Step-through pattern;Decreased stride length;Trunk flexed Gait velocity: decreased Gait velocity interpretation: <1.31 ft/sec, indicative of household ambulator General Gait Details: Pt  ambulated on 2 L O2 with SpO2 94-96%. Pt with c/o SOB limiting gait distance.  Stairs            Wheelchair Mobility    Modified Rankin (Stroke Patients Only)       Balance Overall balance assessment: Needs assistance Sitting-balance support: No upper extremity supported;Feet supported Sitting balance-Leahy Scale: Fair     Standing balance support: Bilateral upper extremity supported;During functional activity Standing balance-Leahy Scale: Fair Standing balance comment: static stand and ambulate in room without UE support                             Pertinent Vitals/Pain Pain Assessment: Faces Faces Pain Scale: Hurts little more Pain Location: sternum Pain Descriptors / Indicators: Discomfort;Grimacing;Guarding Pain Intervention(s): Monitored during session;Repositioned    Home Living Family/patient expects to be discharged to:: Private residence Living Arrangements: Alone(son lives across the street) Available Help at Discharge: Family;Available PRN/intermittently Type of Home: Mobile home Home Access: Ramped entrance     Home Layout: One level Home Equipment: Wink - 2 wheels;Cane - single point;Grab bars - tub/shower;Shower seat - built in      Prior Function Level of Independence: Independent               Journalist, newspaper        Extremity/Trunk Assessment   Upper Extremity Assessment Upper Extremity Assessment: Generalized weakness    Lower Extremity Assessment Lower Extremity Assessment: Generalized weakness    Cervical / Trunk Assessment Cervical / Trunk Assessment: Kyphotic  Communication   Communication: No difficulties  Cognition Arousal/Alertness: Awake/alert Behavior During Therapy: Flat affect Overall Cognitive Status: Within Functional Limits  for tasks assessed                                        General Comments General comments (skin integrity, edema, etc.): son present in room     Exercises     Assessment/Plan    PT Assessment Patient needs continued PT services  PT Problem List Decreased strength;Decreased balance;Pain;Decreased knowledge of precautions;Decreased mobility;Cardiopulmonary status limiting activity;Decreased activity tolerance       PT Treatment Interventions Functional mobility training;DME instruction;Balance training;Patient/family education;Gait training;Therapeutic activities;Therapeutic exercise    PT Goals (Current goals can be found in the Care Plan section)  Acute Rehab PT Goals Patient Stated Goal: feel better PT Goal Formulation: With patient/family Time For Goal Achievement: 12/27/18 Potential to Achieve Goals: Good    Frequency Min 3X/week   Barriers to discharge        Co-evaluation               AM-PAC PT "6 Clicks" Mobility  Outcome Measure Help needed turning from your back to your side while in a flat bed without using bedrails?: A Little Help needed moving from lying on your back to sitting on the side of a flat bed without using bedrails?: A Lot Help needed moving to and from a bed to a chair (including a wheelchair)?: A Little Help needed standing up from a chair using your arms (e.g., wheelchair or bedside chair)?: A Little Help needed to walk in hospital room?: A Little Help needed climbing 3-5 steps with a railing? : A Lot 6 Click Score: 16    End of Session Equipment Utilized During Treatment: Gait belt;Oxygen Activity Tolerance: Patient limited by fatigue Patient left: in chair;with call bell/phone within reach;with family/visitor present Nurse Communication: Mobility status PT Visit Diagnosis: Muscle weakness (generalized) (M62.81);Other abnormalities of gait and mobility (R26.89);Difficulty in walking, not elsewhere classified (R26.2);Pain    Time: 1111-1141 PT Time Calculation (min) (ACUTE ONLY): 30 min   Charges:   PT Evaluation $PT Eval Moderate Complexity: 1 Mod PT Treatments $Gait  Training: 8-22 mins        Lorrin Goodell, PT  Office # [phone] Pager [phone]   Lorriane Shire 12/13/2018, 12:27 PM

## 2018-12-13 NOTE — Progress Notes (Signed)
Progress Note  Patient Name: Tina Dominguez Date of Encounter: 12/13/2018  Primary Cardiologist: No primary care provider on file.   Subjective   Does not feel well.  No specific complaint of chest pain or shortness of breath.  Inpatient Medications    Scheduled Meds: . acetaminophen  1,000 mg Oral Q6H   Or  . acetaminophen (TYLENOL) oral liquid 160 mg/5 mL  1,000 mg Per Tube Q6H  . aspirin EC  325 mg Oral Daily   Or  . aspirin  324 mg Per Tube Daily  . bisacodyl  10 mg Oral Daily   Or  . bisacodyl  10 mg Rectal Daily  . Chlorhexidine Gluconate Cloth  6 each Topical Daily  . citalopram  20 mg Oral QHS  . docusate sodium  200 mg Oral Daily  . enoxaparin (LOVENOX) injection  40 mg Subcutaneous QHS  . folic acid  1 mg Oral Daily  . furosemide  40 mg Intravenous Once  . insulin aspart  0-24 Units Subcutaneous Q4H  . levothyroxine  125 mcg Oral QHS  . linaclotide  145 mcg Oral QAC breakfast  . mouth rinse  15 mL Mouth Rinse BID  . metoprolol tartrate  12.5 mg Oral BID   Or  . metoprolol tartrate  12.5 mg Per Tube BID  . mirtazapine  30 mg Oral QHS  . pantoprazole  40 mg Oral Daily  . pravastatin  40 mg Oral QHS  . sodium chloride flush  10-40 mL Intracatheter Q12H   Continuous Infusions: . sodium chloride Stopped (12/11/18 2010)  . sodium chloride 250 mL (12/12/18 0505)  . sodium chloride 20 mL/hr at 12/11/18 1300  . dexmedetomidine (PRECEDEX) IV infusion 0.2 mcg/kg/hr (12/11/18 1600)  . lactated ringers    . lactated ringers    . lactated ringers Stopped (12/12/18 1136)  . nitroGLYCERIN Stopped (12/11/18 1303)  . phenylephrine (NEO-SYNEPHRINE) Adult infusion Stopped (12/12/18 1610)   PRN Meds: sodium chloride, lactated ringers, metoprolol tartrate, midazolam, morphine injection, ondansetron (ZOFRAN) IV, oxyCODONE, sodium chloride flush, traMADol   Vital Signs    Vitals:   12/13/18 0300 12/13/18 0400 12/13/18 0500 12/13/18 0743  BP: 111/65 123/62 113/65    Pulse: 70 71 63   Resp: 12 11 10    Temp:  (!) 97.4 F (36.3 C)  98.7 F (37.1 C)  TempSrc:  Oral  Oral  SpO2: 97% 98% 97%   Weight:   76.2 kg   Height:        Intake/Output Summary (Last 24 hours) at 12/13/2018 0824 Last data filed at 12/13/2018 0600 Gross per 24 hour  Intake 305.45 ml  Output 1255 ml  Net -949.55 ml   Last 3 Weights 12/13/2018 12/12/2018 12/11/2018  Weight (lbs) 167 lb 15.9 oz 166 lb 10.7 oz 158 lb 4.6 oz  Weight (kg) 76.2 kg 75.6 kg 71.8 kg      Telemetry    This morning's telemetry review demonstrates normal sinus rhythm with occasional PVCs.  Brief episode of atrial fibrillation noted yesterday - Personally Reviewed   Physical Exam  Elderly woman sitting in chair, flat affect GEN: No acute distress.   Neck: No JVD Cardiac: RRR, no murmurs, rubs, or gallops.  Respiratory:  Diminished in the bases otherwise clear GI: Soft, nontender, non-distended  MS:  Mild bilateral pretibial edema; No deformity. Neuro:  Nonfocal  Psych: Normal affect   Labs    Chemistry Recent Labs  Lab 12/10/18 1835  12/12/18 0511 12/12/18 1606 12/13/18  0448  NA 140   < > 137 137 136  K 3.6   < > 3.9 4.0 3.8  CL 112*   < > 111 108 104  CO2 20*   < > 22 20* 21*  GLUCOSE 94   < > 145* 138* 122*  BUN 14   < > 13 17 18   CREATININE 0.79   < > 0.99 1.22* 1.16*  CALCIUM 8.8*   < > 7.8* 9.0 9.0  PROT 6.3*  --   --   --   --   ALBUMIN 3.4*  --   --   --   --   AST 19  --   --   --   --   ALT 11  --   --   --   --   ALKPHOS 70  --   --   --   --   BILITOT 0.5  --   --   --   --   GFRNONAA >60   < > 56* 43* 46*  GFRAA >60   < > >60 50* 53*  ANIONGAP 8   < > 4* 9 11   < > = values in this interval not displayed.     Hematology Recent Labs  Lab 12/12/18 0511 12/12/18 1606 12/13/18 0448  WBC 17.0* 15.8* 16.2*  RBC 3.38* 3.25* 3.04*  HGB 10.3* 9.4* 9.2*  HCT 30.6* 30.3* 28.3*  MCV 90.5 93.2 93.1  MCH 30.5 28.9 30.3  MCHC 33.7 31.0 32.5  RDW 13.7 13.8 13.7  PLT 249  215 200    Cardiac EnzymesNo results for input(s): TROPONINI in the last 168 hours. No results for input(s): TROPIPOC in the last 168 hours.   BNPNo results for input(s): BNP, PROBNP in the last 168 hours.   DDimer No results for input(s): DDIMER in the last 168 hours.   Radiology    Dg Chest Port 1 View  Result Date: 12/13/2018 CLINICAL DATA:  Chest tube. EXAM: PORTABLE CHEST 1 VIEW COMPARISON:  12/12/2018. FINDINGS: Interim removal of Swan-Ganz catheter. Right IJ line noted with tip over cavoatrial junction. Left chest tube in stable position. No pneumothorax. Prior CABG. Stable cardiomegaly. Low lung volumes with progressive bibasilar atelectasis/infiltrates. Small right pleural effusion. IMPRESSION: 1. Interim removal Swan-Ganz catheter. Right IJ line noted with tip over cavoatrial junction. Left chest tube in stable position. No pneumothorax. 2. Low lung volumes with progressive bibasilar atelectasis/infiltrates. Small right pleural effusion. Electronically Signed   By: Marcello Moores  Register   On: 12/13/2018 06:49   Dg Chest Port 1 View  Result Date: 12/12/2018 CLINICAL DATA:  78 year old female postoperative day 1 status post CABG. EXAM: PORTABLE CHEST 1 VIEW COMPARISON:  12/11/2018 and earlier. FINDINGS: Portable AP semi upright view at 0540 hours. Extubated. Enteric tube removed. Left chest tube remains in place. Possible trace left pneumothorax in the apex. Right IJ approach Swan-Ganz catheter position is not significantly changed. Mediastinal tube remains in place. Stable lung volumes and ventilation. No pulmonary edema. No pleural effusion or confluent opacity. Stable cardiac size and mediastinal contours. Negative visible bowel gas pattern. IMPRESSION: 1. Extubated and enteric tube removed. Otherwise stable lines and tubes. 2. Possible trace left apical pneumothorax. No other acute cardiopulmonary abnormality. Electronically Signed   By: Genevie Ann M.D.   On: 12/12/2018 07:41   Dg Chest Port 1  View  Result Date: 12/11/2018 CLINICAL DATA:  CABG. EXAM: PORTABLE CHEST 1 VIEW COMPARISON:  Chest x-ray dated December 05, 2018. FINDINGS: Endotracheal tube in position with the tip 5.1 cm above the level of the carina. Enteric tube in the stomach. Right internal jugular Swan-Ganz catheter with the tip in the main pulmonary outflow tract. Mediastinal and left chest tubes are in good position. Interval CABG. Normal heart size. Low lung volumes with bronchovascular crowding. Minimal bibasilar atelectasis. No pleural effusion or pneumothorax. No acute osseous abnormality. IMPRESSION: 1. Interval CABG with appropriately positioned support apparatus. 2. Low lung volumes with minimal bibasilar atelectasis. Electronically Signed   By: Titus Dubin M.D.   On: 12/11/2018 13:44    Cardiac Studies   Cardiac catheterization 12/09/2017: Conclusion     Ost LM to Mid LM lesion is 60% stenosed.  Ost RCA to Prox RCA lesion is 80% stenosed.  Mid RCA lesion is 99% stenosed.  Prox LAD lesion is 90% stenosed.  There is no aortic valve stenosis.  The left ventricular systolic function is normal.  LV end diastolic pressure is normal.  The left ventricular ejection fraction is 55-65% by visual estimate.  Tortuosity in the right subclavian made catheter engagement difficult.  Would not use right radial approach.   Severe multivessel, calcific CAD.  Patient with rest pain at home.  Only received 1 dose of PLavix, 75 mg this AM since she was off of her Xarelto.  THis is not a chronic medicine for her so she would not have therapeutic antiplatelet inhibition.   Start IV heparin.  Cardiac surgery consult.     Patient Profile     78 y.o. female with crescendo angina found to have severe multivessel coronary artery disease, now postoperative day #2 from multivessel CABG  Assessment & Plan    Coronary artery disease with progressive angina, now postoperative day #2 from CABG.  The patient appears  stable, on appropriate medical therapy with aspirin, a beta-blocker, and a statin drug.  She is receiving IV furosemide for postoperative volume overload.  Management per Dr. Servando Snare.  Heart rhythm reviewed and is stable at present in sinus rhythm.  We will follow-up as she gets closer to hospital discharge.  For questions or updates, please contact [place] [place] Please consult [email or web address] for contact info under     Signed, Sherren Mocha, MD  12/13/2018, 8:24 AM

## 2018-12-13 NOTE — Progress Notes (Addendum)
Patient ID: Tina Dominguez, female   DOB: [birth year], 78 y.o.   MRN: [id number] TCTS DAILY ICU PROGRESS NOTE                   [address]            [place] [address]          [phone]   2 Days Post-Op Procedure(s) (LRB): CORONARY ARTERY BYPASS GRAFTING (CABG) x three , using left internal mammary artery, and right leg greater saphenous vein harvested endoscopically - SVG to OM1, SVG to Distal RCA (N/A) TRANSESOPHAGEAL ECHOCARDIOGRAM (TEE) (N/A)  Total Length of Stay:  LOS: 3 days   Subjective: Very slow mobilizing, very brief afib last night now sinus   Objective: Vital signs in last 24 hours: Temp:  [97.4 F (36.3 C)-99 F (37.2 C)] 98.7 F (37.1 C) (03/19 0743) Pulse Rate:  [63-123] 63 (03/19 0500) Cardiac Rhythm: Atrial fibrillation (03/19 0400) Resp:  [10-27] 10 (03/19 0500) BP: (100-130)/(58-76) 113/65 (03/19 0500) SpO2:  [86 %-98 %] 97 % (03/19 0500) Arterial Line BP: (80-111)/(40-47) 110/41 (03/18 1615) Weight:  [76.2 kg] 76.2 kg (03/19 0500)  Filed Weights   12/11/18 0522 12/12/18 0500 12/13/18 0500  Weight: 71.8 kg 75.6 kg 76.2 kg    Weight change: 0.6 kg   Hemodynamic parameters for last 24 hours: PAP: (27-31)/(12-15) 31/15  Intake/Output from previous day: 03/18 0701 - 03/19 0700 In: 335.4 [I.V.:135.3; IV Piggyback:200.1] Out: 1285 [Urine:1055; Chest Tube:230]  Intake/Output this shift: No intake/output data recorded.  Current Meds: Scheduled Meds: . acetaminophen  1,000 mg Oral Q6H   Or  . acetaminophen (TYLENOL) oral liquid 160 mg/5 mL  1,000 mg Per Tube Q6H  . aspirin EC  325 mg Oral Daily   Or  . aspirin  324 mg Per Tube Daily  . bisacodyl  10 mg Oral Daily   Or  . bisacodyl  10 mg Rectal Daily  . Chlorhexidine Gluconate Cloth  6 each Topical Daily  . citalopram  20 mg Oral QHS  . docusate sodium  200 mg Oral Daily  . enoxaparin (LOVENOX) injection  40 mg Subcutaneous QHS  . insulin aspart  0-24 Units  Subcutaneous Q4H  . levothyroxine  125 mcg Oral QHS  . linaclotide  145 mcg Oral QAC breakfast  . mouth rinse  15 mL Mouth Rinse BID  . metoprolol tartrate  12.5 mg Oral BID   Or  . metoprolol tartrate  12.5 mg Per Tube BID  . mirtazapine  30 mg Oral QHS  . pantoprazole  40 mg Oral Daily  . pravastatin  40 mg Oral QHS  . sodium chloride flush  10-40 mL Intracatheter Q12H   Continuous Infusions: . sodium chloride Stopped (12/11/18 2010)  . sodium chloride 250 mL (12/12/18 0505)  . sodium chloride 20 mL/hr at 12/11/18 1300  . dexmedetomidine (PRECEDEX) IV infusion 0.2 mcg/kg/hr (12/11/18 1600)  . lactated ringers    . lactated ringers    . lactated ringers Stopped (12/12/18 1136)  . nitroGLYCERIN Stopped (12/11/18 1303)  . phenylephrine (NEO-SYNEPHRINE) Adult infusion Stopped (12/12/18 1610)   PRN Meds:.sodium chloride, lactated ringers, metoprolol tartrate, midazolam, morphine injection, ondansetron (ZOFRAN) IV, oxyCODONE, sodium chloride flush, traMADol  General appearance: alert, cooperative and no distress Neurologic: intact Heart: regular rate and rhythm, S1, S2 normal, no murmur, click, rub or gallop Lungs: diminished breath sounds bibasilar Abdomen: soft, non-tender; bowel sounds normal; no masses,  no organomegaly Extremities: extremities  normal, atraumatic, no cyanosis or edema Wound: dressing intact, in place sternum stable  Lab Results: CBC: Recent Labs    12/12/18 1606 12/13/18 0448  WBC 15.8* 16.2*  HGB 9.4* 9.2*  HCT 30.3* 28.3*  PLT 215 200   BMET:  Recent Labs    12/12/18 1606 12/13/18 0448  NA 137 136  K 4.0 3.8  CL 108 104  CO2 20* 21*  GLUCOSE 138* 122*  BUN 17 18  CREATININE 1.22* 1.16*  CALCIUM 9.0 9.0    CMET: Lab Results  Component Value Date   WBC 16.2 (H) 12/13/2018   HGB 9.2 (L) 12/13/2018   HCT 28.3 (L) 12/13/2018   PLT 200 12/13/2018   GLUCOSE 122 (H) 12/13/2018   ALT 11 12/10/2018   AST 19 12/10/2018   NA 136 12/13/2018    K 3.8 12/13/2018   CL 104 12/13/2018   CREATININE 1.16 (H) 12/13/2018   BUN 18 12/13/2018   CO2 21 (L) 12/13/2018   INR 1.4 (H) 12/11/2018   HGBA1C 5.5 12/10/2018      PT/INR:  Recent Labs    12/11/18 1310  LABPROT 16.7*  INR 1.4*   Radiology: Dg Chest Port 1 View  Result Date: 12/13/2018 CLINICAL DATA:  Chest tube. EXAM: PORTABLE CHEST 1 VIEW COMPARISON:  12/12/2018. FINDINGS: Interim removal of Swan-Ganz catheter. Right IJ line noted with tip over cavoatrial junction. Left chest tube in stable position. No pneumothorax. Prior CABG. Stable cardiomegaly. Low lung volumes with progressive bibasilar atelectasis/infiltrates. Small right pleural effusion. IMPRESSION: 1. Interim removal Swan-Ganz catheter. Right IJ line noted with tip over cavoatrial junction. Left chest tube in stable position. No pneumothorax. 2. Low lung volumes with progressive bibasilar atelectasis/infiltrates. Small right pleural effusion. Electronically Signed   By: Marcello Moores  Register   On: 12/13/2018 06:49     Assessment/Plan: S/P Procedure(s) (LRB): CORONARY ARTERY BYPASS GRAFTING (CABG) x three , using left internal mammary artery, and right leg greater saphenous vein harvested endoscopically - SVG to OM1, SVG to Distal RCA (N/A) TRANSESOPHAGEAL ECHOCARDIOGRAM (TEE) (N/A) Mobilize Diuresis Diabetes control d/c tubes/lines Pt to see Expected Acute  Blood - loss Anemia- continue to monitor    Grace Isaac 12/13/2018 8:03 AM

## 2018-12-14 ENCOUNTER — Inpatient Hospital Stay (HOSPITAL_COMMUNITY): Payer: PPO

## 2018-12-14 DIAGNOSIS — I25738 Atherosclerosis of nonautologous biological coronary artery bypass graft(s) with other forms of angina pectoris: Secondary | ICD-10-CM

## 2018-12-14 LAB — BASIC METABOLIC PANEL
Anion gap: 8 (ref 5–15)
BUN: 19 mg/dL (ref 8–23)
CO2: 25 mmol/L (ref 22–32)
Calcium: 8.7 mg/dL — ABNORMAL LOW (ref 8.9–10.3)
Chloride: 106 mmol/L (ref 98–111)
Creatinine, Ser: 1.01 mg/dL — ABNORMAL HIGH (ref 0.44–1.00)
GFR calc Af Amer: >60 mL/min (ref >60)
GFR calc non Af Amer: 54 mL/min — ABNORMAL LOW (ref >60)
Glucose, Bld: 101 mg/dL — ABNORMAL HIGH (ref 70–99)
Potassium: 3.2 mmol/L — ABNORMAL LOW (ref 3.5–5.1)
Sodium: 139 mmol/L (ref 135–145)

## 2018-12-14 LAB — BPAM RBC
Blood Product Expiration Date: 202003262359
Blood Product Expiration Date: 202003262359
Blood Product Expiration Date: 202003272359
Blood Product Expiration Date: 202004072359
ISSUE DATE / TIME: 202003170754
ISSUE DATE / TIME: 202003170754
Unit Type and Rh: 5100
Unit Type and Rh: 5100
Unit Type and Rh: 5100
Unit Type and Rh: 5100

## 2018-12-14 LAB — TYPE AND SCREEN
ABO/RH(D): O POS
Antibody Screen: POSITIVE
Donor AG Type: NEGATIVE
Donor AG Type: NEGATIVE
Donor AG Type: NEGATIVE
Donor AG Type: NEGATIVE
Unit division: 0
Unit division: 0
Unit division: 0
Unit division: 0

## 2018-12-14 LAB — CBC
HCT: 27.3 % — ABNORMAL LOW (ref 36.0–46.0)
Hemoglobin: 8.7 g/dL — ABNORMAL LOW (ref 12.0–15.0)
MCH: 29.5 pg (ref 26.0–34.0)
MCHC: 31.9 g/dL (ref 30.0–36.0)
MCV: 92.5 fL (ref 80.0–100.0)
Platelets: 192 10*3/uL (ref 150–400)
RBC: 2.95 MIL/uL — ABNORMAL LOW (ref 3.87–5.11)
RDW: 13.3 % (ref 11.5–15.5)
WBC: 12.1 10*3/uL — ABNORMAL HIGH (ref 4.0–10.5)
nRBC: 0 % (ref 0.0–0.2)

## 2018-12-14 LAB — GLUCOSE, CAPILLARY
Glucose-Capillary: 105 mg/dL — ABNORMAL HIGH (ref 70–99)
Glucose-Capillary: 103 mg/dL — ABNORMAL HIGH (ref 70–99)
Glucose-Capillary: 120 mg/dL — ABNORMAL HIGH (ref 70–99)
Glucose-Capillary: 125 mg/dL — ABNORMAL HIGH (ref 70–99)
Glucose-Capillary: 99 mg/dL (ref 70–99)

## 2018-12-14 LAB — MAGNESIUM: Magnesium: 1.9 mg/dL (ref 1.7–2.4)

## 2018-12-14 MED ORDER — FUROSEMIDE 10 MG/ML IJ SOLN
40.0000 mg | Freq: Once | INTRAMUSCULAR | Status: AC
  Administered 2018-12-14: 40 mg via INTRAVENOUS
  Filled 2018-12-14: qty 4

## 2018-12-14 MED ORDER — POTASSIUM CHLORIDE CRYS ER 20 MEQ PO TBCR
20.0000 meq | EXTENDED_RELEASE_TABLET | ORAL | Status: AC | PRN
  Administered 2018-12-14 (×3): 20 meq via ORAL
  Filled 2018-12-14 (×3): qty 1

## 2018-12-14 NOTE — TOC Initial Note (Addendum)
Transition of Care [facility]) - Initial/Assessment Note    Patient Details  Name: Tina Dominguez MRN: [id number] Date of Birth: 09/18/1941  [facility] of [facility]) CM/SW Contact:    Midge Minium RN, BSN, NCM-BC, ACM-RN [phone] Phone Number: 12/14/2018, 4:02 PM  Clinical Narrative:  78 yo femaleis s/p CABG. Patient states living at home alone and being independent with her ADLs PTA. Patient has a rollator and 4-prong cane to assist during ambulation. Demographics/PCP verified with patient denying difficulty affording her prescriptions. PT eval complete with HHPT with 24hr/assist recommended. CM discussed recommendations for HHPT with patient declining at this time, verbalizing her son will be available for 2 weeks to assist with any needs, with her daughters able to support her son as needed. Patient indicated her family would be available to provide transportation home. CM team will continue to follow.         CM requested an OT eval for any DME recommendations  Expected Discharge Plan: Home/Self Care Barriers to Discharge: Continued Medical Work up   Patient Goals and CMS Choice Patient states their goals for this hospitalization and ongoing recovery are:: "To get home to tend to my garden"   Choice offered to / list presented to : NA  Expected Discharge Plan and Services Expected Discharge Plan: Home/Self Care In-house Referral: NA Discharge Planning Services: CM Consult Post Acute Care Choice: NA Living arrangements for the past 2 months: Single Family Home                 DME Arranged: N/A DME Agency: NA HH Arranged: Refused HH, NA [facility] Agency: NA  Prior Living Arrangements/Services Living arrangements for the past 2 months: Single Family Home Lives with:: Self Patient language and need for interpreter reviewed:: No Do you feel safe going back to the place where you live?: Yes      Need for Family Participation in Patient Care: Yes (Comment) Care giver support system in  place?: Yes (comment) Current home services: DME(rollator, 4-prong cane) Criminal Activity/Legal Involvement Pertinent to Current Situation/Hospitalization: No - Comment as needed  Activities of Daily Living Home Assistive Devices/Equipment: Dentures (specify type) ADL Screening (condition at time of admission) Patient's cognitive ability adequate to safely complete daily activities?: Yes Is the patient deaf or have difficulty hearing?: No Does the patient have difficulty seeing, even when wearing glasses/contacts?: No Does the patient have difficulty concentrating, remembering, or making decisions?: No Patient able to express need for assistance with ADLs?: Yes Does the patient have difficulty dressing or bathing?: No Independently performs ADLs?: Yes (appropriate for developmental age) Does the patient have difficulty walking or climbing stairs?: Yes Weakness of Legs: Both Weakness of Arms/Hands: None  Permission Sought/Granted Permission sought to share information with : Case Manager Permission granted to share information with : Yes, Verbal Permission Granted              Emotional Assessment Appearance:: Appears stated age Attitude/Demeanor/Rapport: Gracious, Engaged Affect (typically observed): Appropriate, Calm, Stable, Pleasant Orientation: : Oriented to Self, Oriented to Place, Oriented to  Time, Oriented to Situation Alcohol / Substance Use: Not Applicable Psych Involvement: No (comment)  Admission diagnosis:  CAD (coronary artery disease) [I25.10] Patient Active Problem List   Diagnosis Date Noted  . S/P CABG x 3 12/11/2018  . Coronary artery disease 12/11/2018  . CAD (coronary artery disease) 12/10/2018  . Abnormal myocardial perfusion study   . PAT (paroxysmal atrial tachycardia) ([place]) 12/05/2018  . Pre-op evaluation 12/05/2018  . Hyperkalemia 01/19/2018  .  Chronic anticoagulation 01/19/2018  . Chest pain in adult 12/18/2017  . Factor V Leiden mutation  ([place]) 12/18/2017  . 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HCC) 12/18/2017  . History of recurrent TIAs 12/13/2017  . S/P IVC filter 12/13/2017  . Schatzki's ring 12/13/2017  . Hiatal hernia 12/13/2017  . Valvular heart disease 12/13/2017  . CKD (chronic kidney disease) stage 3, GFR 30-59 ml/min (HCC) 12/13/2017  . Gout 12/13/2017  . Anxiety 12/13/2017  . Anemia 12/13/2017  . GI bleeding 10/04/2017  . Dysphagia 04/17/2017  . History of DVT (deep vein thrombosis) 04/17/2017  . 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[place]) 04/17/2017  . Aftercare following surgery of the circulatory system, [place] 10/14/2013  . Occlusion and stenosis of carotid artery without mention of cerebral infarction 10/08/2012  . History of right-sided carotid endarterectomy 10/10/2011  . Hyperlipidemia 05/26/2011  . Hypertension 05/26/2011  . Stroke ([place]) 05/26/2011  . Arthritis 05/26/2011  . Depression 05/26/2011  . Osteoarthritis 05/26/2011  . Carotid stenosis, bilateral 05/26/2011  . GERD (gastroesophageal reflux disease) 05/26/2011  . PE (pulmonary embolism) 05/26/2011   PCP:  Raelyn Number, MD Pharmacy:   [facility] [place] [address] Phone: [phone] Fax: [phone]  [id number] - [place], [address] AT [place] [address] [place] [address] Phone: [phone] Fax: [phone]     Social Determinants of Health (SDOH) Interventions    Readmission Risk Interventions Readmission Risk Prevention Plan 12/14/2018  Transportation Screening Complete  PCP or Specialist Appt within 3-5 Days Not Complete  Not Complete comments Attending has specialist f/u appointment arranged on 12/28/18  [place] or Home Care Consult Patient refused  Social Work Consult for [place] Planning/Counseling Not Complete  SW consult not completed comments Not appropriate; patient was screened for recovery care planning   Palliative Care Screening Not Applicable  Medication Review (RN Care Manager) Complete  Some recent data might be hidden

## 2018-12-14 NOTE — Progress Notes (Signed)
CT Surgery  Ambulated in hall maintaining nsr Comfortable chest tubes out

## 2018-12-14 NOTE — Progress Notes (Signed)
Patient ID: Tina Dominguez, female   DOB: 12-06-1940, 78 y.o.   MRN: [id number] TCTS DAILY ICU PROGRESS NOTE                   [address]            [place], [address]          [phone]   3 Days Post-Op Procedure(s) (LRB): CORONARY ARTERY BYPASS GRAFTING (CABG) x three , using left internal mammary artery, and right leg greater saphenous vein harvested endoscopically - SVG to OM1, SVG to Distal RCA (N/A) TRANSESOPHAGEAL ECHOCARDIOGRAM (TEE) (N/A)  Total Length of Stay:  LOS: 4 days   Subjective: Up in chair , holding sinus, walked in unit but slow   Objective: Vital signs in last 24 hours: Temp:  [97.4 F (36.3 C)-98.9 F (37.2 C)] 97.8 F (36.6 C) (03/20 0747) Pulse Rate:  [57-118] 70 (03/20 0700) Cardiac Rhythm: Normal sinus rhythm (03/20 0748) Resp:  [11-37] 21 (03/20 0700) BP: (91-164)/(54-93) 151/89 (03/20 0700) SpO2:  [90 %-99 %] 96 % (03/20 0700) Weight:  [72.4 kg] 72.4 kg (03/20 0646)  Filed Weights   12/12/18 0500 12/13/18 0500 12/14/18 0646  Weight: 75.6 kg 76.2 kg 72.4 kg    Weight change: -3.8 kg   Hemodynamic parameters for last 24 hours:    Intake/Output from previous day: 03/19 0701 - 03/20 0700 In: 1251.8 [P.O.:340; I.V.:911.8] Out: 1375 [Urine:1375]  Intake/Output this shift: No intake/output data recorded.  Current Meds: Scheduled Meds: . acetaminophen  1,000 mg Oral Q6H   Or  . acetaminophen (TYLENOL) oral liquid 160 mg/5 mL  1,000 mg Per Tube Q6H  . amiodarone  150 mg Intravenous Once  . amiodarone  200 mg Oral Q12H   Followed by  . [START ON 12/21/2018] amiodarone  200 mg Oral Daily  . aspirin EC  325 mg Oral Daily   Or  . aspirin  324 mg Per Tube Daily  . bisacodyl  10 mg Oral Daily   Or  . bisacodyl  10 mg Rectal Daily  . Chlorhexidine Gluconate Cloth  6 each Topical Daily  . citalopram  20 mg Oral QHS  . docusate sodium  200 mg Oral Daily  . enoxaparin (LOVENOX) injection  40 mg Subcutaneous QHS  . folic  acid  1 mg Oral Daily  . insulin aspart  0-24 Units Subcutaneous Q4H  . levothyroxine  125 mcg Oral QHS  . linaclotide  145 mcg Oral QAC breakfast  . mouth rinse  15 mL Mouth Rinse BID  . metoprolol tartrate  12.5 mg Oral BID   Or  . metoprolol tartrate  12.5 mg Per Tube BID  . mirtazapine  30 mg Oral QHS  . pantoprazole  40 mg Oral Daily  . pravastatin  40 mg Oral QHS  . sodium chloride flush  10-40 mL Intracatheter Q12H   Continuous Infusions: . sodium chloride Stopped (12/11/18 2010)  . sodium chloride 250 mL (12/12/18 0505)  . sodium chloride 20 mL/hr at 12/11/18 1300  . amiodarone 30 mg/hr (12/14/18 0033)  . dexmedetomidine (PRECEDEX) IV infusion Stopped (12/13/18 1347)  . lactated ringers    . lactated ringers    . lactated ringers Stopped (12/12/18 1136)  . nitroGLYCERIN Stopped (12/11/18 1303)  . phenylephrine (NEO-SYNEPHRINE) Adult infusion Stopped (12/12/18 1610)   PRN Meds:.sodium chloride, lactated ringers, metoprolol tartrate, midazolam, morphine injection, ondansetron (ZOFRAN) IV, oxyCODONE, potassium chloride, sodium chloride flush, traMADol  General appearance:  alert and cooperative Neurologic: intact Heart: regular rate and rhythm, S1, S2 normal, no murmur, click, rub or gallop Lungs: diminished breath sounds bibasilar Abdomen: soft, non-tender; bowel sounds normal; no masses,  no organomegaly Extremities: extremities normal, atraumatic, no cyanosis or edema and Homans sign is negative, no sign of DVT Wound: sternum intact  Lab Results: CBC: Recent Labs    12/13/18 0448 12/14/18 0503  WBC 16.2* 12.1*  HGB 9.2* 8.7*  HCT 28.3* 27.3*  PLT 200 192   BMET:  Recent Labs    12/13/18 0448 12/14/18 0503  NA 136 139  K 3.8 3.2*  CL 104 106  CO2 21* 25  GLUCOSE 122* 101*  BUN 18 19  CREATININE 1.16* 1.01*  CALCIUM 9.0 8.7*    CMET: Lab Results  Component Value Date   WBC 12.1 (H) 12/14/2018   HGB 8.7 (L) 12/14/2018   HCT 27.3 (L) 12/14/2018    PLT 192 12/14/2018   GLUCOSE 101 (H) 12/14/2018   ALT 11 12/10/2018   AST 19 12/10/2018   NA 139 12/14/2018   K 3.2 (L) 12/14/2018   CL 106 12/14/2018   CREATININE 1.01 (H) 12/14/2018   BUN 19 12/14/2018   CO2 25 12/14/2018   TSH 0.178 (L) 12/13/2018   INR 1.4 (H) 12/11/2018   HGBA1C 5.5 12/10/2018      PT/INR:  Recent Labs    12/11/18 1310  LABPROT 16.7*  INR 1.4*   Radiology: Dg Chest Port 1 View  Result Date: 12/14/2018 CLINICAL DATA:  Sore chest.  Chest tube removal. EXAM: PORTABLE CHEST 1 VIEW COMPARISON:  12/13/2018. FINDINGS: Right IJ line in stable position. Prior CABG. Cardiomegaly. Persistent bibasilar atelectasis/infiltrates, most prominent on the left. Persistent right pleural effusion. No pleural effusion or pneumothorax. IMPRESSION: 1.  Right IJ line stable position. 2. Prior CABG. Cardiomegaly with normal pulmonary vascularity. 3. Persistent bibasilar atelectasis/infiltrates Follow-up trauma left. Persistent right pleural effusion. Electronically Signed   By: Marcello Moores  Register   On: 12/14/2018 06:40     Assessment/Plan: S/P Procedure(s) (LRB): CORONARY ARTERY BYPASS GRAFTING (CABG) x three , using left internal mammary artery, and right leg greater saphenous vein harvested endoscopically - SVG to OM1, SVG to Distal RCA (N/A) TRANSESOPHAGEAL ECHOCARDIOGRAM (TEE) (N/A) Mobilize Diuresis Convert to po cordrone, Replace kcl     Grace Isaac 12/14/2018 7:58 AM

## 2018-12-14 NOTE — Progress Notes (Signed)
Physical Therapy Treatment Patient Details Name: Tina Dominguez MRN: [id number] DOB: [birth year] Today's Date: 12/14/2018    History of Present Illness Pt is a 78 y.o. female s/p CABG x 3.     PT Comments    Pt received in recliner, agreeable to participation in therapy. She required min guard assist transfers and min guard assist ambulation 225 with eva walker. Pt on 2 L O2 throughout session with SpO2 96%. Pt assisted on/off BSC prior to return to recliner. Increased cadence as well as decreased assist noted this session. Encouragement needed for increased distance. PT to continue per POC.    Follow Up Recommendations  Home health PT;Supervision/Assistance - 24 hour     Equipment Recommendations  None recommended by PT    Recommendations for Other Services       Precautions / Restrictions Precautions Precautions: Sternal Precaution Comments: Reviewed sternal precautions. Pt demo good adherence.  Restrictions Weight Bearing Restrictions: Yes Other Position/Activity Restrictions: sternal precautions    Mobility  Bed Mobility               General bed mobility comments: Pt received in recliner and returned to recliner.   Transfers Overall transfer level: Needs assistance Equipment used: Ambulation equipment used Transfers: Sit to/from Stand Sit to Stand: Min guard         General transfer comment: min guard for safety. Good adherence to sternal precautions.  Ambulation/Gait Ambulation/Gait assistance: Min guard Gait Distance (Feet): 225 Feet Assistive device: (eva walker) Gait Pattern/deviations: Step-through pattern;Decreased stride length;Trunk flexed Gait velocity: WFL Gait velocity interpretation: 1.31 - 2.62 ft/sec, indicative of limited community ambulator General Gait Details: Pt ambulated on 2 L O2 with SpO2 96%.   Stairs             Wheelchair Mobility    Modified Rankin (Stroke Patients Only)       Balance Overall balance  assessment: Needs assistance Sitting-balance support: No upper extremity supported;Feet supported Sitting balance-Leahy Scale: Good     Standing balance support: Bilateral upper extremity supported;During functional activity Standing balance-Leahy Scale: Fair Standing balance comment: static stand and ambulate in room without UE support                            Cognition Arousal/Alertness: Awake/alert Behavior During Therapy: Flat affect Overall Cognitive Status: Within Functional Limits for tasks assessed                                        Exercises      General Comments General comments (skin integrity, edema, etc.): VSS. Son present in room.      Pertinent Vitals/Pain Pain Assessment: Faces Faces Pain Scale: Hurts a little bit Pain Location: sternum Pain Descriptors / Indicators: Discomfort Pain Intervention(s): Monitored during session    Home Living                      Prior Function            PT Goals (current goals can now be found in the care plan section) Acute Rehab PT Goals Patient Stated Goal: feel better PT Goal Formulation: With patient/family Time For Goal Achievement: 12/27/18 Potential to Achieve Goals: Good Progress towards PT goals: Progressing toward goals    Frequency    Min 3X/week      PT  Plan Current plan remains appropriate    Co-evaluation              AM-PAC PT "6 Clicks" Mobility   Outcome Measure  Help needed turning from your back to your side while in a flat bed without using bedrails?: None Help needed moving from lying on your back to sitting on the side of a flat bed without using bedrails?: A Lot Help needed moving to and from a bed to a chair (including a wheelchair)?: A Little Help needed standing up from a chair using your arms (e.g., wheelchair or bedside chair)?: A Little Help needed to walk in hospital room?: A Little Help needed climbing 3-5 steps with a  railing? : A Little 6 Click Score: 18    End of Session Equipment Utilized During Treatment: Gait belt;Oxygen Activity Tolerance: Patient tolerated treatment well Patient left: with call bell/phone within reach;in chair;with family/visitor present Nurse Communication: Mobility status PT Visit Diagnosis: Muscle weakness (generalized) (M62.81);Other abnormalities of gait and mobility (R26.89);Difficulty in walking, not elsewhere classified (R26.2);Pain     Time: 9449-6759 PT Time Calculation (min) (ACUTE ONLY): 23 min  Charges:  $Gait Training: 23-37 mins                     Lorrin Goodell, [place]  Office # [phone] Pager [phone]    Tina Dominguez 12/14/2018, 12:00 PM

## 2018-12-14 NOTE — Anesthesia Postprocedure Evaluation (Signed)
Anesthesia Post Note  Patient: Tina Dominguez  Procedure(s) Performed: CORONARY ARTERY BYPASS GRAFTING (CABG) x three , using left internal mammary artery, and right leg greater saphenous vein harvested endoscopically - SVG to OM1, SVG to Distal RCA (N/A Chest) TRANSESOPHAGEAL ECHOCARDIOGRAM (TEE) (N/A )     Patient location during evaluation: SICU Anesthesia Type: General Level of consciousness: sedated Pain management: pain level controlled Vital Signs Assessment: post-procedure vital signs reviewed and stable Respiratory status: patient remains intubated per anesthesia plan Cardiovascular status: stable Postop Assessment: no apparent nausea or vomiting Anesthetic complications: no    Last Vitals:  Vitals:   12/14/18 1549 12/14/18 1600  BP:  137/60  Pulse:  64  Resp:  16  Temp: 36.5 C   SpO2:  99%    Last Pain:  Vitals:   12/14/18 1549  TempSrc: Oral  PainSc:                  Lakecia Deschamps

## 2018-12-14 NOTE — Progress Notes (Addendum)
Tele reviewed: one very brief supraventricular run, but no further atrial fibrillation on amiodarone gtt. Maintaining sinus rhythm. Reviewed pre-op meds and she has been anticoagulated with rivararoxaban. She has a hx of DVT/PE and IVC filter placed in 2019.  Will follow-up Monday to arrange cardiology follow-up as she is closer to hospital discharge.   Sherren Mocha 12/14/2018 7:58 AM

## 2018-12-15 ENCOUNTER — Inpatient Hospital Stay (HOSPITAL_COMMUNITY): Payer: PPO

## 2018-12-15 DIAGNOSIS — D6859 Other primary thrombophilia: Secondary | ICD-10-CM

## 2018-12-15 DIAGNOSIS — Z951 Presence of aortocoronary bypass graft: Secondary | ICD-10-CM

## 2018-12-15 DIAGNOSIS — I480 Paroxysmal atrial fibrillation: Secondary | ICD-10-CM

## 2018-12-15 LAB — GLUCOSE, CAPILLARY
GLUCOSE-CAPILLARY: 114 mg/dL — AB (ref 70–99)
Glucose-Capillary: 124 mg/dL — ABNORMAL HIGH (ref 70–99)
Glucose-Capillary: 150 mg/dL — ABNORMAL HIGH (ref 70–99)
Glucose-Capillary: 83 mg/dL (ref 70–99)
Glucose-Capillary: 92 mg/dL (ref 70–99)
Glucose-Capillary: 96 mg/dL (ref 70–99)

## 2018-12-15 LAB — BASIC METABOLIC PANEL
Anion gap: 10 (ref 5–15)
BUN: 17 mg/dL (ref 8–23)
CO2: 23 mmol/L (ref 22–32)
Calcium: 8.7 mg/dL — ABNORMAL LOW (ref 8.9–10.3)
Chloride: 106 mmol/L (ref 98–111)
Creatinine, Ser: 0.87 mg/dL (ref 0.44–1.00)
GFR calc Af Amer: >60 mL/min (ref >60)
GFR calc non Af Amer: >60 mL/min (ref >60)
Glucose, Bld: 88 mg/dL (ref 70–99)
Potassium: 3.3 mmol/L — ABNORMAL LOW (ref 3.5–5.1)
Sodium: 139 mmol/L (ref 135–145)

## 2018-12-15 LAB — CBC
HCT: 27 % — ABNORMAL LOW (ref 36.0–46.0)
Hemoglobin: 8.8 g/dL — ABNORMAL LOW (ref 12.0–15.0)
MCH: 30.1 pg (ref 26.0–34.0)
MCHC: 32.6 g/dL (ref 30.0–36.0)
MCV: 92.5 fL (ref 80.0–100.0)
Platelets: 259 10*3/uL (ref 150–400)
RBC: 2.92 MIL/uL — ABNORMAL LOW (ref 3.87–5.11)
RDW: 13.2 % (ref 11.5–15.5)
WBC: 9.9 10*3/uL (ref 4.0–10.5)
nRBC: 0 % (ref 0.0–0.2)

## 2018-12-15 MED ORDER — SODIUM CHLORIDE 0.9 % IV SOLN: 250.0000 mL | INTRAVENOUS | Status: DC | PRN

## 2018-12-15 MED ORDER — POTASSIUM CHLORIDE CRYS ER 20 MEQ PO TBCR: 20.0000 meq | EXTENDED_RELEASE_TABLET | Freq: Two times a day (BID) | ORAL | Status: DC

## 2018-12-15 MED ORDER — MOVING RIGHT ALONG BOOK
Freq: Once | Status: AC
  Administered 2018-12-15: 11:00:00

## 2018-12-15 MED ORDER — POTASSIUM CHLORIDE CRYS ER 20 MEQ PO TBCR
40.0000 meq | EXTENDED_RELEASE_TABLET | Freq: Once | ORAL | Status: AC
  Administered 2018-12-15: 40 meq via ORAL
  Filled 2018-12-15: qty 2

## 2018-12-15 MED ORDER — MIRTAZAPINE 15 MG PO TABS: 30.0000 mg | ORAL_TABLET | Freq: Every day | ORAL | Status: DC

## 2018-12-15 MED ORDER — FUROSEMIDE 40 MG PO TABS
40.0000 mg | ORAL_TABLET | Freq: Every day | ORAL | Status: DC
  Administered 2018-12-15: 40 mg via ORAL
  Filled 2018-12-15: qty 1

## 2018-12-15 MED ORDER — ATORVASTATIN CALCIUM 40 MG PO TABS
40.0000 mg | ORAL_TABLET | Freq: Every day | ORAL | Status: DC
  Administered 2018-12-15 – 2018-12-17 (×3): 40 mg via ORAL
  Filled 2018-12-15 (×3): qty 1

## 2018-12-15 MED ORDER — INSULIN ASPART 100 UNIT/ML ~~LOC~~ SOLN
0.0000 [IU] | Freq: Three times a day (TID) | SUBCUTANEOUS | Status: DC
  Administered 2018-12-15 – 2018-12-16 (×2): 2 [IU] via SUBCUTANEOUS

## 2018-12-15 MED ORDER — SODIUM CHLORIDE 0.9% FLUSH
3.0000 mL | Freq: Two times a day (BID) | INTRAVENOUS | Status: DC
  Administered 2018-12-15 – 2018-12-18 (×6): 3 mL via INTRAVENOUS

## 2018-12-15 MED ORDER — SODIUM CHLORIDE 0.9% FLUSH: 3.0000 mL | INTRAVENOUS | Status: DC | PRN

## 2018-12-15 NOTE — Progress Notes (Signed)
Progress Note  Patient Name: Tina Dominguez Date of Encounter: 12/15/2018  Primary Cardiologist: Shirlee More, MD   Subjective   States that she feels mild shortness of breath with activity but this is no significant change.  No chest pain.  She asked when she would be able to go home.  Inpatient Medications    Scheduled Meds:  acetaminophen  1,000 mg Oral Q6H   Or   acetaminophen (TYLENOL) oral liquid 160 mg/5 mL  1,000 mg Per Tube Q6H   amiodarone  150 mg Intravenous Once   amiodarone  200 mg Oral Q12H   Followed by   Derrill Memo ON 12/21/2018] amiodarone  200 mg Oral Daily   aspirin EC  325 mg Oral Daily   Or   aspirin  324 mg Per Tube Daily   bisacodyl  10 mg Oral Daily   Or   bisacodyl  10 mg Rectal Daily   Chlorhexidine Gluconate Cloth  6 each Topical Daily   citalopram  20 mg Oral QHS   docusate sodium  200 mg Oral Daily   enoxaparin (LOVENOX) injection  40 mg Subcutaneous QHS   folic acid  1 mg Oral Daily   insulin aspart  0-24 Units Subcutaneous Q4H   levothyroxine  125 mcg Oral QHS   linaclotide  145 mcg Oral QAC breakfast   mouth rinse  15 mL Mouth Rinse BID   metoprolol tartrate  12.5 mg Oral BID   Or   metoprolol tartrate  12.5 mg Per Tube BID   mirtazapine  30 mg Oral QHS   pantoprazole  40 mg Oral Daily   pravastatin  40 mg Oral QHS   sodium chloride flush  10-40 mL Intracatheter Q12H   Continuous Infusions:  sodium chloride Stopped (12/11/18 2010)   sodium chloride 250 mL (12/12/18 0505)   sodium chloride 20 mL/hr at 12/11/18 1300   amiodarone Stopped (12/14/18 0833)   dexmedetomidine (PRECEDEX) IV infusion Stopped (12/13/18 1347)   lactated ringers     lactated ringers     lactated ringers Stopped (12/12/18 1136)   nitroGLYCERIN Stopped (12/11/18 1303)   phenylephrine (NEO-SYNEPHRINE) Adult infusion Stopped (12/12/18 1610)   PRN Meds: sodium chloride, lactated ringers, metoprolol tartrate, midazolam, morphine  injection, ondansetron (ZOFRAN) IV, oxyCODONE, sodium chloride flush, traMADol   Vital Signs    Vitals:   12/15/18 0400 12/15/18 0500 12/15/18 0600 12/15/18 0753  BP: (!) 135/59 (!) 83/68 (!) 143/69   Pulse: 69 70 70   Resp: 14 17 15    Temp: 98.2 F (36.8 C)   98.6 F (37 C)  TempSrc: Oral   Oral  SpO2: 92% 98% 96%   Weight:  72.8 kg    Height:        Intake/Output Summary (Last 24 hours) at 12/15/2018 0840 Last data filed at 12/15/2018 0759 Gross per 24 hour  Intake --  Output 1475 ml  Net -1475 ml   Last 3 Weights 12/15/2018 12/14/2018 12/13/2018  Weight (lbs) 160 lb 7.9 oz 159 lb 9.8 oz 167 lb 15.9 oz  Weight (kg) 72.8 kg 72.4 kg 76.2 kg      Telemetry    Sinus rhythm, occasional PACs, no pauses, no further atrial fibrillation- Personally Reviewed  ECG    12/12/2018 sinus rhythm last EKG, ST elevation noted in V2 and V3, early precordial leads-prior EKG also reviewed not quite as significant ST changes as above.  Personally Reviewed  Physical Exam   GEN: No acute distress.  Currently in chair, appears fairly comfortable.  Right neck line in place Neck:  Central access noted Cardiac:  Telemetry demonstrating sinus rhythm, bypass scar noted Respiratory:  Normal respiratory effort. GI:  non-distended  MS: No significant edema; No deformity. Neuro:  Nonfocal  Psych: Normal affect   Labs    Chemistry Recent Labs  Lab 12/10/18 1835  12/13/18 0448 12/14/18 0503 12/15/18 0405  NA 140   < > 136 139 139  K 3.6   < > 3.8 3.2* 3.3*  CL 112*   < > 104 106 106  CO2 20*   < > 21* 25 23  GLUCOSE 94   < > 122* 101* 88  BUN 14   < > 18 19 17   CREATININE 0.79   < > 1.16* 1.01* 0.87  CALCIUM 8.8*   < > 9.0 8.7* 8.7*  PROT 6.3*  --   --   --   --   ALBUMIN 3.4*  --   --   --   --   AST 19  --   --   --   --   ALT 11  --   --   --   --   ALKPHOS 70  --   --   --   --   BILITOT 0.5  --   --   --   --   GFRNONAA >60   < > 46* 54* >60  GFRAA >60   < > 53* >60 >60   ANIONGAP 8   < > 11 8 10    < > = values in this interval not displayed.     Hematology Recent Labs  Lab 12/13/18 0448 12/14/18 0503 12/15/18 0405  WBC 16.2* 12.1* 9.9  RBC 3.04* 2.95* 2.92*  HGB 9.2* 8.7* 8.8*  HCT 28.3* 27.3* 27.0*  MCV 93.1 92.5 92.5  MCH 30.3 29.5 30.1  MCHC 32.5 31.9 32.6  RDW 13.7 13.3 13.2  PLT 200 192 259    Cardiac EnzymesNo results for input(s): TROPONINI in the last 168 hours. No results for input(s): TROPIPOC in the last 168 hours.   BNPNo results for input(s): BNP, PROBNP in the last 168 hours.   DDimer No results for input(s): DDIMER in the last 168 hours.   Radiology    Dg Chest Port 1 View  Result Date: 12/15/2018 CLINICAL DATA:  Patient status post CABG 12/11/2018. EXAM: PORTABLE CHEST 1 VIEW COMPARISON:  Single-view of the chest 12/14/2018 and 12/13/2018. FINDINGS: Right IJ catheter remains in place. Small bilateral pleural effusions and basilar atelectasis are again seen, greater on the right. Effusions and atelectasis are improved compared to yesterday's study. There is cardiomegaly. Seven intact median sternotomy wires are unchanged. No pneumothorax. IMPRESSION: Improved small bilateral pleural effusions and basilar atelectasis, greater on the right. No new abnormality. Electronically Signed   By: Inge Rise M.D.   On: 12/15/2018 08:35   Dg Chest Port 1 View  Result Date: 12/14/2018 CLINICAL DATA:  Sore chest.  Chest tube removal. EXAM: PORTABLE CHEST 1 VIEW COMPARISON:  12/13/2018. FINDINGS: Right IJ line in stable position. Prior CABG. Cardiomegaly. Persistent bibasilar atelectasis/infiltrates, most prominent on the left. Persistent right pleural effusion. No pleural effusion or pneumothorax. IMPRESSION: 1.  Right IJ line stable position. 2. Prior CABG. Cardiomegaly with normal pulmonary vascularity. 3. Persistent bibasilar atelectasis/infiltrates Follow-up trauma left. Persistent right pleural effusion. Electronically Signed   By:  Marcello Moores  Register   On: 12/14/2018 06:40    Cardiac Studies  Severe multivessel coronary artery disease  Patient Profile     78 y.o. female severe multivessel coronary disease status post CABG x3, SVG to OM1, SVG to distal RCA, LIMA to LAD  Assessment & Plan    Severe multivessel coronary artery disease - Continue with aggressive secondary risk factor prevention including aspirin, metoprolol, statin therapy which is currently pravastatin 40 mg at night.  Would be optimal to be on a high intensity statin.  She states that she is never been on atorvastatin or rosuvastatin.  I will change her to atorvastatin 40 mg a day, high intensity statin.  Paroxysmal atrial fibrillation/postoperatively - Currently on p.o. amiodarone.  Hopefully this will be short-term use.  Continue to optimize potassium greater than 4. -Recent ZIO long-term heart monitor was utilized for 7 days on 11/09/2018 and rhythm throughout was sinus with heart rates 45-1 15.  No significant pauses.  Rare PVCs.   Chronic anticoagulation - She has been on chronic anticoagulation with rivaroxaban (Xarelto).  She has a history of DVT PE and IVC filter placed in 2019.  Has had venous thromboembolism and hypercoagulable state according to Dr. Bettina Gavia.  When felt safe from a surgical perspective, would resume Xarelto.      For questions or updates, please contact [place] Please consult [email or web address] for contact info under        Signed, Candee Furbish, MD  12/15/2018, 8:40 AM

## 2018-12-15 NOTE — Plan of Care (Signed)
  Problem: Education: Goal: Knowledge of General Education information will improve Description Including pain rating scale, medication(s)/side effects and non-pharmacologic comfort measures Outcome: Progressing   Problem: Clinical Measurements: Goal: Ability to maintain clinical measurements within normal limits will improve Outcome: Progressing Goal: Postoperative complications will be avoided or minimized Outcome: Progressing   Problem: Skin Integrity: Goal: Demonstration of wound healing without infection will improve Outcome: Progressing

## 2018-12-15 NOTE — Progress Notes (Signed)
CARDIAC REHAB PHASE I   PRE:  Rate/Rhythm: 71 SR  BP:  Sitting: 125/67      SaO2: 95 RA  MODE:  Ambulation: 220 ft   POST:  Rate/Rhythm: 96 SR  BP:  Sitting: 126/72    SaO2: 96 RA   Pt ambulated 2103ft in hallway standby assist with front wheel walker. Pt returned to recliner, call bell and BSC within reach. Encouraged continued IS use, and walks. Will continue to follow.  [id number] Rufina Falco, RN BSN 12/15/2018 2:36 PM

## 2018-12-15 NOTE — Progress Notes (Addendum)
4 Days Post-Op Procedure(s) (LRB): CORONARY ARTERY BYPASS GRAFTING (CABG) x three , using left internal mammary artery, and right leg greater saphenous vein harvested endoscopically - SVG to OM1, SVG to Distal RCA (N/A) TRANSESOPHAGEAL ECHOCARDIOGRAM (TEE) (N/A) Subjective: nsr Walking in hall cxr clear  Objective: Vital signs in last 24 hours: Temp:  [97.7 F (36.5 C)-98.9 F (37.2 C)] 98.6 F (37 C) (03/21 0753) Pulse Rate:  [57-76] 69 (03/21 1000) Cardiac Rhythm: Normal sinus rhythm (03/21 0900) Resp:  [13-31] 15 (03/21 0600) BP: (83-158)/(48-94) 131/67 (03/21 1000) SpO2:  [90 %-100 %] 97 % (03/21 1000) Weight:  [72.8 kg] 72.8 kg (03/21 0500)  Hemodynamic parameters for last 24 hours:   stable Intake/Output from previous day: 03/20 0701 - 03/21 0700 In: -  Out: 7903 [Urine:1650] Intake/Output this shift: Total I/O In: -  Out: 105 [Urine:105]       Exam    General- alert and comfortable    Neck- no JVD, no cervical adenopathy palpable, no carotid bruit   Lungs- clear without rales, wheezes   Cor- regular rate and rhythm, no murmur , gallop   Abdomen- soft, non-tender   Extremities - warm, non-tender, minimal edema   Neuro- oriented, appropriate, no focal weakness   Lab Results: Recent Labs    12/14/18 0503 12/15/18 0405  WBC 12.1* 9.9  HGB 8.7* 8.8*  HCT 27.3* 27.0*  PLT 192 259   BMET:  Recent Labs    12/14/18 0503 12/15/18 0405  NA 139 139  K 3.2* 3.3*  CL 106 106  CO2 25 23  GLUCOSE 101* 88  BUN 19 17  CREATININE 1.01* 0.87  CALCIUM 8.7* 8.7*    PT/INR: No results for input(s): LABPROT, INR in the last 72 hours. ABG    Component Value Date/Time   PHART 7.307 (L) 12/11/2018 1828   HCO3 20.5 12/11/2018 1828   TCO2 22 12/11/2018 1828   ACIDBASEDEF 5.0 (H) 12/11/2018 1828   O2SAT 98.0 12/11/2018 1828   CBG (last 3)  Recent Labs    12/15/18 0005 12/15/18 0407 12/15/18 0750  GLUCAP 96 92 150*    Assessment/Plan: S/P Procedure(s)  (LRB): CORONARY ARTERY BYPASS GRAFTING (CABG) x three , using left internal mammary artery, and right leg greater saphenous vein harvested endoscopically - SVG to OM1, SVG to Distal RCA (N/A) TRANSESOPHAGEAL ECHOCARDIOGRAM (TEE) (N/A)t x to progressive care Po amio w/ low dose lovenox  Pt states her son will care for her at  D C  LOS: 5 days    Tina Dominguez 12/15/2018

## 2018-12-16 ENCOUNTER — Inpatient Hospital Stay (HOSPITAL_COMMUNITY): Payer: PPO

## 2018-12-16 LAB — CBC
HCT: 33.3 % — ABNORMAL LOW (ref 36.0–46.0)
Hemoglobin: 10.6 g/dL — ABNORMAL LOW (ref 12.0–15.0)
MCH: 29 pg (ref 26.0–34.0)
MCHC: 31.8 g/dL (ref 30.0–36.0)
MCV: 91 fL (ref 80.0–100.0)
Platelets: 410 10*3/uL — ABNORMAL HIGH (ref 150–400)
RBC: 3.66 MIL/uL — ABNORMAL LOW (ref 3.87–5.11)
RDW: 13 % (ref 11.5–15.5)
WBC: 9.9 10*3/uL (ref 4.0–10.5)
nRBC: 0 % (ref 0.0–0.2)

## 2018-12-16 LAB — BASIC METABOLIC PANEL
Anion gap: 12 (ref 5–15)
BUN: 17 mg/dL (ref 8–23)
CO2: 25 mmol/L (ref 22–32)
Calcium: 9 mg/dL (ref 8.9–10.3)
Chloride: 103 mmol/L (ref 98–111)
Creatinine, Ser: 0.98 mg/dL (ref 0.44–1.00)
GFR calc Af Amer: >60 mL/min (ref >60)
GFR calc non Af Amer: 56 mL/min — ABNORMAL LOW (ref >60)
Glucose, Bld: 145 mg/dL — ABNORMAL HIGH (ref 70–99)
Potassium: 4.1 mmol/L (ref 3.5–5.1)
Sodium: 140 mmol/L (ref 135–145)

## 2018-12-16 LAB — GLUCOSE, CAPILLARY: Glucose-Capillary: 129 mg/dL — ABNORMAL HIGH (ref 70–99)

## 2018-12-16 MED ORDER — AMLODIPINE BESYLATE 5 MG PO TABS
5.0000 mg | ORAL_TABLET | Freq: Every day | ORAL | Status: DC
  Administered 2018-12-16 – 2018-12-18 (×3): 5 mg via ORAL
  Filled 2018-12-16 (×3): qty 1

## 2018-12-16 MED ORDER — RIVAROXABAN 20 MG PO TABS
20.0000 mg | ORAL_TABLET | Freq: Every day | ORAL | Status: DC
  Administered 2018-12-17 – 2018-12-18 (×2): 20 mg via ORAL
  Filled 2018-12-16 (×2): qty 1

## 2018-12-16 NOTE — Progress Notes (Signed)
Patient walked for about 360 ft this morning at 0500.  Without a walker. Tolerated it well.

## 2018-12-16 NOTE — Progress Notes (Signed)
EPW removed per protocol. Tips intact. Pt tolerated well. Educated on bedrest for one hour. Call light in reach. Will continue to monitor.  Clyde Canterbury, RN

## 2018-12-16 NOTE — Progress Notes (Addendum)
[address]       [place] [address]             [phone]      5 Days Post-Op Procedure(s) (LRB): CORONARY ARTERY BYPASS GRAFTING (CABG) x three , using left internal mammary artery, and right leg greater saphenous vein harvested endoscopically - SVG to OM1, SVG to Distal RCA (N/A) TRANSESOPHAGEAL ECHOCARDIOGRAM (TEE) (N/A) Subjective: conts to progress well  Objective: Vital signs in last 24 hours: Temp:  [97.9 F (36.6 C)-98.5 F (36.9 C)] 98 F (36.7 C) (03/22 0453) Pulse Rate:  [66-82] 66 (03/22 0453) Cardiac Rhythm: Normal sinus rhythm (03/22 0700) Resp:  [15-16] 16 (03/22 0453) BP: (109-171)/(67-96) 162/96 (03/22 0453) SpO2:  [95 %-98 %] 95 % (03/22 0453) Weight:  [70.9 kg] 70.9 kg (03/22 0453)  Hemodynamic parameters for last 24 hours:    Intake/Output from previous day: 03/21 0701 - 03/22 0700 In: 240 [P.O.:240] Out: 1655 [Urine:1655] Intake/Output this shift: No intake/output data recorded.  General appearance: alert, cooperative and no distress Heart: regular rate and rhythm Lungs: clear to auscultation bilaterally Abdomen: benign Extremities: no edema Wound: incis healing well  Lab Results: Recent Labs    12/15/18 0405 12/16/18 0612  WBC 9.9 9.9  HGB 8.8* 10.6*  HCT 27.0* 33.3*  PLT 259 410*   BMET:  Recent Labs    12/15/18 0405 12/16/18 0612  NA 139 140  K 3.3* 4.1  CL 106 103  CO2 23 25  GLUCOSE 88 145*  BUN 17 17  CREATININE 0.87 0.98  CALCIUM 8.7* 9.0    PT/INR: No results for input(s): LABPROT, INR in the last 72 hours. ABG    Component Value Date/Time   PHART 7.307 (L) 12/11/2018 1828   HCO3 20.5 12/11/2018 1828   TCO2 22 12/11/2018 1828   ACIDBASEDEF 5.0 (H) 12/11/2018 1828   O2SAT 98.0 12/11/2018 1828   CBG (last 3)  Recent Labs    12/15/18 1603 12/15/18 2119 12/16/18 0641  GLUCAP 114* 124* 129*    Meds Scheduled Meds: . acetaminophen  1,000 mg Oral Q6H   Or  . acetaminophen  (TYLENOL) oral liquid 160 mg/5 mL  1,000 mg Per Tube Q6H  . amiodarone  200 mg Oral Q12H   Followed by  . [START ON 12/21/2018] amiodarone  200 mg Oral Daily  . aspirin EC  325 mg Oral Daily   Or  . aspirin  324 mg Per Tube Daily  . atorvastatin  40 mg Oral q1800  . bisacodyl  10 mg Oral Daily   Or  . bisacodyl  10 mg Rectal Daily  . Chlorhexidine Gluconate Cloth  6 each Topical Daily  . citalopram  20 mg Oral QHS  . docusate sodium  200 mg Oral Daily  . enoxaparin (LOVENOX) injection  40 mg Subcutaneous QHS  . folic acid  1 mg Oral Daily  . furosemide  40 mg Oral Daily  . insulin aspart  0-24 Units Subcutaneous TID AC & HS  . levothyroxine  125 mcg Oral QHS  . linaclotide  145 mcg Oral QAC breakfast  . mouth rinse  15 mL Mouth Rinse BID  . metoprolol tartrate  12.5 mg Oral BID   Or  . metoprolol tartrate  12.5 mg Per Tube BID  . pantoprazole  40 mg Oral Daily  . potassium chloride  20 mEq Oral BID  . sodium chloride flush  10-40 mL Intracatheter Q12H  . sodium  chloride flush  3 mL Intravenous Q12H   Continuous Infusions: . sodium chloride Stopped (12/11/18 2010)  . sodium chloride 250 mL (12/12/18 0505)  . sodium chloride 20 mL/hr at 12/11/18 1300  . sodium chloride     PRN Meds:.sodium chloride, sodium chloride, metoprolol tartrate, ondansetron (ZOFRAN) IV, oxyCODONE, sodium chloride flush, sodium chloride flush, traMADol  Xrays Dg Chest 2 View  Result Date: 12/16/2018 CLINICAL DATA:  78 year old female with history of CABG. EXAM: CHEST - 2 VIEW COMPARISON:  Chest x-ray 12/15/2018. FINDINGS: Previously noted right IJ catheter has been removed. Patient is severely rotated to the right limiting the diagnostic sensitivity and specificity of today's examination. With these limitations in mind there is no definite consolidative airspace disease. Small bilateral pleural effusions with associated areas of subsegmental atelectasis are noted in the lung bases bilaterally. No evidence  of pulmonary edema. Heart size appears borderline enlarged. The patient is rotated to the right on today's exam, resulting in distortion of the mediastinal contours. Aortic atherosclerosis. Status post median sternotomy for CABG including LIMA to the LAD. IMPRESSION: 1. Support apparatus and postoperative changes, as above. 2. Small bilateral pleural effusions with areas of subsegmental atelectasis in the lung bases bilaterally. Electronically Signed   By: Vinnie Langton M.D.   On: 12/16/2018 06:29   Dg Chest Port 1 View  Result Date: 12/15/2018 CLINICAL DATA:  Patient status post CABG 12/11/2018. EXAM: PORTABLE CHEST 1 VIEW COMPARISON:  Single-view of the chest 12/14/2018 and 12/13/2018. FINDINGS: Right IJ catheter remains in place. Small bilateral pleural effusions and basilar atelectasis are again seen, greater on the right. Effusions and atelectasis are improved compared to yesterday's study. There is cardiomegaly. Seven intact median sternotomy wires are unchanged. No pneumothorax. IMPRESSION: Improved small bilateral pleural effusions and basilar atelectasis, greater on the right. No new abnormality. Electronically Signed   By: Inge Rise M.D.   On: 12/15/2018 08:35    Assessment/Plan: S/P Procedure(s) (LRB): CORONARY ARTERY BYPASS GRAFTING (CABG) x three , using left internal mammary artery, and right leg greater saphenous vein harvested endoscopically - SVG to OM1, SVG to Distal RCA (N/A) TRANSESOPHAGEAL ECHOCARDIOGRAM (TEE) (N/A)  1 doing well overall 2 BP elevated but somewhat variable, will resume home norvasc dosing 3 sinus rhythm, d/c wires today, ? Restart Xarelto 4 sugars adeq controlled- not on meds preop, d/c checks 5 anemia with improved trend 6 push routine pulm toilet and cardiac rehab 7 renal fxn normal, wt down- I think we can stop lasix, normal EF  LOS: 6 days    John Giovanni PA-C 12/16/2018 Pager [phone]  nsr EPWs out resume xarelto tomorrow patient  examined and medical record reviewed,agree with above note. Tharon Aquas Trigt III 12/16/2018

## 2018-12-16 NOTE — Evaluation (Signed)
Occupational Therapy Evaluation and Discharge Patient Details Name: Tina Dominguez MRN: [id number] DOB: August 06, 1941 Today's Date: 12/16/2018    History of Present Illness Pt is a 78 y.o. female s/p CABG x 3.    Clinical Impression   Pt is functioning modified independently in ADL and mobility. Needing cues for bed mobility adhering to sternal precautions. Educated in sternal precautions related to IADL. Pt will have assistance of her son who lives nearby upon discharge. No further OT needs.    Follow Up Recommendations  No OT follow up    Equipment Recommendations  None recommended by OT    Recommendations for Other Services       Precautions / Restrictions Precautions Precautions: Sternal Precaution Comments: pt needing cues for sternal precautions during bed mobility, educated at length in precaution related to IADL      Mobility Bed Mobility Overal bed mobility: Modified Independent             General bed mobility comments: cues to avoid UE pushing  Transfers Overall transfer level: Independent Equipment used: None                  Balance     Sitting balance-Leahy Scale: Good       Standing balance-Leahy Scale: Good                             ADL either performed or assessed with clinical judgement   ADL Overall ADL's : Modified independent                                       General ADL Comments: assisted pt in washing hair over sink     Vision Patient Visual Report: No change from baseline       Perception     Praxis      Pertinent Vitals/Pain Pain Assessment: No/denies pain     Hand Dominance Right   Extremity/Trunk Assessment Upper Extremity Assessment Upper Extremity Assessment: Overall WFL for tasks assessed   Lower Extremity Assessment Lower Extremity Assessment: Overall WFL for tasks assessed   Cervical / Trunk Assessment Cervical / Trunk Assessment: Kyphotic   Communication  Communication Communication: No difficulties   Cognition Arousal/Alertness: Awake/alert Behavior During Therapy: WFL for tasks assessed/performed Overall Cognitive Status: Within Functional Limits for tasks assessed                                     General Comments       Exercises     Shoulder Instructions      Home Living Family/patient expects to be discharged to:: Private residence Living Arrangements: Alone Available Help at Discharge: Family;Available PRN/intermittently(son lives across the street) Type of Home: Mobile home Home Access: Ramped entrance     [place]: One level     Bathroom Shower/Tub: Occupational psychologist: [place]: Environmental consultant - 2 wheels;Cane - single point;Grab bars - tub/shower;Shower seat - built in          Prior Functioning/Environment Level of Independence: Independent        Comments: likes to garden, former CNA        OT Problem List:        OT Treatment/Interventions:  OT Goals(Current goals can be found in the care plan section) Acute Rehab OT Goals Patient Stated Goal: go home  OT Frequency:     Barriers to D/C:            Co-evaluation              AM-PAC OT "6 Clicks" Daily Activity     Outcome Measure Help from another person eating meals?: None Help from another person taking care of personal grooming?: None Help from another person toileting, which includes using toliet, bedpan, or urinal?: None Help from another person bathing (including washing, rinsing, drying)?: None Help from another person to put on and taking off regular upper body clothing?: None Help from another person to put on and taking off regular lower body clothing?: None 6 Click Score: 24   End of Session    Activity Tolerance: Patient tolerated treatment well Patient left: in chair;with call bell/phone within reach  OT Visit Diagnosis: Muscle weakness (generalized) (M62.81)                 Time: 6270-3500 OT Time Calculation (min): 33 min Charges:  OT General Charges $OT Visit: 1 Visit OT Evaluation $OT Eval Moderate Complexity: 1 Mod OT Treatments $Self Care/Home Management : 8-22 mins  Nestor Lewandowsky, OTR/L Acute Rehabilitation Services Pager: [phone] Office: [phone]  Malka So 12/16/2018, 9:00 AM

## 2018-12-17 ENCOUNTER — Other Ambulatory Visit: Payer: Self-pay

## 2018-12-17 LAB — POCT I-STAT 7, (LYTES, BLD GAS, ICA,H+H)
Acid-Base Excess: 2 mmol/L (ref 0.0–2.0)
Acid-base deficit: 2 mmol/L (ref 0.0–2.0)
Bicarbonate: 23.1 mmol/L (ref 20.0–28.0)
Bicarbonate: 25.1 mmol/L (ref 20.0–28.0)
Calcium, Ion: 0.85 mmol/L — CL (ref 1.15–1.40)
Calcium, Ion: 1.04 mmol/L — ABNORMAL LOW (ref 1.15–1.40)
HCT: 23 % — ABNORMAL LOW (ref 36.0–46.0)
HCT: 26 % — ABNORMAL LOW (ref 36.0–46.0)
HEMOGLOBIN: 8.8 g/dL — AB (ref 12.0–15.0)
Hemoglobin: 7.8 g/dL — ABNORMAL LOW (ref 12.0–15.0)
O2 Saturation: 100 %
O2 Saturation: 100 %
PO2 ART: 244 mmHg — AB (ref 83.0–108.0)
Potassium: 4.8 mmol/L (ref 3.5–5.1)
Potassium: 3.5 mmol/L (ref 3.5–5.1)
Sodium: 139 mmol/L (ref 135–145)
Sodium: 140 mmol/L (ref 135–145)
TCO2: 24 mmol/L (ref 22–32)
TCO2: 26 mmol/L (ref 22–32)
pCO2 arterial: 42.9 mmHg (ref 32.0–48.0)
pCO2 arterial: 33.4 mmHg (ref 32.0–48.0)
pH, Arterial: 7.34 — ABNORMAL LOW (ref 7.350–7.450)
pH, Arterial: 7.483 — ABNORMAL HIGH (ref 7.350–7.450)
pO2, Arterial: 486 mmHg — ABNORMAL HIGH (ref 83.0–108.0)

## 2018-12-17 LAB — POCT I-STAT 4, (NA,K, GLUC, HGB,HCT)
GLUCOSE: 99 mg/dL (ref 70–99)
Glucose, Bld: 128 mg/dL — ABNORMAL HIGH (ref 70–99)
Glucose, Bld: 135 mg/dL — ABNORMAL HIGH (ref 70–99)
Glucose, Bld: 161 mg/dL — ABNORMAL HIGH (ref 70–99)
Glucose, Bld: 131 mg/dL — ABNORMAL HIGH (ref 70–99)
Glucose, Bld: 104 mg/dL — ABNORMAL HIGH (ref 70–99)
HCT: 28 % — ABNORMAL LOW (ref 36.0–46.0)
HCT: 22 % — ABNORMAL LOW (ref 36.0–46.0)
HCT: 29 % — ABNORMAL LOW (ref 36.0–46.0)
HCT: 26 % — ABNORMAL LOW (ref 36.0–46.0)
HCT: 23 % — ABNORMAL LOW (ref 36.0–46.0)
HCT: 29 % — ABNORMAL LOW (ref 36.0–46.0)
Hemoglobin: 9.5 g/dL — ABNORMAL LOW (ref 12.0–15.0)
Hemoglobin: 7.5 g/dL — ABNORMAL LOW (ref 12.0–15.0)
Hemoglobin: 9.9 g/dL — ABNORMAL LOW (ref 12.0–15.0)
Hemoglobin: 8.8 g/dL — ABNORMAL LOW (ref 12.0–15.0)
Hemoglobin: 7.8 g/dL — ABNORMAL LOW (ref 12.0–15.0)
Hemoglobin: 9.9 g/dL — ABNORMAL LOW (ref 12.0–15.0)
Potassium: 3.3 mmol/L — ABNORMAL LOW (ref 3.5–5.1)
Potassium: 3.4 mmol/L — ABNORMAL LOW (ref 3.5–5.1)
Potassium: 3.5 mmol/L (ref 3.5–5.1)
Potassium: 4 mmol/L (ref 3.5–5.1)
Potassium: 3.5 mmol/L (ref 3.5–5.1)
Potassium: 3.2 mmol/L — ABNORMAL LOW (ref 3.5–5.1)
SODIUM: 139 mmol/L (ref 135–145)
Sodium: 141 mmol/L (ref 135–145)
Sodium: 143 mmol/L (ref 135–145)
Sodium: 140 mmol/L (ref 135–145)
Sodium: 140 mmol/L (ref 135–145)
Sodium: 142 mmol/L (ref 135–145)

## 2018-12-17 LAB — BASIC METABOLIC PANEL WITH GFR
Anion gap: 10 (ref 5–15)
BUN: 13 mg/dL (ref 8–23)
CO2: 25 mmol/L (ref 22–32)
Calcium: 8.6 mg/dL — ABNORMAL LOW (ref 8.9–10.3)
Chloride: 104 mmol/L (ref 98–111)
Creatinine, Ser: 0.82 mg/dL (ref 0.44–1.00)
GFR calc Af Amer: >60 mL/min
GFR calc non Af Amer: >60 mL/min
Glucose, Bld: 95 mg/dL (ref 70–99)
Potassium: 3.3 mmol/L — ABNORMAL LOW (ref 3.5–5.1)
Sodium: 139 mmol/L (ref 135–145)

## 2018-12-17 LAB — CBC
HCT: 29 % — ABNORMAL LOW (ref 36.0–46.0)
Hemoglobin: 9.1 g/dL — ABNORMAL LOW (ref 12.0–15.0)
MCH: 28.3 pg (ref 26.0–34.0)
MCHC: 31.4 g/dL (ref 30.0–36.0)
MCV: 90.3 fL (ref 80.0–100.0)
Platelets: 351 K/uL (ref 150–400)
RBC: 3.21 MIL/uL — ABNORMAL LOW (ref 3.87–5.11)
RDW: 12.7 % (ref 11.5–15.5)
WBC: 8.8 K/uL (ref 4.0–10.5)
nRBC: 0 % (ref 0.0–0.2)

## 2018-12-17 MED ORDER — ASPIRIN 81 MG PO CHEW: 81.0000 mg | CHEWABLE_TABLET | Freq: Every day | ORAL | Status: DC

## 2018-12-17 MED ORDER — AMIODARONE HCL IN DEXTROSE 360-4.14 MG/200ML-% IV SOLN
30.0000 mg/h | INTRAVENOUS | Status: AC
  Administered 2018-12-17: 30 mg/h via INTRAVENOUS
  Filled 2018-12-17: qty 200

## 2018-12-17 MED ORDER — ASPIRIN EC 81 MG PO TBEC
81.0000 mg | DELAYED_RELEASE_TABLET | Freq: Every day | ORAL | Status: DC
  Administered 2018-12-18: 81 mg via ORAL
  Filled 2018-12-17: qty 1

## 2018-12-17 MED ORDER — AMIODARONE HCL 200 MG PO TABS
200.0000 mg | ORAL_TABLET | Freq: Two times a day (BID) | ORAL | Status: DC
  Administered 2018-12-17 – 2018-12-18 (×2): 200 mg via ORAL
  Filled 2018-12-17 (×2): qty 1

## 2018-12-17 MED ORDER — AMIODARONE HCL IN DEXTROSE 360-4.14 MG/200ML-% IV SOLN
30.0000 mg/h | INTRAVENOUS | Status: DC
  Filled 2018-12-17: qty 200

## 2018-12-17 MED ORDER — POTASSIUM CHLORIDE CRYS ER 20 MEQ PO TBCR
40.0000 meq | EXTENDED_RELEASE_TABLET | Freq: Two times a day (BID) | ORAL | Status: AC
  Administered 2018-12-17 (×2): 40 meq via ORAL
  Filled 2018-12-17 (×2): qty 2

## 2018-12-17 NOTE — Progress Notes (Signed)
Physical Therapy Treatment Patient Details Name: Tina Dominguez MRN: [id number] DOB: 1941/06/10 Today's Date: 12/17/2018    History of Present Illness Pt is a 78 y.o. female s/p CABG x 3.     PT Comments    Patient progressing well with mobility, tolerated ambulation without device. No physical assist required. VSS with HR upper 80s. Denies SOB at this time. Current POC remains appropriate.   Follow Up Recommendations  Home health PT;Supervision/Assistance - 24 hour     Equipment Recommendations  None recommended by PT    Recommendations for Other Services       Precautions / Restrictions Precautions Precautions: Sternal Precaution Comments: pt needing cues for sternal precautions during bed mobility, educated at length in precaution related to IADL Restrictions Weight Bearing Restrictions: Yes(sternal precautions) Other Position/Activity Restrictions: sternal precautions    Mobility  Bed Mobility Overal bed mobility: Modified Independent Bed Mobility: Supine to Sit     Supine to sit: Mod assist;HOB elevated     General bed mobility comments: cues to avoid UE pushing  Transfers Overall transfer level: Independent Equipment used: None Transfers: Sit to/from Stand Sit to Stand: Min guard         General transfer comment: min guard for safety. Good adherence to sternal precautions.  Ambulation/Gait Ambulation/Gait assistance: Supervision Gait Distance (Feet): 200 Feet Assistive device: None Gait Pattern/deviations: Step-through pattern;Decreased stride length;Trunk flexed Gait velocity: WFL Gait velocity interpretation: 1.31 - 2.62 ft/sec, indicative of limited community ambulator General Gait Details: Steady with ambulation, VSS   Stairs             Wheelchair Mobility    Modified Rankin (Stroke Patients Only)       Balance Overall balance assessment: Needs assistance Sitting-balance support: No upper extremity supported;Feet  supported Sitting balance-Leahy Scale: Good     Standing balance support: Bilateral upper extremity supported;During functional activity Standing balance-Leahy Scale: Good                              Cognition Arousal/Alertness: Awake/alert Behavior During Therapy: WFL for tasks assessed/performed Overall Cognitive Status: Within Functional Limits for tasks assessed                                        Exercises      General Comments        Pertinent Vitals/Pain Pain Assessment: No/denies pain Faces Pain Scale: Hurts a little bit Pain Location: sternum Pain Descriptors / Indicators: Discomfort Pain Intervention(s): Monitored during session    Home Living                      Prior Function            PT Goals (current goals can now be found in the care plan section) Acute Rehab PT Goals Patient Stated Goal: go home PT Goal Formulation: With patient/family Time For Goal Achievement: 12/27/18 Potential to Achieve Goals: Good Progress towards PT goals: Progressing toward goals    Frequency    Min 3X/week      PT Plan Current plan remains appropriate    Co-evaluation              AM-PAC PT "6 Clicks" Mobility   Outcome Measure  Help needed turning from your back to your side while in a flat bed without  using bedrails?: None Help needed moving from lying on your back to sitting on the side of a flat bed without using bedrails?: None Help needed moving to and from a bed to a chair (including a wheelchair)?: None Help needed standing up from a chair using your arms (e.g., wheelchair or bedside chair)?: None Help needed to walk in hospital room?: None Help needed climbing 3-5 steps with a railing? : A Little 6 Click Score: 23    End of Session Equipment Utilized During Treatment: Gait belt Activity Tolerance: Patient tolerated treatment well Patient left: with call bell/phone within reach;in chair;with  family/visitor present Nurse Communication: Mobility status PT Visit Diagnosis: Muscle weakness (generalized) (M62.81);Other abnormalities of gait and mobility (R26.89);Difficulty in walking, not elsewhere classified (R26.2);Pain     Time: 1425-1441 PT Time Calculation (min) (ACUTE ONLY): 16 min  Charges:  $Gait Training: 8-22 mins                     Alben Deeds, PT DPT  Board Certified Neurologic Specialist [place] Pager [phone] Office [phone]    Tina Dominguez 12/17/2018, 2:58 PM

## 2018-12-17 NOTE — Care Management Important Message (Signed)
Important Message  Patient Details  Name: Tina Dominguez MRN: [id number] Date of Birth: 1940/12/31   Medicare Important Message Given:  Yes    Orbie Pyo 12/17/2018, 3:33 PM

## 2018-12-17 NOTE — Progress Notes (Signed)
Pt spontaneously converted into A Fib when she got up from the bed quickly.  Heart rate went as high as 167 bpm.  Pt could feel palpitations in her chest. Quickly gave pt IV metoprolol 5 mg and paged Dr. Prescott Gum.  The doctor said to go ahead and give the pt her morning pills of Amiodarone and Metoprolol and to put her on continuous IV Amiodarone at 30 mg/hr.  Check her vital signs every 30 minutes for two hours.  Lupita Dawn, RN

## 2018-12-17 NOTE — Progress Notes (Addendum)
      [address]       [place] [address]             [phone]        6 Days Post-Op Procedure(s) (LRB): CORONARY ARTERY BYPASS GRAFTING (CABG) x three , using left internal mammary artery, and right leg greater saphenous vein harvested endoscopically - SVG to OM1, SVG to Distal RCA (N/A) TRANSESOPHAGEAL ECHOCARDIOGRAM (TEE) (N/A)  Subjective: Patient without specific complaints this am.  Objective: Vital signs in last 24 hours: Temp:  [98 F (36.7 C)-98.4 F (36.9 C)] 98 F (36.7 C) (03/23 0541) Pulse Rate:  [66-78] 69 (03/23 0730) Cardiac Rhythm: Normal sinus rhythm (03/23 0709) Resp:  [17-23] 17 (03/23 0730) BP: (122-155)/(68-84) 122/74 (03/23 0730) SpO2:  [94 %-100 %] 96 % (03/23 0730) Weight:  [70.2 kg] 70.2 kg (03/23 0541)  Pre op weight  71.8 kg Current Weight  12/17/18 70.2 kg       Intake/Output from previous day: 03/22 0701 - 03/23 0700 In: 630 [P.O.:630] Out: 100 [Urine:100]   Physical Exam:  Cardiovascular: RRR Pulmonary: Slightly diminished at bases Abdomen: Soft, non tender, bowel sounds present. Extremities: Mild bilateral lower extremity edema. Wounds: Clean and dry.  No erythema or signs of infection.  Lab Results: CBC: Recent Labs    12/16/18 0612 12/17/18 0233  WBC 9.9 8.8  HGB 10.6* 9.1*  HCT 33.3* 29.0*  PLT 410* 351   BMET:  Recent Labs    12/16/18 0612 12/17/18 0233  NA 140 139  K 4.1 3.3*  CL 103 104  CO2 25 25  GLUCOSE 145* 95  BUN 17 13  CREATININE 0.98 0.82  CALCIUM 9.0 8.6*    PT/INR:  Lab Results  Component Value Date   INR 1.4 (H) 12/11/2018   INR 1.0 12/10/2018   INR 1.0 12/10/2018   ABG:  INR: Will add last result for INR, ABG once components are confirmed Will add last 4 CBG results once components are confirmed  Assessment/Plan:  1. CV - SR. She went into a fib with RVR earlier this am so put on Amiodarone drip at 30 mg /hr. On Amlodipine 5 mg daily, Lopressor 12.5 mg bid.  She converted to SR quickly so will stop Amiodarone drip this afternoon, and start low dose oral Amiodarone this evening. Unable to titrate Lopressor secondary to HR in the 60's. 2.  Pulmonary - On room air. Encourage incentive spirometer. 3.  Acute blood loss anemia - H and H this am 9.1 and 29. 4. Supplement potassium. Re check BMET in am 5. Hypothyroidism-on Levothyroxine 125 mcg daily 6. 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 Will decrease ecasa to 14  Donielle M ZimmermanPA-C 12/17/2018,7:41 AM [phone]  Brief afib this am back in sinus Home 1-2 days depending on afib, started back on anticoagulation I have seen and examined Lyda Kalata and agree with the above assessment  and plan.  Grace Isaac MD Beeper [phone] Office [phone] 12/17/2018 12:08 PM

## 2018-12-17 NOTE — Progress Notes (Signed)
CARDIAC REHAB PHASE I   PRE:  Rate/Rhythm: 57 SR  BP:  Sitting: 137/69      SaO2: 98 RA  MODE:  Ambulation: 320 ft   POST:  Rate/Rhythm: 90 SR  BP:  Sitting: 142/63    SaO2: 100 RA   Pt ambulated 384ft in hallway standby assist while pushing IV pole. Pt c/o SOB, hesitant to stop and rest in hallway. Pt to recliner after walk, call bell, phone and BSC within reach. Encouraged continued IS use, and walks. Will continue to follow.  [id number] Rufina Falco, RN BSN 12/17/2018 11:26 AM

## 2018-12-17 NOTE — Progress Notes (Signed)
   Recurrent AF this AM.  Plan oral amiodarone for 4-6 weeks to allow healing before DC.

## 2018-12-18 ENCOUNTER — Other Ambulatory Visit: Payer: Self-pay | Admitting: Physician Assistant

## 2018-12-18 LAB — GLUCOSE, CAPILLARY: Glucose-Capillary: 92 mg/dL (ref 70–99)

## 2018-12-18 LAB — LIPID PANEL
Cholesterol: 104 mg/dL (ref 0–200)
HDL: 35 mg/dL — ABNORMAL LOW (ref >40)
LDL Cholesterol: 44 mg/dL (ref 0–99)
Total CHOL/HDL Ratio: 3 RATIO
Triglycerides: 123 mg/dL (ref <150)
VLDL: 25 mg/dL (ref 0–40)

## 2018-12-18 LAB — BASIC METABOLIC PANEL
Anion gap: 5 (ref 5–15)
BUN: 17 mg/dL (ref 8–23)
CO2: 25 mmol/L (ref 22–32)
Calcium: 8.5 mg/dL — ABNORMAL LOW (ref 8.9–10.3)
Chloride: 107 mmol/L (ref 98–111)
Creatinine, Ser: 0.85 mg/dL (ref 0.44–1.00)
GFR calc Af Amer: >60 mL/min (ref >60)
GFR calc non Af Amer: >60 mL/min (ref >60)
Glucose, Bld: 106 mg/dL — ABNORMAL HIGH (ref 70–99)
Potassium: 5.2 mmol/L — ABNORMAL HIGH (ref 3.5–5.1)
Sodium: 137 mmol/L (ref 135–145)

## 2018-12-18 LAB — POTASSIUM: Potassium: 4.3 mmol/L (ref 3.5–5.1)

## 2018-12-18 MED ORDER — ASPIRIN 81 MG PO TBEC: 81.0000 mg | DELAYED_RELEASE_TABLET | Freq: Every day | ORAL | Status: DC

## 2018-12-18 MED ORDER — FUROSEMIDE 40 MG PO TABS
40.0000 mg | ORAL_TABLET | Freq: Every day | ORAL | Status: DC
  Administered 2018-12-18: 40 mg via ORAL
  Filled 2018-12-18: qty 1

## 2018-12-18 MED ORDER — LISINOPRIL 2.5 MG PO TABS
2.5000 mg | ORAL_TABLET | Freq: Every day | ORAL | Status: DC
  Administered 2018-12-18: 2.5 mg via ORAL
  Filled 2018-12-18: qty 1

## 2018-12-18 MED ORDER — LISINOPRIL 2.5 MG PO TABS: 2.5000 mg | ORAL_TABLET | Freq: Every day | ORAL | 1 refills | Status: DC

## 2018-12-18 MED ORDER — METOPROLOL SUCCINATE ER 25 MG PO TB24: 25.0000 mg | ORAL_TABLET | Freq: Every day | ORAL | 1 refills | Status: DC

## 2018-12-18 MED ORDER — ATORVASTATIN CALCIUM 40 MG PO TABS: 40.0000 mg | ORAL_TABLET | Freq: Every day | ORAL | 1 refills | Status: DC

## 2018-12-18 MED ORDER — AMIODARONE HCL 200 MG PO TABS: 200.0000 mg | ORAL_TABLET | Freq: Two times a day (BID) | ORAL | 1 refills | Status: DC

## 2018-12-18 MED ORDER — FUROSEMIDE 20 MG PO TABS: 20.0000 mg | ORAL_TABLET | Freq: Every day | ORAL | 0 refills | Status: DC

## 2018-12-18 NOTE — Progress Notes (Signed)
Progress Note  Patient Name: Tina Dominguez Date of Encounter: 12/18/2018  Primary Cardiologist: Shirlee More, MD   Subjective   Pt resting, ready to discharge.   Inpatient Medications    Scheduled Meds: . amiodarone  200 mg Oral BID  . amLODipine  5 mg Oral Daily  . aspirin EC  81 mg Oral Daily   Or  . aspirin  81 mg Per Tube Daily  . atorvastatin  40 mg Oral q1800  . bisacodyl  10 mg Oral Daily   Or  . bisacodyl  10 mg Rectal Daily  . Chlorhexidine Gluconate Cloth  6 each Topical Daily  . citalopram  20 mg Oral QHS  . docusate sodium  200 mg Oral Daily  . folic acid  1 mg Oral Daily  . furosemide  40 mg Oral Daily  . levothyroxine  125 mcg Oral QHS  . linaclotide  145 mcg Oral QAC breakfast  . lisinopril  2.5 mg Oral Daily  . mouth rinse  15 mL Mouth Rinse BID  . metoprolol tartrate  12.5 mg Oral BID   Or  . metoprolol tartrate  12.5 mg Per Tube BID  . pantoprazole  40 mg Oral Daily  . rivaroxaban  20 mg Oral Daily  . sodium chloride flush  10-40 mL Intracatheter Q12H  . sodium chloride flush  3 mL Intravenous Q12H   Continuous Infusions: . sodium chloride Stopped (12/11/18 2010)  . sodium chloride 250 mL (12/12/18 0505)  . sodium chloride 20 mL/hr at 12/11/18 1300  . sodium chloride     PRN Meds: sodium chloride, sodium chloride, metoprolol tartrate, ondansetron (ZOFRAN) IV, oxyCODONE, sodium chloride flush, sodium chloride flush, traMADol   Vital Signs    Vitals:   12/17/18 2039 12/18/18 0013 12/18/18 0514 12/18/18 0824  BP: (!) 149/66 125/71 (!) 148/73 128/64  Pulse: 77 60 62   Resp: 19 16 (!) 22 14  Temp: 98.3 F (36.8 C) 97.8 F (36.6 C) 98 F (36.7 C) 98.2 F (36.8 C)  TempSrc: Oral Oral Oral Oral  SpO2: 95% 95% 94%   Weight:   70.4 kg   Height:        Intake/Output Summary (Last 24 hours) at 12/18/2018 0834 Last data filed at 12/17/2018 2155 Gross per 24 hour  Intake 510 ml  Output 100 ml  Net 410 ml   Last 3 Weights 12/18/2018  12/17/2018 12/16/2018  Weight (lbs) 155 lb 3.3 oz 154 lb 11.2 oz 156 lb 3.2 oz  Weight (kg) 70.4 kg 70.171 kg 70.852 kg      Telemetry    Sinus rhythm, last Afib RVR noted on 12/17/18 at 0700 with spontaneous conversion shortly after - Personally Reviewed  ECG    Afib RVR 12/17/18 - Personally Reviewed  Physical Exam   GEN: No acute distress.   Neck: No JVD Cardiac: RRR, no murmurs, rubs, or gallops. Sternotomy incision closed C/D/I   Respiratory: Clear to auscultation bilaterally. GI: Soft, nontender, non-distended  MS: trace edema; No deformity. Neuro:  Nonfocal  Psych: Normal affect   Labs    Chemistry Recent Labs  Lab 12/16/18 0612 12/17/18 0233 12/18/18 0246  NA 140 139 137  K 4.1 3.3* 5.2*  CL 103 104 107  CO2 25 25 25   GLUCOSE 145* 95 106*  BUN 17 13 17   CREATININE 0.98 0.82 0.85  CALCIUM 9.0 8.6* 8.5*  GFRNONAA 56* >60 >60  GFRAA >60 >60 >60  ANIONGAP 12 10 5  Hematology Recent Labs  Lab 12/15/18 0405 12/16/18 0612 12/17/18 0233  WBC 9.9 9.9 8.8  RBC 2.92* 3.66* 3.21*  HGB 8.8* 10.6* 9.1*  HCT 27.0* 33.3* 29.0*  MCV 92.5 91.0 90.3  MCH 30.1 29.0 28.3  MCHC 32.6 31.8 31.4  RDW 13.2 13.0 12.7  PLT 259 410* 351    Cardiac EnzymesNo results for input(s): TROPONINI in the last 168 hours. No results for input(s): TROPIPOC in the last 168 hours.   BNPNo results for input(s): BNP, PROBNP in the last 168 hours.   DDimer No results for input(s): DDIMER in the last 168 hours.   Radiology    No results found.  Cardiac Studies   Left heart cath 12/10/18:  Ost LM to Mid LM lesion is 60% stenosed.  Ost RCA to Prox RCA lesion is 80% stenosed.  Mid RCA lesion is 99% stenosed.  Prox LAD lesion is 90% stenosed.  There is no aortic valve stenosis.  The left ventricular systolic function is normal.  LV end diastolic pressure is normal.  The left ventricular ejection fraction is 55-65% by visual estimate.  Tortuosity in the right subclavian  made catheter engagement difficult.  Would not use right radial approach.   Severe multivessel, calcific CAD.  Patient with rest pain at home.  Only received 1 dose of PLavix, 75 mg this AM since she was off of her Xarelto.  THis is not a chronic medicine for her so she would not have therapeutic antiplatelet inhibition.   Start IV heparin.  Cardiac surgery consult.   Patient Profile     78 y.o. female with crescendo angina found to have severe multivessel coronary artery disease, now postoperative day #7 from multivessel CABG.   Assessment & Plan    1. Multivessel CAD s/p CABG on 12/11/18: - CABG with LIMA to LAD, SVG to OM1, SVG to DISTAL RCA - right left SVG harvest site - on ASA, statin, BB - received IV lasix for post-op volume overload, now on PO lasix 40 mg daily - no further chest pain, expected chest wall soreness s/p CABG   2. 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 Hypertension  - medications include norvasc, lisinopril, lopressor - pressures have fluctuated - may need to increase lisinopril dose prior to discharge   5. Hyperlipidemia with LDL goal < 70 - will obtain fastin lipids   CARDIOLOGY RECOMMENDATIONS:  Discharge is anticipated in the next 48 hours. Recommendations for medications and follow up:  Discharge Medications: Continue medications as they are currently listed in the [facility]. Exceptions to the above:  Amiodarone 200 mg BID x 7 days then 200 mg daily  Defer to TCTS for increase in ACEI for pressure control  Follow Up: The patient's Primary Cardiologist is  Shirlee More, MD  Follow up in the office in 1 week(s).  Signed,  Tami Lin , PA  9:09 AM 12/18/2018  [facility]         For questions or updates, please contact [facility] Please consult [email or web address] for contact info  under        Signed, [place], PA  12/18/2018, 8:34 AM

## 2018-12-18 NOTE — Op Note (Signed)
NAME: Tina Dominguez, FREEMAN MEDICAL RECORD YW:[id number] ACCOUNT [id number] DATE OF BIRTH:1941-02-18 FACILITY: [facility] LOCATION: [facility] PHYSICIAN:Ymani Porcher Maryruth Bun, MD  OPERATIVE REPORT  DATE OF PROCEDURE:  12/11/2018  PREOPERATIVE DIAGNOSES:  Severe coronary occlusive disease with unstable angina.  POSTOPERATIVE DIAGNOSIS:  Severe coronary occlusive disease with unstable angina.  SURGICAL PROCEDURE:  Coronary artery bypass grafting x3 with the left internal mammary to the left anterior descending coronary artery, reverse saphenous vein graft to the obtuse marginal coronary artery, reverse saphenous vein graft to the distal right  coronary artery with right thigh and calf endoscopic vein harvesting of the right greater saphenous vein.  SURGEON:  Lanelle Bal, MD  FIRST ASSISTANT:  Lars Pinks, PA.  BRIEF HISTORY:  The patient is a 78 year old patient with unstable anginal symptoms who had undergone cardiac catheterization the day prior and was found to have severe 3-vessel coronary artery disease including complex high-grade stenosis of the right  coronary artery and also involving the proximal LAD.  The circumflex was a smaller system with disease.  Overall, ventricular function was preserved.  Because of the patient's significant 3-vessel coronary artery disease not amenable to angioplasty,  coronary artery bypass grafting was recommended to the patient who agreed and signed informed consent.  DESCRIPTION OF PROCEDURE:  With Swan-Ganz and arterial line monitors in place, the patient underwent general endotracheal anesthesia without incident.  Skin of the chest and legs was prepped with Betadine, draped in the usual sterile manner.  TEE probe  was placed by Anesthesia.  After appropriate timeout, we proceeded with endoscopic vein harvesting of the right greater saphenous vein endoscopically.  Median sternotomy was performed.  Left internal mammary artery was dissected down as a  pedicle graft.   The distal artery was divided and had good free flow.  Pericardium was opened.  Overall, ventricular function appeared preserved.  The patient was systemically heparinized.  The ascending aorta was cannulated.  The right atrium was cannulated.  An  aortic root vent cardioplegia needle was introduced into the ascending aorta.  The patient was placed on cardiopulmonary bypass 2.4 liters per minute per meter square.  Sites of anastomosis were dissected out of the epicardium.  The patient's body  temperature was cooled to 32 degrees.  Aortic crossclamp was applied and 500 mL of cold blood potassium cardioplegia was administered with diastolic arrest of the heart.  Myocardial septal temperatures monitored throughout the crossclamp.  The  endoscopically harvested vein was of excellent quality.  We turned our attention first to the distal right coronary artery, which was opened and admitted 1.5 mm probe.  Using a running 7-0 Prolene, distal anastomosis was performed with a segment of  reverse saphenous vein graft.  Additional cold blood cardioplegia was administered down the vein grafts.  The heart was then elevated and the first obtuse marginal vessel was relatively small.  The vessel was opened and admitted a 1.5 mm probe.  Using a  running 7-0 Prolene, distal anastomosis was performed.  We then turned our attention to the left anterior descending coronary artery and the midportion of the LAD was partially intramyocardial.  The vessel was dissected out of its intramyocardial  position and was of good quality and caliber.  It admitted a 1.5 mm probe distally.  Using a running 8-0 Prolene, the left internal mammary artery was anastomosed to left anterior descending coronary artery.  With cross clamp still in place, 2 punch  aortotomies were performed and each of the 2 vein grafts were anastomosed to  the ascending aorta.  The bulldog was removed from the mammary artery with prompt rise in myocardial  septal temperature.   The heart was allowed to passively fill and deair and the proximal anastomoses were completed.  Aortic crossclamp was then removed with a total crossclamp time of 80 minutes.  The patient spontaneously converted to a sinus rhythm.  Sites of anastomosis  were inspected and were free of bleeding.  Atrial and ventricular pacing wires were applied.  The patient's body temperature was rewarmed to 37 degrees.  She was then ventilated and weaned from cardiopulmonary bypass without difficulty.  She remained  hemodynamically stable, was decannulated in the usual fashion.  Protamine sulfate was administered.  With operative field hemostatic,  a left pleural tube and a Blake mediastinal drain were left in place.  Pericardium was loosely reapproximated.  Sternum  was closed with 6 stainless steel wire.  Fascia was closed with interrupted 0 Vicryl, running 3-0 Vicryl in subcutaneous tissue, 4-0 subcuticular stitch in the skin edges.  Dry dressings were applied.    Sponge and needle count was reported as correct at completion of the procedure.  Clear code was reported with  RF tag scanning.    The patient was transferred to the Surgical Intensive Care Unit for further postoperative care in stable condition.    Total pump time was 109 minutes.    While on cardiopulmonary bypass, unit of packed red blood cells was administered for a low hematocrit preoperatively and while on bypass.  AN/NUANCE  D:12/18/2018 T:12/18/2018 JOB:[id number]

## 2018-12-18 NOTE — Progress Notes (Signed)
Physical Therapy Treatment Patient Details Name: Tina Dominguez MRN: [id number] DOB: Nov 13, 1940 Today's Date: 12/18/2018    History of Present Illness Pt is a 78 y.o. female s/p CABG x 3.     PT Comments    Patient seen for activity progression. Patient mobilizing at independent levels with no cues or assist. At this time, patient has met all goals for acute PT services. No further needs. Will sign off.   Follow Up Recommendations  Supervision/Assistance - 24 hour     Equipment Recommendations  None recommended by PT    Recommendations for Other Services       Precautions / Restrictions Precautions Precautions: Sternal Restrictions Weight Bearing Restrictions: Yes(sternal precautions) Other Position/Activity Restrictions: sternal precautions    Mobility  Bed Mobility Overal bed mobility: Modified Independent Bed Mobility: Supine to Sit              Transfers Overall transfer level: Independent Equipment used: None Transfers: Sit to/from Stand              Ambulation/Gait Ambulation/Gait assistance: Independent Social research officer, government (Feet): 240 Feet Assistive device: None Gait Pattern/deviations: Step-through pattern;Decreased stride length;Trunk flexed Gait velocity: WFL Gait velocity interpretation: 1.31 - 2.62 ft/sec, indicative of limited community ambulator General Gait Details: steady with ambulation, reports she has been mobilizing on her own frequently   Marine scientist Rankin (Stroke Patients Only)       Balance Overall balance assessment: Needs assistance Sitting-balance support: No upper extremity supported;Feet supported Sitting balance-Leahy Scale: Good     Standing balance support: Bilateral upper extremity supported;During functional activity Standing balance-Leahy Scale: Good                              Cognition Arousal/Alertness: Awake/alert Behavior During Therapy: WFL  for tasks assessed/performed Overall Cognitive Status: Within Functional Limits for tasks assessed                                        Exercises      General Comments General comments (skin integrity, edema, etc.): VSS      Pertinent Vitals/Pain Pain Assessment: Faces Faces Pain Scale: Hurts a little bit Pain Location: generalized "arthritic" pain Pain Descriptors / Indicators: Discomfort Pain Intervention(s): Monitored during session    Home Living                      Prior Function            PT Goals (current goals can now be found in the care plan section) Acute Rehab PT Goals Patient Stated Goal: go home PT Goal Formulation: With patient/family Time For Goal Achievement: 12/27/18 Potential to Achieve Goals: Good Progress towards PT goals: Goals met/education completed, patient discharged from PT    Frequency    Min 3X/week      PT Plan Current plan remains appropriate    Co-evaluation              AM-PAC PT "6 Clicks" Mobility   Outcome Measure  Help needed turning from your back to your side while in a flat bed without using bedrails?: None Help needed moving from lying on your back to sitting on the side of a flat bed without  using bedrails?: None Help needed moving to and from a bed to a chair (including a wheelchair)?: None Help needed standing up from a chair using your arms (e.g., wheelchair or bedside chair)?: None Help needed to walk in hospital room?: None Help needed climbing 3-5 steps with a railing? : A Little 6 Click Score: 23    End of Session   Activity Tolerance: Patient tolerated treatment well Patient left: with call bell/phone within reach;in chair;with family/visitor present Nurse Communication: Mobility status PT Visit Diagnosis: Muscle weakness (generalized) (M62.81);Other abnormalities of gait and mobility (R26.89);Difficulty in walking, not elsewhere classified (R26.2);Pain     Time:  3646-8032 PT Time Calculation (min) (ACUTE ONLY): 12 min  Charges:  $Gait Training: 8-22 mins                     Alben Deeds, PT DPT  Board Certified Neurologic Specialist [place] Pager [phone] Office [phone]    Duncan Dull 12/18/2018, 9:52 AM

## 2018-12-18 NOTE — Progress Notes (Addendum)
      [address]       [place] [address]             [phone]        7 Days Post-Op Procedure(s) (LRB): CORONARY ARTERY BYPASS GRAFTING (CABG) x three , using left internal mammary artery, and right leg greater saphenous vein harvested endoscopically - SVG to OM1, SVG to Distal RCA (N/A) TRANSESOPHAGEAL ECHOCARDIOGRAM (TEE) (N/A)  Subjective: Patient without specific complaints this am. She hopes to go home.  Objective: Vital signs in last 24 hours: Temp:  [97.8 F (36.6 C)-98.4 F (36.9 C)] 98 F (36.7 C) (03/24 0514) Pulse Rate:  [59-77] 62 (03/24 0514) Cardiac Rhythm: Normal sinus rhythm (03/23 1903) Resp:  [11-22] 22 (03/24 0514) BP: (122-160)/(60-75) 148/73 (03/24 0514) SpO2:  [94 %-98 %] 94 % (03/24 0514) Weight:  [70.4 kg] 70.4 kg (03/24 0514)  Pre op weight  71.8 kg Current Weight  12/18/18 70.4 kg       Intake/Output from previous day: 03/23 0701 - 03/24 0700 In: 750 [P.O.:750] Out: 100 [Urine:100]   Physical Exam:  Cardiovascular: RRR Pulmonary: Slightly diminished at bases Abdomen: Soft, non tender, bowel sounds present. Extremities: No lower extremity edema. Wounds: Clean and dry.  No erythema or signs of infection.  Lab Results: CBC: Recent Labs    12/16/18 0612 12/17/18 0233  WBC 9.9 8.8  HGB 10.6* 9.1*  HCT 33.3* 29.0*  PLT 410* 351   BMET:  Recent Labs    12/17/18 0233 12/18/18 0246  NA 139 137  K 3.3* 5.2*  CL 104 107  CO2 25 25  GLUCOSE 95 106*  BUN 13 17  CREATININE 0.82 0.85  CALCIUM 8.6* 8.5*    PT/INR:  Lab Results  Component Value Date   INR 1.4 (H) 12/11/2018   INR 1.0 12/10/2018   INR 1.0 12/10/2018   ABG:  INR: Will add last result for INR, ABG once components are confirmed Will add last 4 CBG results once components are confirmed  Assessment/Plan:  1. CV - Previous brief a fib. Continues to maintain SR. On Amlodipine 5 mg daily, Lopressor 12.5 mg bid. Will start low dose  Lisinopril for better BP control. Unable to titrate Lopressor secondary to HR in the 60's. 2.  Pulmonary - On room air. Encourage incentive spirometer. 3.  Acute blood loss anemia - H and H this am 9.1 and 29. 4. Potassium this am up from 3.3 to 5.2? Will recheck around 11 am 5. Hypothyroidism-on Levothyroxine 125 mcg daily 6. 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  7. Will give Lasix 40 mg once this am to help decrease potassium and as discussed with Dr. Servando Snare, will continue Lasix 20 mg daily for 5 more days 8. Discharge early afternoon as long as potassium decreased;patient does not want HH or home PT  Donielle M ZimmermanPA-C 12/18/2018,7:18 AM [phone]  I have seen and examined Tina Dominguez and agree with the above assessment  and plan.  Grace Isaac MD Beeper [phone] Office [phone] 12/18/2018 12:28 PM

## 2018-12-18 NOTE — Progress Notes (Signed)
IV and telemetry discontinued at this time. CCMD notified. Discharge instructions reviewed with patient. All questions answered.   K. Starr Maelyn Berrey, RN 

## 2018-12-18 NOTE — Progress Notes (Signed)
Chest tube sutures removed at this time. Benzoin and steri strips applied. Mid sternal and right leg incisions painted with betadine. Patient tolerated well.   Emelda Fear, RN

## 2018-12-18 NOTE — Discharge Instructions (Signed)
Discharge Instructions:  1. You may shower, please wash incisions daily with soap and water and keep dry.  If you wish to cover wounds with dressing you may do so but please keep clean and change daily.  No tub baths or swimming until incisions have completely healed.  If your incisions become red or develop any drainage please call our office at [phone]  2. No Driving until cleared by Dr. Everrett Coombe office and you are no longer using narcotic pain medications  3. Monitor your weight daily.. Please use the same scale and weigh at same time... If you gain 5-10 lbs in 48 hours with associated lower extremity swelling, please contact our office at [phone]  4. Fever of 101.5 for at least 24 hours with no source, please contact our office at [phone]  5. Activity- up as tolerated, please walk at least 3 times per day.  Avoid strenuous activity, no lifting, pushing, or pulling with your arms over 8-10 lbs for a minimum of 6 weeks  6. If any questions or concerns arise, please do not hesitate to contact our office at [phone]   Coronary Artery Bypass Grafting, Care After This sheet gives you information about how to care for yourself after your procedure. Your doctor may also give you more specific instructions. If you have problems or questions, contact your doctor. Follow these instructions at home: Medicines  Take over-the-counter and prescription medicines only as told by your doctor. Do not stop taking medicines or start any new medicines unless your doctor says it is okay.  If you were prescribed an antibiotic medicine, take it as told by your doctor. Do not stop taking the antibiotic even if you start to feel better.  Do not drive or use heavy machinery while taking prescription pain medicine. Incision care      Follow instructions from your doctor about how to take care of your incisions. Make sure you: ? Wash your hands with soap and water before you change your  bandage (dressing). If you cannot use soap and water, use hand sanitizer. ? Change your dressing as told by your doctor. ? Leave stitches (sutures), skin glue, or skin tape (adhesive) strips in place. They may need to stay in place for 2 weeks or longer. If tape strips get loose and curl up, you may trim the loose edges. Do not remove tape strips completely unless your doctor says it is okay.  Make sure the incisions are clean, dry, and protected.  Check your incision areas every day for signs of infection. Check for: ? Redness, swelling, or pain. ? Fluid or blood. ? Warmth. ? Pus or a bad smell.  If cuts were made in your legs: ? Avoid crossing your legs. ? Avoid sitting for long periods of time. Change positions every 30 minutes. ? Raise (elevate) your legs when you are sitting. Bathing  Do not take baths, swim, or use a hot tub until your doctor says it is okay.  Only take sponge baths. Pat the incisions dry. Do not rub the incisions to dry.  Ask your doctor when you can shower. Eating and drinking  Eat foods that are high in fiber, such as raw fruits and vegetables, whole grains, beans, and nuts. Meats should be lean cut. Avoid canned, processed, and fried foods. This can help prevent constipation. This is also a recommended part of a heart-healthy diet.  Drink enough fluid to keep your urine clear or pale yellow.  Limit alcohol intake to no  more than 1 drink a day for nonpregnant women and 2 drinks a day for men. One drink equals 12 oz of beer, 5 oz of wine, or 1 oz of hard liquor. Activity  Rest and limit your activity as told by your doctor. You may be told to: ? Stop any activity right away if you have chest pain, shortness of breath, irregular heartbeats, or dizziness. Get help right away if you have any of these symptoms. ? Move around often for short periods or take short walks as told by your doctor. Slowly increase your activities. You may need physical therapy or  cardiac rehabilitation. ? Avoid lifting, pushing, or pulling anything that is heavier than 10 lb (4.5 kg) for at least 6 weeks or as told by your doctor.  Do not drive until your doctor says it is okay.  Ask your doctor when you can go back to work.  Ask your doctor when you can be sexually active. General instructions   Do not use any products that contain nicotine or tobacco, such as cigarettes and e-cigarettes. If you need help quitting, ask your doctor.  Take 2-3 deep breaths every few hours during the day while you get better. This helps expand your lungs and prevent complications.  If you were given a device called an incentive spirometer, use it several times a day to practice deep breathing. Support your chest with a pillow or your arms when you take deep breaths or cough.  Wear compression stockings as told by your doctor. These stockings help to prevent blood clots and reduce swelling in your legs.  Weigh yourself every day. This helps to see if your body is holding (retaining) fluid that may make your heart and lungs work harder.  Keep all follow-up visits as told by your doctor. This is important. Contact a doctor if:  You have more redness, swelling, or pain around any cut.  You have more fluid or blood coming from any cut.  Any cut feels warm to the touch.  You have pus or a bad smell coming from any cut.  You have a fever.  You have swelling in your ankles or legs.  You have pain in your legs.  You gain 2 or more pounds (0.9 kg) a day.  You feel sick to your stomach (nauseous) or throw up (vomit).  You have watery poop (diarrhea). Get help right away if:  You have chest pain that goes to your jaw or arms.  You are short of breath.  You have a fast or irregular heartbeat.  You notice a "clicking" in your breastbone (sternum) when you move.  You feel numb or weak in your arms or legs.  You feel dizzy or light-headed. Summary  After the procedure,  it is common to have pain or discomfort in the incision areas.  Do not take baths, swim, or use a hot tub until your health care provider approves.  Slowly increase your activities. You may need physical therapy or cardiac rehabilitation.  Weigh yourself every day. This helps to see if your body is holding (retaining) fluid that may make your heart and lungs work harder. This information is not intended to replace advice given to you by your health care provider. Make sure you discuss any questions you have with your health care provider. Document Released: 09/17/2013 Document Revised: 10/25/2016 Document Reviewed: 10/25/2016 Elsevier Interactive Patient Education  2019 [employer].

## 2018-12-18 NOTE — Consult Note (Signed)
   [facility] Inpatient Consult   12/18/2018  NATHA GUIN 1940/12/05 [id number]  Patient screened for long length hospitalization to check if potential [facility] services are needed . Patient was hospitalized for CABG Patient has [employer] plan and her primary care provider is listed as   Raelyn Number, MD  Spoke with inpatient Transition of Care [facility] regarding patients potential needs.  She states patient has declined for home health and feels she has support needed for home. She did not anticipate any [facility] community care management needs.  Please place a [facility] Care Management consult or for questions contact:   Natividad Brood, RN BSN [facility] Liaison  [phone] business mobile phone Toll free office [phone]

## 2018-12-18 NOTE — Progress Notes (Addendum)
Pt has walked x2 today. Discussed sternal precautions, IS, mobility at home, diet, and CRPII. Will refer to [facility], although no order. She can get order through Dr. Bettina Gavia. [place], ACSM 11:45 AM 12/18/2018

## 2018-12-19 ENCOUNTER — Telehealth: Payer: Self-pay | Admitting: Cardiology

## 2018-12-19 NOTE — Telephone Encounter (Signed)
Attempted to contact patient with no answer, unable to leave voicemail. Will continue efforts to inform patient that appointment location has changed and to complete TOC call.

## 2018-12-19 NOTE — Telephone Encounter (Signed)
Several attempts to contact patient were not answered. Spoke with patient's daughter and informed of change to [facility] and that she would be contacted to either come to [facility] office or be a televisit which was explained.

## 2018-12-21 DIAGNOSIS — E782 Mixed hyperlipidemia: Secondary | ICD-10-CM | POA: Diagnosis not present

## 2018-12-21 DIAGNOSIS — Z7901 Long term (current) use of anticoagulants: Secondary | ICD-10-CM | POA: Diagnosis not present

## 2018-12-21 DIAGNOSIS — D6851 Activated protein C resistance: Secondary | ICD-10-CM | POA: Diagnosis not present

## 2018-12-21 DIAGNOSIS — I10 Essential (primary) hypertension: Secondary | ICD-10-CM | POA: Diagnosis not present

## 2018-12-24 ENCOUNTER — Telehealth: Payer: Self-pay

## 2018-12-24 NOTE — Telephone Encounter (Signed)
Virtual Visit Pre-Appointment Phone Call  Steps For Call:  1. Confirm consent - "In the setting of the current Covid19 crisis, you are scheduled for a (phone or video) visit with your provider on (date) at (time).  Just as we do with many in-office visits, in order for you to participate in this visit, we must obtain consent.  If you'd like, I can send this to your mychart (if signed up) or email for you to review.  Otherwise, I can obtain your verbal consent now.  All virtual visits are billed to your insurance company just like a normal visit would be.  By agreeing to a virtual visit, we'd like you to understand that the technology does not allow for your provider to perform an examination, and thus may limit your provider's ability to fully assess your condition.  Finally, though the technology is pretty good, we cannot assure that it will always work on either your or our end, and in the setting of a video visit, we may have to convert it to a phone-only visit.  In either situation, we cannot ensure that we have a secure connection.  Are you willing to proceed?"  2. Give patient instructions for WebEx download to smartphone as below if video visit  3. Advise patient to be prepared with any vital sign or heart rhythm information, their current medicines, and a piece of paper and pen handy for any instructions they may receive the day of their visit  4. Inform patient they will receive a phone call 15 minutes prior to their appointment time (may be from unknown caller ID) so they should be prepared to answer  5. Confirm that appointment type is correct in Epic appointment notes (video vs telephone)    TELEPHONE CALL NOTE  Tina Dominguez has been deemed a candidate for a follow-up tele-health visit to limit community exposure during the Covid-19 pandemic. I spoke with the patient via phone to ensure availability of phone/video source, confirm preferred email & phone number, and discuss  instructions and expectations.  I reminded Tina Dominguez to be prepared with any vital sign and/or heart rhythm information that could potentially be obtained via home monitoring, at the time of her visit. I reminded Tina Dominguez to expect a phone call at the time of her visit if her visit.  Did the patient verbally acknowledge consent to treatment? Spoke with Daughter (ok per DPR) and informed her of the virtual visit. Shelia (daurghter) stated that pt is aware of this appt and gives consent.   Orland Penman, CMA 12/24/2018 2:39 PM   DOWNLOADING THE [place], go to [employer] and type in WebEx in the search bar. [place] [employer], the blue/green circle. The app is free but as with any other app downloads, their phone may require them to verify saved payment information or Apple password. The patient does NOT have to create an account.  - If Android, ask patient to go to [employer] and type in WebEx in the search bar. [place] [employer], the blue/green circle. The app is free but as with any other app downloads, their phone may require them to verify saved payment information or Android password. The patient does NOT have to create an account.   CONSENT FOR TELE-HEALTH VISIT - PLEASE REVIEW  I hereby voluntarily request, consent and authorize [facility] and its employed or contracted physicians, Engineer, materials, nurse  practitioners or other licensed health care professionals (the Practitioner), to provide me with telemedicine health care services (the Services") as deemed necessary by the treating Practitioner. I acknowledge and consent to receive the Services by the Practitioner via telemedicine. I understand that the telemedicine visit will involve communicating with the Practitioner through live audiovisual communication technology and the disclosure of certain medical information by electronic transmission. I  acknowledge that I have been given the opportunity to request an in-person assessment or other available alternative prior to the telemedicine visit and am voluntarily participating in the telemedicine visit.  I understand that I have the right to withhold or withdraw my consent to the use of telemedicine in the course of my care at any time, without affecting my right to future care or treatment, and that the Practitioner or I may terminate the telemedicine visit at any time. I understand that I have the right to inspect all information obtained and/or recorded in the course of the telemedicine visit and may receive copies of available information for a reasonable fee.  I understand that some of the potential risks of receiving the Services via telemedicine include:   Delay or interruption in medical evaluation due to technological equipment failure or disruption;  Information transmitted may not be sufficient (e.g. poor resolution of images) to allow for appropriate medical decision making by the Practitioner; and/or   In rare instances, security protocols could fail, causing a breach of personal health information.  Furthermore, I acknowledge that it is my responsibility to provide information about my medical history, conditions and care that is complete and accurate to the best of my ability. I acknowledge that Practitioner's advice, recommendations, and/or decision may be based on factors not within their control, such as incomplete or inaccurate data provided by me or distortions of diagnostic images or specimens that may result from electronic transmissions. I understand that the practice of medicine is not an exact science and that Practitioner makes no warranties or guarantees regarding treatment outcomes. I acknowledge that I will receive a copy of this consent concurrently upon execution via email to the email address I last provided but may also request a printed copy by calling the office of  [place].    I understand that my insurance will be billed for this visit.   I have read or had this consent read to me.  I understand the contents of this consent, which adequately explains the benefits and risks of the Services being provided via telemedicine.   I have been provided ample opportunity to ask questions regarding this consent and the Services and have had my questions answered to my satisfaction.  I give my informed consent for the services to be provided through the use of telemedicine in my medical care  By participating in this telemedicine visit I agree to the above.         Cardiac Questionnaire:    Since your last visit or hospitalization:    1. Have you been having new or worsening chest pain? No   2. Have you been having new or worsening shortness of breath? No 3. Have you been having new or worsening leg swelling, wt gain, or increase in abdominal girth (pants fitting more tightly)? No   4. Have you had any passing out spells? No    *A YES to any of these questions would result in the appointment being kept. *If all the answers to these questions are NO, we should indicate that given the  current situation regarding the worldwide coronarvirus pandemic, at the recommendation of the CDC, we are looking to limit gatherings in our waiting area, and thus will reschedule their appointment beyond four weeks from today.   _____________   COVID-19 Pre-Screening Questions:   Do you currently have a fever? NO  Have you recently travelled on a cruise, internationally, or to [place], [place], [place], [place], [place], or [place], [place] [employer]) ? NO  Have you been in contact with someone that is currently pending confirmation of Covid19 testing or has been confirmed to have the [place] virus? NO  Are you currently experiencing fatigue or cough? NO

## 2018-12-28 ENCOUNTER — Encounter: Payer: Self-pay | Admitting: Cardiology

## 2018-12-28 ENCOUNTER — Telehealth: Payer: PPO | Admitting: Cardiology

## 2018-12-28 ENCOUNTER — Telehealth (INDEPENDENT_AMBULATORY_CARE_PROVIDER_SITE_OTHER): Payer: PPO | Admitting: Cardiology

## 2018-12-28 VITALS — BP 126/67 | HR 76

## 2018-12-28 DIAGNOSIS — E782 Mixed hyperlipidemia: Secondary | ICD-10-CM | POA: Diagnosis not present

## 2018-12-28 DIAGNOSIS — I480 Paroxysmal atrial fibrillation: Secondary | ICD-10-CM | POA: Diagnosis not present

## 2018-12-28 DIAGNOSIS — D500 Iron deficiency anemia secondary to blood loss (chronic): Secondary | ICD-10-CM | POA: Diagnosis not present

## 2018-12-28 DIAGNOSIS — Z7901 Long term (current) use of anticoagulants: Secondary | ICD-10-CM

## 2018-12-28 DIAGNOSIS — Z79899 Other long term (current) drug therapy: Secondary | ICD-10-CM

## 2018-12-28 DIAGNOSIS — I10 Essential (primary) hypertension: Secondary | ICD-10-CM

## 2018-12-28 DIAGNOSIS — I25118 Atherosclerotic heart disease of native coronary artery with other forms of angina pectoris: Secondary | ICD-10-CM

## 2018-12-28 DIAGNOSIS — Z951 Presence of aortocoronary bypass graft: Secondary | ICD-10-CM

## 2018-12-28 DIAGNOSIS — I6523 Occlusion and stenosis of bilateral carotid arteries: Secondary | ICD-10-CM | POA: Diagnosis not present

## 2018-12-28 DIAGNOSIS — Z7189 Other specified counseling: Secondary | ICD-10-CM | POA: Diagnosis not present

## 2018-12-28 NOTE — Patient Instructions (Addendum)
Medication Instructions:  Your physician recommends that you continue on your current medications as directed. Please refer to the Current Medication list given to you today.  If you need a refill on your cardiac medications before your next appointment, please call your pharmacy.   Lab work: Your physician recommends that you return for lab work on Tuesday, 01/01/2019, in the [place] office: TSH, CMP, CBC. No appointment needed.   If you have labs (blood work) drawn today and your tests are completely normal, you will receive your results only by: Marland Kitchen MyChart Message (if you have MyChart) OR . A paper copy in the mail If you have any lab test that is abnormal or we need to change your treatment, we will call you to review the results.  Testing/Procedures: None  Follow-Up: At [facility], you and your health needs are our priority.  As part of our continuing mission to provide you with exceptional heart care, we have created designated Provider Care Teams.  These Care Teams include your primary Cardiologist (physician) and Advanced Practice Providers (APPs -  Physician Assistants and Nurse Practitioners) who all work together to provide you with the care you need, when you need it. You will need a follow up appointment in 1 months: Thursday, 02/21/2019, at 9:00 am in the [place] office.

## 2018-12-28 NOTE — Progress Notes (Signed)
Virtual Visit via Telephone Note    Evaluation Performed:  Follow-up visit  This visit type was conducted due to national recommendations for restrictions regarding the COVID-19 Pandemic (e.g. social distancing).  This format is felt to be most appropriate for this patient at this time.  All issues noted in this document were discussed and addressed.  No physical exam was performed (except for noted visual exam findings with Video Visits).  Please refer to the patient's chart (MyChart message for video visits and phone note for telephone visits) for the patient's consent to telehealth for [facility].  Date:  12/28/2018   ID:  Tina Dominguez, DOB 27-Feb-1941, MRN [id number]  Patient Location:  Home  Provider location:   [place]  PCP:  Raelyn Number, MD  Cardiologist:  Shirlee More, MD Dr. Bettina Gavia Electrophysiologist:  None   Chief Complaint: I had recent bypass surgery and I think I am doing very well  History of Present Illness:    Tina Dominguez is a 78 y.o. female who presents via audio/video conferencing for a telehealth visit today.    The patient does not have symptoms concerning for COVID-19 infection (fever, chills, cough, or new shortness of breath).   Tina Dominguez is a 78 y.o. female with a hx of hypertension, hyperlipidemia RCEA 2012 with recurrent stenosis and stroke  last seen 11/25/18.  Other problems include palpitation with documentation of atrial tachycardia but not fibrillation and a history of venous thromboembolism and upper GI bleed due to ulcer disease.  She was having a pattern of accelerated angina with rest episodes and abnormal myocardial perfusion study intermediate risk and underwent coronary angiography.  She is found to have left main and multivessel CAD and underwent coronary bypass surgery.  Carotid duplex before surgery showed 60 to 79% stenosis right internal carotid artery and 40 to 59% stenosis left internal carotid artery.  Operative  atrial fibrillation spontaneously converted and was placed on amiodarone with an expected duration of 6 to 12 weeks and is anticoagulated with Xarelto.  She was today 200 mg twice daily 7 days and then 200 mg daily.  Her hemoglobin prior to discharge was 9.1.  She is quite proud that even at the time of hospital discharge she felt capable of caring for self she is not having sternal pain shortness of breath edema palpitation syncope or bleeding from combined antiplatelet anticoagulant.  She has had a little bit of pink bruising from the chest tube site but not the sternum is drawn greater than 1000 cc on incentive spirometry is active in her home and able to dress and bathe on her own.  Reluctantly she has a family member staying with her for 2 weeks.  She is screening heart rate and blood pressure at home and has had no alarms for irregular heart rhythm on her digital device.  She has had no fever cough sputum or shortness of breath.  She is a very high degree of healthcare literacy.  She is very appreciative for her care and the way that it was streamlined and performed quickly before community spread of COVID-19 entered our hospital Procedures:    LEFT HEART CATH AND CORONARY ANGIOGRAPHY 12/10/18  Conclusion       Ost LM to Mid LM lesion is 60% stenosed.  Ost RCA to Prox RCA lesion is 80% stenosed.  Mid RCA lesion is 99% stenosed.  Prox LAD lesion is 90% stenosed.  There is no aortic valve stenosis.  The left ventricular systolic function is normal.  LV end diastolic pressure is normal.  The left ventricular ejection fraction is 55-65% by visual estimate.   CORONARY ARTERY BYPASS GRAFTING (CABG) x 3 (LIMA to LAD, SVG to OM1, SVG to DISTAL RCA)  using left internal mammary artery, and right leg greater saphenous vein harvested endoscopically by Dr. Servando Snare on 12/11/2018   Prior CV studies:   The following studies were reviewed today:  I reviewed her coronary angiogram bypass surgery  carotid duplex labs and discharge summary.  Past Medical History:  Diagnosis Date  . Aftercare following surgery of the circulatory system, [place] 10/14/2013  . Anemia 12/13/2017  . Anxiety 12/13/2017  . Arthritis   . Carotid artery occlusion   . Clotting disorder ([place])   . Coronary artery disease   . Depression   . Dysphagia 04/17/2017  . GERD (gastroesophageal reflux disease) 05/26/2011  . GI bleeding 10/04/2017  . Gout 12/13/2017  . Hiatal hernia 12/13/2017  . 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   Marland Kitchen History of recurrent TIAs 12/13/2017  . History of right-sided carotid endarterectomy 10/10/2011  . Hyperlipidemia   . Hypertension   . Leg pain    with walking  . Occlusion and stenosis of carotid artery without mention of cerebral infarction 10/08/2012  . Osteoarthritis 05/26/2011  . Osteoporosis   . PE (pulmonary embolism) 05/26/2011  . 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[place]) 04/17/2017  . Reflux   . Renal failure 12/13/2017  . S/P IVC filter 12/13/2017  . Schatzki's ring 12/13/2017  . Stroke [facility])    X's 4  . Valvular heart disease 12/13/2017   Past Surgical History:  Procedure Laterality Date  . ABDOMINAL HYSTERECTOMY  1980   TAH   . APPENDECTOMY    . BACK SURGERY  06/2011  . BREAST SURGERY     x2  . CAROTID ENDARTERECTOMY  11/11/2010   Right CEA  . CORONARY ARTERY BYPASS GRAFT N/A 12/11/2018   Procedure: CORONARY ARTERY BYPASS GRAFTING (CABG) x three , using left internal mammary artery, and right leg greater saphenous vein harvested endoscopically - SVG to OM1, SVG to Distal RCA;  Surgeon: Grace Isaac, MD;  Location: [place];  Service: Open Heart Surgery;  Laterality: N/A;  . IVC FILTER INSERTION    . JOINT REPLACEMENT Right 2005   Right Total Knee  . JOINT REPLACEMENT Left 2005   Left Total Knee  . LEFT HEART CATH AND CORONARY ANGIOGRAPHY N/A 12/10/2018   Procedure: LEFT HEART CATH  AND CORONARY ANGIOGRAPHY;  Surgeon: Jettie Booze, MD;  Location: [facility];  Service: Cardiovascular;  Laterality: N/A;  . SHOULDER SURGERY     right  . SPINE SURGERY  07-11-11   Decomp. Laminectomy, fusion of L4-5,L5-S1 by Dr. Saintclair Halsted  . TEE WITHOUT CARDIOVERSION N/A 12/11/2018   Procedure: TRANSESOPHAGEAL ECHOCARDIOGRAM (TEE);  Surgeon: Grace Isaac, MD;  Location: [place] [facility];  Service: Open Heart Surgery;  Laterality: N/A;  . TOTAL KNEE ARTHROPLASTY     right  and left     Current Meds  Medication Sig  . acetaminophen (TYLENOL) 500 MG tablet Take 500-1,000 mg by mouth every 6 (six) hours as needed for moderate pain.   Marland Kitchen amiodarone (PACERONE) 200 MG tablet Take 1 tablet (200 mg total) by mouth 2 (two) times daily. For one week then take 200 mg by mouth  daily thereafter  . amLODipine (NORVASC) 5 MG tablet Take 1 tablet (5 mg total) by mouth daily.  Marland Kitchen aspirin EC 81 MG EC tablet Take 1 tablet (81 mg total) by mouth daily.  Marland Kitchen atorvastatin (LIPITOR) 40 MG tablet Take 1 tablet (40 mg total) by mouth daily at 6 PM.  . citalopram (CELEXA) 10 MG tablet Take 10 mg by mouth at bedtime. Take with 40 mg to equal 50 mg at bedtime  . citalopram (CELEXA) 40 MG tablet Take 40 mg by mouth daily. Take with 10 mg to equal 50 mg at bedtime  . levothyroxine (SYNTHROID, LEVOTHROID) 125 MCG tablet Take 125 mcg by mouth at bedtime.   Marland Kitchen linaclotide (LINZESS) 145 MCG CAPS capsule Take 145 mcg by mouth every other day.   . lisinopril (PRINIVIL,ZESTRIL) 2.5 MG tablet Take 1 tablet (2.5 mg total) by mouth daily.  . metoprolol succinate (TOPROL-XL) 25 MG 24 hr tablet Take 1 tablet (25 mg total) by mouth daily.  . mirtazapine (REMERON) 30 MG tablet Take 30 mg by mouth at bedtime.   . Multiple Vitamin (MULTIVITAMIN) tablet Take 1 tablet by mouth at bedtime.   . Oxycodone HCl 10 MG TABS Take 10 mg by mouth every 6 (six) hours as needed (pain).   . pantoprazole (PROTONIX) 40 MG tablet TAKE 1 TABLET(40 MG) BY  MOUTH TWICE DAILY  . XARELTO 20 MG TABS tablet Take 20 mg by mouth daily.     Allergies:   Patient has no known allergies.   Social History   Tobacco Use  . Smoking status: Never Smoker  . Smokeless tobacco: Never Used  Substance Use Topics  . Alcohol use: No  . Drug use: No     Family Hx: The patient's family history includes Clotting disorder in her mother; Deep vein thrombosis in her mother and sister; Diabetes in her mother and sister; Heart disease in her brother and mother; Heart disease (age of onset: 68) in her father; Hyperlipidemia in her mother; Hypertension in her mother; Stroke in her maternal grandmother; Varicose Veins in her father.  ROS:   Please see the history of present illness.     All other systems reviewed and are negative.   Labs/Other Tests and Data Reviewed:    Recent Labs: 12/10/2018: ALT 11 12/13/2018: TSH 0.178 12/14/2018: Magnesium 1.9 12/17/2018: Hemoglobin 9.1; Platelets 351 12/18/2018: BUN 17; Creatinine, Ser 0.85; Potassium 4.3; Sodium 137   Recent Lipid Panel Lab Results  Component Value Date/Time   CHOL 104 12/18/2018 02:46 AM   TRIG 123 12/18/2018 02:46 AM   HDL 35 (L) 12/18/2018 02:46 AM   CHOLHDL 3.0 12/18/2018 02:46 AM   LDLCALC 44 12/18/2018 02:46 AM    Wt Readings from Last 3 Encounters:  12/18/18 155 lb 3.3 oz (70.4 kg)  12/05/18 160 lb 6.4 oz (72.8 kg)  12/04/18 158 lb (71.7 kg)     Objective:    Vital Signs:  There were no vitals taken for this visit.   Oriented articulate female in no acute distress.   ASSESSMENT & PLAN:    1.  CAD, stable after recent bypass surgery having no anginal discomfort she will continue her current treatment which includes combined low-dose aspirin anticoagulant lipid-lowering therapy and beta-blocker.  Asked her to gradually increase activities to she is back to normal and sadly with social distancing her cardiac rehabilitation program is on hold as soon as possible I had like to see her to  enroll.  I will plan to  see her physically in my office in 6 weeks 2.  Paroxysmal atrial fibrillation she is on low-dose amiodarone continue she will come the office early next week check her thyroid CMP CBC and when I see her in the office I will tend to withdraw amiodarone I asked her to continue to screen her heart rate and blood pressure daily 3.  On amiodarone therapy continue with the intent to withdrawal at office follow-up 4.  Chronic anticoagulation continue she has a history of venous thromboembolism and paroxysmal atrial fibrillation she will continue her PPI with a history of ulcer disease. 5.  Hypertension stable continue current treatment including ACE inhibitor beta-blocker 6.  Hyperlipidemia stable check lipid profile CMP next week 7.  Carotid stenosis bilateral need a follow-up duplex in the office 6 months 7.  Anemia blood loss postoperative I suspect that her hemoglobin will be improved and I will check a CBC early next week   COVID-19 Education: The signs and symptoms of COVID-19 were discussed with the patient and how to seek care for testing (follow up with PCP or arrange E-visit).  The importance of social distancing was discussed today.  Patient Risk:   After full review of this patient's clinical status, I feel that they are at least moderate risk at this time.  Time:   Today, I have spent 26 minutes with the patient with telehealth technology discussing CAD her hospital course her very favorable improvement medical therapy for hypertension hyperlipidemia anticoagulation atrial fibrillation and need for labs next week in face-to-face office follow-up after the peak of COVID-19.     Medication Adjustments/Labs and Tests Ordered: Current medicines are reviewed at length with the patient today.  Concerns regarding medicines are outlined above.  Tests Ordered: No orders of the defined types were placed in this encounter.  Medication Changes: No orders of the defined  types were placed in this encounter.   Disposition:  Follow up 6 weeks office  Signed, Shirlee More, MD  12/28/2018 10:06 AM    [place]

## 2019-01-01 DIAGNOSIS — I10 Essential (primary) hypertension: Secondary | ICD-10-CM | POA: Diagnosis not present

## 2019-01-01 DIAGNOSIS — R0602 Shortness of breath: Secondary | ICD-10-CM | POA: Diagnosis not present

## 2019-01-01 DIAGNOSIS — I25118 Atherosclerotic heart disease of native coronary artery with other forms of angina pectoris: Secondary | ICD-10-CM | POA: Diagnosis not present

## 2019-01-01 DIAGNOSIS — M1990 Unspecified osteoarthritis, unspecified site: Secondary | ICD-10-CM | POA: Diagnosis not present

## 2019-01-01 DIAGNOSIS — E7800 Pure hypercholesterolemia, unspecified: Secondary | ICD-10-CM | POA: Diagnosis not present

## 2019-01-01 DIAGNOSIS — R5383 Other fatigue: Secondary | ICD-10-CM | POA: Diagnosis not present

## 2019-01-01 DIAGNOSIS — Z0000 Encounter for general adult medical examination without abnormal findings: Secondary | ICD-10-CM | POA: Diagnosis not present

## 2019-01-01 DIAGNOSIS — E782 Mixed hyperlipidemia: Secondary | ICD-10-CM | POA: Diagnosis not present

## 2019-01-01 DIAGNOSIS — Z993 Dependence on wheelchair: Secondary | ICD-10-CM | POA: Diagnosis not present

## 2019-01-01 DIAGNOSIS — Z79899 Other long term (current) drug therapy: Secondary | ICD-10-CM | POA: Diagnosis not present

## 2019-01-01 DIAGNOSIS — R569 Unspecified convulsions: Secondary | ICD-10-CM | POA: Diagnosis not present

## 2019-01-01 DIAGNOSIS — Z9861 Coronary angioplasty status: Secondary | ICD-10-CM | POA: Diagnosis not present

## 2019-01-01 DIAGNOSIS — M545 Low back pain: Secondary | ICD-10-CM | POA: Diagnosis not present

## 2019-01-01 DIAGNOSIS — F419 Anxiety disorder, unspecified: Secondary | ICD-10-CM | POA: Diagnosis not present

## 2019-01-01 DIAGNOSIS — E871 Hypo-osmolality and hyponatremia: Secondary | ICD-10-CM | POA: Diagnosis not present

## 2019-01-01 DIAGNOSIS — G809 Cerebral palsy, unspecified: Secondary | ICD-10-CM | POA: Diagnosis not present

## 2019-01-01 DIAGNOSIS — K219 Gastro-esophageal reflux disease without esophagitis: Secondary | ICD-10-CM | POA: Diagnosis not present

## 2019-01-01 DIAGNOSIS — Z7901 Long term (current) use of anticoagulants: Secondary | ICD-10-CM | POA: Diagnosis not present

## 2019-01-01 DIAGNOSIS — G8222 Paraplegia, incomplete: Secondary | ICD-10-CM | POA: Diagnosis not present

## 2019-01-01 DIAGNOSIS — I119 Hypertensive heart disease without heart failure: Secondary | ICD-10-CM | POA: Diagnosis not present

## 2019-01-01 DIAGNOSIS — Z5181 Encounter for therapeutic drug level monitoring: Secondary | ICD-10-CM | POA: Diagnosis not present

## 2019-01-01 DIAGNOSIS — D500 Iron deficiency anemia secondary to blood loss (chronic): Secondary | ICD-10-CM | POA: Diagnosis not present

## 2019-01-01 DIAGNOSIS — I480 Paroxysmal atrial fibrillation: Secondary | ICD-10-CM | POA: Diagnosis not present

## 2019-01-01 DIAGNOSIS — R251 Tremor, unspecified: Secondary | ICD-10-CM | POA: Diagnosis not present

## 2019-01-01 LAB — ECHO INTRAOPERATIVE TEE
AV Mean grad: 3 mmHg — NL
Ao-asc: 3.1 cm — NL
Height: 63 in — NL
STJ: 2.5 cm — NL
Sinus: 3 cm — NL
Weight: 2532.64 oz — NL

## 2019-01-02 LAB — COMPREHENSIVE METABOLIC PANEL
ALT: 6 IU/L (ref 0–32)
AST: 13 IU/L (ref 0–40)
Albumin/Globulin Ratio: 1.5 (ref 1.2–2.2)
Albumin: 3.4 g/dL — ABNORMAL LOW (ref 3.7–4.7)
Alkaline Phosphatase: 103 IU/L (ref 39–117)
BUN/Creatinine Ratio: 15 (ref 12–28)
BUN: 16 mg/dL (ref 8–27)
Bilirubin Total: 0.3 mg/dL (ref 0.0–1.2)
CO2: 21 mmol/L (ref 20–29)
Calcium: 8.8 mg/dL (ref 8.7–10.3)
Chloride: 111 mmol/L — ABNORMAL HIGH (ref 96–106)
Creatinine, Ser: 1.04 mg/dL — ABNORMAL HIGH (ref 0.57–1.00)
GFR calc Af Amer: 61 mL/min/{1.73_m2} (ref >59)
GFR calc non Af Amer: 53 mL/min/{1.73_m2} — ABNORMAL LOW (ref >59)
Globulin, Total: 2.3 g/dL (ref 1.5–4.5)
Glucose: 106 mg/dL — ABNORMAL HIGH (ref 65–99)
Potassium: 3.7 mmol/L (ref 3.5–5.2)
Sodium: 145 mmol/L — ABNORMAL HIGH (ref 134–144)
Total Protein: 5.7 g/dL — ABNORMAL LOW (ref 6.0–8.5)

## 2019-01-02 LAB — TSH: TSH: 5.13 u[IU]/mL — ABNORMAL HIGH (ref 0.450–4.500)

## 2019-01-02 LAB — CBC
Hematocrit: 28.9 % — ABNORMAL LOW (ref 34.0–46.6)
Hemoglobin: 9.7 g/dL — ABNORMAL LOW (ref 11.1–15.9)
MCH: 30.4 pg (ref 26.6–33.0)
MCHC: 33.6 g/dL (ref 31.5–35.7)
MCV: 91 fL (ref 79–97)
Platelets: 494 10*3/uL — ABNORMAL HIGH (ref 150–450)
RBC: 3.19 x10E6/uL — ABNORMAL LOW (ref 3.77–5.28)
RDW: 13.3 % (ref 11.7–15.4)
WBC: 7.8 10*3/uL (ref 3.4–10.8)

## 2019-01-07 ENCOUNTER — Telehealth: Payer: Self-pay | Admitting: Physician Assistant

## 2019-01-07 NOTE — Telephone Encounter (Signed)
[address]       [place] [address]             [place]  [id number]      Today, I had the pleasure of speaking with Ms. Mcdade 3 weeks status post CABG x 3 to see how the patient's post-operative period was going.   Overall, the patient is doing very well.   1. Incisions-no drainage or redness 2. No fevers 3. Any symptoms to note? She is a little short of breath with ambulation but otherwise no changes.  4. Ambulation? Several times a day without issue.  5. Review restrictions with the patient. Keep to 5-10 lb with the upper body this week and you can increase next week slowly. She already has heard from cardiac rehab and does not want to participate.  6. Medications-no changes.   Current Outpatient Medications on File Prior to Visit  Medication Sig Dispense Refill  . acetaminophen (TYLENOL) 500 MG tablet Take 500-1,000 mg by mouth every 6 (six) hours as needed for moderate pain.     Marland Kitchen amiodarone (PACERONE) 200 MG tablet Take 1 tablet (200 mg total) by mouth 2 (two) times daily. For one week then take 200 mg by mouth daily thereafter 60 tablet 1  . amLODipine (NORVASC) 5 MG tablet Take 1 tablet (5 mg total) by mouth daily. 90 tablet 3  . aspirin EC 81 MG EC tablet Take 1 tablet (81 mg total) by mouth daily.    Marland Kitchen atorvastatin (LIPITOR) 40 MG tablet Take 1 tablet (40 mg total) by mouth daily at 6 PM. 30 tablet 1  . citalopram (CELEXA) 10 MG tablet Take 10 mg by mouth at bedtime. Take with 40 mg to equal 50 mg at bedtime    . citalopram (CELEXA) 40 MG tablet Take 40 mg by mouth daily. Take with 10 mg to equal 50 mg at bedtime    . levothyroxine (SYNTHROID, LEVOTHROID) 125 MCG tablet Take 125 mcg by mouth at bedtime.   0  . linaclotide (LINZESS) 145 MCG CAPS capsule Take 145 mcg by mouth every other day.     . lisinopril (PRINIVIL,ZESTRIL) 2.5 MG tablet Take 1 tablet (2.5 mg total) by mouth daily. 30 tablet 1  . metoprolol succinate  (TOPROL-XL) 25 MG 24 hr tablet Take 1 tablet (25 mg total) by mouth daily. 30 tablet 1  . mirtazapine (REMERON) 30 MG tablet Take 30 mg by mouth at bedtime.     . Multiple Vitamin (MULTIVITAMIN) tablet Take 1 tablet by mouth at bedtime.     . Oxycodone HCl 10 MG TABS Take 10 mg by mouth every 6 (six) hours as needed (pain).     . pantoprazole (PROTONIX) 40 MG tablet TAKE 1 TABLET(40 MG) BY MOUTH TWICE DAILY 180 tablet 0  . XARELTO 20 MG TABS tablet Take 20 mg by mouth daily.  0   No current facility-administered medications on file prior to visit.     I feel this patient is okay to no be seen next week and abide by the current COVID19 restrictions. Overall, her post-op period is going very well and it would not be worth the risk to bring her into the clinic.  Her appointment will be rescheduled in a few weeks. I will reach out to the office for rescheduling.    All questions were answered to the patient's satisfaction. The patient is aware that they may call  our office if new concerns arise.   Nicholes Rough, PA-C

## 2019-01-12 ENCOUNTER — Other Ambulatory Visit: Payer: Self-pay | Admitting: Physician Assistant

## 2019-01-14 ENCOUNTER — Ambulatory Visit: Payer: PPO

## 2019-01-16 ENCOUNTER — Ambulatory Visit: Payer: PPO | Admitting: Cardiology

## 2019-01-18 DIAGNOSIS — E782 Mixed hyperlipidemia: Secondary | ICD-10-CM | POA: Diagnosis not present

## 2019-01-18 DIAGNOSIS — D6851 Activated protein C resistance: Secondary | ICD-10-CM | POA: Diagnosis not present

## 2019-01-18 DIAGNOSIS — Z7901 Long term (current) use of anticoagulants: Secondary | ICD-10-CM | POA: Diagnosis not present

## 2019-01-18 DIAGNOSIS — I10 Essential (primary) hypertension: Secondary | ICD-10-CM | POA: Diagnosis not present

## 2019-01-21 ENCOUNTER — Telehealth: Payer: Self-pay | Admitting: Emergency Medicine

## 2019-01-21 NOTE — Telephone Encounter (Signed)
Patients son calling concerned about patient's legs. They have been swelling for over two weeks now and the patient has been having shortness of breath on exertion. Patient's son called due to her left foot becoming more swollen today. They are unsure of any weight gain. No other symptoms at this time. Dr. Agustin Cree aware. He advised for this to be routed to Dr. Bettina Gavia and his team can schedule for a virtual visit. I will route to Dr. Joya Gaskins team.

## 2019-01-22 NOTE — Progress Notes (Signed)
Virtual Visit via Telephone Note   This visit type was conducted due to national recommendations for restrictions regarding the COVID-19 Pandemic (e.g. social distancing) in an effort to limit this patient's exposure and mitigate transmission in our community.  Due to her co-morbid illnesses, this patient is at least at moderate risk for complications without adequate follow up.  This format is felt to be most appropriate for this patient at this time.  The patient did not have access to video technology/had technical difficulties with video requiring transitioning to audio format only (telephone).  All issues noted in this document were discussed and addressed.  No physical exam could be performed with this format.  Please refer to the patient's chart for her  consent to telehealth for [facility].   Evaluation Performed:  Follow-up visit  Date:  01/23/2019   ID:  Tina Dominguez, Tina Dominguez 16-Apr-1941, MRN [id number]  Patient Location: Home Provider Location: Home  PCP:  Raelyn Number, MD  Cardiologist:  Shirlee More, MD  Electrophysiologist:  None   Chief Complaint:  Edema and SOb  History of Present Illness:    VIVIANNA Dominguez is a 78 y.o. female with a hx of Tina Dominguez is a 78 y.o. female with a hx of hypertension, hyperlipidemia RCEA 2012 with recurrent stenosis and stroke  last seen 12/28/18 after CABG.A ZIO monitor done for palpitation also showed brief PAT episodes. A pre op carotid  Duplex showed mild bilateral carotid disease. She had post op PAF treated with amiodarone and is anticoagulated also with a history of VTE.  She was last seen 12/28/2018.  She is not doing as well her weight is up 5 pounds she is developed edema and now has shortness of breath when she is walking outdoors.  She appears to have congestive heart failure she will start a diuretic.  She takes a calcium channel blocker BP at target on decrease to every other day and look forward to discontinuing in  the future.  He follows heart rate and blood pressure at home and she has had no rapid or irregular heart rhythms with lumbar monitored digital device and we will stop amiodarone and do an EKG at her next office visit.  She is at risk for anemia check a CBC is at risk for liver dysfunction and hypothyroidism and will check those labs also although amiodarone is discontinued.  She has no fever or chills no cough.  No orthopnea chest pain palpitation or syncope.  CORONARY ARTERY BYPASS GRAFTING (CABG) x 3 (LIMA to LAD, SVG to OM1, SVG to DISTAL RCA)  using left internal mammary artery, and right leg greater saphenous vein harvested endoscopically by Dr. Servando Snare on 12/11/2018    The patient does not have symptoms concerning for COVID-19 infection (fever, chills, cough, or new shortness of breath).    Past Medical History:  Diagnosis Date  . Aftercare following surgery of the circulatory system, [place] 10/14/2013  . Anemia 12/13/2017  . Anxiety 12/13/2017  . Arthritis   . Carotid artery occlusion   . Clotting disorder ([place])   . Coronary artery disease   . Depression   . Dysphagia 04/17/2017  . GERD (gastroesophageal reflux disease) 05/26/2011  . GI bleeding 10/04/2017  . Gout 12/13/2017  . Hiatal hernia 12/13/2017  . 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   Marland Kitchen History of recurrent  TIAs 12/13/2017  . History of right-sided carotid endarterectomy 10/10/2011  . Hyperlipidemia   . Hypertension   . Leg pain    with walking  . Occlusion and stenosis of carotid artery without mention of cerebral infarction 10/08/2012  . Osteoarthritis 05/26/2011  . Osteoporosis   . PE (pulmonary embolism) 05/26/2011  . 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[place]) 04/17/2017  . Reflux   . Renal failure 12/13/2017  . S/P IVC filter 12/13/2017  . Schatzki's ring 12/13/2017  . Stroke [facility])    X's 4  . Valvular heart disease 12/13/2017    Past Surgical History:  Procedure Laterality Date  . ABDOMINAL HYSTERECTOMY  1980   TAH   . APPENDECTOMY    . BACK SURGERY  06/2011  . BREAST SURGERY     x2  . CAROTID ENDARTERECTOMY  11/11/2010   Right CEA  . CORONARY ARTERY BYPASS GRAFT N/A 12/11/2018   Procedure: CORONARY ARTERY BYPASS GRAFTING (CABG) x three , using left internal mammary artery, and right leg greater saphenous vein harvested endoscopically - SVG to OM1, SVG to Distal RCA;  Surgeon: Grace Isaac, MD;  Location: [place];  Service: Open Heart Surgery;  Laterality: N/A;  . IVC FILTER INSERTION    . JOINT REPLACEMENT Right 2005   Right Total Knee  . JOINT REPLACEMENT Left 2005   Left Total Knee  . LEFT HEART CATH AND CORONARY ANGIOGRAPHY N/A 12/10/2018   Procedure: LEFT HEART CATH AND CORONARY ANGIOGRAPHY;  Surgeon: Jettie Booze, MD;  Location: [place] CV LAB;  Service: Cardiovascular;  Laterality: N/A;  . SHOULDER SURGERY     right  . SPINE SURGERY  07-11-11   Decomp. Laminectomy, fusion of L4-5,L5-S1 by Dr. Saintclair Halsted  . TEE WITHOUT CARDIOVERSION N/A 12/11/2018   Procedure: TRANSESOPHAGEAL ECHOCARDIOGRAM (TEE);  Surgeon: Grace Isaac, MD;  Location: [place];  Service: Open Heart Surgery;  Laterality: N/A;  . TOTAL KNEE ARTHROPLASTY     right  and left     Current Meds  Medication Sig  . acetaminophen (TYLENOL) 500 MG tablet Take 500-1,000 mg by mouth every 6 (six) hours as needed for moderate pain.   Marland Kitchen amiodarone (PACERONE) 200 MG tablet Take 200 mg by mouth daily.  Marland Kitchen amLODipine (NORVASC) 5 MG tablet Take 1 tablet (5 mg total) by mouth daily.  Marland Kitchen aspirin EC 81 MG EC tablet Take 1 tablet (81 mg total) by mouth daily.  Marland Kitchen atorvastatin (LIPITOR) 40 MG tablet TAKE 1 TABLET BY MOUTH EVERY DAY AT 6PM  . citalopram (CELEXA) 10 MG tablet Take 10 mg by mouth at bedtime. Take with 40 mg to equal 50 mg at bedtime  . citalopram (CELEXA) 40 MG tablet Take 40 mg by mouth daily. Take with 10 mg to equal 50 mg at  bedtime  . levothyroxine (SYNTHROID, LEVOTHROID) 125 MCG tablet Take 125 mcg by mouth at bedtime.   Marland Kitchen linaclotide (LINZESS) 145 MCG CAPS capsule Take 145 mcg by mouth every other day.   . lisinopril (ZESTRIL) 2.5 MG tablet TAKE 1 TABLET BY MOUTH EVERY DAY  . metoprolol succinate (TOPROL-XL) 25 MG 24 hr tablet Take 1 tablet (25 mg total) by mouth daily.  . mirtazapine (REMERON) 30 MG tablet Take 30 mg by mouth at bedtime.   . Multiple Vitamin (MULTIVITAMIN) tablet Take 1 tablet by mouth at bedtime.   . Oxycodone HCl 10 MG TABS Take 10 mg by mouth every 6 (six) hours as needed (pain).   . pantoprazole (PROTONIX)  40 MG tablet TAKE 1 TABLET(40 MG) BY MOUTH TWICE DAILY  . XARELTO 20 MG TABS tablet Take 20 mg by mouth daily.     Allergies:   Patient has no known allergies.   Social History   Tobacco Use  . Smoking status: Never Smoker  . Smokeless tobacco: Never Used  Substance Use Topics  . Alcohol use: No  . Drug use: No     Family Hx: The patient's family history includes Clotting disorder in her mother; Deep vein thrombosis in her mother and sister; Diabetes in her mother and sister; Heart disease in her brother and mother; Heart disease (age of onset: 55) in her father; Hyperlipidemia in her mother; Hypertension in her mother; Stroke in her maternal grandmother; Varicose Veins in her father.  ROS:   Please see the history of present illness.     All other systems reviewed and are negative.   Prior CV studies:   The following studies were reviewed today:  Labs/Other Tests and Data Reviewed:    EKG:  No ECG reviewed.  Recent Labs: 12/14/2018: Magnesium 1.9 01/01/2019: ALT 6; BUN 16; Creatinine, Ser 1.04; Hemoglobin 9.7; Platelets 494; Potassium 3.7; Sodium 145; TSH 5.130   Recent Lipid Panel Lab Results  Component Value Date/Time   CHOL 104 12/18/2018 02:46 AM   TRIG 123 12/18/2018 02:46 AM   HDL 35 (L) 12/18/2018 02:46 AM   CHOLHDL 3.0 12/18/2018 02:46 AM   LDLCALC 44  12/18/2018 02:46 AM    Wt Readings from Last 3 Encounters:  01/23/19 165 lb (74.8 kg)  12/18/18 155 lb 3.3 oz (70.4 kg)  12/05/18 160 lb 6.4 oz (72.8 kg)     Objective:    Vital Signs:  BP (!) 120/58 (BP Location: Right Arm, Patient Position: Sitting)   Pulse 70   Ht 5\' 3"  (1.6 m)   Wt 165 lb (74.8 kg)   BMI 29.23 kg/m    VITAL SIGNS:  reviewed her mood and affect thought and cognition are normal she is alert and oriented x3 and she has no audible wheezing or respiratory distress during conversation.  Unfortunately she has no access to Internet or video phone.  ASSESSMENT & PLAN:    1. Coronary artery disease, stable after bypass surgery I would continue aspirin for 3 full months then discontinue and remain on her anticoagulant along with current treatment and lipid-lowering.  Once reopened should be referred to cardiac rehabilitation. 2. Atrial fibrillation paroxysmal stable we will plan on stopping amiodarone today and do an EKG in the office next week and continue anticoagulation.  With her edema and shortness of breath showed liver function and TSH checked in the office. 3. Discontinue amiodarone 4. Continue anticoagulation with previous venous thromboembolism and paroxysmal atrial fibrillation, 3 months after bypass surgery discontinue aspirin. 5. Hypertensive heart disease blood pressures at target decrease calcium channel blocker and look to stop and follow-up in 1 week if BP is at target.  Clinically I suspect she has heart failure with weight gain shortness of breath and edema will start on a low dose of loop diuretic check labs next week including renal function potassium proBNP level and check CBC as she was anemic postoperatively.  She will require a face-to-face office visit 1 week 6. Hyperlipidemia poorly controlled we will address lipid-lowering therapy next week continue statin check lipids at that time  COVID-19 Education: The signs and symptoms of COVID-19 were  discussed with the patient and how to seek care for testing (  follow up with PCP or arrange E-visit).  The importance of social distancing was discussed today.  Time:   Today, I have spent 23 minutes with the patient with telehealth technology discussing the above problems.     Medication Adjustments/Labs and Tests Ordered: Current medicines are reviewed at length with the patient today.  Concerns regarding medicines are outlined above.   Tests Ordered: No orders of the defined types were placed in this encounter.   Medication Changes: No orders of the defined types were placed in this encounter.   Disposition:  Follow up in 1 week(s)  Signed, Shirlee More, MD  01/23/2019 11:49 AM    [facility]

## 2019-01-22 NOTE — Telephone Encounter (Signed)
Patient has been scheduled for a telephone visit with Dr. Bettina Gavia tomorrow, 01/23/2019, at 11:30 am to evaluate symptoms further. Patient is agreeable and verbalized understanding.

## 2019-01-23 ENCOUNTER — Telehealth (INDEPENDENT_AMBULATORY_CARE_PROVIDER_SITE_OTHER): Payer: PPO | Admitting: Cardiology

## 2019-01-23 ENCOUNTER — Encounter: Payer: Self-pay | Admitting: Cardiology

## 2019-01-23 ENCOUNTER — Other Ambulatory Visit: Payer: Self-pay

## 2019-01-23 VITALS — BP 120/58 | HR 70 | Ht 63.0 in | Wt 165.0 lb

## 2019-01-23 DIAGNOSIS — R0602 Shortness of breath: Secondary | ICD-10-CM

## 2019-01-23 DIAGNOSIS — I25118 Atherosclerotic heart disease of native coronary artery with other forms of angina pectoris: Secondary | ICD-10-CM

## 2019-01-23 DIAGNOSIS — I480 Paroxysmal atrial fibrillation: Secondary | ICD-10-CM

## 2019-01-23 DIAGNOSIS — Z7901 Long term (current) use of anticoagulants: Secondary | ICD-10-CM

## 2019-01-23 DIAGNOSIS — Z79899 Other long term (current) drug therapy: Secondary | ICD-10-CM

## 2019-01-23 DIAGNOSIS — E782 Mixed hyperlipidemia: Secondary | ICD-10-CM

## 2019-01-23 DIAGNOSIS — I119 Hypertensive heart disease without heart failure: Secondary | ICD-10-CM

## 2019-01-23 DIAGNOSIS — R609 Edema, unspecified: Secondary | ICD-10-CM

## 2019-01-23 MED ORDER — AMLODIPINE BESYLATE 5 MG PO TABS: ORAL_TABLET | ORAL | 3 refills | Status: DC

## 2019-01-23 MED ORDER — FUROSEMIDE 20 MG PO TABS: ORAL_TABLET | ORAL | 1 refills | Status: DC

## 2019-01-23 NOTE — Patient Instructions (Addendum)
Medication Instructions:  Your physician has recommended you make the following change in your medication:   START furosemide (lasix) 20 mg: Take 1 tablet daily for the first 3 days then take 1 tablet every other day.   DECREASE amlodipine (norvasc) 5 mg: Take 1 tablet (5 mg) daily every other day.   **Please alternate the two above medications where you are taking furosemide one day then amlodipine the next day and continue to repeat this pattern.   STOP amiodarone  If you need a refill on your cardiac medications before your next appointment, please call your pharmacy.   Lab work: Your physician recommends that you return for lab work during office visit on Tuesday, 01/29/2019: CMP, CBC, TSH, ProBNP. No need to fast beforehand.   If you have labs (blood work) drawn today and your tests are completely normal, you will receive your results only by: Marland Kitchen MyChart Message (if you have MyChart) OR . A paper copy in the mail If you have any lab test that is abnormal or we need to change your treatment, we will call you to review the results.  Testing/Procedures: None  Follow-Up: At [facility], you and your health needs are our priority.  As part of our continuing mission to provide you with exceptional heart care, we have created designated Provider Care Teams.  These Care Teams include your primary Cardiologist (physician) and Advanced Practice Providers (APPs -  Physician Assistants and Nurse Practitioners) who all work together to provide you with the care you need, when you need it. . You will need a follow up appointment in 1 weeks: Tuesday, 01/29/2019, at 1:15 pm in the [place] office. Please arrive at 1:00 pm.   Any Other Special Instructions Will Be Listed Below (If Applicable).  **Continue to weigh yourself daily at the same time every day and record these readings. Bring this log with you to your next appointment for Dr. Bettina Gavia to review.

## 2019-01-28 NOTE — Progress Notes (Signed)
In office visit  Date:  01/29/2019   ID:  KERSTI SCAVONE, DOB Apr 30, 1941, MRN [id number]  PCP:  Raelyn Number, MD  Cardiologist:  Shirlee More, MD  Electrophysiologist:  None   Evaluation Performed:  Follow-Up Visit  Chief Complaint:  Heart failure, amiodarone was recently discontinued  History of Present Illness:    Tina Dominguez is a 79 y.o. female with a hx of Tina Dominguez is a 78 y.o. female with a hx of hypertension, hyperlipidemia RCEA 2012 with recurrent stenosis and stroke  last seen 12/28/18 after CABG.A ZIO monitor done for palpitation also showed brief PAT episodes. A pre op carotid  Duplex showed mild bilateral carotid disease. She had post op PAF treated with amiodarone and is anticoagulated also with a history of VTE.  She was last seen 01/23/2019 and her amiodarone was discontinued.  She also had heart failure and was placed on a loop diuretic.  She is improved with diuresis her weight at home is down in the range of 6 to 8 pounds.  Her peripheral edema and shortness of breath is resolved she feels well except she is just vaguely lightheaded not postural and it may be due to amiodarone which we discontinued at her last visit.  She has had no palpitation chest pain or syncope and she will remain on long-term anticoagulation with her known venous thromboembolism and paroxysmal atrial fibrillation.  In my office today we will recheck renal function potassium and proBNP level and if it significantly elevated we will do an echocardiogram prior to the next visit otherwise I will plan to do it after she is seen in the office 3 months.  I told her I thought her lightheadedness should improve off amiodarone  The patient does not have symptoms concerning for COVID-19 infection (fever, chills, cough, or new shortness of breath).    Past Medical History:  Diagnosis Date  . Aftercare following surgery of the circulatory system, [place] 10/14/2013  . Anemia 12/13/2017  . Anxiety  12/13/2017  . Arthritis   . Carotid artery occlusion   . Clotting disorder ([place])   . Coronary artery disease   . Depression   . Dysphagia 04/17/2017  . GERD (gastroesophageal reflux disease) 05/26/2011  . GI bleeding 10/04/2017  . Gout 12/13/2017  . Hiatal hernia 12/13/2017  . 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   Marland Kitchen History of recurrent TIAs 12/13/2017  . History of right-sided carotid endarterectomy 10/10/2011  . Hyperlipidemia   . Hypertension   . Leg pain    with walking  . Occlusion and stenosis of carotid artery without mention of cerebral infarction 10/08/2012  . Osteoarthritis 05/26/2011  . Osteoporosis   . PE (pulmonary embolism) 05/26/2011  . 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[place]) 04/17/2017  . Reflux   . Renal failure 12/13/2017  . S/P IVC filter 12/13/2017  . Schatzki's ring 12/13/2017  . Stroke [facility])    X's 4  . Valvular heart disease 12/13/2017   Past Surgical History:  Procedure Laterality Date  . ABDOMINAL HYSTERECTOMY  1980   TAH   . APPENDECTOMY    . BACK SURGERY  06/2011  . BREAST SURGERY     x2  . CAROTID ENDARTERECTOMY  11/11/2010   Right CEA  . CORONARY ARTERY BYPASS GRAFT N/A 12/11/2018   Procedure: CORONARY ARTERY BYPASS GRAFTING (CABG) x three , using left internal mammary  artery, and right leg greater saphenous vein harvested endoscopically - SVG to OM1, SVG to Distal RCA;  Surgeon: Grace Isaac, MD;  Location: [place];  Service: Open Heart Surgery;  Laterality: N/A;  . IVC FILTER INSERTION    . JOINT REPLACEMENT Right 2005   Right Total Knee  . JOINT REPLACEMENT Left 2005   Left Total Knee  . LEFT HEART CATH AND CORONARY ANGIOGRAPHY N/A 12/10/2018   Procedure: LEFT HEART CATH AND CORONARY ANGIOGRAPHY;  Surgeon: Jettie Booze, MD;  Location: [place] CV LAB;  Service: Cardiovascular;  Laterality: N/A;  . SHOULDER SURGERY     right  . SPINE SURGERY   07-11-11   Decomp. Laminectomy, fusion of L4-5,L5-S1 by Dr. Saintclair Halsted  . TEE WITHOUT CARDIOVERSION N/A 12/11/2018   Procedure: TRANSESOPHAGEAL ECHOCARDIOGRAM (TEE);  Surgeon: Grace Isaac, MD;  Location: [place];  Service: Open Heart Surgery;  Laterality: N/A;  . TOTAL KNEE ARTHROPLASTY     right  and left     Current Meds  Medication Sig  . acetaminophen (TYLENOL) 500 MG tablet Take 500-1,000 mg by mouth every 6 (six) hours as needed for moderate pain.   Marland Kitchen amiodarone (PACERONE) 200 MG tablet Take 200 mg by mouth daily.  Marland Kitchen amLODipine (NORVASC) 5 MG tablet Take 1 tablet (5 mg) daily every other day.  Marland Kitchen aspirin EC 81 MG EC tablet Take 1 tablet (81 mg total) by mouth daily.  Marland Kitchen atorvastatin (LIPITOR) 40 MG tablet TAKE 1 TABLET BY MOUTH EVERY DAY AT 6PM  . citalopram (CELEXA) 10 MG tablet Take 10 mg by mouth at bedtime. Take with 40 mg to equal 50 mg at bedtime  . citalopram (CELEXA) 40 MG tablet Take 40 mg by mouth daily. Take with 10 mg to equal 50 mg at bedtime  . furosemide (LASIX) 20 MG tablet Take 20 mg by mouth every other day.  . levothyroxine (SYNTHROID, LEVOTHROID) 125 MCG tablet Take 125 mcg by mouth at bedtime.   Marland Kitchen linaclotide (LINZESS) 145 MCG CAPS capsule Take 145 mcg by mouth every other day.   . lisinopril (ZESTRIL) 2.5 MG tablet TAKE 1 TABLET BY MOUTH EVERY DAY  . metoprolol succinate (TOPROL-XL) 25 MG 24 hr tablet Take 1 tablet (25 mg total) by mouth daily.  . mirtazapine (REMERON) 30 MG tablet Take 30 mg by mouth at bedtime.   . Multiple Vitamin (MULTIVITAMIN) tablet Take 1 tablet by mouth at bedtime.   . Oxycodone HCl 10 MG TABS Take 10 mg by mouth every 6 (six) hours as needed (pain).   . pantoprazole (PROTONIX) 40 MG tablet TAKE 1 TABLET(40 MG) BY MOUTH TWICE DAILY  . XARELTO 20 MG TABS tablet Take 20 mg by mouth daily.     Allergies:   Patient has no known allergies.   Social History   Tobacco Use  . Smoking status: Never Smoker  . Smokeless tobacco: Never Used   Substance Use Topics  . Alcohol use: No  . Drug use: No     Family Hx: The patient's family history includes Clotting disorder in her mother; Deep vein thrombosis in her mother and sister; Diabetes in her mother and sister; Heart disease in her brother and mother; Heart disease (age of onset: 36) in her father; Hyperlipidemia in her mother; Hypertension in her mother; Stroke in her maternal grandmother; Varicose Veins in her father.  ROS:   Please see the history of present illness.     All other systems reviewed and  are negative.   Prior CV studies:   The following studies were reviewed today:    Labs/Other Tests and Data Reviewed:    EKG:  An ECG dated today was personally reviewed today and demonstrated:  sinus rhythm and baseline artifact due to treatment  Recent Labs: 12/14/2018: Magnesium 1.9 01/01/2019: ALT 6; BUN 16; Creatinine, Ser 1.04; Hemoglobin 9.7; Platelets 494; Potassium 3.7; Sodium 145; TSH 5.130   Recent Lipid Panel Lab Results  Component Value Date/Time   CHOL 104 12/18/2018 02:46 AM   TRIG 123 12/18/2018 02:46 AM   HDL 35 (L) 12/18/2018 02:46 AM   CHOLHDL 3.0 12/18/2018 02:46 AM   LDLCALC 44 12/18/2018 02:46 AM    Wt Readings from Last 3 Encounters:  01/29/19 159 lb 6.4 oz (72.3 kg)  01/23/19 165 lb (74.8 kg)  12/18/18 155 lb 3.3 oz (70.4 kg)     Objective:    Vital Signs:  BP (!) 142/78 (BP Location: Right Arm, Patient Position: Sitting, Cuff Size: Normal)   Pulse (!) 58   Ht 5\' 3"  (1.6 m)   Wt 159 lb 6.4 oz (72.3 kg)   SpO2 97%   BMI 28.24 kg/m    Constitutional, well-nourished well-developed in no acute distress Vital signs reviewed Eyes, conjunctiva and sclera are normal without pallor or icterus extraocular motions intact and normal there is no lid lag Respiratory, normal effort and excursion no audible wheezing without a stethoscope Cardiovascular, mild neck vein distention , no peripheral edema Skin, no rash skin lesion or ulceration  of the extremities Neurologic, cranial nerves II to XII are grossly intact and the patient moves all 4 extremities Neuro/Psychiatric, judgment and thought processes are intact and coherent, alert and oriented x3, mood and affect appear normal. She has a mild resting tremor ASSESSMENT & PLAN:    1. Heart failure, hypertensive heart disease improved her heart failure is compensated no evidence of volume overload New York Heart Association class I continue her low-dose diuretic sodium restriction and home self-management.  We will recheck renal function at risk for renal failure hyperkalemia and a proBNP level as a guide for managing heart failure 2. CAD stable after CABG having no angina on current medical treatment she will continue her anticoagulant along with aspirin with recent bypass surgery medical therapy beta-blocker high intensity statin 3. Atrial fibrillation paroxysmal post CABG stable EKG shows sinus rhythm off amiodarone. 4. Chronic anticoagulation stable she will continue with her history of venous thromboembolism and atrial fibrillation  COVID-19 Education: The signs and symptoms of COVID-19 were discussed with the patient and how to seek care for testing (follow up with PCP or arrange E-visit).  The importance of social distancing was discussed today.  Time:   Today, I have spent 25 minutes with the patient with telehealth technology discussing the above problems.     Medication Adjustments/Labs and Tests Ordered: Current medicines are reviewed at length with the patient today.  Concerns regarding medicines are outlined above.   Tests Ordered: Orders Placed This Encounter  Procedures  . EKG 12-Lead    Medication Changes: No orders of the defined types were placed in this encounter.   Disposition:  Follow up in 3 month(s)  Signed, Shirlee More, MD  01/29/2019 1:40 PM    [facility]

## 2019-01-29 ENCOUNTER — Ambulatory Visit (INDEPENDENT_AMBULATORY_CARE_PROVIDER_SITE_OTHER): Payer: PPO | Admitting: Cardiology

## 2019-01-29 ENCOUNTER — Encounter: Payer: Self-pay | Admitting: Cardiology

## 2019-01-29 ENCOUNTER — Other Ambulatory Visit: Payer: Self-pay

## 2019-01-29 VITALS — BP 142/78 | HR 58 | Ht 63.0 in | Wt 159.4 lb

## 2019-01-29 DIAGNOSIS — E782 Mixed hyperlipidemia: Secondary | ICD-10-CM | POA: Diagnosis not present

## 2019-01-29 DIAGNOSIS — I480 Paroxysmal atrial fibrillation: Secondary | ICD-10-CM | POA: Diagnosis not present

## 2019-01-29 DIAGNOSIS — Z7901 Long term (current) use of anticoagulants: Secondary | ICD-10-CM

## 2019-01-29 DIAGNOSIS — Z79899 Other long term (current) drug therapy: Secondary | ICD-10-CM | POA: Diagnosis not present

## 2019-01-29 DIAGNOSIS — I110 Hypertensive heart disease with heart failure: Secondary | ICD-10-CM

## 2019-01-29 DIAGNOSIS — I25118 Atherosclerotic heart disease of native coronary artery with other forms of angina pectoris: Secondary | ICD-10-CM | POA: Diagnosis not present

## 2019-01-29 DIAGNOSIS — R0602 Shortness of breath: Secondary | ICD-10-CM | POA: Diagnosis not present

## 2019-01-29 DIAGNOSIS — R609 Edema, unspecified: Secondary | ICD-10-CM | POA: Diagnosis not present

## 2019-01-29 DIAGNOSIS — I119 Hypertensive heart disease without heart failure: Secondary | ICD-10-CM | POA: Diagnosis not present

## 2019-01-29 HISTORY — DX: Hypertensive heart disease with heart failure: I11.0

## 2019-01-29 NOTE — Patient Instructions (Signed)
Medication Instructions:  Your physician recommends that you continue on your current medications as directed. Please refer to the Current Medication list given to you today.  If you need a refill on your cardiac medications before your next appointment, please call your pharmacy.   Lab work: Your physician recommends that you return for lab work in: [place]  If you have labs (blood work) drawn today and your tests are completely normal, you will receive your results only by: Marland Kitchen MyChart Message (if you have MyChart) OR . A paper copy in the mail If you have any lab test that is abnormal or we need to change your treatment, we will call you to review the results.  Testing/Procedures: None  Follow-Up: At [facility], you and your health needs are our priority.  As part of our continuing mission to provide you with exceptional heart care, we have created designated Provider Care Teams.  These Care Teams include your primary Cardiologist (physician) and Advanced Practice Providers (APPs -  Physician Assistants and Nurse Practitioners) who all work together to provide you with the care you need, when you need it. You will need a follow up appointment in 3 months.   Any Other Special Instructions Will Be Listed Below (If Applicable).

## 2019-01-30 ENCOUNTER — Telehealth: Payer: Self-pay

## 2019-01-30 LAB — COMPREHENSIVE METABOLIC PANEL
ALT: 6 IU/L (ref 0–32)
AST: 16 IU/L (ref 0–40)
Albumin/Globulin Ratio: 1.7 (ref 1.2–2.2)
Albumin: 4.3 g/dL (ref 3.7–4.7)
Alkaline Phosphatase: 78 IU/L (ref 39–117)
BUN/Creatinine Ratio: 15 (ref 12–28)
BUN: 15 mg/dL (ref 8–27)
Bilirubin Total: 0.2 mg/dL (ref 0.0–1.2)
CO2: 24 mmol/L (ref 20–29)
Calcium: 9.2 mg/dL (ref 8.7–10.3)
Chloride: 103 mmol/L (ref 96–106)
Creatinine, Ser: 0.98 mg/dL (ref 0.57–1.00)
GFR calc Af Amer: 65 mL/min/{1.73_m2} (ref >59)
GFR calc non Af Amer: 57 mL/min/{1.73_m2} — ABNORMAL LOW (ref >59)
Globulin, Total: 2.5 g/dL (ref 1.5–4.5)
Glucose: 98 mg/dL (ref 65–99)
Potassium: 3.4 mmol/L — ABNORMAL LOW (ref 3.5–5.2)
Sodium: 147 mmol/L — ABNORMAL HIGH (ref 134–144)
Total Protein: 6.8 g/dL (ref 6.0–8.5)

## 2019-01-30 LAB — CBC
Hematocrit: 34.3 % (ref 34.0–46.6)
Hemoglobin: 11.3 g/dL (ref 11.1–15.9)
MCH: 31 pg (ref 26.6–33.0)
MCHC: 32.9 g/dL (ref 31.5–35.7)
MCV: 94 fL (ref 79–97)
Platelets: 419 10*3/uL (ref 150–450)
RBC: 3.64 x10E6/uL — ABNORMAL LOW (ref 3.77–5.28)
RDW: 13.9 % (ref 11.7–15.4)
WBC: 8.2 10*3/uL (ref 3.4–10.8)

## 2019-01-30 LAB — PRO B NATRIURETIC PEPTIDE: NT-Pro BNP: 1163 pg/mL — ABNORMAL HIGH (ref 0–738)

## 2019-01-30 LAB — TSH: TSH: 1.11 u[IU]/mL (ref 0.450–4.500)

## 2019-01-30 MED ORDER — POTASSIUM CHLORIDE CRYS ER 20 MEQ PO TBCR: 20.0000 meq | EXTENDED_RELEASE_TABLET | Freq: Every day | ORAL | 2 refills | Status: DC

## 2019-01-30 NOTE — Telephone Encounter (Signed)
Patient advised of lab results with low  Potassium.  Patient advised to start potassium 20 meq once daily and recheck BMP in 1 month.  Rx sent to pharmacy.  Patient agreed to plan and verbalized understanding.

## 2019-01-30 NOTE — Telephone Encounter (Signed)
-----   Message from Richardo Priest, MD sent at 01/30/2019  7:34 AM EDT ----- Normal or stable result  All good but K is low start k tab 20 meq daily #90 one daily refill 2 and recheck BMP 1 month

## 2019-02-07 ENCOUNTER — Telehealth: Payer: Self-pay | Admitting: Cardiology

## 2019-02-07 MED ORDER — ATORVASTATIN CALCIUM 40 MG PO TABS: ORAL_TABLET | ORAL | 1 refills | Status: DC

## 2019-02-07 NOTE — Telephone Encounter (Signed)
Refill sent in

## 2019-02-07 NOTE — Telephone Encounter (Signed)
°*  STAT* If patient is at the pharmacy, call can be transferred to refill team.   1. Which medications need to be refilled? (please list name of each medication and dose if known) Atorvastatin 40mg  tablet once daily  2. Which pharmacy/location (including street and city if local pharmacy) is medication to be sent to? [facility] on [address] in [place]  3. Do they need a 30 day or 90 day supply? [place]

## 2019-02-08 NOTE — Telephone Encounter (Signed)
Refill has to be for 90 days or insurance will not pay, can you please make this 90 days? Thanks

## 2019-02-11 MED ORDER — ATORVASTATIN CALCIUM 40 MG PO TABS: ORAL_TABLET | ORAL | 0 refills | Status: DC

## 2019-02-11 NOTE — Telephone Encounter (Signed)
Resent as 90 day.

## 2019-02-11 NOTE — Addendum Note (Signed)
Addended by: Aleatha Borer on: 02/11/2019 08:20 AM   Modules accepted: Orders

## 2019-02-13 ENCOUNTER — Other Ambulatory Visit: Payer: Self-pay

## 2019-02-13 ENCOUNTER — Other Ambulatory Visit: Payer: Self-pay | Admitting: Cardiology

## 2019-02-13 NOTE — Telephone Encounter (Signed)
°*  STAT* If patient is at the pharmacy, call can be transferred to refill team.   1. Which medications need to be refilled? (please list name of each medication and dose if known) Lisinopril 2.5mg  tablets once daily  2. Which pharmacy/location (including street and city if local pharmacy) is medication to be sent to?[facility] on [address] in [place]  3. Do they need a 30 day or 90 day supply? [place]

## 2019-02-14 MED ORDER — LISINOPRIL 2.5 MG PO TABS: 2.5000 mg | ORAL_TABLET | Freq: Every day | ORAL | 1 refills | Status: DC

## 2019-02-14 NOTE — Telephone Encounter (Signed)
Lisinopril 2.5 mg daily refilled.

## 2019-02-15 DIAGNOSIS — J441 Chronic obstructive pulmonary disease with (acute) exacerbation: Secondary | ICD-10-CM | POA: Diagnosis not present

## 2019-02-15 DIAGNOSIS — Z5181 Encounter for therapeutic drug level monitoring: Secondary | ICD-10-CM | POA: Diagnosis not present

## 2019-02-15 DIAGNOSIS — D6851 Activated protein C resistance: Secondary | ICD-10-CM | POA: Diagnosis not present

## 2019-02-15 DIAGNOSIS — I10 Essential (primary) hypertension: Secondary | ICD-10-CM | POA: Diagnosis not present

## 2019-02-15 DIAGNOSIS — E782 Mixed hyperlipidemia: Secondary | ICD-10-CM | POA: Diagnosis not present

## 2019-02-20 ENCOUNTER — Telehealth: Payer: Self-pay | Admitting: *Deleted

## 2019-02-20 NOTE — Telephone Encounter (Signed)
YOUR CARDIOLOGY TEAM HAS ARRANGED FOR AN E-VISIT FOR YOUR APPOINTMENT - PLEASE REVIEW IMPORTANT INFORMATION BELOW SEVERAL DAYS PRIOR TO YOUR APPOINTMENT  Due to the recent COVID-19 pandemic, we are transitioning in-person office visits to tele-medicine visits in an effort to decrease unnecessary exposure to our patients, their families, and staff. These visits are billed to your insurance just like a normal visit is. We also encourage you to sign up for MyChart if you have not already done so. You will need a smartphone if possible. For patients that do not have this, we can still complete the visit using a regular telephone but do prefer a smartphone to enable video when possible. You may have a family member that lives with you that can help. If possible, we also ask that you have a blood pressure cuff and scale at home to measure your blood pressure, heart rate and weight prior to your scheduled appointment. Patients with clinical needs that need an in-person evaluation and testing will still be able to come to the office if absolutely necessary. If you have any questions, feel free to call our office.     YOUR PROVIDER WILL BE USING THE FOLLOWING PLATFORM TO COMPLETE YOUR VISIT: Staff: Please delete this text and fill in MyChart/[email or web address]  . IF USING MYCHART - How to Download the MyChart App to Your SmartPhone   - If Apple, go to App Store and type in MyChart in the search bar and download the app. If Android, ask patient to go to Google Play Store and type in MyChart in the search bar and download the app. The app is free but as with any other app downloads, your phone may require you to verify saved payment information or Apple/Android password.  - You will need to then log into the app with your MyChart username and password, and select [facility] as your healthcare provider to link the account.  - When it is time for your visit, go to the MyChart app, find appointments, and click Begin  Video Visit. Be sure to Select Allow for your device to access the Microphone and Camera for your visit. You will then be connected, and your provider will be with you shortly.  **If you have any issues connecting or need assistance, please contact MyChart service desk [phone] ([phone])**  **If using a computer, in order to ensure the best quality for your visit, you will need to use either of the following Internet Browsers: Google Chrome or Microsoft Edge**  . IF USING DOXIMITY or [email or web address].ME - The staff will give you instructions on receiving your link to join the meeting the day of your visit.      2-3 DAYS BEFORE YOUR APPOINTMENT  You will receive a telephone call from one of our [facility] team members - your caller ID may say "Unknown caller." If this is a video visit, we will walk you through how to get the video launched on your phone. We will remind you check your blood pressure, heart rate and weight prior to your scheduled appointment. If you have an Apple Watch or Kardia, please upload any pertinent ECG strips the day before or morning of your appointment to MyChart. Our staff will also make sure you have reviewed the consent and agree to move forward with your scheduled tele-health visit.     THE DAY OF YOUR APPOINTMENT  Approximately 15 minutes prior to your scheduled appointment, you will receive a telephone call from one of HeartCare team -   your caller ID may say "Unknown caller."  Our staff will confirm medications, vital signs for the day and any symptoms you may be experiencing. Please have this information available prior to the time of visit start. It may also be helpful for you to have a pad of paper and pen handy for any instructions given during your visit. They will also walk you through joining the smartphone meeting if this is a video visit.    CONSENT FOR TELE-HEALTH VISIT - PLEASE REVIEW  I hereby voluntarily request, consent and authorize [facility] and  its employed or contracted physicians, physician assistants, nurse practitioners or other licensed health care professionals (the Practitioner), to provide me with telemedicine health care services (the "Services") as deemed necessary by the treating Practitioner. I acknowledge and consent to receive the Services by the Practitioner via telemedicine. I understand that the telemedicine visit will involve communicating with the Practitioner through live audiovisual communication technology and the disclosure of certain medical information by electronic transmission. I acknowledge that I have been given the opportunity to request an in-person assessment or other available alternative prior to the telemedicine visit and am voluntarily participating in the telemedicine visit.  I understand that I have the right to withhold or withdraw my consent to the use of telemedicine in the course of my care at any time, without affecting my right to future care or treatment, and that the Practitioner or I may terminate the telemedicine visit at any time. I understand that I have the right to inspect all information obtained and/or recorded in the course of the telemedicine visit and may receive copies of available information for a reasonable fee.  I understand that some of the potential risks of receiving the Services via telemedicine include:  Marland Kitchen Delay or interruption in medical evaluation due to technological equipment failure or disruption; . Information transmitted may not be sufficient (e.g. poor resolution of images) to allow for appropriate medical decision making by the Practitioner; and/or  . In rare instances, security protocols could fail, causing a breach of personal health information.  Furthermore, I acknowledge that it is my responsibility to provide information about my medical history, conditions and care that is complete and accurate to the best of my ability. I acknowledge that Practitioner's advice,  recommendations, and/or decision may be based on factors not within their control, such as incomplete or inaccurate data provided by me or distortions of diagnostic images or specimens that may result from electronic transmissions. I understand that the practice of medicine is not an exact science and that Practitioner makes no warranties or guarantees regarding treatment outcomes. I acknowledge that I will receive a copy of this consent concurrently upon execution via email to the email address I last provided but may also request a printed copy by calling the office of [facility].    I understand that my insurance will be billed for this visit.   I have read or had this consent read to me. . I understand the contents of this consent, which adequately explains the benefits and risks of the Services being provided via telemedicine.  . I have been provided ample opportunity to ask questions regarding this consent and the Services and have had my questions answered to my satisfaction. . I give my informed consent for the services to be provided through the use of telemedicine in my medical care  By participating in this telemedicine visit I agree to the above.Pt gives consent for virtual visit

## 2019-02-21 ENCOUNTER — Other Ambulatory Visit: Payer: Self-pay | Admitting: Cardiothoracic Surgery

## 2019-02-21 ENCOUNTER — Telehealth: Payer: PPO | Admitting: Cardiology

## 2019-02-21 DIAGNOSIS — Z951 Presence of aortocoronary bypass graft: Secondary | ICD-10-CM

## 2019-02-25 ENCOUNTER — Ambulatory Visit: Payer: Self-pay

## 2019-02-25 DIAGNOSIS — S0181XA Laceration without foreign body of other part of head, initial encounter: Secondary | ICD-10-CM | POA: Diagnosis not present

## 2019-02-25 DIAGNOSIS — W19XXXA Unspecified fall, initial encounter: Secondary | ICD-10-CM | POA: Diagnosis not present

## 2019-02-25 DIAGNOSIS — S0191XA Laceration without foreign body of unspecified part of head, initial encounter: Secondary | ICD-10-CM | POA: Diagnosis not present

## 2019-02-25 DIAGNOSIS — R0902 Hypoxemia: Secondary | ICD-10-CM | POA: Diagnosis not present

## 2019-02-25 DIAGNOSIS — S14109A Unspecified injury at unspecified level of cervical spinal cord, initial encounter: Secondary | ICD-10-CM | POA: Diagnosis not present

## 2019-02-25 DIAGNOSIS — R58 Hemorrhage, not elsewhere classified: Secondary | ICD-10-CM | POA: Diagnosis not present

## 2019-02-26 ENCOUNTER — Ambulatory Visit: Payer: Self-pay

## 2019-03-02 ENCOUNTER — Other Ambulatory Visit: Payer: Self-pay | Admitting: Cardiology

## 2019-03-04 ENCOUNTER — Other Ambulatory Visit: Payer: Self-pay

## 2019-03-04 ENCOUNTER — Ambulatory Visit (INDEPENDENT_AMBULATORY_CARE_PROVIDER_SITE_OTHER): Payer: Self-pay | Admitting: Physician Assistant

## 2019-03-04 ENCOUNTER — Ambulatory Visit
Admission: RE | Admit: 2019-03-04 | Discharge: 2019-03-04 | Disposition: A | Discharge status: Home / Self Care | Payer: PPO | Source: Ambulatory Visit | Attending: Cardiothoracic Surgery | Admitting: Cardiothoracic Surgery

## 2019-03-04 VITALS — BP 126/72 | HR 61 | Temp 97.9°F | Resp 20 | Ht 63.0 in | Wt 152.0 lb

## 2019-03-04 DIAGNOSIS — Z951 Presence of aortocoronary bypass graft: Secondary | ICD-10-CM

## 2019-03-04 DIAGNOSIS — I2510 Atherosclerotic heart disease of native coronary artery without angina pectoris: Secondary | ICD-10-CM

## 2019-03-04 NOTE — Progress Notes (Signed)
HPI:  Patient returns for routine postoperative follow-up having undergone CABG x3 by Dr. 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  The patient's early postoperative recovery while in the hospital was notable for Atrial fibrillation that was treated with amiodarone resulting in conversion back to sinus rhythm.  She was started back on Eliquis while in the hospital for her history of factor V Leyden mutation and history of pulmonary embolus. Since hospital discharge she has been seen in follow-up by Dr. Bettina Dominguez several weeks ago and found to be in acute heart failure.  She has been diuresed in the interim.  Presently, she has been reduced to a dose of Lasix 5 mg every other day and is maintaining a steady weight.  She has no complaints today.  She said her pain is well controlled and she is pleased with her progress.  She denies shortness of breath or dizziness.  She did experience a fall in her garage when she tripped over a cage on the floor.  This resulted and laceration to her forehead and several bruises.  She was seen in the emergency room at [facility] where the laceration was repaired.  She has returned to her regular activities including gardening. Her medications were reviewed.  She is had no changes other than the amiodarone has been discontinued and she now takes the Lasix 5 mg every other day as mentioned above.  Current Outpatient Medications  Medication Sig Dispense Refill  . acetaminophen (TYLENOL) 500 MG tablet Take 500-1,000 mg by mouth every 6 (six) hours as needed for moderate pain.     Marland Kitchen amiodarone (PACERONE) 200 MG tablet Take 200 mg by mouth daily.    Marland Kitchen amLODipine (NORVASC) 5 MG tablet Take 1 tablet (5 mg) daily every other day. 90 tablet 3  . aspirin EC 81 MG EC tablet Take 1 tablet (81 mg total) by mouth daily.    Marland Kitchen atorvastatin (LIPITOR) 40 MG tablet TAKE 1 TABLET BY MOUTH EVERY DAY AT 6PM 90 tablet 0  . citalopram  (CELEXA) 10 MG tablet Take 10 mg by mouth at bedtime. Take with 40 mg to equal 50 mg at bedtime    . citalopram (CELEXA) 40 MG tablet Take 40 mg by mouth daily. Take with 10 mg to equal 50 mg at bedtime    . furosemide (LASIX) 20 MG tablet Take 20 mg by mouth every other day.    . levothyroxine (SYNTHROID, LEVOTHROID) 125 MCG tablet Take 125 mcg by mouth at bedtime.   0  . linaclotide (LINZESS) 145 MCG CAPS capsule Take 145 mcg by mouth every other day.     . lisinopril (ZESTRIL) 2.5 MG tablet Take 1 tablet (2.5 mg total) by mouth daily. 90 tablet 1  . metoprolol succinate (TOPROL-XL) 25 MG 24 hr tablet TAKE 1 TABLET(25 MG) BY MOUTH DAILY 90 tablet 0  . mirtazapine (REMERON) 30 MG tablet Take 30 mg by mouth at bedtime.     . Multiple Vitamin (MULTIVITAMIN) tablet Take 1 tablet by mouth at bedtime.     . Oxycodone HCl 10 MG TABS Take 10 mg by mouth every 6 (six) hours as needed (pain).     . pantoprazole (PROTONIX) 40 MG tablet TAKE 1 TABLET(40 MG) BY MOUTH TWICE DAILY 180 tablet 0  . potassium chloride SA (K-DUR) 20 MEQ tablet Take 1 tablet (20 mEq total) by mouth daily. 90 tablet 2  . XARELTO 20 MG TABS tablet Take 20 mg by mouth  daily.  0   No current facility-administered medications for this visit.     Physical Exam VS:  BP 126/72, P61, RR20, T 97.9,  O2 sat 100  General: Tina Dominguez is alert and oriented with a generally bright affect today. Heart: Regular rate and rhythm.  No murmur. Chest: Breath sounds are clear to auscultation.  The chest x-ray is reviewed and shows expected postoperative changes but no complicating features.  There are no effusions.  Lung fields are clear. Wounds: The sternotomy incision, chest tube incisions, and the right lower extremity EVH incisions are all well approximated and healing with no sign of complication. Extremities: She has trace ankle edema.   Diagnostic Tests:  EXAM: CHEST - 2 VIEW  COMPARISON:  12/16/2018  FINDINGS: Normal heart size  post CABG.  Mediastinal contours and pulmonary vascularity normal.  Atherosclerotic calcification aorta.  Lungs clear.  No infiltrate, pleural effusion or pneumothorax.  Bones demineralized with evidence of prior lumbar fusion.  IMPRESSION: No acute abnormalities.   Electronically Signed   By: Tina Dominguez M.D.   On: 03/04/2019 13:16   Impression / Plan: 78 year old female status post CABG x3 by Dr. Pia Dominguez on 12/11/2018 for multivessel coronary disease and unstable angina pectoris.  She had some atrial fibrillation early postoperatively that appears to have resolved.  Her amiodarone was discontinued by Dr. Bettina Dominguez.  She is also been treated by Dr. Bettina Dominguez for some heart failure symptoms manifested by shortness of breath and peripheral edema.  These symptoms have also resolved.  I encouraged her to continue with the Lasix 5 mg every other day as prescribed by Dr. Bettina Dominguez until her next visit at his office.  Other medications were reviewed and no changes appear necessary from our standpoint.  She is not requiring any pain medicine at this stage.  I asked her to follow-up with Dr. Servando Dominguez in 1 month.     Antony Odea, PA-C [facility] [phone]

## 2019-03-04 NOTE — Patient Instructions (Signed)
-  Avoid strenuous lifting for another 6 weeks -No changes in medications -Return to see Dr. Servando Snare in 1 month

## 2019-03-05 ENCOUNTER — Other Ambulatory Visit: Payer: Self-pay | Admitting: Cardiology

## 2019-03-05 MED ORDER — FUROSEMIDE 20 MG PO TABS: 20.0000 mg | ORAL_TABLET | ORAL | 1 refills | Status: DC

## 2019-03-05 NOTE — Telephone Encounter (Signed)
°*  STAT* If patient is at the pharmacy, call can be transferred to refill team.   1. Which medications need to be refilled? (please list name of each medication and dose if known) Furosemide 20mg  tablets  2. Which pharmacy/location (including street and city if local pharmacy) is medication to be sent to? [facility] on [address] [place]  3. Do they need a 30 day or 90 day supply? [place]

## 2019-03-05 NOTE — Telephone Encounter (Signed)
Lasix refill sent to [facility] on [address] [place]

## 2019-03-07 DIAGNOSIS — Z1211 Encounter for screening for malignant neoplasm of colon: Secondary | ICD-10-CM | POA: Diagnosis not present

## 2019-03-18 ENCOUNTER — Other Ambulatory Visit: Payer: Self-pay | Admitting: Cardiology

## 2019-03-18 DIAGNOSIS — I10 Essential (primary) hypertension: Secondary | ICD-10-CM | POA: Diagnosis not present

## 2019-03-18 DIAGNOSIS — Z0000 Encounter for general adult medical examination without abnormal findings: Secondary | ICD-10-CM | POA: Diagnosis not present

## 2019-03-18 DIAGNOSIS — E038 Other specified hypothyroidism: Secondary | ICD-10-CM | POA: Diagnosis not present

## 2019-03-18 DIAGNOSIS — Z5181 Encounter for therapeutic drug level monitoring: Secondary | ICD-10-CM | POA: Diagnosis not present

## 2019-03-18 DIAGNOSIS — E782 Mixed hyperlipidemia: Secondary | ICD-10-CM | POA: Diagnosis not present

## 2019-03-18 DIAGNOSIS — N183 Chronic kidney disease, stage 3 (moderate): Secondary | ICD-10-CM | POA: Diagnosis not present

## 2019-04-05 ENCOUNTER — Other Ambulatory Visit: Payer: Self-pay | Admitting: *Deleted

## 2019-04-05 ENCOUNTER — Other Ambulatory Visit: Payer: Self-pay

## 2019-04-05 DIAGNOSIS — E876 Hypokalemia: Secondary | ICD-10-CM | POA: Diagnosis not present

## 2019-04-05 LAB — BASIC METABOLIC PANEL
BUN/Creatinine Ratio: 21 (ref 12–28)
BUN: 22 mg/dL (ref 8–27)
CO2: 24 mmol/L (ref 20–29)
Calcium: 9.4 mg/dL (ref 8.7–10.3)
Chloride: 106 mmol/L (ref 96–106)
Creatinine, Ser: 1.07 mg/dL — ABNORMAL HIGH (ref 0.57–1.00)
GFR calc Af Amer: 59 mL/min/{1.73_m2} — ABNORMAL LOW (ref >59)
GFR calc non Af Amer: 51 mL/min/{1.73_m2} — ABNORMAL LOW (ref >59)
Glucose: 95 mg/dL (ref 65–99)
Potassium: 5.9 mmol/L — ABNORMAL HIGH (ref 3.5–5.2)
Sodium: 141 mmol/L (ref 134–144)

## 2019-04-08 ENCOUNTER — Telehealth: Payer: Self-pay

## 2019-04-08 ENCOUNTER — Telehealth: Payer: Self-pay | Admitting: Cardiology

## 2019-04-08 DIAGNOSIS — E876 Hypokalemia: Secondary | ICD-10-CM

## 2019-04-08 DIAGNOSIS — I110 Hypertensive heart disease with heart failure: Secondary | ICD-10-CM

## 2019-04-08 NOTE — Telephone Encounter (Signed)
Patient notified to stop lisinopril and recheck BMP in 1 week.  Patient agreed to plan and verbalized understanding.

## 2019-04-08 NOTE — Telephone Encounter (Signed)
Patient called back concerned about why she is taking potassium and she is supposed to still take??

## 2019-04-08 NOTE — Telephone Encounter (Signed)
-----   Message from Richardo Priest, MD sent at 04/05/2019  5:00 PM EDT ----- Stop lisinopril recheck 1 week

## 2019-04-09 ENCOUNTER — Telehealth: Payer: Self-pay | Admitting: *Deleted

## 2019-04-09 MED ORDER — POTASSIUM CHLORIDE CRYS ER 20 MEQ PO TBCR: EXTENDED_RELEASE_TABLET | ORAL | 2 refills | Status: DC

## 2019-04-09 NOTE — Telephone Encounter (Signed)
-----   Message from Richardo Priest, MD sent at 04/05/2019  5:00 PM EDT ----- Stop lisinopril recheck 1 week

## 2019-04-09 NOTE — Telephone Encounter (Signed)
Telephone call to patient . Informed of lab results and to stop lisinopril. Also after speaking with Laurann Montana NP-C she is to only take potassium on days she takes furosemide. BMP in 1 week.Patient verbalized understanding

## 2019-04-11 ENCOUNTER — Encounter: Payer: PPO | Admitting: Cardiothoracic Surgery

## 2019-04-15 DIAGNOSIS — E782 Mixed hyperlipidemia: Secondary | ICD-10-CM | POA: Diagnosis not present

## 2019-04-15 DIAGNOSIS — N183 Chronic kidney disease, stage 3 (moderate): Secondary | ICD-10-CM | POA: Diagnosis not present

## 2019-04-15 DIAGNOSIS — Z5181 Encounter for therapeutic drug level monitoring: Secondary | ICD-10-CM | POA: Diagnosis not present

## 2019-04-15 DIAGNOSIS — Z7901 Long term (current) use of anticoagulants: Secondary | ICD-10-CM | POA: Diagnosis not present

## 2019-04-15 DIAGNOSIS — E038 Other specified hypothyroidism: Secondary | ICD-10-CM | POA: Diagnosis not present

## 2019-04-15 DIAGNOSIS — I10 Essential (primary) hypertension: Secondary | ICD-10-CM | POA: Diagnosis not present

## 2019-04-19 DIAGNOSIS — E876 Hypokalemia: Secondary | ICD-10-CM | POA: Diagnosis not present

## 2019-04-19 DIAGNOSIS — I110 Hypertensive heart disease with heart failure: Secondary | ICD-10-CM | POA: Diagnosis not present

## 2019-04-19 LAB — BASIC METABOLIC PANEL
BUN/Creatinine Ratio: 22 (ref 12–28)
BUN: 26 mg/dL (ref 8–27)
CO2: 21 mmol/L (ref 20–29)
Calcium: 9.4 mg/dL (ref 8.7–10.3)
Chloride: 106 mmol/L (ref 96–106)
Creatinine, Ser: 1.17 mg/dL — ABNORMAL HIGH (ref 0.57–1.00)
GFR calc Af Amer: 53 mL/min/{1.73_m2} — ABNORMAL LOW (ref >59)
GFR calc non Af Amer: 46 mL/min/{1.73_m2} — ABNORMAL LOW (ref >59)
Glucose: 121 mg/dL — ABNORMAL HIGH (ref 65–99)
Potassium: 4.7 mmol/L (ref 3.5–5.2)
Sodium: 143 mmol/L (ref 134–144)

## 2019-05-02 NOTE — Progress Notes (Signed)
Cardiology Office Note:    Date:  05/03/2019   ID:  Tina Dominguez, DOB Jun 05, 1941, MRN [id number]  PCP:  Irven Shelling, MD  Cardiologist:  Shirlee More, MD    Referring MD: Irven Shelling, MD    ASSESSMENT:    1. Hypertensive heart disease with heart failure ([place])   2. Chronic diastolic heart failure ([place])   3. Coronary artery disease of native artery of native heart with stable angina pectoris ([place])   4. PAF (paroxysmal atrial fibrillation) ([place])   5. On amiodarone therapy   6. Chronic anticoagulation    PLAN:    In order of problems listed above:  1. Hypertensive heart disease with heart failure - BP elevated today - she was asked to check routinely at home. Presently only checks for "spells" when she feels poorly with SBP readings as high as 200. Will Rx PRN Clonidine 0.88m for SBP >180 given her good healthcare literacy. Heart failure NYHA class I-II, euvolemic on exam, as below.   2. Chronic diastolic heart failure - Euvolemic on exam. Denies SOB, edema. Reports dramatic improvement after initiation of Lasix three times per week. Continue present diuretic. Asked her to start checking her weight daily and keep a log.   3. CAD s/p CABG - Stable, no anginal symptoms. Some soreness at her midsternal incision, but no chest pain. No indication for ischemic evaluation. GDMT aspirin, beta blocker, statin.   4. PAF - Having "spells" of not feeling well, HR in 90s, high blood pressure - anticipate these are episodes of atrial fibrillation. She is anticoagulated on Xarelto secondary to PAF, Factor V Leiden. She denies bleeding complications. Amiodarone dose increased, as below. EKG today with sinus bradycardia rate 58 bpm.   5. Amiodarone therapy - Reports "spells" of high blood pressure with HR in the 90s. Will check amiodarone level, TSH, liver function today - anticipate she is subtherapeutic. Amiodarone dose increased today. Careful up titration given previous dizziness.   Next  appointment: 3 months   Medication Adjustments/Labs and Tests Ordered: Current medicines are reviewed at length with the patient today.  Concerns regarding medicines are outlined above.  Orders Placed This Encounter  Procedures  . Comp Met (CMET)  . TSH  . Amiodarone level  . EKG 12-Lead   Meds ordered this encounter  Medications  . amiodarone (PACERONE) 200 MG tablet    Sig: Take 2 tabs (400 mg) daily on Mon,Wed And FRi. Take 1 tab (200 mg) on Tues Thurs, Sat ,Sun    Dispense:  90 tablet    Refill:  1  . cloNIDine (CATAPRES) 0.1 MG tablet    Sig: Take 1 tablet (0.1 mg total) by mouth daily as needed. If SBP (top number) is greater than 180    Dispense:  30 tablet    Refill:  2    Chief Complaint  Patient presents with  . Follow-up  . Congestive Heart Failure    History of Present Illness:    Tina RYNDERSis a 78y.o. 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 She was last seen 01/29/19 and treated for HF exacerbation.   She reports feeling well. High healthcare literacy and takes very good care of herself. Tells me her edema has improved since starting Lasix 270mevery other day. Denies shortness of breath, dyspnea on exertion. Tells me her midsternal incision is still  a bit sore, but no chest pain.   Tells me she has had 3-4 "spells" over the last 3 weeks. She will "not feel quite right" and her BP will be up to 237 systolic with HR in the 62G. Anticipate these are bouts of PAF which was noted during her hosptialization for CABG. She does not check her BP routinely at home, only when she feels unwell. She does not weigh herself daily, but is agreeable to start.   Compliance with diet, lifestyle and medications: Yes Past Medical History:  Diagnosis Date  . Aftercare following surgery of the circulatory system, [place] 10/14/2013  . Anemia 12/13/2017  . Anxiety 12/13/2017  .  Arthritis   . Carotid artery occlusion   . Clotting disorder ([place])   . Coronary artery disease   . Depression   . Dysphagia 04/17/2017  . GERD (gastroesophageal reflux disease) 05/26/2011  . GI bleeding 10/04/2017  . Gout 12/13/2017  . Hiatal hernia 12/13/2017  . 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   Marland Kitchen History of recurrent TIAs 12/13/2017  . History of right-sided carotid endarterectomy 10/10/2011  . Hyperlipidemia   . Hypertension   . Leg pain    with walking  . Occlusion and stenosis of carotid artery without mention of cerebral infarction 10/08/2012  . Osteoarthritis 05/26/2011  . Osteoporosis   . PE (pulmonary embolism) 05/26/2011  . 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[place]) 04/17/2017  . Reflux   . Renal failure 12/13/2017  . S/P IVC filter 12/13/2017  . Schatzki's ring 12/13/2017  . Stroke [facility])    X's 4  . Valvular heart disease 12/13/2017    Past Surgical History:  Procedure Laterality Date  . ABDOMINAL HYSTERECTOMY  1980   TAH   . APPENDECTOMY    . BACK SURGERY  06/2011  . BREAST SURGERY     x2  . CAROTID ENDARTERECTOMY  11/11/2010   Right CEA  . CORONARY ARTERY BYPASS GRAFT N/A 12/11/2018   Procedure: CORONARY ARTERY BYPASS GRAFTING (CABG) x three , using left internal mammary artery, and right leg greater saphenous vein harvested endoscopically - SVG to OM1, SVG to Distal RCA;  Surgeon: Grace Isaac, MD;  Location: [place];  Service: Open Heart Surgery;  Laterality: N/A;  . IVC FILTER INSERTION    . JOINT REPLACEMENT Right 2005   Right Total Knee  . JOINT REPLACEMENT Left 2005   Left Total Knee  . LEFT HEART CATH AND CORONARY ANGIOGRAPHY N/A 12/10/2018   Procedure: LEFT HEART CATH AND CORONARY ANGIOGRAPHY;  Surgeon: Jettie Booze, MD;  Location: [place] [facility];  Service: Cardiovascular;  Laterality: N/A;  . SHOULDER SURGERY     right  . SPINE SURGERY  07-11-11    Decomp. Laminectomy, fusion of L4-5,L5-S1 by Dr. Saintclair Halsted  . TEE WITHOUT CARDIOVERSION N/A 12/11/2018   Procedure: TRANSESOPHAGEAL ECHOCARDIOGRAM (TEE);  Surgeon: Grace Isaac, MD;  Location: [place];  Service: Open Heart Surgery;  Laterality: N/A;  . TOTAL KNEE ARTHROPLASTY     right  and left    Current Medications: Current Meds  Medication Sig  . acetaminophen (TYLENOL) 500 MG tablet Take 500-1,000 mg by mouth every 6 (six) hours as needed for moderate pain.   Marland Kitchen amiodarone (PACERONE) 200 MG tablet Take 2 tabs (400 mg) daily on Mon,Wed And FRi. Take 1 tab (200 mg) on Tues Thurs, Sat ,Sun  .  amLODipine (NORVASC) 5 MG tablet Take 1 tablet (5 mg) daily every other day.  Marland Kitchen aspirin EC 81 MG EC tablet Take 1 tablet (81 mg total) by mouth daily.  Marland Kitchen atorvastatin (LIPITOR) 40 MG tablet TAKE 1 TABLET BY MOUTH EVERY DAY AT 6PM  . citalopram (CELEXA) 10 MG tablet Take 10 mg by mouth at bedtime. Take with 40 mg to equal 50 mg at bedtime  . citalopram (CELEXA) 40 MG tablet Take 40 mg by mouth daily. Take with 10 mg to equal 50 mg at bedtime  . furosemide (LASIX) 20 MG tablet Take 1 tablet (20 mg total) by mouth every other day.  . levothyroxine (SYNTHROID, LEVOTHROID) 125 MCG tablet Take 125 mcg by mouth at bedtime.   Marland Kitchen linaclotide (LINZESS) 145 MCG CAPS capsule Take 145 mcg by mouth every other day.   . metoprolol succinate (TOPROL-XL) 25 MG 24 hr tablet TAKE 1 TABLET(25 MG) BY MOUTH DAILY  . mirtazapine (REMERON) 30 MG tablet Take 30 mg by mouth at bedtime.   . Multiple Vitamin (MULTIVITAMIN) tablet Take 1 tablet by mouth at bedtime.   . Oxycodone HCl 10 MG TABS Take 10 mg by mouth every 6 (six) hours as needed (pain).   . pantoprazole (PROTONIX) 40 MG tablet TAKE 1 TABLET(40 MG) BY MOUTH TWICE DAILY  . potassium chloride SA (K-DUR) 20 MEQ tablet Take 1 Tab (75mq) every other day with lasix  . XARELTO 20 MG TABS tablet Take 20 mg by mouth daily.  . [DISCONTINUED] amiodarone (PACERONE) 200 MG tablet  Take 200 mg by mouth daily.     Allergies:   Patient has no known allergies.   Social History   Socioeconomic History  . Marital status: Widowed    Spouse name: Not on file  . Number of children: Not on file  . Years of education: Not on file  . Highest education level: Not on file  Occupational History  . Not on file  Social Needs  . Financial resource strain: Not on file  . Food insecurity    Worry: Not on file    Inability: Not on file  . Transportation needs    Medical: Not on file    Non-medical: Not on file  Tobacco Use  . Smoking status: Never Smoker  . Smokeless tobacco: Never Used  Substance and Sexual Activity  . Alcohol use: No  . Drug use: No  . Sexual activity: Not on file  Lifestyle  . Physical activity    Days per week: Not on file    Minutes per session: Not on file  . Stress: Not on file  Relationships  . Social [place] phone: Not on file    Gets together: Not on file    Attends religious service: Not on file    Active member of club or organization: Not on file    Attends meetings of clubs or organizations: Not on file    Relationship status: Not on file  Other Topics Concern  . Not on file  Social History Narrative  . Not on file     Family History: The patient's family history includes Clotting disorder in her mother; Deep vein thrombosis in her mother and sister; Diabetes in her mother and sister; Heart disease in her brother and mother; Heart disease (age of onset: 632 in her father; Hyperlipidemia in her mother; Hypertension in her mother; Stroke in her maternal grandmother; Varicose Veins in her father. ROS:  Please see the history of present illness.    Review of Systems  Constitution: Negative for chills and fever.  Cardiovascular: Negative for chest pain, dyspnea on exertion, leg swelling and palpitations.       "spells of high blood pressure"  Respiratory: Negative for cough, shortness of breath and wheezing.    Gastrointestinal: Negative for melena, nausea and vomiting.  Genitourinary: Negative for hematuria.  Neurological: Negative for dizziness, light-headedness and weakness.   All other systems reviewed and are negative.  EKGs/Labs/Other Studies Reviewed:    The following studies were reviewed today:  EKG:  EKG ordered today and personally reviewed.  The ekg ordered today demonstrates sinus bradycardia rate 58 without acute ST/T wave changes.   Recent Labs: 12/14/2018: Magnesium 1.9 01/29/2019: ALT 6; Hemoglobin 11.3; NT-Pro BNP 1,163; Platelets 419; TSH 1.110 04/19/2019: BUN 26; Creatinine, Ser 1.17; Potassium 4.7; Sodium 143  Recent Lipid Panel    Component Value Date/Time   CHOL 104 12/18/2018 0246   TRIG 123 12/18/2018 0246   HDL 35 (L) 12/18/2018 0246   CHOLHDL 3.0 12/18/2018 0246   VLDL 25 12/18/2018 0246   LDLCALC 44 12/18/2018 0246    Physical Exam:    VS:  BP 140/82 (BP Location: Right Arm, Patient Position: Sitting, Cuff Size: Normal)   Pulse (!) 56   Temp 98 F (36.7 C)   Ht '5\' 3"'  (1.6 m)   Wt 157 lb (71.2 kg)   SpO2 96%   BMI 27.81 kg/m     Wt Readings from Last 3 Encounters:  05/03/19 157 lb (71.2 kg)  03/04/19 152 lb (68.9 kg)  01/29/19 159 lb 6.4 oz (72.3 kg)     GEN:  Well nourished, well developed in no acute distress HEENT: Normal NECK: No JVD; No carotid bruits LYMPHATICS: No lymphadenopathy CARDIAC: RRR, no murmurs, rubs, gallops RESPIRATORY:  Clear to auscultation without rales, wheezing or rhonchi  ABDOMEN: Soft, non-tender, non-distended MUSCULOSKELETAL:  No edema; No deformity  SKIN: Warm and dry NEUROLOGIC:  Alert and oriented x 3 PSYCHIATRIC:  Normal affect    Signed, Shirlee More, MD  05/03/2019 10:53 AM    [place]

## 2019-05-03 ENCOUNTER — Ambulatory Visit (INDEPENDENT_AMBULATORY_CARE_PROVIDER_SITE_OTHER): Payer: PPO | Admitting: Cardiology

## 2019-05-03 ENCOUNTER — Encounter: Payer: Self-pay | Admitting: Cardiology

## 2019-05-03 ENCOUNTER — Other Ambulatory Visit: Payer: Self-pay

## 2019-05-03 VITALS — BP 140/82 | HR 56 | Temp 98.0°F | Ht 63.0 in | Wt 157.0 lb

## 2019-05-03 DIAGNOSIS — I25118 Atherosclerotic heart disease of native coronary artery with other forms of angina pectoris: Secondary | ICD-10-CM | POA: Diagnosis not present

## 2019-05-03 DIAGNOSIS — Z79899 Other long term (current) drug therapy: Secondary | ICD-10-CM

## 2019-05-03 DIAGNOSIS — I110 Hypertensive heart disease with heart failure: Secondary | ICD-10-CM

## 2019-05-03 DIAGNOSIS — I480 Paroxysmal atrial fibrillation: Secondary | ICD-10-CM | POA: Diagnosis not present

## 2019-05-03 DIAGNOSIS — I5032 Chronic diastolic (congestive) heart failure: Secondary | ICD-10-CM

## 2019-05-03 DIAGNOSIS — Z7901 Long term (current) use of anticoagulants: Secondary | ICD-10-CM | POA: Diagnosis not present

## 2019-05-03 MED ORDER — CLONIDINE HCL 0.1 MG PO TABS: 0.1000 mg | ORAL_TABLET | Freq: Every day | ORAL | 2 refills | Status: DC | PRN

## 2019-05-03 MED ORDER — AMIODARONE HCL 200 MG PO TABS: ORAL_TABLET | ORAL | 1 refills | Status: DC

## 2019-05-03 NOTE — Patient Instructions (Signed)
Medication Instructions:  Your physician has recommended you make the following change in your medication:   INCREASE: Amiodarone 200 mg: Take 2 tabs( 400 Mg) on Mon Wed And Fri then 1 tab (200 mg) on Tues Thurs Sat and Sun.  START: Clonidine 0.1 mg: Take 1 tab daily as needed for SBP (top number) is greater than 180.  If you need a refill on your cardiac medications before your next appointment, please call your pharmacy.   Lab work: Your physician recommends that you return for lab work in: [place]  If you have labs (blood work) drawn today and your tests are completely normal, you will receive your results only by: Marland Kitchen MyChart Message (if you have MyChart) OR . A paper copy in the mail If you have any lab test that is abnormal or we need to change your treatment, we will call you to review the results.  Testing/Procedures: None  Follow-Up: At [facility], you and your health needs are our priority.  As part of our continuing mission to provide you with exceptional heart care, we have created designated Provider Care Teams.  These Care Teams include your primary Cardiologist (physician) and Advanced Practice Providers (APPs -  Physician Assistants and Nurse Practitioners) who all work together to provide you with the care you need, when you need it. You will need a follow up appointment in 3 months.  Please call our office 2 months in advance to schedule this appointment.  You may see Shirlee More, MD or another member of our [place] Provider Team in [place]: Jenne Campus, MD . Jyl Heinz, MD  Any Other Special Instructions Will Be Listed Below (If Applicable).

## 2019-05-05 IMAGING — DX PORTABLE CHEST - 1 VIEW
1 series · 1 of 1 positions shown · non-contrast
Comparison: Chest x-ray dated December 05, 2018.

CLINICAL DATA: CABG.

EXAM:
PORTABLE CHEST 1 VIEW

[chest ap]
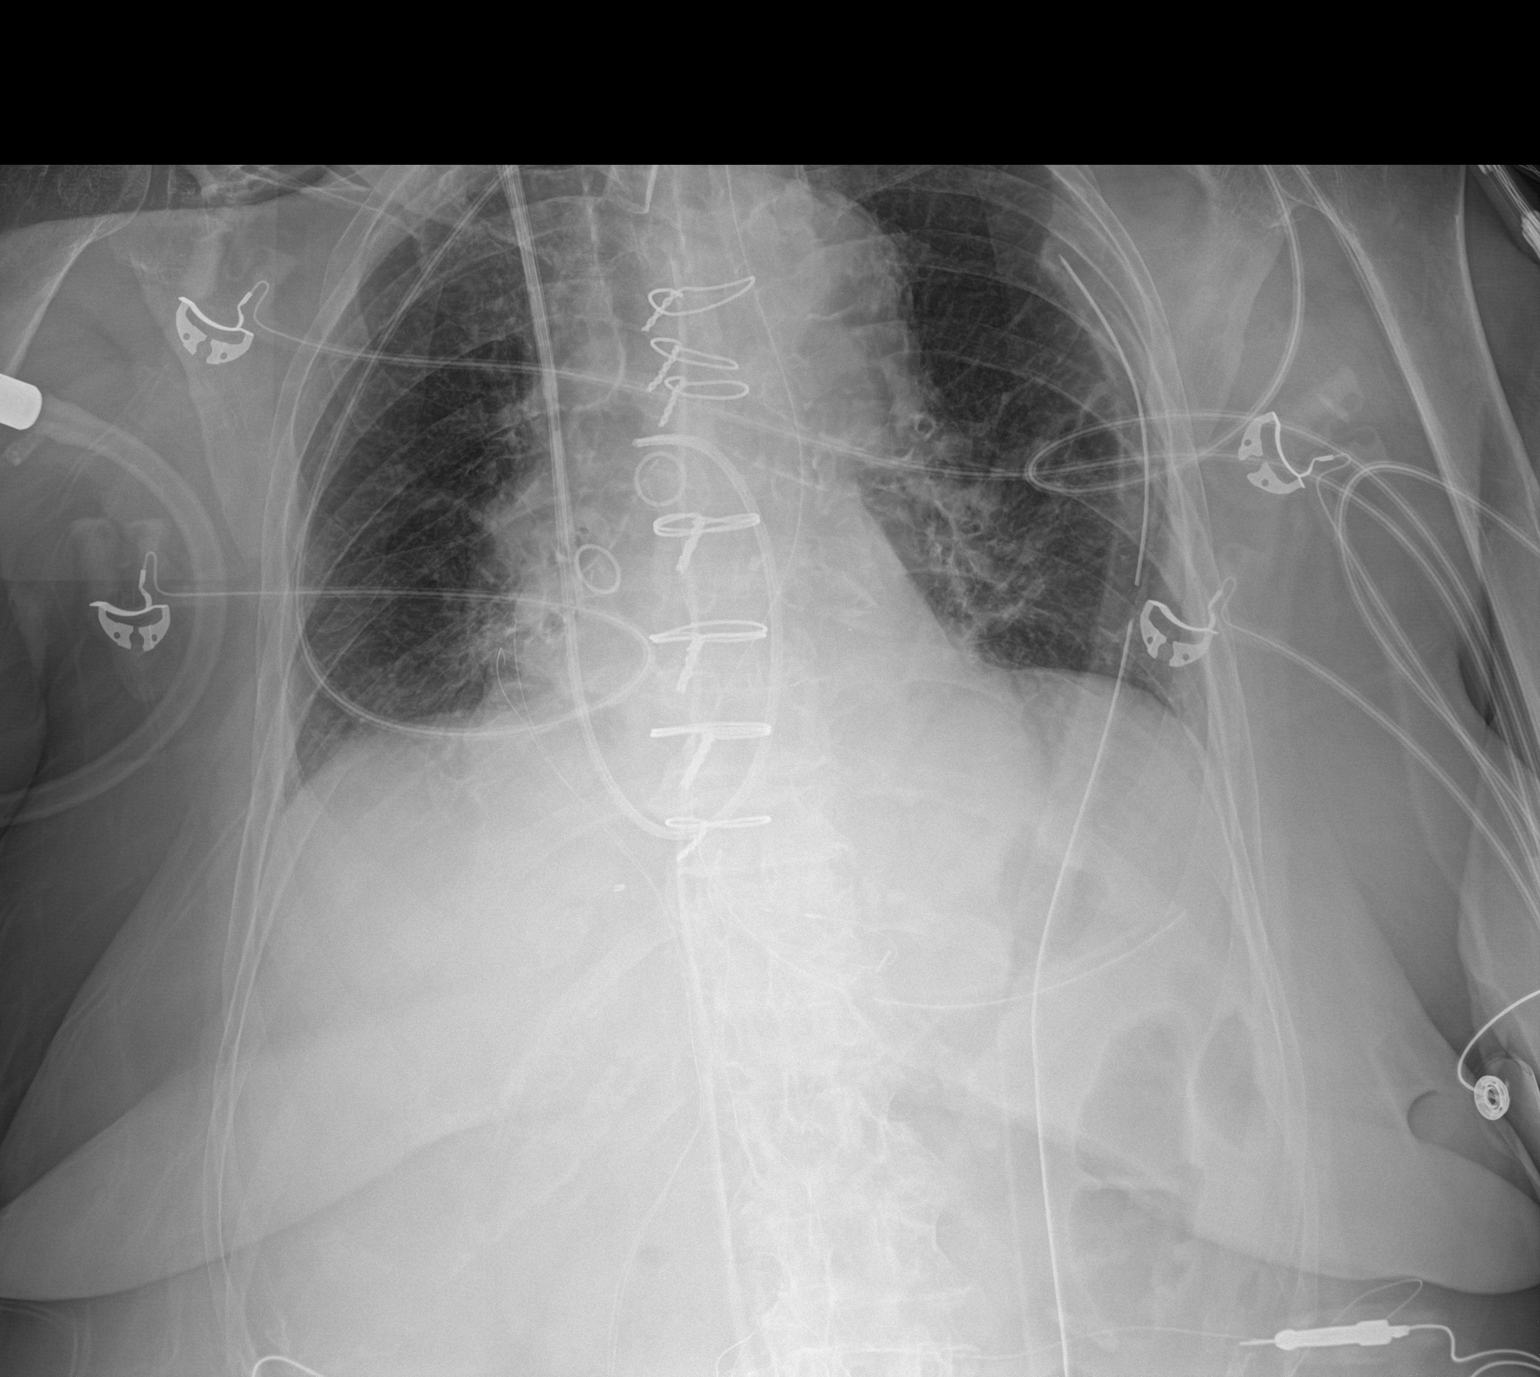

[1 of 1 positions shown; findings below may reference images not displayed]

FINDINGS: Endotracheal tube in position with the tip 5.1 cm above the level of
the carina. Enteric tube in the stomach. Right internal jugular
Swan-Ganz catheter with the tip in the main pulmonary outflow tract.
Mediastinal and left chest tubes are in good position.

Interval CABG. Normal heart size. Low lung volumes with
bronchovascular crowding. Minimal bibasilar atelectasis. No pleural
effusion or pneumothorax. No acute osseous abnormality.
IMPRESSION: 1. Interval CABG with appropriately positioned support apparatus.
2. Low lung volumes with minimal bibasilar atelectasis.

## 2019-05-06 ENCOUNTER — Other Ambulatory Visit: Payer: Self-pay | Admitting: Cardiology

## 2019-05-06 IMAGING — DX PORTABLE CHEST - 1 VIEW
1 series · 1 of 1 positions shown · non-contrast
Comparison: 12/11/2018 and earlier.

CLINICAL DATA: 77-year-old female postoperative day 1 status post
CABG.

EXAM:
PORTABLE CHEST 1 VIEW

[chest]
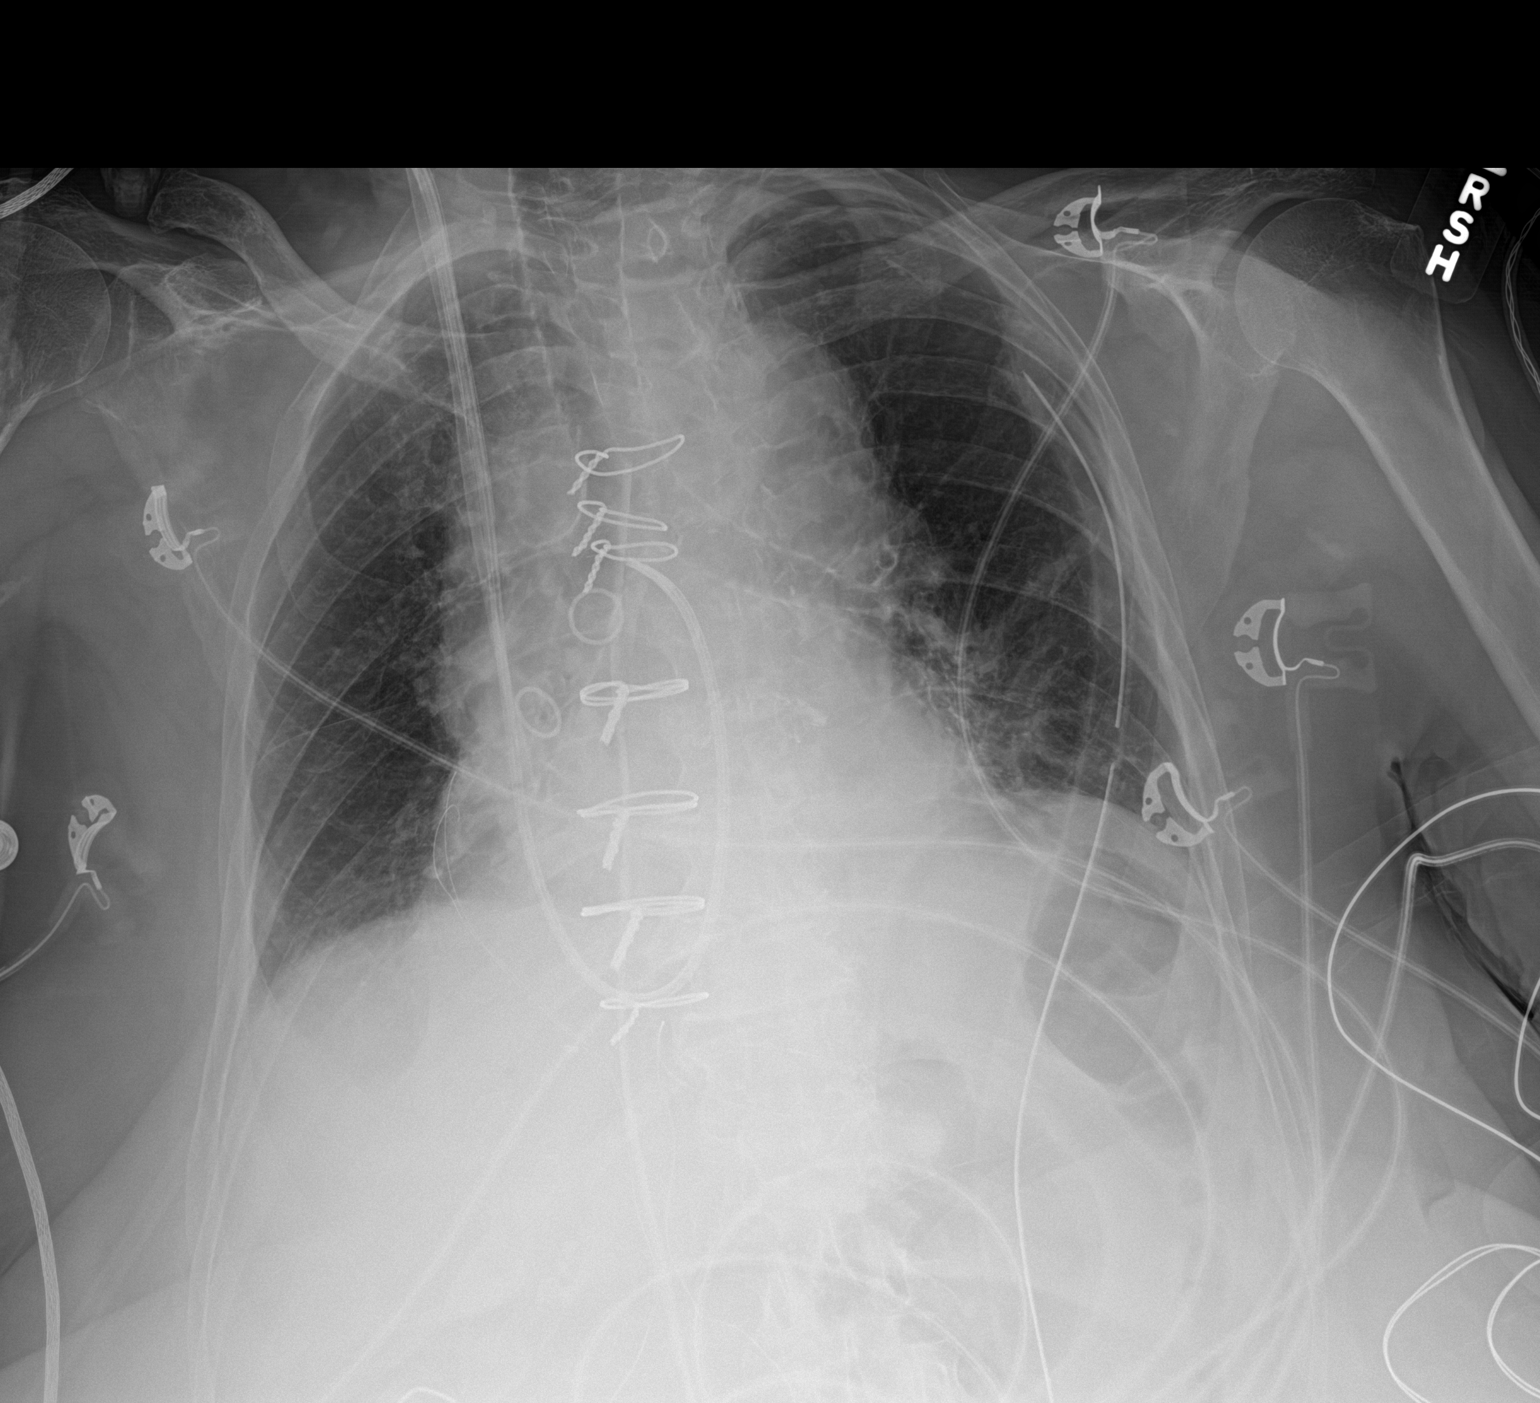

[1 of 1 positions shown; findings below may reference images not displayed]

FINDINGS: Portable AP semi upright view at 4054 hours. Extubated. Enteric tube
removed. Left chest tube remains in place. Possible trace left
pneumothorax in the apex.

Right IJ approach Swan-Ganz catheter position is not significantly
changed. Mediastinal tube remains in place.

Stable lung volumes and ventilation. No pulmonary edema. No pleural
effusion or confluent opacity. Stable cardiac size and mediastinal
contours.

Negative visible bowel gas pattern.
IMPRESSION: 1. Extubated and enteric tube removed. Otherwise stable lines and
tubes.
2. Possible trace left apical pneumothorax. No other acute
cardiopulmonary abnormality.

## 2019-05-06 MED ORDER — ATORVASTATIN CALCIUM 40 MG PO TABS: ORAL_TABLET | ORAL | 0 refills | Status: DC

## 2019-05-06 NOTE — Telephone Encounter (Signed)
Rx for atorvastatin 40mg  one tablet daily #90 as requested.

## 2019-05-06 NOTE — Telephone Encounter (Signed)
Attempted to reach patient by phone to answer questions regarding medication issues, but there was no answer and no voicemail.

## 2019-05-06 NOTE — Telephone Encounter (Signed)
°*  STAT* If patient is at the pharmacy, call can be transferred to refill team.   1. Which medications need to be refilled? (please list name of each medication and dose if known) Atorvastatin 40mg  tablet  2. Which pharmacy/location (including street and city if local pharmacy) is medication to be sent to?[facility] on [address] [place]  3. Do they need a 30 day or 90 day supply? 90   Patient would also like a call to discuss some other medication issues

## 2019-05-07 LAB — COMPREHENSIVE METABOLIC PANEL
ALT: 9 IU/L (ref 0–32)
AST: 20 IU/L (ref 0–40)
Albumin/Globulin Ratio: 1.6 (ref 1.2–2.2)
Albumin: 3.9 g/dL (ref 3.7–4.7)
Alkaline Phosphatase: 109 IU/L (ref 39–117)
BUN/Creatinine Ratio: 24 (ref 12–28)
BUN: 26 mg/dL (ref 8–27)
Bilirubin Total: 0.4 mg/dL (ref 0.0–1.2)
CO2: 23 mmol/L (ref 20–29)
Calcium: 9.3 mg/dL (ref 8.7–10.3)
Chloride: 104 mmol/L (ref 96–106)
Creatinine, Ser: 1.07 mg/dL — ABNORMAL HIGH (ref 0.57–1.00)
GFR calc Af Amer: 59 mL/min/{1.73_m2} — ABNORMAL LOW (ref >59)
GFR calc non Af Amer: 51 mL/min/{1.73_m2} — ABNORMAL LOW (ref >59)
Globulin, Total: 2.5 g/dL (ref 1.5–4.5)
Glucose: 103 mg/dL — ABNORMAL HIGH (ref 65–99)
Potassium: 4.4 mmol/L (ref 3.5–5.2)
Sodium: 143 mmol/L (ref 134–144)
Total Protein: 6.4 g/dL (ref 6.0–8.5)

## 2019-05-07 LAB — AMIODARONE LEVEL
Amiodarone, Serum: 0.4 ug/mL — ABNORMAL LOW (ref 1.0–2.5)
Noramiodarone,S: 0.3 ug/mL — ABNORMAL LOW (ref 1.0–2.5)

## 2019-05-07 LAB — TSH: TSH: 0.087 u[IU]/mL — ABNORMAL LOW (ref 0.450–4.500)

## 2019-05-08 IMAGING — DX PORTABLE CHEST - 1 VIEW
1 series · 1 of 1 positions shown · non-contrast
Comparison: 12/13/2018.

CLINICAL DATA: Sore chest.  Chest tube removal.

EXAM:
PORTABLE CHEST 1 VIEW

[chest ap]
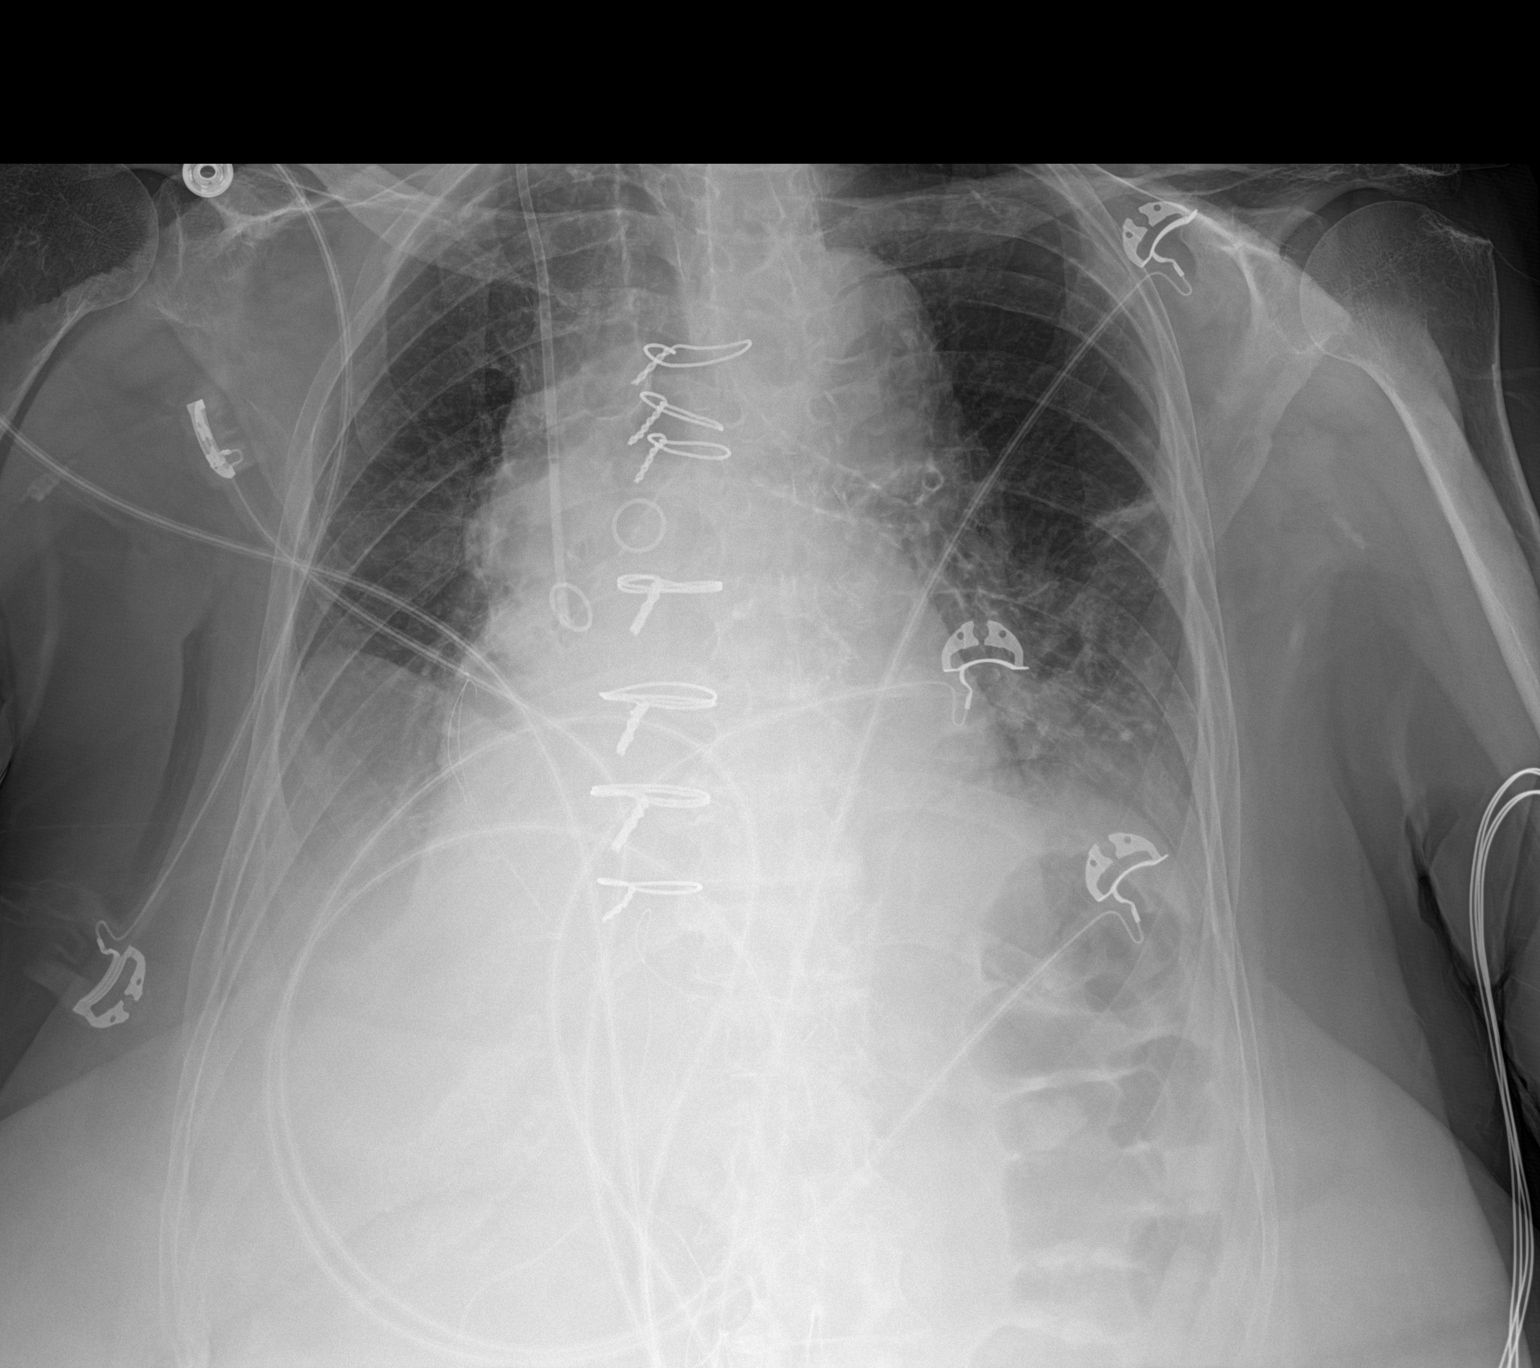

[1 of 1 positions shown; findings below may reference images not displayed]

FINDINGS: Right IJ line in stable position. Prior CABG. Cardiomegaly.
Persistent bibasilar atelectasis/infiltrates, most prominent on the
left. Persistent right pleural effusion. No pleural effusion or
pneumothorax.
IMPRESSION: 1.  Right IJ line stable position.

2. Prior CABG. Cardiomegaly with normal pulmonary vascularity. 3.
Persistent bibasilar atelectasis/infiltrates Follow-up trauma left.
Persistent right pleural effusion.

## 2019-05-09 ENCOUNTER — Encounter: Payer: Self-pay | Admitting: Cardiothoracic Surgery

## 2019-05-09 ENCOUNTER — Other Ambulatory Visit: Payer: Self-pay

## 2019-05-09 ENCOUNTER — Ambulatory Visit: Payer: PPO | Admitting: Cardiothoracic Surgery

## 2019-05-09 VITALS — BP 179/74 | HR 98 | Resp 16 | Ht 63.0 in | Wt 157.0 lb

## 2019-05-09 DIAGNOSIS — Z951 Presence of aortocoronary bypass graft: Secondary | ICD-10-CM | POA: Diagnosis not present

## 2019-05-09 DIAGNOSIS — I2510 Atherosclerotic heart disease of native coronary artery without angina pectoris: Secondary | ICD-10-CM

## 2019-05-09 NOTE — Progress Notes (Signed)
[address]       [name] [address]             [phone]      [name] [place] Medical Record #[id number] Date of Birth: 09-11-1941  Referring: Richardo Priest, MD Primary Care: Irven Shelling, MD Primary Cardiologist: Shirlee More, MD   Chief Complaint:   POST OP FOLLOW UP OPERATIVE REPORT DATE OF PROCEDURE:  12/11/2018 PREOPERATIVE DIAGNOSES:  Severe coronary occlusive disease with unstable angina. POSTOPERATIVE DIAGNOSIS:  Severe coronary occlusive disease with unstable angina. SURGICAL PROCEDURE:  Coronary artery bypass grafting x3 with the left internal mammary to the left anterior descending coronary artery, reverse saphenous vein graft to the obtuse marginal coronary artery, reverse saphenous vein graft to the distal right  coronary artery with right thigh and calf endoscopic vein harvesting of the right greater saphenous vein.  History of Present Illness:     Patient returns today in follow-up visit after having coronary artery bypass grafting on December 11, 2018.  She notes she is returned to her usual activities.  She denies shortness of breath or chest pain.  She notes that she works in her garden.  She is on chronic Xarelto for history of PEs.  She had some vague episodes of rapid heart rate recently and was restarted on amiodarone by Dr. Bettina Gavia.      Past Medical History:  Diagnosis Date  . Aftercare following surgery of the circulatory system, [place] 10/14/2013  . Anemia 12/13/2017  . Anxiety 12/13/2017  . Arthritis   . Carotid artery occlusion   . Clotting disorder ([place])   . Coronary artery disease   . Depression   . Dysphagia 04/17/2017  . GERD (gastroesophageal reflux disease) 05/26/2011  . GI bleeding 10/04/2017  . Gout 12/13/2017  . Hiatal hernia 12/13/2017  . 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   Marland Kitchen History of recurrent TIAs  12/13/2017  . History of right-sided carotid endarterectomy 10/10/2011  . Hyperlipidemia   . Hypertension   . Leg pain    with walking  . Occlusion and stenosis of carotid artery without mention of cerebral infarction 10/08/2012  . Osteoarthritis 05/26/2011  . Osteoporosis   . PE (pulmonary embolism) 05/26/2011  . 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[employer]) 04/17/2017  . Reflux   . Renal failure 12/13/2017  . S/P IVC filter 12/13/2017  . Schatzki's ring 12/13/2017  . Stroke [facility])    X's 4  . Valvular heart disease 12/13/2017     Social History   Tobacco Use  Smoking Status Never Smoker  Smokeless Tobacco Never Used    Social History   Substance and Sexual Activity  Alcohol Use No     No Known Allergies  Current Outpatient Medications  Medication Sig Dispense Refill  . acetaminophen (TYLENOL) 500 MG tablet Take 500-1,000 mg by mouth every 6 (six) hours as needed for moderate pain.     Marland Kitchen amiodarone (PACERONE) 200 MG tablet Take 2 tabs (400 mg) daily on Mon,Wed And FRi. Take 1 tab (200 mg) on Tues Thurs, Sat ,Sun 90 tablet 1  . amLODipine (NORVASC) 5 MG tablet Take 1 tablet (5 mg) daily every other day. 90 tablet 3  . aspirin EC 81 MG EC tablet Take 1 tablet (81 mg total) by mouth daily.    Marland Kitchen atorvastatin (LIPITOR) 40 MG tablet TAKE  1 TABLET BY MOUTH EVERY DAY AT 6PM 90 tablet 0  . citalopram (CELEXA) 10 MG tablet Take 10 mg by mouth at bedtime. Take with 40 mg to equal 50 mg at bedtime    . citalopram (CELEXA) 40 MG tablet Take 40 mg by mouth daily. Take with 10 mg to equal 50 mg at bedtime    . cloNIDine (CATAPRES) 0.1 MG tablet Take 1 tablet (0.1 mg total) by mouth daily as needed. If SBP (top number) is greater than 180 30 tablet 2  . furosemide (LASIX) 20 MG tablet Take 1 tablet (20 mg total) by mouth every other day. 48 tablet 1  . levothyroxine (SYNTHROID, LEVOTHROID) 125 MCG tablet Take 125 mcg by mouth at bedtime.   0  . linaclotide (LINZESS) 145 MCG CAPS capsule Take 145 mcg by  mouth every other day.     . metoprolol succinate (TOPROL-XL) 25 MG 24 hr tablet TAKE 1 TABLET(25 MG) BY MOUTH DAILY 90 tablet 0  . mirtazapine (REMERON) 30 MG tablet Take 30 mg by mouth at bedtime.     . Multiple Vitamin (MULTIVITAMIN) tablet Take 1 tablet by mouth at bedtime.     . Oxycodone HCl 10 MG TABS Take 10 mg by mouth every 6 (six) hours as needed (pain).     . pantoprazole (PROTONIX) 40 MG tablet TAKE 1 TABLET(40 MG) BY MOUTH TWICE DAILY 180 tablet 1  . potassium chloride SA (K-DUR) 20 MEQ tablet Take 1 Tab (69meq) every other day with lasix 90 tablet 2  . XARELTO 20 MG TABS tablet Take 20 mg by mouth daily.  0   No current facility-administered medications for this visit.        Physical Exam: BP (!) 179/74 (BP Location: Left Arm, Patient Position: Sitting, Cuff Size: Normal)   Pulse 98   Resp 16   Ht 5\' 3"  (1.6 m)   Wt 157 lb (71.2 kg)   SpO2 96% Comment: RA  BMI 27.81 kg/m   General appearance: alert, cooperative and no distress Neurologic: intact Heart: regular rate and rhythm, S1, S2 normal, no murmur, click, rub or gallop Lungs: clear to auscultation bilaterally Abdomen: soft, non-tender; bowel sounds normal; no masses,  no organomegaly Extremities: extremities normal, atraumatic, no cyanosis or edema and Homans sign is negative, no sign of DVT Wound: Sternum is stable and well-healed   Diagnostic Studies & Laboratory data:      Recent Lab Findings: Lab Results  Component Value Date   WBC 8.2 01/29/2019   HGB 11.3 01/29/2019   HCT 34.3 01/29/2019   PLT 419 01/29/2019   GLUCOSE 103 (H) 05/03/2019   CHOL 104 12/18/2018   TRIG 123 12/18/2018   HDL 35 (L) 12/18/2018   LDLCALC 44 12/18/2018   ALT 9 05/03/2019   AST 20 05/03/2019   NA 143 05/03/2019   K 4.4 05/03/2019   CL 104 05/03/2019   CREATININE 1.07 (H) 05/03/2019   BUN 26 05/03/2019   CO2 23 05/03/2019   TSH 0.087 (L) 05/03/2019   INR 1.4 (H) 12/11/2018   HGBA1C 5.5 12/10/2018       Assessment / Plan:   Patient with satisfactory progression after coronary artery bypass grafting in March 2020-she is not currently having evidence of heart failure or angina.  She she resumed amiodarone per cardiology presumably for atrial fibrillation episodic.  I will plan to see her back as needed she continues with cardiology follow-up   Medication Changes: No orders of the  defined types were placed in this encounter.     Tina Isaac MD      [address] [place] [address] Office [phone]   [facility] [phone]  05/09/2019 10:53 AM

## 2019-05-10 ENCOUNTER — Telehealth: Payer: Self-pay | Admitting: Cardiology

## 2019-05-10 IMAGING — CR CHEST - 2 VIEW
2 series · 2 of 2 positions shown · non-contrast
Comparison: Chest x-ray 12/15/2018.

CLINICAL DATA: 77-year-old female with history of CABG.

EXAM:
CHEST - 2 VIEW

[chest pa]
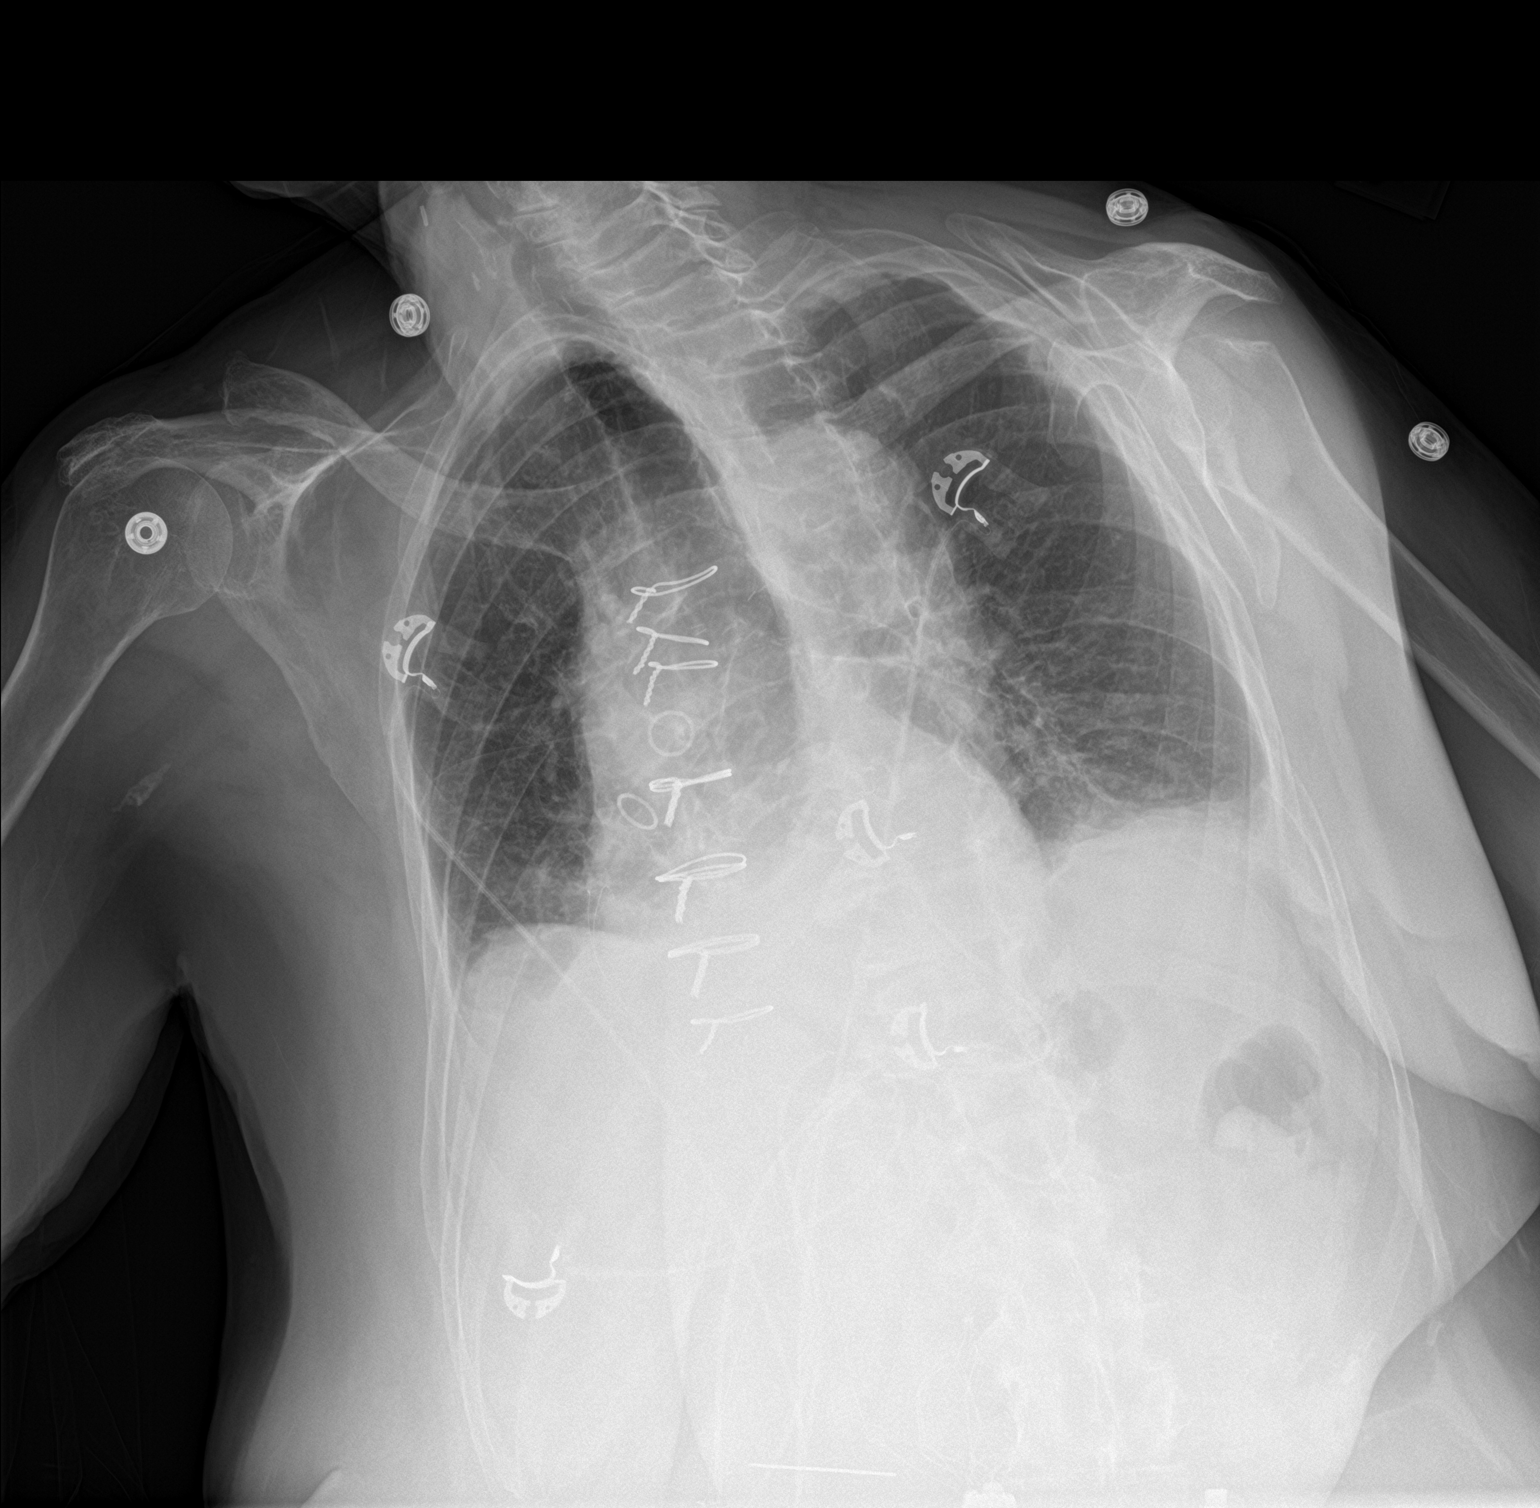

[chest lat]
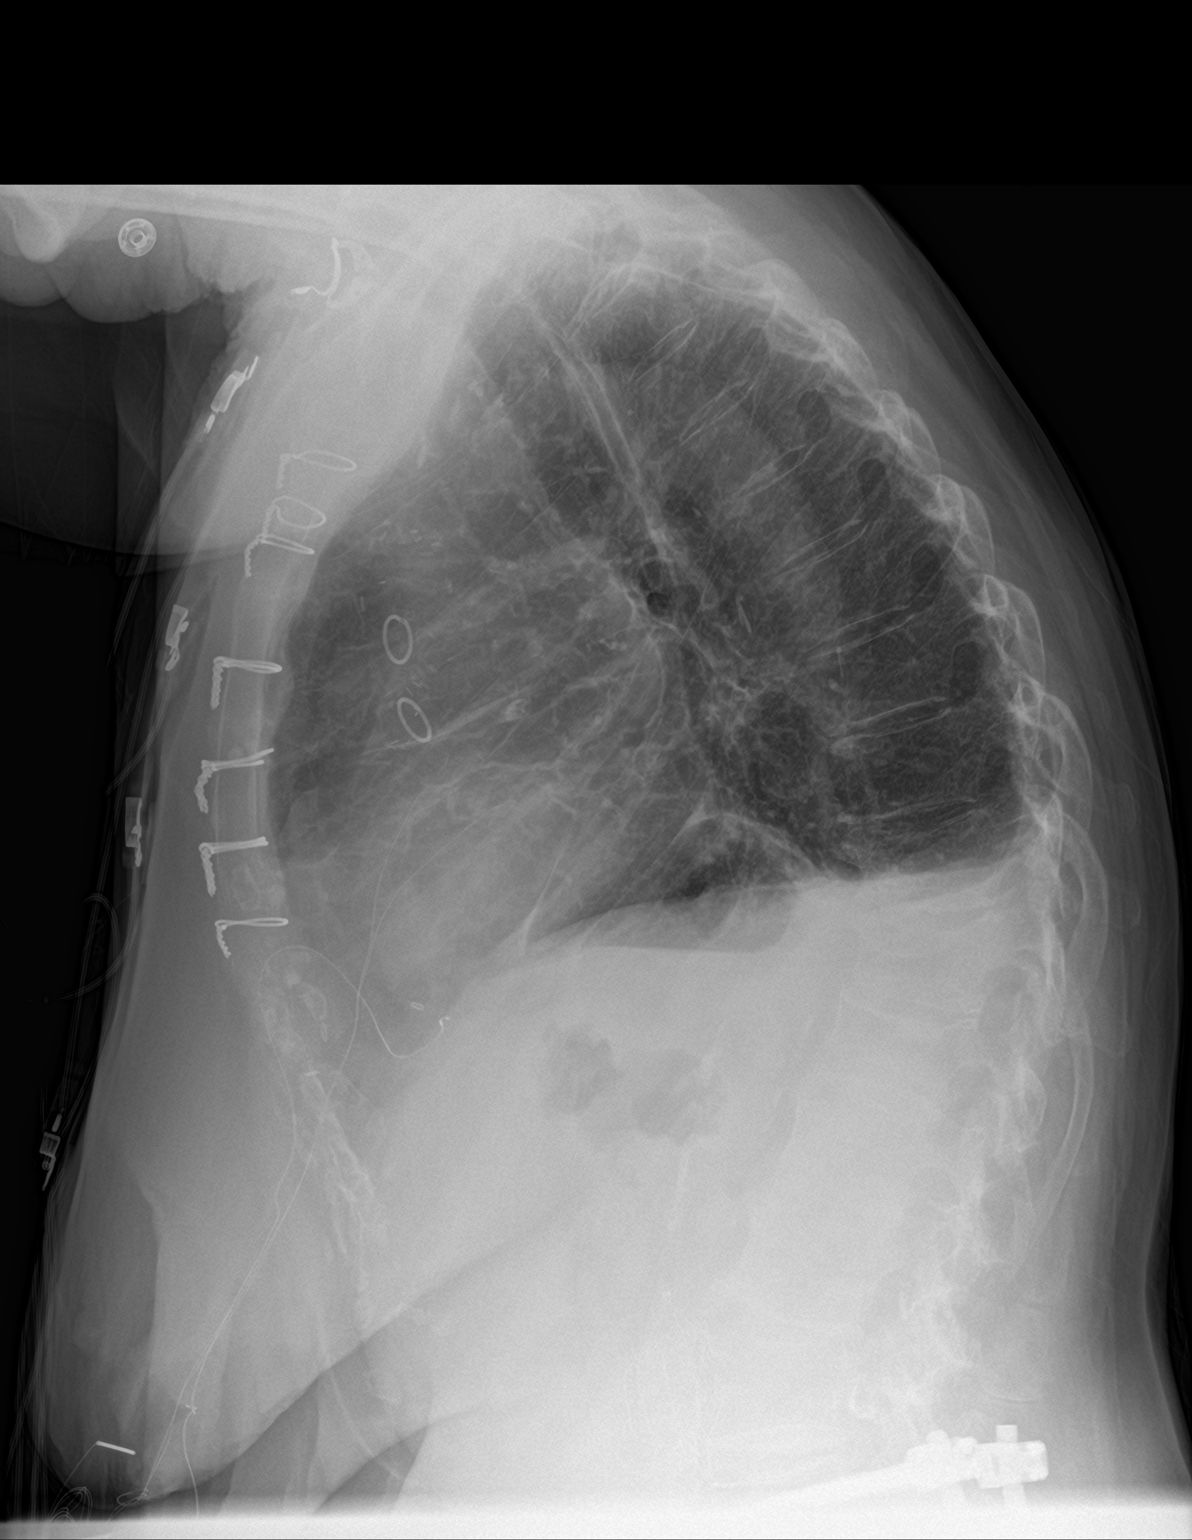

[2 of 2 positions shown; findings below may reference images not displayed]

FINDINGS: Previously noted right IJ catheter has been removed. Patient is
severely rotated to the right limiting the diagnostic sensitivity
and specificity of today's examination. With these limitations in
mind there is no definite consolidative airspace disease. Small
bilateral pleural effusions with associated areas of subsegmental
atelectasis are noted in the lung bases bilaterally. No evidence of
pulmonary edema. Heart size appears borderline enlarged. The patient
is rotated to the right on today's exam, resulting in distortion of
the mediastinal contours. Aortic atherosclerosis. Status post median
sternotomy for CABG including [REDACTED] to the LAD.
IMPRESSION: 1. Support apparatus and postoperative changes, as above.
2. Small bilateral pleural effusions with areas of subsegmental
atelectasis in the lung bases bilaterally.

## 2019-05-10 NOTE — Telephone Encounter (Signed)
Telephone call to patient. She is concerned because at her last office visit she mistakingly told them she was on amiodarone 200mg  daily. She was told to increase it to 400mg  on MWF and 200 mg on TTSS. She realized late rshe hs been off of amiodarone since May. Wants to know how she should take the amiodarone knowing this .

## 2019-05-10 NOTE — Telephone Encounter (Signed)
Take amiodarone 400 mg daily till Monday and check with Dr. Bettina Gavia on Monday.

## 2019-05-10 NOTE — Telephone Encounter (Signed)
Amiodarone 200mg  day

## 2019-05-10 NOTE — Telephone Encounter (Signed)
Telephone call to patient. Informed her to take 400 mg amiodarone until Monday and then check with Dr. Bettina Gavia. Pt verbalized understanding.

## 2019-05-13 MED ORDER — AMIODARONE HCL 200 MG PO TABS: ORAL_TABLET | ORAL | 1 refills | Status: DC

## 2019-05-13 NOTE — Telephone Encounter (Signed)
Telephone call to patient. Informed her to take amiodarone 200 mg daily per Dr. Bettina Gavia. Pt. Verbalized understanding.

## 2019-05-15 DIAGNOSIS — Z7901 Long term (current) use of anticoagulants: Secondary | ICD-10-CM | POA: Diagnosis not present

## 2019-05-15 DIAGNOSIS — E782 Mixed hyperlipidemia: Secondary | ICD-10-CM | POA: Diagnosis not present

## 2019-05-15 DIAGNOSIS — I10 Essential (primary) hypertension: Secondary | ICD-10-CM | POA: Diagnosis not present

## 2019-05-15 DIAGNOSIS — D6851 Activated protein C resistance: Secondary | ICD-10-CM | POA: Diagnosis not present

## 2019-05-15 DIAGNOSIS — E038 Other specified hypothyroidism: Secondary | ICD-10-CM | POA: Diagnosis not present

## 2019-05-31 ENCOUNTER — Other Ambulatory Visit: Payer: Self-pay | Admitting: Cardiology

## 2019-06-04 ENCOUNTER — Telehealth: Payer: Self-pay | Admitting: Cardiology

## 2019-06-04 MED ORDER — METOPROLOL SUCCINATE ER 25 MG PO TB24: ORAL_TABLET | ORAL | 0 refills | Status: DC

## 2019-06-04 NOTE — Telephone Encounter (Signed)
Metoprolol Succinate 25 mg daily refilled.

## 2019-06-04 NOTE — Telephone Encounter (Signed)
°*  STAT* If patient is at the pharmacy, call can be transferred to refill team.   1. Which medications need to be refilled? (please list name of each medication and dose if known) metoprolol succinate (TOPROL-XL)   2. Which pharmacy/location (including street and city if local pharmacy) is medication to be sent to?  [facility], [address] AT [phone] (Phone) [phone] (Fax)    3. Do they need a 30 day or 90 day supply?

## 2019-06-14 DIAGNOSIS — I10 Essential (primary) hypertension: Secondary | ICD-10-CM | POA: Diagnosis not present

## 2019-06-14 DIAGNOSIS — Z7901 Long term (current) use of anticoagulants: Secondary | ICD-10-CM | POA: Diagnosis not present

## 2019-06-14 DIAGNOSIS — E038 Other specified hypothyroidism: Secondary | ICD-10-CM | POA: Diagnosis not present

## 2019-06-14 DIAGNOSIS — E782 Mixed hyperlipidemia: Secondary | ICD-10-CM | POA: Diagnosis not present

## 2019-06-14 DIAGNOSIS — E119 Type 2 diabetes mellitus without complications: Secondary | ICD-10-CM | POA: Diagnosis not present

## 2019-06-14 DIAGNOSIS — E559 Vitamin D deficiency, unspecified: Secondary | ICD-10-CM | POA: Diagnosis not present

## 2019-06-14 DIAGNOSIS — D518 Other vitamin B12 deficiency anemias: Secondary | ICD-10-CM | POA: Diagnosis not present

## 2019-06-14 DIAGNOSIS — D6851 Activated protein C resistance: Secondary | ICD-10-CM | POA: Diagnosis not present

## 2019-06-19 DIAGNOSIS — E119 Type 2 diabetes mellitus without complications: Secondary | ICD-10-CM | POA: Diagnosis not present

## 2019-06-19 DIAGNOSIS — E038 Other specified hypothyroidism: Secondary | ICD-10-CM | POA: Diagnosis not present

## 2019-06-19 DIAGNOSIS — E559 Vitamin D deficiency, unspecified: Secondary | ICD-10-CM | POA: Diagnosis not present

## 2019-06-19 DIAGNOSIS — D518 Other vitamin B12 deficiency anemias: Secondary | ICD-10-CM | POA: Diagnosis not present

## 2019-07-08 ENCOUNTER — Telehealth: Payer: Self-pay | Admitting: *Deleted

## 2019-07-08 NOTE — Telephone Encounter (Signed)
Clarified instructions with Dr. Harriet Masson and called Gene with no answer, unable to leave message. Called patient and explained that she should hold amiodarone today and resume this medication tomorrow as prescribed. Advised her to monitor her heart rate tomorrow and if her HR drops into the 40's to contact our office for further recommendations. Informed patient if her symptoms worsen to go to the nearest emergency department to be evaluated. Patient is agreeable and verbalized understanding. Patient will keep her follow up appointment as scheduled with Dr. Bettina Gavia on 07/17/2019. No further questions.

## 2019-07-08 NOTE — Telephone Encounter (Signed)
Please ask her to be evaluated to the emergency department.  Also she does have an appointment with Dr. Bettina Gavia next week if for some reason she cannot wait for this appointment we can get her in earlier this week.

## 2019-07-08 NOTE — Telephone Encounter (Signed)
Patient's son, Gene, per DPR called with concern that patient's heart rate has been in the 40's recently (yesterday evening HR was 44) and patient has been having increased fatigue. "She just has no energy at all," he reports. Patient has had intermittent mild chest pain but denies any chest pain at this time. Patient's vital signs this morning are as follows: 98% O2, 61 HR, and 190/82 BP. Patient is taking all medications as prescribed and just took her morning medications 20 minutes ago. She look amlodipine and metoprolol succinate this morning as she alternates taking amlodipine one day and then furosemide the next and so on and so forth. Please advise. Thanks!

## 2019-07-09 ENCOUNTER — Telehealth: Payer: Self-pay

## 2019-07-09 NOTE — Telephone Encounter (Signed)
Patient states she was advised to call with updated HR for today. She has been checking it periodically today and it was between 36-55 BPM. RN consulted with Dr. Harriet Masson and advised patient to stop amiodarone until her visit with Dr. Bettina Gavia on 07/17/19. No further questions at this time.

## 2019-07-10 NOTE — Telephone Encounter (Signed)
Result notes

## 2019-07-15 DIAGNOSIS — Z5181 Encounter for therapeutic drug level monitoring: Secondary | ICD-10-CM | POA: Diagnosis not present

## 2019-07-15 DIAGNOSIS — R0989 Other specified symptoms and signs involving the circulatory and respiratory systems: Secondary | ICD-10-CM | POA: Diagnosis not present

## 2019-07-15 DIAGNOSIS — R590 Localized enlarged lymph nodes: Secondary | ICD-10-CM | POA: Diagnosis not present

## 2019-07-15 DIAGNOSIS — I10 Essential (primary) hypertension: Secondary | ICD-10-CM | POA: Diagnosis not present

## 2019-07-15 DIAGNOSIS — Z23 Encounter for immunization: Secondary | ICD-10-CM | POA: Diagnosis not present

## 2019-07-15 DIAGNOSIS — E038 Other specified hypothyroidism: Secondary | ICD-10-CM | POA: Diagnosis not present

## 2019-07-15 DIAGNOSIS — E782 Mixed hyperlipidemia: Secondary | ICD-10-CM | POA: Diagnosis not present

## 2019-07-15 DIAGNOSIS — Z86718 Personal history of other venous thrombosis and embolism: Secondary | ICD-10-CM | POA: Diagnosis not present

## 2019-07-16 NOTE — Progress Notes (Signed)
Cardiology Office Note:    Date:  07/17/2019   ID:  PAELYNN MOFFIT, DOB [birth year], MRN Tina Dominguez:[id number]  PCP:  Tina Nasuti, MD  Cardiologist:  Tina More, MD    Referring MD: Tina Nasuti, MD    ASSESSMENT:    1. PAF (paroxysmal atrial fibrillation) ([place])   2. On amiodarone therapy   3. Chronic anticoagulation   4. Chronic diastolic heart failure ([place])   5. Hypertensive heart disease with heart failure ([place])    PLAN:    In order of problems listed above:  1. She is at high risk of recurrent and persistent atrial fibrillation I told her I think what we should do is to resume low-dose amiodarone take a beta-blocker only for rapid heart rhythm apply a ZIO monitor and if she has significant bradycardia employ backup pacemaker to allow for effective antiarrhythmic therapy.  The patient is in agreement. 2. Resume amiodarone 200 mg daily take her Toprol only for persistent rates greater than 100 bpm 3. And your anticoagulant at high risk of recurrent atrial fibrillation and high stroke risk 4. Stable compensated continue her diuretic 5. Poorly controlled hypertension avoid rate slowing medications I asked her to start taking clonidine at bedtime track blood pressure at home and she will follow-up in the office in 4 weeks regarding blood pressure control and decision making regarding antiarrhythmic drug therapy and the potential need for pacemaker with sick sinus syndrome and symptomatic bradycardia   Next appointment: 4 weeks   Medication Adjustments/Labs and Tests Ordered: Current medicines are reviewed at length with the patient today.  Concerns regarding medicines are outlined above.  No orders of the defined types were placed in this encounter.  No orders of the defined types were placed in this encounter.   Chief Complaint  Patient presents with  . Follow-up  . Hypertension  . Coronary Artery Disease  . Atrial Fibrillation  . Anticoagulation  . Hyperlipidemia     History of Present Illness:    Tina Dominguez is a 78 y.o. 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  She is on amiodarone for atrial fibrillation She was last seen 05/03/2019 with labile hypertension and given a prescription for as needed clonidine and her heart failure was felt to be compensated. Compliance with diet, lifestyle and medications: Yes  Suddenly she felt weaker heart rates in the 30s and 40s and had near syncope.  In response to phone call amiodarone was discontinued.  Heart rates have been in the 50s to 60s since that time but she had one episode on Saturday where she thinks she was back in atrial fibrillation took amiodarone and broken about 30 minutes.  Her question to me is whether she should be taking amiodarone to avoid atrial fibrillation and whether she needs a backup pacemaker.  Had trouble with her blood pressure being up to [id number] systolic.  No edema chest pain shortness of breath she has not had syncope.  Is a complex visit 25 minutes in order to formulate a plan for treating hypertension discussed the issues related to sick sinus syndrome bradycardia recurrent atrial fibrillation antiarrhythmic drug therapy and the potential for pacemaker.  Shared decision making was utilized Past Medical History:  Diagnosis Date  . Aftercare following surgery of the circulatory system, [place] 10/14/2013  . Anemia 12/13/2017  . Anxiety 12/13/2017  . Arthritis   .  Carotid artery occlusion   . Clotting disorder ([place])   . Coronary artery disease   . Depression   . Dysphagia 04/17/2017  . GERD (gastroesophageal reflux disease) 05/26/2011  . GI bleeding 10/04/2017  . Gout 12/13/2017  . Hiatal hernia 12/13/2017  . 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   Tina Dominguez History of recurrent TIAs 12/13/2017  .  History of right-sided carotid endarterectomy 10/10/2011  . Hyperlipidemia   . Hypertension   . Leg pain    with walking  . Occlusion and stenosis of carotid artery without mention of cerebral infarction 10/08/2012  . Osteoarthritis 05/26/2011  . Osteoporosis   . PE (pulmonary embolism) 05/26/2011  . 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[place]) 04/17/2017  . Reflux   . Renal failure 12/13/2017  . S/P IVC filter 12/13/2017  . Schatzki's ring 12/13/2017  . Stroke [facility])    X's 4  . Valvular heart disease 12/13/2017    Past Surgical History:  Procedure Laterality Date  . ABDOMINAL HYSTERECTOMY  1980   TAH   . APPENDECTOMY    . BACK SURGERY  06/2011  . BREAST SURGERY     x2  . CAROTID ENDARTERECTOMY  11/11/2010   Right CEA  . CORONARY ARTERY BYPASS GRAFT N/A 12/11/2018   Procedure: CORONARY ARTERY BYPASS GRAFTING (CABG) x three , using left internal mammary artery, and right leg greater saphenous vein harvested endoscopically - SVG to OM1, SVG to Distal RCA;  Surgeon: Grace Isaac, MD;  Location: [facility];  Service: Open Heart Surgery;  Laterality: N/A;  . IVC FILTER INSERTION    . JOINT REPLACEMENT Right 2005   Right Total Knee  . JOINT REPLACEMENT Left 2005   Left Total Knee  . LEFT HEART CATH AND CORONARY ANGIOGRAPHY N/A 12/10/2018   Procedure: LEFT HEART CATH AND CORONARY ANGIOGRAPHY;  Surgeon: Jettie Booze, MD;  Location: [facility];  Service: Cardiovascular;  Laterality: N/A;  . SHOULDER SURGERY     right  . SPINE SURGERY  07-11-11   Decomp. Laminectomy, fusion of L4-5,L5-S1 by Dr. Saintclair Halsted  . TEE WITHOUT CARDIOVERSION N/A 12/11/2018   Procedure: TRANSESOPHAGEAL ECHOCARDIOGRAM (TEE);  Surgeon: Grace Isaac, MD;  Location: [facility];  Service: Open Heart Surgery;  Laterality: N/A;  . TOTAL KNEE ARTHROPLASTY     right  and left    Current Medications: Current Meds  Medication Sig  . acetaminophen (TYLENOL) 500 MG tablet Take 500-1,000 mg by mouth every 6 (six) hours as  needed for moderate pain.   Tina Dominguez amLODipine (NORVASC) 5 MG tablet Take 1 tablet (5 mg) daily every other day.  Tina Dominguez aspirin EC 81 MG EC tablet Take 1 tablet (81 mg total) by mouth daily.  Tina Dominguez atorvastatin (LIPITOR) 40 MG tablet TAKE 1 TABLET BY MOUTH EVERY DAY AT 6PM  . citalopram (CELEXA) 10 MG tablet Take 10 mg by mouth at bedtime. Take with 40 mg to equal 50 mg at bedtime  . citalopram (CELEXA) 40 MG tablet Take 40 mg by mouth daily. Take with 10 mg to equal 50 mg at bedtime  . cloNIDine (CATAPRES) 0.1 MG tablet Take 1 tablet (0.1 mg total) by mouth daily as needed. If SBP (top number) is greater than 180  . furosemide (LASIX) 20 MG tablet Take 1 tablet (20 mg total) by mouth every other day.  . levothyroxine (SYNTHROID, LEVOTHROID) 125 MCG tablet  Take 125 mcg by mouth at bedtime.   Tina Dominguez linaclotide (LINZESS) 145 MCG CAPS capsule Take 145 mcg by mouth every other day.   . metoprolol succinate (TOPROL-XL) 25 MG 24 hr tablet TAKE 1 TABLET(25 MG) BY MOUTH DAILY  . mirtazapine (REMERON) 30 MG tablet Take 30 mg by mouth at bedtime.   . Multiple Vitamin (MULTIVITAMIN) tablet Take 1 tablet by mouth at bedtime.   . Oxycodone HCl 10 MG TABS Take 10 mg by mouth every 6 (six) hours as needed (pain).   . pantoprazole (PROTONIX) 40 MG tablet TAKE 1 TABLET(40 MG) BY MOUTH TWICE DAILY  . potassium chloride SA (K-DUR) 20 MEQ tablet Take 1 Tab (75meq) every other day with lasix  . XARELTO 20 MG TABS tablet Take 20 mg by mouth daily.     Allergies:   Patient has no known allergies.   Social History   Socioeconomic History  . Marital status: Widowed    Spouse name: Not on file  . Number of children: Not on file  . Years of education: Not on file  . Highest education level: Not on file  Occupational History  . Not on file  Social Needs  . Financial resource strain: Not on file  . Food insecurity    Worry: Not on file    Inability: Not on file  . Transportation needs    Medical: Not on file    Non-medical:  Not on file  Tobacco Use  . Smoking status: Never Smoker  . Smokeless tobacco: Never Used  Substance and Sexual Activity  . Alcohol use: No  . Drug use: No  . Sexual activity: Not on file  Lifestyle  . Physical activity    Days per week: Not on file    Minutes per session: Not on file  . Stress: Not on file  Relationships  . Social Herbalist on phone: Not on file    Gets together: Not on file    Attends religious service: Not on file    Active member of club or organization: Not on file    Attends meetings of clubs or organizations: Not on file    Relationship status: Not on file  Other Topics Concern  . Not on file  Social History Narrative  . Not on file     Family History: The patient's family history includes Clotting disorder in her mother; Deep vein thrombosis in her mother and sister; Diabetes in her mother and sister; Heart disease in her brother and mother; Heart disease (age of onset: 51) in her father; Hyperlipidemia in her mother; Hypertension in her mother; Stroke in her maternal grandmother; Varicose Veins in her father. ROS:   Please see the history of present illness.    All other systems reviewed and are negative.  EKGs/Labs/Other Studies Reviewed:    The following studies were reviewed today:  EKG:  EKG ordered today and personally reviewed.  The ekg ordered today demonstrates sinus rhythm first-degree AV block poor R wave progression normal QT interval  Recent Labs: 12/14/2018: Magnesium 1.9 01/29/2019: Hemoglobin 11.3; NT-Pro BNP 1,163; Platelets 419 05/03/2019: ALT 9; BUN 26; Creatinine, Ser 1.07; Potassium 4.4; Sodium 143; TSH 0.087  Recent Lipid Panel    Component Value Date/Time   CHOL 104 12/18/2018 0246   TRIG 123 12/18/2018 0246   HDL 35 (L) 12/18/2018 0246   CHOLHDL 3.0 12/18/2018 0246   VLDL 25 12/18/2018 0246   LDLCALC 44 12/18/2018 0246  Physical Exam:    VS:  BP (!) 182/92 (BP Location: Right Arm, Patient Position:  Sitting, Cuff Size: Normal)   Pulse (!) 47   Ht 5\' 3"  (1.6 m)   Wt 158 lb 12.8 oz (72 kg)   SpO2 98%   BMI 28.13 kg/m     Wt Readings from Last 3 Encounters:  07/17/19 158 lb 12.8 oz (72 kg)  05/09/19 157 lb (71.2 kg)  05/03/19 157 lb (71.2 kg)     GEN:  Well nourished, well developed in no acute distress HEENT: Normal NECK: No JVD; No carotid bruits LYMPHATICS: No lymphadenopathy CARDIAC: RRR, no murmurs, rubs, gallops RESPIRATORY:  Clear to auscultation without rales, wheezing or rhonchi  ABDOMEN: Soft, non-tender, non-distended MUSCULOSKELETAL:  No edema; No deformity  SKIN: Warm and dry NEUROLOGIC:  Alert and oriented x 3 PSYCHIATRIC:  Normal affect    Signed, Tina More, MD  07/17/2019 11:00 AM    [facility]

## 2019-07-17 ENCOUNTER — Other Ambulatory Visit: Payer: Self-pay

## 2019-07-17 ENCOUNTER — Encounter: Payer: Self-pay | Admitting: *Deleted

## 2019-07-17 ENCOUNTER — Encounter: Payer: Self-pay | Admitting: Cardiology

## 2019-07-17 ENCOUNTER — Ambulatory Visit (INDEPENDENT_AMBULATORY_CARE_PROVIDER_SITE_OTHER): Payer: PPO | Admitting: Cardiology

## 2019-07-17 ENCOUNTER — Ambulatory Visit (INDEPENDENT_AMBULATORY_CARE_PROVIDER_SITE_OTHER): Payer: PPO

## 2019-07-17 VITALS — BP 182/92 | HR 47 | Ht 63.0 in | Wt 158.8 lb

## 2019-07-17 DIAGNOSIS — I480 Paroxysmal atrial fibrillation: Secondary | ICD-10-CM | POA: Diagnosis not present

## 2019-07-17 DIAGNOSIS — I5032 Chronic diastolic (congestive) heart failure: Secondary | ICD-10-CM | POA: Diagnosis not present

## 2019-07-17 DIAGNOSIS — D492 Neoplasm of unspecified behavior of bone, soft tissue, and skin: Secondary | ICD-10-CM | POA: Diagnosis not present

## 2019-07-17 DIAGNOSIS — I110 Hypertensive heart disease with heart failure: Secondary | ICD-10-CM

## 2019-07-17 DIAGNOSIS — Z79899 Other long term (current) drug therapy: Secondary | ICD-10-CM

## 2019-07-17 DIAGNOSIS — Z7901 Long term (current) use of anticoagulants: Secondary | ICD-10-CM | POA: Diagnosis not present

## 2019-07-17 DIAGNOSIS — I714 Abdominal aortic aneurysm, without rupture: Secondary | ICD-10-CM | POA: Diagnosis not present

## 2019-07-17 MED ORDER — AMIODARONE HCL 200 MG PO TABS: 200.0000 mg | ORAL_TABLET | Freq: Every day | ORAL | 1 refills | Status: DC

## 2019-07-17 MED ORDER — CLONIDINE HCL 0.1 MG PO TABS: 0.1000 mg | ORAL_TABLET | Freq: Every day | ORAL | 2 refills | Status: DC

## 2019-07-17 MED ORDER — METOPROLOL SUCCINATE ER 25 MG PO TB24: ORAL_TABLET | ORAL | 0 refills | Status: DC

## 2019-07-17 NOTE — Patient Instructions (Addendum)
Medication Instructions:  Your physician has recommended you make the following change in your medication:  CHANGE Clonidine to 0.1mg  (one tablet) daily at bedtime.  CHANGE Metoprolol 25mg  to take as-needed for heart rates greater than 100 bpm.  START Amiodarone 200mg  (one tablet) daily.  *If you need a refill on your cardiac medications before your next appointment, please call your pharmacy*  Lab Work: No lab work today.  If you have labs (blood work) drawn today and your tests are completely normal, you will receive your results only by: Marland Kitchen MyChart Message (if you have MyChart) OR . A paper copy in the mail If you have any lab test that is abnormal or we need to change your treatment, we will call you to review the results.  Testing/Procedures: You had an EKG today.  Your provider has ordered a 14 day ZIO telemetry monitor. Please wear from 07/17/19 - 07/31/19. One 07/31/19 please return the monitor by mail in the box you received during your office visit. Be sure to include your symptom diary. Press the button for symptoms including dizziness, palpitations, atrial fib, chest pain, etc. You may shower with the monitor on, but avoid hot tubs, baths, swimming. Please press the adhesive on the monitor every day as this will help it to stay in place.  Follow-Up: At [facility], you and your health needs are our priority.  As part of our continuing mission to provide you with exceptional heart care, we have created designated Provider Care Teams.  These Care Teams include your primary Cardiologist (physician) and Advanced Practice Providers (APPs -  Physician Assistants and Nurse Practitioners) who all work together to provide you with the care you need, when you need it.  Your next appointment:   1 month  The format for your next appointment:   In Person  Provider:   You may see Shirlee More, MD or the following Advanced Practice Provider on your designated Care Team:    Laurann Montana, FNP

## 2019-07-17 NOTE — Addendum Note (Signed)
Addended by: Austin Miles on: 07/17/2019 11:22 AM   Modules accepted: Orders

## 2019-07-17 NOTE — Addendum Note (Signed)
Addended by: Loel Dubonnet on: 07/17/2019 11:14 AM   Modules accepted: Orders

## 2019-08-05 ENCOUNTER — Telehealth: Payer: Self-pay | Admitting: Cardiology

## 2019-08-05 MED ORDER — ATORVASTATIN CALCIUM 40 MG PO TABS: ORAL_TABLET | ORAL | 0 refills | Status: DC

## 2019-08-05 NOTE — Telephone Encounter (Signed)
Patient reports that she has had elevated heart rate episodes almost daily for the past 2 weeks. These episodes last approximately 5-20 minutes. She has taken metoprolol succinate 25 mg only 3-4 times since these episodes have been happening. Dr. Bettina Gavia advised patient's follow up appointment with Urban Gibson, NP be moved up for sooner evaluation. Patient is agreeable to going to the [facility] office tomorrow morning, 08/06/2019, at 11:15 am to see Urban Gibson, NP. No further questions.

## 2019-08-05 NOTE — Telephone Encounter (Signed)
Please call regarding afib, should metoprolol be increased?

## 2019-08-05 NOTE — Telephone Encounter (Signed)
°*  STAT* If patient is at the pharmacy, call can be transferred to refill team.   1. Which medications need to be refilled? (please list name of each medication and dose if known) Atorvastatin 40mg   2. Which pharmacy/location (including street and city if local pharmacy) is medication to be sent to?[facility]  3. Do they need a 30 day or 90 day supply? [place]

## 2019-08-05 NOTE — Telephone Encounter (Signed)
Left message to return call 

## 2019-08-05 NOTE — Telephone Encounter (Signed)
Refill for atorvastatin sent to [facility] in [place] as requested.

## 2019-08-06 ENCOUNTER — Other Ambulatory Visit: Payer: Self-pay

## 2019-08-06 ENCOUNTER — Encounter: Payer: Self-pay | Admitting: Family

## 2019-08-06 ENCOUNTER — Telehealth: Payer: Self-pay | Admitting: Family

## 2019-08-06 ENCOUNTER — Ambulatory Visit (INDEPENDENT_AMBULATORY_CARE_PROVIDER_SITE_OTHER): Payer: PPO | Admitting: Family

## 2019-08-06 ENCOUNTER — Other Ambulatory Visit: Payer: Self-pay | Admitting: Family

## 2019-08-06 VITALS — BP 170/80 | HR 59 | Resp 18 | Ht 63.0 in | Wt 160.8 lb

## 2019-08-06 DIAGNOSIS — I110 Hypertensive heart disease with heart failure: Secondary | ICD-10-CM

## 2019-08-06 DIAGNOSIS — I480 Paroxysmal atrial fibrillation: Secondary | ICD-10-CM

## 2019-08-06 DIAGNOSIS — E039 Hypothyroidism, unspecified: Secondary | ICD-10-CM

## 2019-08-06 DIAGNOSIS — Z7901 Long term (current) use of anticoagulants: Secondary | ICD-10-CM

## 2019-08-06 DIAGNOSIS — I25118 Atherosclerotic heart disease of native coronary artery with other forms of angina pectoris: Secondary | ICD-10-CM

## 2019-08-06 MED ORDER — AMLODIPINE BESYLATE 5 MG PO TABS: 5.0000 mg | ORAL_TABLET | Freq: Every day | ORAL | 3 refills | Status: DC

## 2019-08-06 MED ORDER — METOPROLOL TARTRATE 25 MG PO TABS: ORAL_TABLET | ORAL | 0 refills | Status: DC

## 2019-08-06 NOTE — Telephone Encounter (Signed)
Asked by patient to call her son Gene to review changes made at office visit. Plan of care discussed with Gene. All questions answered. He verbalizes understanding of the plan of care and medication changes. See office note for details.   Loel Dubonnet, NP

## 2019-08-06 NOTE — Patient Instructions (Signed)
Medication Instructions:  Your physician has recommended you make the following change in your medication:  CHANGE Amlodipine to 5mg  (one tablet) daily  STOP Metoprolol Succinate (Toprol XL)   START Metoprolol Tartrate (short acting Metoprolol)  12.5mg  (half tablet) daily in the morning. Hold this medication if your heart rate is <100.  *If you need a refill on your cardiac medications before your next appointment, please call your pharmacy*  Lab Work: Your physician recommends that you return for lab work: BMET, thyroid panel  If you have labs (blood work) drawn today and your tests are completely normal, you will receive your results only by: Marland Kitchen MyChart Message (if you have MyChart) OR . A paper copy in the mail If you have any lab test that is abnormal or we need to change your treatment, we will call you to review the results.  Testing/Procedures: None today  Follow-Up: At [facility], you and your health needs are our priority.  As part of our continuing mission to provide you with exceptional heart care, we have created designated Provider Care Teams.  These Care Teams include your primary Cardiologist (physician) and Advanced Practice Providers (APPs -  Physician Assistants and Nurse Practitioners) who all work together to provide you with the care you need, when you need it.  Your next appointment:   1 month  The format for your next appointment:   In Person  Provider:   Shirlee More, MD  Other Instructions  We will call you with your monitor results when they arrive.   When we call please let us know how you are doing on the medication changes.

## 2019-08-06 NOTE — Progress Notes (Signed)
Office Visit    Patient Name: Tina Dominguez Date of Encounter: 08/06/2019  Primary Care Provider:  Bonnita Nasuti, MD Primary Cardiologist:  Shirlee More, MD Electrophysiologist:  None   Chief Complaint    Tina Dominguez is a 78 y.o. 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   Past Medical History    Past Medical History:  Diagnosis Date  . Aftercare following surgery of the circulatory system, [place] 10/14/2013  . Anemia 12/13/2017  . Anxiety 12/13/2017  . Arthritis   . Carotid artery occlusion   . Clotting disorder ([place])   . Coronary artery disease   . Depression   . Dysphagia 04/17/2017  . GERD (gastroesophageal reflux disease) 05/26/2011  . GI bleeding 10/04/2017  . Gout 12/13/2017  . Hiatal hernia 12/13/2017  . 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   Marland Kitchen History of recurrent TIAs 12/13/2017  . History of right-sided carotid endarterectomy 10/10/2011  . Hyperlipidemia   . Hypertension   . Leg pain    with walking  . Occlusion and stenosis of carotid artery without mention of cerebral infarction 10/08/2012  . Osteoarthritis 05/26/2011  . Osteoporosis   . PE (pulmonary embolism) 05/26/2011  . 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[place]) 04/17/2017  . Reflux   . Renal failure 12/13/2017  . S/P IVC filter 12/13/2017  . Schatzki's ring 12/13/2017  . Stroke [facility])    X's 4  . Valvular heart disease 12/13/2017   Past Surgical History:  Procedure Laterality Date  . ABDOMINAL HYSTERECTOMY  1980   TAH   . APPENDECTOMY    . BACK SURGERY  06/2011  . BREAST SURGERY     x2  . CAROTID ENDARTERECTOMY  11/11/2010   Right CEA  . CORONARY ARTERY BYPASS GRAFT N/A 12/11/2018   Procedure: CORONARY ARTERY BYPASS GRAFTING (CABG) x three , using left internal  mammary artery, and right leg greater saphenous vein harvested endoscopically - SVG to OM1, SVG to Distal RCA;  Surgeon: Grace Isaac, MD;  Location: [place];  Service: Open Heart Surgery;  Laterality: N/A;  . IVC FILTER INSERTION    . JOINT REPLACEMENT Right 2005   Right Total Knee  . JOINT REPLACEMENT Left 2005   Left Total Knee  . LEFT HEART CATH AND CORONARY ANGIOGRAPHY N/A 12/10/2018   Procedure: LEFT HEART CATH AND CORONARY ANGIOGRAPHY;  Surgeon: Jettie Booze, MD;  Location: [place] CV LAB;  Service: Cardiovascular;  Laterality: N/A;  . SHOULDER SURGERY     right  . SPINE SURGERY  07-11-11   Decomp. Laminectomy, fusion of L4-5,L5-S1 by Dr. Saintclair Halsted  . TEE WITHOUT CARDIOVERSION N/A 12/11/2018   Procedure: TRANSESOPHAGEAL ECHOCARDIOGRAM (TEE);  Surgeon: Grace Isaac, MD;  Location: [place];  Service: Open Heart Surgery;  Laterality: N/A;  . TOTAL KNEE ARTHROPLASTY     right  and left    Allergies  No Known Allergies  History of Present Illness    Tina Dominguez is a 78 y.o. 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 Bettina Gavia.  Her hypertension has  been difficult to control with systolic BP as high as [id number]. Her heart rates have been difficult to control with HR as low as 30s-40s with near syncope, Amiodarone subsequently stopped, questionable recurrence of atrial fibrillation. At office visit 07/17/19 her Metoprolol was changed to as-needed for persistent HR >100 and her Amiodarone was resumed.   Tells me she has had elevated heart rates in the morning. 112-160bpm when checked on her pulse oximetry. This lasts from a few minutes up to half an hour. She has to sit and rest as it makes her feel short of breath. One time it lasted an hour and she called the paramedics.   Reports her BP has been elevated when checked intermittently at home.  170s-200s/100s. She has been hesitant to check it as it has been high.   EKGs/Labs/Other Studies Reviewed:   The following studies were reviewed today:  EKG:  No EKG today.  Recent Labs: 12/14/2018: Magnesium 1.9 01/29/2019: Hemoglobin 11.3; NT-Pro BNP 1,163; Platelets 419 05/03/2019: ALT 9; BUN 26; Creatinine, Ser 1.07; Potassium 4.4; Sodium 143; TSH 0.087  Recent Lipid Panel    Component Value Date/Time   CHOL 104 12/18/2018 0246   TRIG 123 12/18/2018 0246   HDL 35 (L) 12/18/2018 0246   CHOLHDL 3.0 12/18/2018 0246   VLDL 25 12/18/2018 0246   LDLCALC 44 12/18/2018 0246    Home Medications   Current Meds  Medication Sig  . acetaminophen (TYLENOL) 500 MG tablet Take 500-1,000 mg by mouth every 6 (six) hours as needed for moderate pain.   Marland Kitchen amiodarone (PACERONE) 200 MG tablet Take 1 tablet (200 mg total) by mouth daily.  Marland Kitchen amLODipine (NORVASC) 5 MG tablet Take 1 tablet (5 mg total) by mouth daily.  Marland Kitchen aspirin EC 81 MG EC tablet Take 1 tablet (81 mg total) by mouth daily.  Marland Kitchen atorvastatin (LIPITOR) 40 MG tablet TAKE 1 TABLET BY MOUTH EVERY DAY AT 6PM  . citalopram (CELEXA) 10 MG tablet Take 10 mg by mouth at bedtime. Take with 40 mg to equal 50 mg at bedtime  . citalopram (CELEXA) 40 MG tablet Take 40 mg by mouth daily. Take with 10 mg to equal 50 mg at bedtime  . cloNIDine (CATAPRES) 0.1 MG tablet Take 1 tablet (0.1 mg total) by mouth at bedtime.  . furosemide (LASIX) 20 MG tablet Take 1 tablet (20 mg total) by mouth every other day.  . levothyroxine (SYNTHROID, LEVOTHROID) 125 MCG tablet Take 125 mcg by mouth at bedtime.   Marland Kitchen linaclotide (LINZESS) 145 MCG CAPS capsule Take 145 mcg by mouth every other day.   . mirtazapine (REMERON) 30 MG tablet Take 30 mg by mouth at bedtime.   . Multiple Vitamin (MULTIVITAMIN) tablet Take 1 tablet by mouth at bedtime.   . Oxycodone HCl 10 MG TABS Take 10 mg by mouth every 6 (six) hours as needed (pain).   . pantoprazole (PROTONIX) 40 MG tablet TAKE 1  TABLET(40 MG) BY MOUTH TWICE DAILY  . potassium chloride SA (K-DUR) 20 MEQ tablet Take 1 Tab (48meq) every other day with lasix  . XARELTO 20 MG TABS tablet Take 20 mg by mouth daily.  . [DISCONTINUED] amLODipine (NORVASC) 5 MG tablet Take 1 tablet (5 mg) daily every other day.  . [DISCONTINUED] metoprolol succinate (TOPROL-XL) 25 MG 24 hr tablet Take 1 tablet as needed for heart rates >100bpm      Review of Systems      Review of Systems  Constitution: Negative for chills,  fever and malaise/fatigue.  Cardiovascular: Positive for irregular heartbeat and palpitations. Negative for chest pain, cyanosis, dyspnea on exertion, leg swelling and near-syncope.  Respiratory: Negative for cough, shortness of breath and wheezing.   Gastrointestinal: Negative for nausea and vomiting.  Neurological: Negative for dizziness, light-headedness and weakness.   All other systems reviewed and are otherwise negative except as noted above.  Physical Exam    VS:  BP (!) 170/80 (BP Location: Left Arm, Patient Position: Sitting, Cuff Size: Normal)   Pulse (!) 59   Resp 18   Ht 5\' 3"  (1.6 m)   Wt 160 lb 12 oz (72.9 kg)   SpO2 94%   BMI 28.48 kg/m  , BMI Body mass index is 28.48 kg/m. GEN: Well nourished, well developed, in no acute distress. HEENT: normal. Neck: Supple, no JVD, carotid bruits, or masses. Cardiac: RRR, no murmurs, rubs, or gallops. No clubbing, cyanosis, edema.  Radials/DP/PT 2+ and equal bilaterally.  Respiratory:  Respirations regular and unlabored, clear to auscultation bilaterally. GI: Soft, nontender, nondistended, BS + x 4. MS: No deformity or atrophy. Skin: Warm and dry, no rash. Neuro:  Strength and sensation are intact. Psych: Normal affect.   Assessment & Plan    1. PAF - ZIO monitor in transit back to company, await results. Her heart rate has been difficult to control. She was bradycardic with HR 30-40 switched to PRN Toprol XL however now reports elevated HR in the  mornings 112-160 associated with SOB. Did call paramedics one time last week for monitoring and they did not recommend transport to hospital as resolved. Denies excessive caffeine use, does not smoke, takes no proarrthymic medications.  TSH, BMET today. TSH abnormal on last check 04/2019, may be contributory.   STOP PRN Toprol XL  START Metorpolol 12.5mg  daily in the morning. She will hold if HR <100.   2. 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 Continue Clonidine 0.1mg  QHS. Increase Amlodipine to 5mg  daily, was previously decreased to every other day when BP was better controlled in April.   Chronic diastolic heart failure- Euvolemic on exam. No LE edema. No DOE. NYHA I-II. Continue present Lasix and potassium dosing.   3. CAD s/p CABG - Stable with no anginal symptoms. Continue aspirin, statin, beta blocker. Beta blocker adjusted today, as above.    4. Chronic anticoagulation - Secondary to PAF, Factor V Leiden. Denies bleeding complications. Continue Xarelto 20mg  daily.  5. Amiodarone therapy - No signs of lung toxicity. Normal liver function, Amiodarone level 0.4 05/03/19. Recheck thyroid panel today as TSH 0.087 on 05/03/19.  Disposition: Follow up in 1 month(s) with Dr. Earney Navy, NP 08/06/2019, 12:12 PM

## 2019-08-07 ENCOUNTER — Encounter: Payer: Self-pay | Admitting: Family

## 2019-08-07 LAB — BASIC METABOLIC PANEL
BUN/Creatinine Ratio: 22 (ref 12–28)
BUN: 23 mg/dL (ref 8–27)
CO2: 21 mmol/L (ref 20–29)
Calcium: 9.3 mg/dL (ref 8.7–10.3)
Chloride: 108 mmol/L — ABNORMAL HIGH (ref 96–106)
Creatinine, Ser: 1.04 mg/dL — ABNORMAL HIGH (ref 0.57–1.00)
GFR calc Af Amer: 61 mL/min/{1.73_m2} (ref >59)
GFR calc non Af Amer: 53 mL/min/{1.73_m2} — ABNORMAL LOW (ref >59)
Glucose: 90 mg/dL (ref 65–99)
Potassium: 4.7 mmol/L (ref 3.5–5.2)
Sodium: 144 mmol/L (ref 134–144)

## 2019-08-07 LAB — THYROID PANEL WITH TSH
Free Thyroxine Index: 3.8 (ref 1.2–4.9)
T3 Uptake Ratio: 30 % (ref 24–39)
T4, Total: 12.6 ug/dL — ABNORMAL HIGH (ref 4.5–12.0)
TSH: 2.71 u[IU]/mL (ref 0.450–4.500)

## 2019-08-10 DIAGNOSIS — I480 Paroxysmal atrial fibrillation: Secondary | ICD-10-CM | POA: Diagnosis not present

## 2019-08-11 ENCOUNTER — Telehealth: Payer: Self-pay | Admitting: Physician Assistant

## 2019-08-11 NOTE — Telephone Encounter (Signed)
Tina Dominguez is a 79 y.o. female with paroxysmal AF, CAD s/p CABG, carotid stenosis s/p CEA, hypertension, prior CVA, Amiodarone rx, Factor V Leiden mutation.    She wore a Zio patch recently.  I was called with the following results: Bradycardia with HR 39 x 30 seconds on 07/23/2019 SVT at rate 100 for 6 beats over 3.7 sec on 07/24/2019.  Richardson Dopp, PA-C    08/11/2019 10:29 AM

## 2019-08-12 NOTE — Telephone Encounter (Signed)
Is the Zio available?

## 2019-08-12 NOTE — Telephone Encounter (Signed)
I just pulled this and sent it to you.

## 2019-08-14 ENCOUNTER — Ambulatory Visit: Payer: PPO | Admitting: Family

## 2019-08-16 DIAGNOSIS — Z5181 Encounter for therapeutic drug level monitoring: Secondary | ICD-10-CM | POA: Diagnosis not present

## 2019-08-16 DIAGNOSIS — I10 Essential (primary) hypertension: Secondary | ICD-10-CM | POA: Diagnosis not present

## 2019-08-16 DIAGNOSIS — M064 Inflammatory polyarthropathy: Secondary | ICD-10-CM | POA: Diagnosis not present

## 2019-08-16 DIAGNOSIS — E782 Mixed hyperlipidemia: Secondary | ICD-10-CM | POA: Diagnosis not present

## 2019-08-16 DIAGNOSIS — E038 Other specified hypothyroidism: Secondary | ICD-10-CM | POA: Diagnosis not present

## 2019-09-02 ENCOUNTER — Telehealth: Payer: Self-pay | Admitting: Cardiology

## 2019-09-02 NOTE — Telephone Encounter (Signed)
Called patient to report that EKG strips were received and reviewed by Dr. Bettina Gavia. Patient informed that Dr. Bettina Gavia interpreted the EKG as normal sinus rhythm and does not have any further recommendations at this time. Patient will follow up with Dr. Bettina Gavia as scheduled on 09/12/2019 at 10:20 am in the [facility] office and call our office with any further questions or concerns before then. No further questions.

## 2019-09-02 NOTE — Telephone Encounter (Signed)
Patient is having [facility] send EKG strips through the fax so [facility] can see them.

## 2019-09-11 NOTE — Progress Notes (Signed)
Cardiology Office Note:    Date:  09/12/2019   ID:  Tina Dominguez, DOB 1941-08-08, MRN [id number]  PCP:  Bonnita Nasuti, MD  Cardiologist:  Shirlee More, MD    Referring MD: Bonnita Nasuti, MD    ASSESSMENT:    1. PAF (paroxysmal atrial fibrillation) ([place])   2. Chronic anticoagulation   3. Hypertensive heart disease with heart failure ([place])   4. Chronic diastolic heart failure ([place])   5. Hypothyroidism, unspecified type    PLAN:    In order of problems listed above:  1. Stable continue low-dose amiodarone beta-blocker anticoagulant I offered but at this time do not think she requires implanted loop recorder and we decided see her in 3 months in the office and the patient and her son will contact me if she had episodes of syncope or recurrence of rapid heart rhythm not managed with clonidine 2. Continue her current anticoagulant with hypercoagulable disorder 3. Nicely controlled with the addition of clonidine she will take an additional dose if needed for blood pressure greater than 180/90 and continue her current loop diuretic.  Potassium renal function performed last month normal 4. Stable TSH is normal perhaps she was excessively repleted contributing to her recent symptoms   Next appointment: 3 months   Medication Adjustments/Labs and Tests Ordered: Current medicines are reviewed at length with the patient today.  Concerns regarding medicines are outlined above.  No orders of the defined types were placed in this encounter.  No orders of the defined types were placed in this encounter.   Chief Complaint  Patient presents with  . Follow-up  . Atrial Fibrillation  . Bradycardia  . Anticoagulation    History of Present Illness:    Tina Dominguez is a 78 y.o. 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 With bradycardia amiodarone was  discontinued the blocker was changed to as needed.  Labs with concerns of recurrent atrial fibrillation amiodarone was resumed ending ZIO monitor showed no recurrence of atrial fibrillation or flutter..  A strip from EMS was obtained from 07/23/2019 showing marked baseline artifact but sinus rhythm with normal PR QRS intervals and rate 70 bpm. Compliance with diet, lifestyle and medications: Yes  Study Highlights A ZIO monitor was performed for 14 days beginning 07/17/2019 to assess for atrial fibrillation.  The rhythm throughout was sinus with minimum average and maximum heart rates of 31, 47 and 127 bpm. There were no pauses of 3 seconds or greater.  There is nocturnal sinus bradycardia with rates down to 31 bpm.  Associated with it was brief accelerated idioventricular rhythm at the same rate.  There are no episodes of second-degree third-degree AV block or sinus node exit block. Ventricular ectopy was rare with isolated PVCs Supraventricular ectopy was rare with isolated APCs and one 4 beat run of atrial premature contractions.  There were no episodes of atrial fibrillation or flutter. There were 9 triggered and 4 diary events all associated with sinus rhythm and sinus tachycardia.  Conclusion 1 absence of atrial fibrillation                  2 sinus bradycardia with brief accelerated idioventricular rhythm.   Her son wanted to come in with his mother during the visit with COVID-19 this does not fit our guidelines dose although I did call him  from the room before his mother left  Her last visit with addition of clonidine her blood pressure has been nicely controlled typically 140/80 or less at home and she has not had another paroxysm of hypertension and tachycardia.  What frightened by it I have given her reassurance I offered the patient and her son we could implant a loop recorder if they wanted to but advised this time continue her current medications and she can take clonidine in the future  if she has a paroxysm of hypertension.  She is reassured.  She tells me in the last few months she had a decrease in her thyroid medication.  She is having no edema chest pain palpitation or syncope compliant with her anticoagulant and no bleeding complication.  I reviewed the ZIO monitor with the patient and her son and the EKG strips sent by EMS.  Recent labs performed 08/06/2019 creatinine 1.04 potassium 4.7 TSH 2.7 Past Medical History:  Diagnosis Date  . Aftercare following surgery of the circulatory system, [place] 10/14/2013  . Anemia 12/13/2017  . Anxiety 12/13/2017  . Arthritis   . Carotid artery occlusion   . Clotting disorder ([place])   . Coronary artery disease   . Depression   . Dysphagia 04/17/2017  . GERD (gastroesophageal reflux disease) 05/26/2011  . GI bleeding 10/04/2017  . Gout 12/13/2017  . Hiatal hernia 12/13/2017  . 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   Marland Kitchen History of recurrent TIAs 12/13/2017  . History of right-sided carotid endarterectomy 10/10/2011  . Hyperlipidemia   . Hypertension   . Leg pain    with walking  . Occlusion and stenosis of carotid artery without mention of cerebral infarction 10/08/2012  . Osteoarthritis 05/26/2011  . Osteoporosis   . PE (pulmonary embolism) 05/26/2011  . 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[place]) 04/17/2017  . Reflux   . Renal failure 12/13/2017  . S/P IVC filter 12/13/2017  . Schatzki's ring 12/13/2017  . Stroke [facility])    X's 4  . Valvular heart disease 12/13/2017    Past Surgical History:  Procedure Laterality Date  . ABDOMINAL HYSTERECTOMY  1980   TAH   . APPENDECTOMY    . BACK SURGERY  06/2011  . BREAST SURGERY     x2  . CAROTID ENDARTERECTOMY  11/11/2010   Right CEA  . CORONARY ARTERY BYPASS GRAFT N/A 12/11/2018   Procedure: CORONARY ARTERY BYPASS GRAFTING (CABG) x three , using left internal mammary artery, and right leg greater saphenous  vein harvested endoscopically - SVG to OM1, SVG to Distal RCA;  Surgeon: Grace Isaac, MD;  Location: [place];  Service: Open Heart Surgery;  Laterality: N/A;  . IVC FILTER INSERTION    . JOINT REPLACEMENT Right 2005   Right Total Knee  . JOINT REPLACEMENT Left 2005   Left Total Knee  . LEFT HEART CATH AND CORONARY ANGIOGRAPHY N/A 12/10/2018   Procedure: LEFT HEART CATH AND CORONARY ANGIOGRAPHY;  Surgeon: Jettie Booze, MD;  Location: [place] CV LAB;  Service: Cardiovascular;  Laterality: N/A;  . SHOULDER SURGERY     right  . SPINE SURGERY  07-11-11   Decomp. Laminectomy, fusion of L4-5,L5-S1 by Dr. Saintclair Halsted  . TEE WITHOUT CARDIOVERSION N/A 12/11/2018   Procedure: TRANSESOPHAGEAL ECHOCARDIOGRAM (TEE);  Surgeon: Grace Isaac, MD;  Location: [place];  Service: Open Heart Surgery;  Laterality: N/A;  . TOTAL  KNEE ARTHROPLASTY     right  and left    Current Medications: Current Meds  Medication Sig  . acetaminophen (TYLENOL) 500 MG tablet Take 500-1,000 mg by mouth every 6 (six) hours as needed for moderate pain.   Marland Kitchen amiodarone (PACERONE) 200 MG tablet Take 1 tablet (200 mg total) by mouth daily.  Marland Kitchen amLODipine (NORVASC) 5 MG tablet Take 1 tablet (5 mg total) by mouth daily.  Marland Kitchen aspirin EC 81 MG EC tablet Take 1 tablet (81 mg total) by mouth daily.  Marland Kitchen atorvastatin (LIPITOR) 40 MG tablet TAKE 1 TABLET BY MOUTH EVERY DAY AT 6PM  . citalopram (CELEXA) 10 MG tablet Take 10 mg by mouth at bedtime. Take with 40 mg to equal 50 mg at bedtime  . citalopram (CELEXA) 40 MG tablet Take 40 mg by mouth daily. Take with 10 mg to equal 50 mg at bedtime  . cloNIDine (CATAPRES) 0.1 MG tablet Take 1 tablet (0.1 mg total) by mouth at bedtime.  . furosemide (LASIX) 20 MG tablet Take 1 tablet (20 mg total) by mouth every other day.  . levothyroxine (SYNTHROID) 100 MCG tablet Take 100 mcg by mouth daily.  Marland Kitchen linaclotide (LINZESS) 145 MCG CAPS capsule Take 145 mcg by mouth every other day.   .  metoprolol tartrate (LOPRESSOR) 25 MG tablet Metoprolol tartrate 12.5 mg (half tablet) in the morning daily. Hold for heart rate <100.  . mirtazapine (REMERON) 30 MG tablet Take 30 mg by mouth at bedtime.   . Multiple Vitamin (MULTIVITAMIN) tablet Take 1 tablet by mouth at bedtime.   . Oxycodone HCl 10 MG TABS Take 10 mg by mouth every 6 (six) hours as needed (pain).   . pantoprazole (PROTONIX) 40 MG tablet TAKE 1 TABLET(40 MG) BY MOUTH TWICE DAILY  . potassium chloride SA (K-DUR) 20 MEQ tablet Take 1 Tab (60meq) every other day with lasix  . XARELTO 20 MG TABS tablet Take 20 mg by mouth daily.     Allergies:   Patient has no known allergies.   Social History   Socioeconomic History  . Marital status: Widowed    Spouse name: Not on file  . Number of children: Not on file  . Years of education: Not on file  . Highest education level: Not on file  Occupational History  . Not on file  Tobacco Use  . Smoking status: Never Smoker  . Smokeless tobacco: Never Used  Substance and Sexual Activity  . Alcohol use: No  . Drug use: No  . Sexual activity: Not on file  Other Topics Concern  . Not on file  Social History Narrative  . Not on file   Social Determinants of Health   Financial Resource Strain:   . Difficulty of Paying Living Expenses: Not on file  Food Insecurity:   . Worried About Charity fundraiser in the Last Year: Not on file  . Ran Out of Food in the Last Year: Not on file  Transportation Needs:   . Lack of Transportation (Medical): Not on file  . Lack of Transportation (Non-Medical): Not on file  Physical Activity:   . Days of Exercise per Week: Not on file  . Minutes of Exercise per Session: Not on file  Stress:   . Feeling of Stress : Not on file  Social Connections:   . Frequency of Communication with Friends and Family: Not on file  . Frequency of Social Gatherings with Friends and Family: Not on file  .  Attends Religious Services: Not on file  . Active  Member of Clubs or Organizations: Not on file  . Attends Archivist Meetings: Not on file  . Marital Status: Not on file     Family History: The patient's family history includes Clotting disorder in her mother; Deep vein thrombosis in her mother and sister; Diabetes in her mother and sister; Heart disease in her brother and mother; Heart disease (age of onset: 75) in her father; Hyperlipidemia in her mother; Hypertension in her mother; Stroke in her maternal grandmother; Varicose Veins in her father. ROS:   Please see the history of present illness.    All other systems reviewed and are negative.  EKGs/Labs/Other Studies Reviewed:    The following studies were reviewed today:  EKG:  EKG ordered today and personally reviewed.  The ekg ordered today demonstrates sinus rhythm 54 bpm first-degree AV block  Recent Labs: 12/14/2018: Magnesium 1.9 01/29/2019: Hemoglobin 11.3; NT-Pro BNP 1,163; Platelets 419 05/03/2019: ALT 9 08/06/2019: BUN 23; Creatinine, Ser 1.04; Potassium 4.7; Sodium 144; TSH 2.710  Recent Lipid Panel    Component Value Date/Time   CHOL 104 12/18/2018 0246   TRIG 123 12/18/2018 0246   HDL 35 (L) 12/18/2018 0246   CHOLHDL 3.0 12/18/2018 0246   VLDL 25 12/18/2018 0246   LDLCALC 44 12/18/2018 0246    Physical Exam:    VS:  BP (!) 142/74 (BP Location: Right Arm, Patient Position: Sitting, Cuff Size: Normal)   Pulse (!) 54   Ht 5\' 3"  (1.6 m)   Wt 160 lb (72.6 kg)   SpO2 99%   BMI 28.34 kg/m     Wt Readings from Last 3 Encounters:  09/12/19 160 lb (72.6 kg)  08/06/19 160 lb 12 oz (72.9 kg)  07/17/19 158 lb 12.8 oz (72 kg)     GEN: Anxious a little tremulous well nourished, well developed in no acute distress HEENT: Normal NECK: No JVD; No carotid bruits LYMPHATICS: No lymphadenopathy CARDIAC: RRR, no murmurs, rubs, gallops RESPIRATORY:  Clear to auscultation without rales, wheezing or rhonchi  ABDOMEN: Soft, non-tender,  non-distended MUSCULOSKELETAL:  No edema; No deformity  SKIN: Warm and dry NEUROLOGIC:  Alert and oriented x 3 PSYCHIATRIC:  Normal affect    Signed, Shirlee More, MD  09/12/2019 10:53 AM    [place]

## 2019-09-12 ENCOUNTER — Other Ambulatory Visit: Payer: Self-pay

## 2019-09-12 ENCOUNTER — Ambulatory Visit (INDEPENDENT_AMBULATORY_CARE_PROVIDER_SITE_OTHER): Payer: PPO | Admitting: Cardiology

## 2019-09-12 ENCOUNTER — Encounter: Payer: Self-pay | Admitting: Cardiology

## 2019-09-12 VITALS — BP 142/74 | HR 54 | Ht 63.0 in | Wt 160.0 lb

## 2019-09-12 DIAGNOSIS — I480 Paroxysmal atrial fibrillation: Secondary | ICD-10-CM | POA: Diagnosis not present

## 2019-09-12 DIAGNOSIS — I110 Hypertensive heart disease with heart failure: Secondary | ICD-10-CM

## 2019-09-12 DIAGNOSIS — I5032 Chronic diastolic (congestive) heart failure: Secondary | ICD-10-CM

## 2019-09-12 DIAGNOSIS — E039 Hypothyroidism, unspecified: Secondary | ICD-10-CM | POA: Diagnosis not present

## 2019-09-12 DIAGNOSIS — Z7901 Long term (current) use of anticoagulants: Secondary | ICD-10-CM

## 2019-09-12 MED ORDER — CLONIDINE HCL 0.1 MG PO TABS: 0.1000 mg | ORAL_TABLET | Freq: Every day | ORAL | 2 refills | Status: DC

## 2019-09-12 NOTE — Patient Instructions (Signed)
Medication Instructions:  Your physician has recommended you make the following change in your medication:   INCREASE clonidine (catapres) 0.1 mg: Take 1 tablet daily at bedtime. Take 1 extra tablet daily if your systolic BP (top number) is greater than 180 and if your heart rate is greater than 100.  *If you need a refill on your cardiac medications before your next appointment, please call your pharmacy*  Lab Work: None  If you have labs (blood work) drawn today and your tests are completely normal, you will receive your results only by: Marland Kitchen MyChart Message (if you have MyChart) OR . A paper copy in the mail If you have any lab test that is abnormal or we need to change your treatment, we will call you to review the results.  Testing/Procedures: You had an EKG today.   Follow-Up: At [facility], you and your health needs are our priority.  As part of our continuing mission to provide you with exceptional heart care, we have created designated Provider Care Teams.  These Care Teams include your primary Cardiologist (physician) and Advanced Practice Providers (APPs -  Physician Assistants and Nurse Practitioners) who all work together to provide you with the care you need, when you need it.  Your next appointment:   3 month(s)  The format for your next appointment:   In Person  Provider:   Shirlee More, MD

## 2019-09-13 ENCOUNTER — Other Ambulatory Visit: Payer: Self-pay | Admitting: *Deleted

## 2019-09-13 DIAGNOSIS — E038 Other specified hypothyroidism: Secondary | ICD-10-CM | POA: Diagnosis not present

## 2019-09-13 DIAGNOSIS — Z5181 Encounter for therapeutic drug level monitoring: Secondary | ICD-10-CM | POA: Diagnosis not present

## 2019-09-13 DIAGNOSIS — I10 Essential (primary) hypertension: Secondary | ICD-10-CM | POA: Diagnosis not present

## 2019-09-13 DIAGNOSIS — I480 Paroxysmal atrial fibrillation: Secondary | ICD-10-CM

## 2019-09-13 DIAGNOSIS — M158 Other polyosteoarthritis: Secondary | ICD-10-CM | POA: Diagnosis not present

## 2019-09-13 DIAGNOSIS — E782 Mixed hyperlipidemia: Secondary | ICD-10-CM | POA: Diagnosis not present

## 2019-09-13 MED ORDER — METOPROLOL TARTRATE 25 MG PO TABS: ORAL_TABLET | ORAL | 2 refills | Status: AC

## 2019-10-11 DIAGNOSIS — E782 Mixed hyperlipidemia: Secondary | ICD-10-CM | POA: Diagnosis not present

## 2019-10-11 DIAGNOSIS — E1165 Type 2 diabetes mellitus with hyperglycemia: Secondary | ICD-10-CM | POA: Diagnosis not present

## 2019-10-11 DIAGNOSIS — Z5181 Encounter for therapeutic drug level monitoring: Secondary | ICD-10-CM | POA: Diagnosis not present

## 2019-10-11 DIAGNOSIS — D518 Other vitamin B12 deficiency anemias: Secondary | ICD-10-CM | POA: Diagnosis not present

## 2019-10-11 DIAGNOSIS — E038 Other specified hypothyroidism: Secondary | ICD-10-CM | POA: Diagnosis not present

## 2019-10-11 DIAGNOSIS — E559 Vitamin D deficiency, unspecified: Secondary | ICD-10-CM | POA: Diagnosis not present

## 2019-10-11 DIAGNOSIS — I10 Essential (primary) hypertension: Secondary | ICD-10-CM | POA: Diagnosis not present

## 2019-10-11 DIAGNOSIS — Z79899 Other long term (current) drug therapy: Secondary | ICD-10-CM | POA: Diagnosis not present

## 2019-10-11 DIAGNOSIS — Z86718 Personal history of other venous thrombosis and embolism: Secondary | ICD-10-CM | POA: Diagnosis not present

## 2019-11-08 DIAGNOSIS — E782 Mixed hyperlipidemia: Secondary | ICD-10-CM | POA: Diagnosis not present

## 2019-11-08 DIAGNOSIS — E038 Other specified hypothyroidism: Secondary | ICD-10-CM | POA: Diagnosis not present

## 2019-11-08 DIAGNOSIS — I10 Essential (primary) hypertension: Secondary | ICD-10-CM | POA: Diagnosis not present

## 2019-11-08 DIAGNOSIS — Z5181 Encounter for therapeutic drug level monitoring: Secondary | ICD-10-CM | POA: Diagnosis not present

## 2019-11-08 DIAGNOSIS — D6851 Activated protein C resistance: Secondary | ICD-10-CM | POA: Diagnosis not present

## 2019-12-06 DIAGNOSIS — I10 Essential (primary) hypertension: Secondary | ICD-10-CM | POA: Diagnosis not present

## 2019-12-06 DIAGNOSIS — D6851 Activated protein C resistance: Secondary | ICD-10-CM | POA: Diagnosis not present

## 2019-12-06 DIAGNOSIS — E782 Mixed hyperlipidemia: Secondary | ICD-10-CM | POA: Diagnosis not present

## 2019-12-06 DIAGNOSIS — Z5181 Encounter for therapeutic drug level monitoring: Secondary | ICD-10-CM | POA: Diagnosis not present

## 2019-12-06 DIAGNOSIS — Z7901 Long term (current) use of anticoagulants: Secondary | ICD-10-CM | POA: Diagnosis not present

## 2019-12-13 ENCOUNTER — Encounter: Payer: Self-pay | Admitting: Cardiology

## 2019-12-13 ENCOUNTER — Other Ambulatory Visit: Payer: Self-pay

## 2019-12-13 ENCOUNTER — Ambulatory Visit: Payer: PPO | Admitting: Cardiology

## 2019-12-13 VITALS — BP 142/86 | HR 56 | Temp 97.8°F | Ht 63.0 in | Wt 172.2 lb

## 2019-12-13 DIAGNOSIS — Z79899 Other long term (current) drug therapy: Secondary | ICD-10-CM | POA: Diagnosis not present

## 2019-12-13 DIAGNOSIS — I480 Paroxysmal atrial fibrillation: Secondary | ICD-10-CM

## 2019-12-13 DIAGNOSIS — E782 Mixed hyperlipidemia: Secondary | ICD-10-CM | POA: Diagnosis not present

## 2019-12-13 DIAGNOSIS — R946 Abnormal results of thyroid function studies: Secondary | ICD-10-CM | POA: Diagnosis not present

## 2019-12-13 DIAGNOSIS — I495 Sick sinus syndrome: Secondary | ICD-10-CM

## 2019-12-13 DIAGNOSIS — I25118 Atherosclerotic heart disease of native coronary artery with other forms of angina pectoris: Secondary | ICD-10-CM

## 2019-12-13 DIAGNOSIS — I110 Hypertensive heart disease with heart failure: Secondary | ICD-10-CM | POA: Diagnosis not present

## 2019-12-13 DIAGNOSIS — Z7901 Long term (current) use of anticoagulants: Secondary | ICD-10-CM | POA: Diagnosis not present

## 2019-12-13 MED ORDER — FUROSEMIDE 20 MG PO TABS: ORAL_TABLET | ORAL | 3 refills | Status: DC

## 2019-12-13 NOTE — Patient Instructions (Signed)
Medication Instructions:  Your physician has recommended you make the following change in your medication:  1.  INCREASE the Lasix to twice a day for 2 days only then go back to taking 1 daily  *If you need a refill on your cardiac medications before your next appointment, please call your pharmacy*   Lab Work: TODAY:  CMP, TSH, LIPID, & CBC  If you have labs (blood work) drawn today and your tests are completely normal, you will receive your results only by: Marland Kitchen MyChart Message (if you have MyChart) OR . A paper copy in the mail If you have any lab test that is abnormal or we need to change your treatment, we will call you to review the results.   Testing/Procedures: None ordered   Follow-Up: At [facility], you and your health needs are our priority.  As part of our continuing mission to provide you with exceptional heart care, we have created designated Provider Care Teams.  These Care Teams include your primary Cardiologist (physician) and Advanced Practice Providers (APPs -  Physician Assistants and Nurse Practitioners) who all work together to provide you with the care you need, when you need it.  We recommend signing up for the patient portal called "MyChart".  Sign up information is provided on this After Visit Summary.  MyChart is used to connect with patients for Virtual Visits (Telemedicine).  Patients are able to view lab/test results, encounter notes, upcoming appointments, etc.  Non-urgent messages can be sent to your provider as well.   To learn more about what you can do with MyChart, go to [email or web address].    Your next appointment:   6 month(s)  The format for your next appointment:   In Person  Provider:   Shirlee More, MD   Other Instructions **

## 2019-12-13 NOTE — Progress Notes (Signed)
Cardiology Office Note:    Date:  12/13/2019   ID:  Tina Dominguez, DOB May 01, 1941, MRN [id number]  PCP:  Bonnita Nasuti, MD  Cardiologist:  Shirlee More, MD    Referring MD: Bonnita Nasuti, MD    ASSESSMENT:    1. PAF (paroxysmal atrial fibrillation) ([place])   2. On amiodarone therapy   3. Chronic anticoagulation   4. SSS (sick sinus syndrome) ([place])   5. Hypertensive heart disease with heart failure ([place])   6. Coronary artery disease of native artery of native heart with stable angina pectoris ([place])   7. Mixed hyperlipidemia    PLAN:    In order of problems listed above:  1. Overall improved maintaining sinus rhythm without symptomatic bradycardia off beta-blocker and will continue low-dose amiodarone anticoagulant.  Will screen for toxicity including liver function thyroid and check a CBC with a combined aspirin and anti-coagulant therapy.  Sick sinus syndrome is improved and she has no symptomatic bradycardia 2. Hypertension stable BP at target continue current multidrug regimen including diuretic centrally active clonidine and amlodipine. 3. Heart failure is mildly decompensated edematous symptomatic increase diuretic check renal function potassium proBNP level 4. Stable CAD having no anginal discomfort continue medical therapy including lipid-lowering high intensity statin check lipid profile goal LDL less than 70   Next appointment: 6 months   Medication Adjustments/Labs and Tests Ordered: Current medicines are reviewed at length with the patient today.  Concerns regarding medicines are outlined above.  No orders of the defined types were placed in this encounter.  No orders of the defined types were placed in this encounter.   Chief Complaint  Patient presents with  . Follow-up  . Atrial Fibrillation  . Bradycardia    History of Present Illness:    Tina Dominguez is a 79 y.o. 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  She has had mild bradycardia and underwent a Holter monitor most recently in October 2020. She was last seen 09/12/2019. Compliance with diet, lifestyle and medications: Yes  Overall she is doing well she does notice that she has more peripheral edema and she is a little bit breathless with activities.  DM she has edema we will increase her diuretic and check labs including a CBC with anticoagulation and a proBNP level.  She has had no clinical bleeding.  She has had no chest pain palpitation or syncope and feels much better off beta-blocker and on low-dose amiodarone. Past Medical History:  Diagnosis Date  . Aftercare following surgery of the circulatory system, [place] 10/14/2013  . Anemia 12/13/2017  . Anxiety 12/13/2017  . Arthritis   . CAD (coronary artery disease) 12/10/2018  . 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HCC) 12/18/2017  . Carotid artery occlusion   . Carotid stenosis, bilateral 05/26/2011  . CKD (chronic kidney disease) stage 3, GFR 30-59 ml/min 12/13/2017  . Clotting disorder ([place])   . Coronary artery disease   . Depression   . Dysphagia 04/17/2017  . Factor V Leiden mutation ([place]) 12/18/2017  . GERD (gastroesophageal reflux disease) 05/26/2011  . GI bleeding 10/04/2017  . Gout 12/13/2017  . Hiatal hernia 12/13/2017  . 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   Marland Kitchen  History of recurrent TIAs 12/13/2017  . History of right-sided carotid endarterectomy 10/10/2011  . Hyperlipidemia   . Hypertension   . Hypertensive heart disease with heart failure ([place]) 01/29/2019  . Leg pain    with walking  . Occlusion and stenosis of carotid artery without mention of cerebral infarction 10/08/2012  . Osteoarthritis 05/26/2011  . Osteoporosis   . PAT (paroxysmal atrial  tachycardia) ([place]) 12/05/2018  . PE (pulmonary embolism) 05/26/2011  . 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[place]) 04/17/2017  . Reflux   . Renal failure 12/13/2017  . S/P IVC filter 12/13/2017  . Schatzki's ring 12/13/2017  . Stroke [facility])    X's 4  . Valvular heart disease 12/13/2017    Past Surgical History:  Procedure Laterality Date  . ABDOMINAL HYSTERECTOMY  1980   TAH   . APPENDECTOMY    . BACK SURGERY  06/2011  . BREAST SURGERY     x2  . CAROTID ENDARTERECTOMY  11/11/2010   Right CEA  . CORONARY ARTERY BYPASS GRAFT N/A 12/11/2018   Procedure: CORONARY ARTERY BYPASS GRAFTING (CABG) x three , using left internal mammary artery, and right leg greater saphenous vein harvested endoscopically - SVG to OM1, SVG to Distal RCA;  Surgeon: Grace Isaac, MD;  Location: [place];  Service: Open Heart Surgery;  Laterality: N/A;  . IVC FILTER INSERTION    . JOINT REPLACEMENT Right 2005   Right Total Knee  . JOINT REPLACEMENT Left 2005   Left Total Knee  . LEFT HEART CATH AND CORONARY ANGIOGRAPHY N/A 12/10/2018   Procedure: LEFT HEART CATH AND CORONARY ANGIOGRAPHY;  Surgeon: Jettie Booze, MD;  Location: [place] [facility];  Service: Cardiovascular;  Laterality: N/A;  . SHOULDER SURGERY     right  . SPINE SURGERY  07-11-11   Decomp. Laminectomy, fusion of L4-5,L5-S1 by Dr. Saintclair Halsted  . TEE WITHOUT CARDIOVERSION N/A 12/11/2018   Procedure: TRANSESOPHAGEAL ECHOCARDIOGRAM (TEE);  Surgeon: Grace Isaac, MD;  Location: [place];  Service: Open Heart Surgery;  Laterality: N/A;  . TOTAL KNEE ARTHROPLASTY     right  and left    Current Medications: Current Meds  Medication Sig  . acetaminophen (TYLENOL) 500 MG tablet Take 500-1,000 mg by mouth every 6 (six) hours as needed for moderate pain.   Marland Kitchen amiodarone (PACERONE) 200 MG tablet Take 1 tablet (200 mg total) by mouth daily.  Marland Kitchen amLODipine (NORVASC) 5 MG tablet Take 1 tablet (5 mg total) by mouth daily.  Marland Kitchen aspirin EC 81 MG EC tablet Take 1 tablet (81  mg total) by mouth daily.  Marland Kitchen atorvastatin (LIPITOR) 40 MG tablet TAKE 1 TABLET BY MOUTH EVERY DAY AT 6PM  . citalopram (CELEXA) 10 MG tablet Take 10 mg by mouth at bedtime. Take with 40 mg to equal 50 mg at bedtime  . citalopram (CELEXA) 40 MG tablet Take 40 mg by mouth daily. Take with 10 mg to equal 50 mg at bedtime  . cloNIDine (CATAPRES) 0.1 MG tablet Take 1 tablet (0.1 mg total) by mouth at bedtime. Take 1 extra tablet daily if your systolic BP (top number) is greater than 180 and if your heart rate is greater than 100.  . furosemide (LASIX) 20 MG tablet Take 1 tablet (20 mg total) by mouth every other day.  . levothyroxine (SYNTHROID) 100 MCG tablet Take 100 mcg by mouth daily.  Marland Kitchen linaclotide (LINZESS) 145 MCG CAPS capsule Take 145 mcg by mouth every other day.   . metoprolol tartrate (  LOPRESSOR) 25 MG tablet Metoprolol tartrate 12.5 mg (half tablet) in the morning daily. Hold for heart rate <100.  . mirtazapine (REMERON) 30 MG tablet Take 30 mg by mouth at bedtime.   . Multiple Vitamin (MULTIVITAMIN) tablet Take 1 tablet by mouth at bedtime.   . Oxycodone HCl 10 MG TABS Take 10 mg by mouth every 6 (six) hours as needed (pain).   . pantoprazole (PROTONIX) 40 MG tablet TAKE 1 TABLET(40 MG) BY MOUTH TWICE DAILY  . potassium chloride SA (K-DUR) 20 MEQ tablet Take 1 Tab (61meq) every other day with lasix  . XARELTO 20 MG TABS tablet Take 20 mg by mouth daily.     Allergies:   Patient has no known allergies.   Social History   Socioeconomic History  . Marital status: Widowed    Spouse name: Not on file  . Number of children: Not on file  . Years of education: Not on file  . Highest education level: Not on file  Occupational History  . Not on file  Tobacco Use  . Smoking status: Never Smoker  . Smokeless tobacco: Never Used  Substance and Sexual Activity  . Alcohol use: No  . Drug use: No  . Sexual activity: Not on file  Other Topics Concern  . Not on file  Social History  Narrative  . Not on file   Social Determinants of Health   Financial Resource Strain:   . Difficulty of Paying Living Expenses:   Food Insecurity:   . Worried About Charity fundraiser in the Last Year:   . Arboriculturist in the Last Year:   Transportation Needs:   . Film/video editor (Medical):   Marland Kitchen Lack of Transportation (Non-Medical):   Physical Activity:   . Days of Exercise per Week:   . Minutes of Exercise per Session:   Stress:   . Feeling of Stress :   Social Connections:   . Frequency of Communication with Friends and Family:   . Frequency of Social Gatherings with Friends and Family:   . Attends Religious Services:   . Active Member of Clubs or Organizations:   . Attends Archivist Meetings:   Marland Kitchen Marital Status:      Family History: The patient's family history includes Clotting disorder in her mother; Deep vein thrombosis in her mother and sister; Diabetes in her mother and sister; Heart disease in her brother and mother; Heart disease (age of onset: 46) in her father; Hyperlipidemia in her mother; Hypertension in her mother; Stroke in her maternal grandmother; Varicose Veins in her father. ROS:   Please see the history of present illness.    All other systems reviewed and are negative.  EKGs/Labs/Other Studies Reviewed:    The following studies were reviewed today:  EKG:  EKG ordered today and personally reviewed.  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 normal QT interval  Recent Labs: 12/14/2018: Magnesium 1.9 01/29/2019: Hemoglobin 11.3; NT-Pro BNP 1,163; Platelets 419 05/03/2019: ALT 9 08/06/2019: BUN 23; Creatinine, Ser 1.04; Potassium 4.7; Sodium 144; TSH 2.710  Recent Lipid Panel    Component Value Date/Time   CHOL 104 12/18/2018 0246   TRIG 123 12/18/2018 0246   HDL 35 (L) 12/18/2018 0246   CHOLHDL 3.0 12/18/2018 0246   VLDL 25 12/18/2018 0246   LDLCALC 44 12/18/2018 0246    Physical Exam:      VS:  BP (!) 142/86  Pulse (!) 56   Temp 97.8 F (36.6 C)   Ht 5\' 3"  (1.6 m)   Wt 172 lb 3.2 oz (78.1 kg)   SpO2 97%   BMI 30.50 kg/m     Wt Readings from Last 3 Encounters:  12/13/19 172 lb 3.2 oz (78.1 kg)  09/12/19 160 lb (72.6 kg)  08/06/19 160 lb 12 oz (72.9 kg)     GEN:  Well nourished, well developed in no acute distress HEENT: Normal NECK: Mild JVD; No carotid bruits LYMPHATICS: No lymphadenopathy CARDIAC: RRR, no murmurs, rubs, gallops RESPIRATORY:  Clear to auscultation without rales, wheezing or rhonchi  ABDOMEN: Soft, non-tender, non-distended MUSCULOSKELETAL: 2+ lower extremity pitting edema this is not typical of calcium channel blocker edema; No deformity  SKIN: Warm and dry NEUROLOGIC:  Alert and oriented x 3 PSYCHIATRIC:  Normal affect    Signed, Shirlee More, MD  12/13/2019 1:39 PM    [facility]

## 2019-12-14 LAB — COMPREHENSIVE METABOLIC PANEL
ALT: 9 IU/L (ref 0–32)
AST: 23 IU/L (ref 0–40)
Albumin/Globulin Ratio: 1.8 (ref 1.2–2.2)
Albumin: 4.4 g/dL (ref 3.7–4.7)
Alkaline Phosphatase: 106 IU/L (ref 39–117)
BUN/Creatinine Ratio: 22 (ref 12–28)
BUN: 24 mg/dL (ref 8–27)
Bilirubin Total: 0.2 mg/dL (ref 0.0–1.2)
CO2: 26 mmol/L (ref 20–29)
Calcium: 9.2 mg/dL (ref 8.7–10.3)
Chloride: 103 mmol/L (ref 96–106)
Creatinine, Ser: 1.09 mg/dL — ABNORMAL HIGH (ref 0.57–1.00)
GFR calc Af Amer: 57 mL/min/{1.73_m2} — ABNORMAL LOW (ref >59)
GFR calc non Af Amer: 49 mL/min/{1.73_m2} — ABNORMAL LOW (ref >59)
Globulin, Total: 2.5 g/dL (ref 1.5–4.5)
Glucose: 96 mg/dL (ref 65–99)
Potassium: 4.2 mmol/L (ref 3.5–5.2)
Sodium: 143 mmol/L (ref 134–144)
Total Protein: 6.9 g/dL (ref 6.0–8.5)

## 2019-12-14 LAB — LIPID PANEL
Chol/HDL Ratio: 2.3 ratio (ref 0.0–4.4)
Cholesterol, Total: 159 mg/dL (ref 100–199)
HDL: 69 mg/dL (ref >39)
LDL Chol Calc (NIH): 62 mg/dL (ref 0–99)
Triglycerides: 172 mg/dL — ABNORMAL HIGH (ref 0–149)
VLDL Cholesterol Cal: 28 mg/dL (ref 5–40)

## 2019-12-14 LAB — CBC
Hematocrit: 38.8 % (ref 34.0–46.6)
Hemoglobin: 12.9 g/dL (ref 11.1–15.9)
MCH: 31.5 pg (ref 26.6–33.0)
MCHC: 33.2 g/dL (ref 31.5–35.7)
MCV: 95 fL (ref 79–97)
Platelets: 382 10*3/uL (ref 150–450)
RBC: 4.1 x10E6/uL (ref 3.77–5.28)
RDW: 12.7 % (ref 11.7–15.4)
WBC: 8.1 10*3/uL (ref 3.4–10.8)

## 2019-12-14 LAB — TSH: TSH: 7.21 u[IU]/mL — ABNORMAL HIGH (ref 0.450–4.500)

## 2019-12-16 NOTE — Addendum Note (Signed)
Addended by: Truddie Hidden on: 12/16/2019 10:00 AM   Modules accepted: Orders

## 2019-12-23 ENCOUNTER — Telehealth: Payer: Self-pay | Admitting: Cardiology

## 2019-12-23 MED ORDER — ATORVASTATIN CALCIUM 40 MG PO TABS: ORAL_TABLET | ORAL | 3 refills | Status: DC

## 2019-12-23 MED ORDER — CLONIDINE HCL 0.1 MG PO TABS: 0.1000 mg | ORAL_TABLET | Freq: Every day | ORAL | 3 refills | Status: DC

## 2019-12-23 NOTE — Telephone Encounter (Signed)
*  STAT* If patient is at the pharmacy, call can be transferred to refill team.   1. Which medications need to be refilled? (please list name of each medication and dose if known)  atorvastatin (LIPITOR) 40 MG tablet cloNIDine (CATAPRES) 0.1 MG tablet  2. Which pharmacy/location (including street and city if local pharmacy) is medication to be sent to?  [facility] [phone] - [place], [address] AT [address]  3. Do they need a 30 day or 90 day supply? [place]

## 2019-12-23 NOTE — Telephone Encounter (Signed)
Rx sent for Atorvastatin and Clonidine to [facility] in [place].

## 2019-12-26 ENCOUNTER — Other Ambulatory Visit: Payer: Self-pay | Admitting: Cardiology

## 2019-12-26 NOTE — Telephone Encounter (Signed)
New Message   *STAT* If patient is at the pharmacy, call can be transferred to refill team.   1. Which medications need to be refilled? (please list name of each medication and dose if known)  cloNIDine (CATAPRES) 0.1 MG tablet  2. Which pharmacy/location (including street and city if local pharmacy) is medication to be sent to? [facility] [phone] - [place], [address] AT [place]  3. Do they need a 30 day or 90 day supply? 90 day supply

## 2019-12-27 MED ORDER — CLONIDINE HCL 0.1 MG PO TABS: 0.1000 mg | ORAL_TABLET | Freq: Every day | ORAL | 3 refills | Status: DC

## 2019-12-31 ENCOUNTER — Other Ambulatory Visit: Payer: Self-pay

## 2020-01-01 MED ORDER — CLONIDINE HCL 0.1 MG PO TABS: 0.1000 mg | ORAL_TABLET | Freq: Every day | ORAL | 3 refills | Status: AC

## 2020-01-01 NOTE — Telephone Encounter (Signed)
Pt aware Clonidine Rx re-sent.

## 2020-01-01 NOTE — Addendum Note (Signed)
Addended by: Stanton Kidney on: 01/01/2020 08:30 AM   Modules accepted: Orders

## 2020-01-01 NOTE — Telephone Encounter (Signed)
Follow Up  Patient states that pharmacy never received the prescription for the Clonidine. Please assist.

## 2020-01-06 DIAGNOSIS — E1165 Type 2 diabetes mellitus with hyperglycemia: Secondary | ICD-10-CM | POA: Diagnosis not present

## 2020-01-06 DIAGNOSIS — E782 Mixed hyperlipidemia: Secondary | ICD-10-CM | POA: Diagnosis not present

## 2020-01-06 DIAGNOSIS — Z79899 Other long term (current) drug therapy: Secondary | ICD-10-CM | POA: Diagnosis not present

## 2020-01-06 DIAGNOSIS — I10 Essential (primary) hypertension: Secondary | ICD-10-CM | POA: Diagnosis not present

## 2020-01-06 DIAGNOSIS — Z5181 Encounter for therapeutic drug level monitoring: Secondary | ICD-10-CM | POA: Diagnosis not present

## 2020-01-06 DIAGNOSIS — D518 Other vitamin B12 deficiency anemias: Secondary | ICD-10-CM | POA: Diagnosis not present

## 2020-01-06 DIAGNOSIS — Z86718 Personal history of other venous thrombosis and embolism: Secondary | ICD-10-CM | POA: Diagnosis not present

## 2020-01-06 DIAGNOSIS — E559 Vitamin D deficiency, unspecified: Secondary | ICD-10-CM | POA: Diagnosis not present

## 2020-01-06 DIAGNOSIS — E038 Other specified hypothyroidism: Secondary | ICD-10-CM | POA: Diagnosis not present

## 2020-01-07 DIAGNOSIS — R0989 Other specified symptoms and signs involving the circulatory and respiratory systems: Secondary | ICD-10-CM | POA: Diagnosis not present

## 2020-01-07 DIAGNOSIS — I6523 Occlusion and stenosis of bilateral carotid arteries: Secondary | ICD-10-CM | POA: Diagnosis not present

## 2020-01-09 ENCOUNTER — Other Ambulatory Visit: Payer: Self-pay | Admitting: *Deleted

## 2020-01-09 MED ORDER — AMIODARONE HCL 200 MG PO TABS: 200.0000 mg | ORAL_TABLET | Freq: Every day | ORAL | 1 refills | Status: DC

## 2020-01-09 NOTE — Telephone Encounter (Signed)
Received faxed request for refill on Amiodarone 200 mg. Rx sent to [place] [facility]90 and 1 Refill.

## 2020-01-24 DIAGNOSIS — E782 Mixed hyperlipidemia: Secondary | ICD-10-CM | POA: Diagnosis not present

## 2020-01-24 DIAGNOSIS — I10 Essential (primary) hypertension: Secondary | ICD-10-CM | POA: Diagnosis not present

## 2020-01-24 DIAGNOSIS — M158 Other polyosteoarthritis: Secondary | ICD-10-CM | POA: Diagnosis not present

## 2020-01-24 DIAGNOSIS — E038 Other specified hypothyroidism: Secondary | ICD-10-CM | POA: Diagnosis not present

## 2020-02-03 DIAGNOSIS — E038 Other specified hypothyroidism: Secondary | ICD-10-CM | POA: Diagnosis not present

## 2020-02-03 DIAGNOSIS — E782 Mixed hyperlipidemia: Secondary | ICD-10-CM | POA: Diagnosis not present

## 2020-02-03 DIAGNOSIS — Z86718 Personal history of other venous thrombosis and embolism: Secondary | ICD-10-CM | POA: Diagnosis not present

## 2020-02-03 DIAGNOSIS — I10 Essential (primary) hypertension: Secondary | ICD-10-CM | POA: Diagnosis not present

## 2020-02-03 DIAGNOSIS — Z5181 Encounter for therapeutic drug level monitoring: Secondary | ICD-10-CM | POA: Diagnosis not present

## 2020-02-24 DIAGNOSIS — M158 Other polyosteoarthritis: Secondary | ICD-10-CM | POA: Diagnosis not present

## 2020-02-24 DIAGNOSIS — E038 Other specified hypothyroidism: Secondary | ICD-10-CM | POA: Diagnosis not present

## 2020-02-24 DIAGNOSIS — I10 Essential (primary) hypertension: Secondary | ICD-10-CM | POA: Diagnosis not present

## 2020-02-24 DIAGNOSIS — E782 Mixed hyperlipidemia: Secondary | ICD-10-CM | POA: Diagnosis not present

## 2020-03-02 DIAGNOSIS — R079 Chest pain, unspecified: Secondary | ICD-10-CM | POA: Diagnosis not present

## 2020-03-02 DIAGNOSIS — K5900 Constipation, unspecified: Secondary | ICD-10-CM | POA: Diagnosis not present

## 2020-03-02 DIAGNOSIS — Z5181 Encounter for therapeutic drug level monitoring: Secondary | ICD-10-CM | POA: Diagnosis not present

## 2020-03-02 DIAGNOSIS — I25110 Atherosclerotic heart disease of native coronary artery with unstable angina pectoris: Secondary | ICD-10-CM | POA: Diagnosis not present

## 2020-03-02 DIAGNOSIS — N1831 Chronic kidney disease, stage 3a: Secondary | ICD-10-CM | POA: Diagnosis not present

## 2020-03-02 DIAGNOSIS — J441 Chronic obstructive pulmonary disease with (acute) exacerbation: Secondary | ICD-10-CM | POA: Diagnosis not present

## 2020-03-02 DIAGNOSIS — M064 Inflammatory polyarthropathy: Secondary | ICD-10-CM | POA: Diagnosis not present

## 2020-03-02 DIAGNOSIS — F330 Major depressive disorder, recurrent, mild: Secondary | ICD-10-CM | POA: Diagnosis not present

## 2020-03-02 DIAGNOSIS — E1122 Type 2 diabetes mellitus with diabetic chronic kidney disease: Secondary | ICD-10-CM | POA: Diagnosis not present

## 2020-03-02 DIAGNOSIS — M170 Bilateral primary osteoarthritis of knee: Secondary | ICD-10-CM | POA: Diagnosis not present

## 2020-03-02 DIAGNOSIS — Z0000 Encounter for general adult medical examination without abnormal findings: Secondary | ICD-10-CM | POA: Diagnosis not present

## 2020-03-02 DIAGNOSIS — I739 Peripheral vascular disease, unspecified: Secondary | ICD-10-CM | POA: Diagnosis not present

## 2020-03-19 DIAGNOSIS — M25561 Pain in right knee: Secondary | ICD-10-CM | POA: Diagnosis not present

## 2020-03-19 DIAGNOSIS — M25469 Effusion, unspecified knee: Secondary | ICD-10-CM | POA: Diagnosis not present

## 2020-04-07 DIAGNOSIS — I10 Essential (primary) hypertension: Secondary | ICD-10-CM | POA: Diagnosis not present

## 2020-04-07 DIAGNOSIS — E782 Mixed hyperlipidemia: Secondary | ICD-10-CM | POA: Diagnosis not present

## 2020-04-07 DIAGNOSIS — Z5181 Encounter for therapeutic drug level monitoring: Secondary | ICD-10-CM | POA: Diagnosis not present

## 2020-04-07 DIAGNOSIS — M170 Bilateral primary osteoarthritis of knee: Secondary | ICD-10-CM | POA: Diagnosis not present

## 2020-04-07 DIAGNOSIS — F330 Major depressive disorder, recurrent, mild: Secondary | ICD-10-CM | POA: Diagnosis not present

## 2020-04-13 DIAGNOSIS — E1122 Type 2 diabetes mellitus with diabetic chronic kidney disease: Secondary | ICD-10-CM | POA: Diagnosis not present

## 2020-04-13 DIAGNOSIS — D518 Other vitamin B12 deficiency anemias: Secondary | ICD-10-CM | POA: Diagnosis not present

## 2020-04-13 DIAGNOSIS — E559 Vitamin D deficiency, unspecified: Secondary | ICD-10-CM | POA: Diagnosis not present

## 2020-04-13 DIAGNOSIS — I10 Essential (primary) hypertension: Secondary | ICD-10-CM | POA: Diagnosis not present

## 2020-04-13 DIAGNOSIS — M170 Bilateral primary osteoarthritis of knee: Secondary | ICD-10-CM | POA: Diagnosis not present

## 2020-04-13 DIAGNOSIS — E782 Mixed hyperlipidemia: Secondary | ICD-10-CM | POA: Diagnosis not present

## 2020-04-13 DIAGNOSIS — M158 Other polyosteoarthritis: Secondary | ICD-10-CM | POA: Diagnosis not present

## 2020-04-13 DIAGNOSIS — F330 Major depressive disorder, recurrent, mild: Secondary | ICD-10-CM | POA: Diagnosis not present

## 2020-04-13 DIAGNOSIS — E1165 Type 2 diabetes mellitus with hyperglycemia: Secondary | ICD-10-CM | POA: Diagnosis not present

## 2020-04-13 DIAGNOSIS — J441 Chronic obstructive pulmonary disease with (acute) exacerbation: Secondary | ICD-10-CM | POA: Diagnosis not present

## 2020-04-13 DIAGNOSIS — J449 Chronic obstructive pulmonary disease, unspecified: Secondary | ICD-10-CM | POA: Diagnosis not present

## 2020-04-13 DIAGNOSIS — E038 Other specified hypothyroidism: Secondary | ICD-10-CM | POA: Diagnosis not present

## 2020-05-05 DIAGNOSIS — I10 Essential (primary) hypertension: Secondary | ICD-10-CM | POA: Diagnosis not present

## 2020-05-05 DIAGNOSIS — E782 Mixed hyperlipidemia: Secondary | ICD-10-CM | POA: Diagnosis not present

## 2020-05-05 DIAGNOSIS — Z7901 Long term (current) use of anticoagulants: Secondary | ICD-10-CM | POA: Diagnosis not present

## 2020-05-05 DIAGNOSIS — E038 Other specified hypothyroidism: Secondary | ICD-10-CM | POA: Diagnosis not present

## 2020-05-05 DIAGNOSIS — Z86718 Personal history of other venous thrombosis and embolism: Secondary | ICD-10-CM | POA: Diagnosis not present

## 2020-05-05 DIAGNOSIS — D6851 Activated protein C resistance: Secondary | ICD-10-CM | POA: Diagnosis not present

## 2020-05-05 DIAGNOSIS — E7849 Other hyperlipidemia: Secondary | ICD-10-CM | POA: Diagnosis not present

## 2020-05-05 DIAGNOSIS — E119 Type 2 diabetes mellitus without complications: Secondary | ICD-10-CM | POA: Diagnosis not present

## 2020-05-05 DIAGNOSIS — Z5181 Encounter for therapeutic drug level monitoring: Secondary | ICD-10-CM | POA: Diagnosis not present

## 2020-05-05 DIAGNOSIS — Z79899 Other long term (current) drug therapy: Secondary | ICD-10-CM | POA: Diagnosis not present

## 2020-05-05 DIAGNOSIS — E559 Vitamin D deficiency, unspecified: Secondary | ICD-10-CM | POA: Diagnosis not present

## 2020-05-05 DIAGNOSIS — D518 Other vitamin B12 deficiency anemias: Secondary | ICD-10-CM | POA: Diagnosis not present

## 2020-05-18 DIAGNOSIS — J449 Chronic obstructive pulmonary disease, unspecified: Secondary | ICD-10-CM | POA: Diagnosis not present

## 2020-05-18 DIAGNOSIS — M170 Bilateral primary osteoarthritis of knee: Secondary | ICD-10-CM | POA: Diagnosis not present

## 2020-05-18 DIAGNOSIS — M158 Other polyosteoarthritis: Secondary | ICD-10-CM | POA: Diagnosis not present

## 2020-05-18 DIAGNOSIS — I10 Essential (primary) hypertension: Secondary | ICD-10-CM | POA: Diagnosis not present

## 2020-05-18 DIAGNOSIS — E782 Mixed hyperlipidemia: Secondary | ICD-10-CM | POA: Diagnosis not present

## 2020-05-18 DIAGNOSIS — J441 Chronic obstructive pulmonary disease with (acute) exacerbation: Secondary | ICD-10-CM | POA: Diagnosis not present

## 2020-05-18 DIAGNOSIS — F330 Major depressive disorder, recurrent, mild: Secondary | ICD-10-CM | POA: Diagnosis not present

## 2020-05-18 DIAGNOSIS — E038 Other specified hypothyroidism: Secondary | ICD-10-CM | POA: Diagnosis not present

## 2020-05-18 DIAGNOSIS — E1165 Type 2 diabetes mellitus with hyperglycemia: Secondary | ICD-10-CM | POA: Diagnosis not present

## 2020-05-18 DIAGNOSIS — E1122 Type 2 diabetes mellitus with diabetic chronic kidney disease: Secondary | ICD-10-CM | POA: Diagnosis not present

## 2020-05-18 DIAGNOSIS — E559 Vitamin D deficiency, unspecified: Secondary | ICD-10-CM | POA: Diagnosis not present

## 2020-05-18 DIAGNOSIS — D518 Other vitamin B12 deficiency anemias: Secondary | ICD-10-CM | POA: Diagnosis not present

## 2020-06-10 DIAGNOSIS — M79606 Pain in leg, unspecified: Secondary | ICD-10-CM | POA: Insufficient documentation

## 2020-06-10 DIAGNOSIS — I6529 Occlusion and stenosis of unspecified carotid artery: Secondary | ICD-10-CM | POA: Insufficient documentation

## 2020-06-10 DIAGNOSIS — I10 Essential (primary) hypertension: Secondary | ICD-10-CM | POA: Insufficient documentation

## 2020-06-10 DIAGNOSIS — D689 Coagulation defect, unspecified: Secondary | ICD-10-CM | POA: Insufficient documentation

## 2020-06-11 ENCOUNTER — Ambulatory Visit: Payer: PPO | Admitting: Cardiology

## 2020-06-11 ENCOUNTER — Encounter: Payer: Self-pay | Admitting: Cardiology

## 2020-06-11 ENCOUNTER — Other Ambulatory Visit: Payer: Self-pay

## 2020-06-11 VITALS — BP 136/74 | HR 56 | Ht 63.0 in | Wt 169.8 lb

## 2020-06-11 DIAGNOSIS — I480 Paroxysmal atrial fibrillation: Secondary | ICD-10-CM

## 2020-06-11 DIAGNOSIS — E782 Mixed hyperlipidemia: Secondary | ICD-10-CM | POA: Diagnosis not present

## 2020-06-11 DIAGNOSIS — D6869 Other thrombophilia: Secondary | ICD-10-CM

## 2020-06-11 DIAGNOSIS — D6859 Other primary thrombophilia: Secondary | ICD-10-CM

## 2020-06-11 DIAGNOSIS — I110 Hypertensive heart disease with heart failure: Secondary | ICD-10-CM

## 2020-06-11 DIAGNOSIS — Z951 Presence of aortocoronary bypass graft: Secondary | ICD-10-CM | POA: Diagnosis not present

## 2020-06-11 DIAGNOSIS — Z79899 Other long term (current) drug therapy: Secondary | ICD-10-CM | POA: Diagnosis not present

## 2020-06-11 DIAGNOSIS — Z7901 Long term (current) use of anticoagulants: Secondary | ICD-10-CM | POA: Diagnosis not present

## 2020-06-11 DIAGNOSIS — I25118 Atherosclerotic heart disease of native coronary artery with other forms of angina pectoris: Secondary | ICD-10-CM

## 2020-06-11 NOTE — Progress Notes (Signed)
Cardiology Office Note:    Date:  06/11/2020   ID:  Tina Dominguez, DOB [birth year], MRN [id number]  PCP:  Tina Nasuti, MD  Cardiologist:  Tina More, MD    Referring MD: Tina Nasuti, MD    ASSESSMENT:    1. PAF (paroxysmal atrial fibrillation) ([place])   2. On amiodarone therapy   3. Chronic anticoagulation   4. Coronary artery disease of native artery of native heart with stable angina pectoris ([place])   5. S/P CABG x 3   6. Hypertensive heart disease with heart failure ([place])   7. Mixed hyperlipidemia   8. Acquired hypercoagulable state ([place])    PLAN:    In order of problems listed above:  1. With atrial fibrillation she is done well stable maintaining sinus rhythm continue low-dose amiodarone labs are followed with her PCP and I will remind them to check CMP and thyroids every 6 months.  Its been 1 year and we will do a chest x-ray to screen for pulmonary toxicity.  She will continue her current anticoagulant especially with her hypercoagulable state. 2. Stable CAD following CABG asymptomatic New [place] Heart Association class I on current treatment and she will continue aspirin beta-blocker and her high intensity statin at this time I would not advise an ischemia evaluation 3. BP at target continue current therapy including centrally active clonidine and calcium channel blocker 4. Stable continue high intensity statin lipids are at target 5. Continue long-term anticoagulant Xarelto   Next appointment: 6 months   Medication Adjustments/Labs and Tests Ordered: Current medicines are reviewed at length with the patient today.  Concerns regarding medicines are outlined above.  Orders Placed This Encounter  Procedures  . EKG 12-Lead   No orders of the defined types were placed in this encounter.   Chief Complaint  Patient presents with  . Follow-up  . Atrial Fibrillation  . Coronary Artery Disease    History of Present Illness:    Tina Dominguez is a 79 y.o.  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  She has very good Firefighter has had her vaccine and practices the 3 WS.  I discussed with her post exposure prophylaxis for COVID-19 if needed.  From cardiac perspective she does well she tracks her heart rate at home no rapid or irregular heart rhythms blood pressures in range no bleeding from her anticoagulant no muscle pain or weakness or a statin and no chest pain shortness of breath palpitation or syncope.  Most recent labs available to me 12/13/2019 shows cholesterol 159 LDL 62 triglyceride 172 HDL 69 A1c 5.5 creatinine 1.09 and TSH 7.21.  She tells me she has had Dominguez recent labs have requested from her PCP.  Past Medical History:  Diagnosis Date  . Aftercare following surgery of the circulatory system, [place] 10/14/2013  . Anemia 12/13/2017  . Anxiety 12/13/2017  . Arthritis   . CAD (coronary artery disease) 12/10/2018  . 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HCC) 12/18/2017  . Carotid artery occlusion   . Carotid stenosis, bilateral 05/26/2011  . CKD (chronic kidney disease) stage 3, GFR 30-59 ml/min 12/13/2017  . Clotting disorder ([place])   .  Coronary artery disease   . Depression   . Dysphagia 04/17/2017  . Factor V Leiden mutation ([place]) 12/18/2017  . GERD (gastroesophageal reflux disease) 05/26/2011  . GI bleeding 10/04/2017  . Gout 12/13/2017  . Hiatal hernia 12/13/2017  . 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   Marland Kitchen History of recurrent TIAs 12/13/2017  . History of right-sided carotid endarterectomy 10/10/2011  .  Hyperlipidemia   . Hypertension   . Hypertensive heart disease with heart failure ([place]) 01/29/2019  . Leg pain    with walking  . Occlusion and stenosis of carotid artery without mention of cerebral infarction 10/08/2012  . Osteoarthritis 05/26/2011  . Osteoporosis   . PAT (paroxysmal atrial tachycardia) ([place]) 12/05/2018  . PE (pulmonary embolism) 05/26/2011  . 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[place]) 04/17/2017  . Reflux   . Renal failure 12/13/2017  . S/P IVC filter 12/13/2017  . Schatzki's ring 12/13/2017  . Stroke [facility])    X's 4  . Valvular heart disease 12/13/2017    Past Surgical History:  Procedure Laterality Date  . ABDOMINAL HYSTERECTOMY  1980   TAH   . APPENDECTOMY    . BACK SURGERY  06/2011  . BREAST SURGERY     x2  . CAROTID ENDARTERECTOMY  11/11/2010   Right CEA  . CORONARY ARTERY BYPASS GRAFT N/A 12/11/2018   Procedure: CORONARY ARTERY BYPASS GRAFTING (CABG) x three , using left internal mammary artery, and right leg greater saphenous vein harvested endoscopically - SVG to OM1, SVG to Distal RCA;  Surgeon: Grace Isaac, MD;  Location: [place];  Service: Open Heart Surgery;  Laterality: N/A;  . IVC FILTER INSERTION    . JOINT REPLACEMENT Right 2005   Right Total Knee  . JOINT REPLACEMENT Left 2005   Left Total Knee  . LEFT HEART CATH AND CORONARY ANGIOGRAPHY N/A 12/10/2018   Procedure: LEFT HEART CATH AND CORONARY ANGIOGRAPHY;  Surgeon: Jettie Booze, MD;  Location: [place] [facility];  Service: Cardiovascular;  Laterality: N/A;  . SHOULDER SURGERY     right  . SPINE SURGERY  07-11-11   Decomp. Laminectomy, fusion of L4-5,L5-S1 by Dr. Saintclair Halsted  . TEE WITHOUT CARDIOVERSION N/A 12/11/2018   Procedure: TRANSESOPHAGEAL ECHOCARDIOGRAM (TEE);  Surgeon: Grace Isaac, MD;  Location: [place];  Service: Open Heart Surgery;  Laterality: N/A;  . TOTAL KNEE ARTHROPLASTY     right  and left    Current Medications: Current Meds  Medication Sig  . acetaminophen (TYLENOL) 500 MG  tablet Take 500-1,000 mg by mouth every 6 (six) hours as needed for moderate pain.   Marland Kitchen amiodarone (PACERONE) 200 MG tablet Take 1 tablet (200 mg total) by mouth daily.  Marland Kitchen amLODipine (NORVASC) 5 MG tablet Take 1 tablet (5 mg total) by mouth daily.  Marland Kitchen aspirin EC 81 MG EC tablet Take 1 tablet (81 mg total) by mouth daily.  Marland Kitchen atorvastatin (LIPITOR) 40 MG tablet TAKE 1 TABLET BY MOUTH EVERY DAY AT 6PM  . citalopram (CELEXA) 10 MG tablet Take 10 mg by mouth at bedtime. Take with 40 mg to equal 50 mg at bedtime  . citalopram (CELEXA) 40 MG tablet Take 40 mg by mouth daily. Take with 10 mg to equal 50 mg at bedtime  . cloNIDine (CATAPRES) 0.1 MG tablet Take 1 tablet (0.1 mg total) by mouth at bedtime. Take 1 extra tablet daily  if your systolic BP (top number) is greater than 180 and if your heart rate is greater than 100.  . furosemide (LASIX) 20 MG tablet Take 1 tablet by mouth twice a day for 2 days then go back to taking 1 tablet by mouth daily (Patient taking differently: Take 20 mg by mouth daily. T)  . levothyroxine (SYNTHROID) 100 MCG tablet Take 100 mcg by mouth daily.  Marland Kitchen linaclotide (LINZESS) 145 MCG CAPS capsule Take 145 mcg by mouth every other day.   . metoprolol tartrate (LOPRESSOR) 25 MG tablet Metoprolol tartrate 12.5 mg (half tablet) in the morning daily. Hold for heart rate <100.  . mirtazapine (REMERON) 30 MG tablet Take 30 mg by mouth at bedtime.   . Multiple Vitamin (MULTIVITAMIN) tablet Take 1 tablet by mouth at bedtime.   . Oxycodone HCl 10 MG TABS Take 10 mg by mouth every 6 (six) hours as needed (pain).   . pantoprazole (PROTONIX) 40 MG tablet TAKE 1 TABLET(40 MG) BY MOUTH TWICE DAILY  . potassium chloride SA (K-DUR) 20 MEQ tablet Take 1 Tab (88meq) every other day with lasix  . XARELTO 20 MG TABS tablet Take 20 mg by mouth daily.     Allergies:   Patient has no known allergies.   Social History   Socioeconomic History  . Marital status: Widowed    Spouse name: Not on file    . Number of children: Not on file  . Years of education: Not on file  . Highest education level: Not on file  Occupational History  . Not on file  Tobacco Use  . Smoking status: Never Smoker  . Smokeless tobacco: Never Used  Vaping Use  . Vaping Use: Never used  Substance and Sexual Activity  . Alcohol use: No  . Drug use: No  . Sexual activity: Not on file  Other Topics Concern  . Not on file  Social History Narrative  . Not on file   Social Determinants of Health   Financial Resource Strain:   . Difficulty of Paying Living Expenses: Not on file  Food Insecurity:   . Worried About Charity fundraiser in the Last Year: Not on file  . Ran Out of Food in the Last Year: Not on file  Transportation Needs:   . Lack of Transportation (Medical): Not on file  . Lack of Transportation (Non-Medical): Not on file  Physical Activity:   . Days of Exercise per Week: Not on file  . Minutes of Exercise per Session: Not on file  Stress:   . Feeling of Stress : Not on file  Social Connections:   . Frequency of Communication with Friends and Family: Not on file  . Frequency of Social Gatherings with Friends and Family: Not on file  . Attends Religious Services: Not on file  . Active Member of Clubs or Organizations: Not on file  . Attends Archivist Meetings: Not on file  . Marital Status: Not on file     Family History: The patient's family history includes Clotting disorder in her mother; Deep vein thrombosis in her mother and sister; Diabetes in her mother and sister; Heart disease in her brother and mother; Heart disease (age of onset: 46) in her father; Hyperlipidemia in her mother; Hypertension in her mother; Stroke in her maternal grandmother; Varicose Veins in her father. ROS:   Please see the history of present illness.    All other systems reviewed and are negative.  EKGs/Labs/Other Studies  Reviewed:    The following studies were reviewed today:  EKG:  EKG  ordered today and personally reviewed.  The ekg ordered today demonstrates sinus bradycardia 56 bpm poor R wave progression late transition otherwise normal EKG  Recent Labs: 12/13/2019: ALT 9; BUN 24; Creatinine, Ser 1.09; Hemoglobin 12.9; Platelets 382; Potassium 4.2; Sodium 143; TSH 7.210  Recent Lipid Panel    Component Value Date/Time   CHOL 159 12/13/2019 1346   TRIG 172 (H) 12/13/2019 1346   HDL 69 12/13/2019 1346   CHOLHDL 2.3 12/13/2019 1346   CHOLHDL 3.0 12/18/2018 0246   VLDL 25 12/18/2018 0246   LDLCALC 62 12/13/2019 1346    Physical Exam:    VS:  BP 136/74   Pulse (!) 56   Ht 5\' 3"  (1.6 m)   Wt 169 lb 12.8 oz (77 kg)   SpO2 97%   BMI 30.08 kg/m     Wt Readings from Last 3 Encounters:  06/11/20 169 lb 12.8 oz (77 kg)  12/13/19 172 lb 3.2 oz (78.1 kg)  09/12/19 160 lb (72.6 kg)     GEN:  Well nourished, well developed in no acute distress HEENT: Normal NECK: No JVD; No carotid bruits LYMPHATICS: No lymphadenopathy CARDIAC: RRR, no murmurs, rubs, gallops RESPIRATORY:  Clear to auscultation without rales, wheezing or rhonchi  ABDOMEN: Soft, non-tender, non-distended MUSCULOSKELETAL:  No edema; No deformity  SKIN: Warm and dry NEUROLOGIC:  Alert and oriented x 3 PSYCHIATRIC:  Normal affect    Signed, Tina More, MD  06/11/2020 3:31 PM    [facility]

## 2020-06-11 NOTE — Patient Instructions (Signed)
Medication Instructions:  Your physician recommends that you continue on your current medications as directed. Please refer to the Current Medication list given to you today.  *If you need a refill on your cardiac medications before your next appointment, please call your pharmacy*   Lab Work: None If you have labs (blood work) drawn today and your tests are completely normal, you will receive your results only by: Marland Kitchen MyChart Message (if you have MyChart) OR . A paper copy in the mail If you have any lab test that is abnormal or we need to change your treatment, we will call you to review the results.   Testing/Procedures: We have given you an order form for you to have a chest x-ray completed.    Follow-Up: At [facility], you and your health needs are our priority.  As part of our continuing mission to provide you with exceptional heart care, we have created designated Provider Care Teams.  These Care Teams include your primary Cardiologist (physician) and Advanced Practice Providers (APPs -  Physician Assistants and Nurse Practitioners) who all work together to provide you with the care you need, when you need it.  We recommend signing up for the patient portal called "MyChart".  Sign up information is provided on this After Visit Summary.  MyChart is used to connect with patients for Virtual Visits (Telemedicine).  Patients are able to view lab/test results, encounter notes, upcoming appointments, etc.  Non-urgent messages can be sent to your provider as well.   To learn more about what you can do with MyChart, go to [email or web address].    Your next appointment:   6 month(s)  The format for your next appointment:   In Person  Provider:   Shirlee More, MD   Other Instructions

## 2020-06-12 DIAGNOSIS — Z79899 Other long term (current) drug therapy: Secondary | ICD-10-CM | POA: Diagnosis not present

## 2020-06-12 DIAGNOSIS — J811 Chronic pulmonary edema: Secondary | ICD-10-CM | POA: Diagnosis not present

## 2020-06-15 ENCOUNTER — Other Ambulatory Visit: Payer: Self-pay | Admitting: Cardiology

## 2020-06-15 DIAGNOSIS — E782 Mixed hyperlipidemia: Secondary | ICD-10-CM | POA: Diagnosis not present

## 2020-06-15 DIAGNOSIS — E1122 Type 2 diabetes mellitus with diabetic chronic kidney disease: Secondary | ICD-10-CM | POA: Diagnosis not present

## 2020-06-15 DIAGNOSIS — M158 Other polyosteoarthritis: Secondary | ICD-10-CM | POA: Diagnosis not present

## 2020-06-15 DIAGNOSIS — M170 Bilateral primary osteoarthritis of knee: Secondary | ICD-10-CM | POA: Diagnosis not present

## 2020-06-15 DIAGNOSIS — E038 Other specified hypothyroidism: Secondary | ICD-10-CM | POA: Diagnosis not present

## 2020-06-15 DIAGNOSIS — F330 Major depressive disorder, recurrent, mild: Secondary | ICD-10-CM | POA: Diagnosis not present

## 2020-06-15 DIAGNOSIS — D518 Other vitamin B12 deficiency anemias: Secondary | ICD-10-CM | POA: Diagnosis not present

## 2020-06-15 DIAGNOSIS — J449 Chronic obstructive pulmonary disease, unspecified: Secondary | ICD-10-CM | POA: Diagnosis not present

## 2020-06-15 DIAGNOSIS — J441 Chronic obstructive pulmonary disease with (acute) exacerbation: Secondary | ICD-10-CM | POA: Diagnosis not present

## 2020-06-15 DIAGNOSIS — I10 Essential (primary) hypertension: Secondary | ICD-10-CM | POA: Diagnosis not present

## 2020-06-15 DIAGNOSIS — E559 Vitamin D deficiency, unspecified: Secondary | ICD-10-CM | POA: Diagnosis not present

## 2020-06-15 DIAGNOSIS — E1165 Type 2 diabetes mellitus with hyperglycemia: Secondary | ICD-10-CM | POA: Diagnosis not present

## 2020-07-03 DIAGNOSIS — Z23 Encounter for immunization: Secondary | ICD-10-CM | POA: Diagnosis not present

## 2020-07-07 DIAGNOSIS — F330 Major depressive disorder, recurrent, mild: Secondary | ICD-10-CM | POA: Diagnosis not present

## 2020-07-07 DIAGNOSIS — E1122 Type 2 diabetes mellitus with diabetic chronic kidney disease: Secondary | ICD-10-CM | POA: Diagnosis not present

## 2020-07-07 DIAGNOSIS — E559 Vitamin D deficiency, unspecified: Secondary | ICD-10-CM | POA: Diagnosis not present

## 2020-07-07 DIAGNOSIS — E038 Other specified hypothyroidism: Secondary | ICD-10-CM | POA: Diagnosis not present

## 2020-07-07 DIAGNOSIS — D518 Other vitamin B12 deficiency anemias: Secondary | ICD-10-CM | POA: Diagnosis not present

## 2020-07-07 DIAGNOSIS — I10 Essential (primary) hypertension: Secondary | ICD-10-CM | POA: Diagnosis not present

## 2020-07-07 DIAGNOSIS — M158 Other polyosteoarthritis: Secondary | ICD-10-CM | POA: Diagnosis not present

## 2020-07-07 DIAGNOSIS — M170 Bilateral primary osteoarthritis of knee: Secondary | ICD-10-CM | POA: Diagnosis not present

## 2020-07-07 DIAGNOSIS — J449 Chronic obstructive pulmonary disease, unspecified: Secondary | ICD-10-CM | POA: Diagnosis not present

## 2020-07-07 DIAGNOSIS — J441 Chronic obstructive pulmonary disease with (acute) exacerbation: Secondary | ICD-10-CM | POA: Diagnosis not present

## 2020-07-07 DIAGNOSIS — E1165 Type 2 diabetes mellitus with hyperglycemia: Secondary | ICD-10-CM | POA: Diagnosis not present

## 2020-07-07 DIAGNOSIS — E782 Mixed hyperlipidemia: Secondary | ICD-10-CM | POA: Diagnosis not present

## 2020-08-04 DIAGNOSIS — E875 Hyperkalemia: Secondary | ICD-10-CM | POA: Diagnosis not present

## 2020-08-04 DIAGNOSIS — M170 Bilateral primary osteoarthritis of knee: Secondary | ICD-10-CM | POA: Diagnosis not present

## 2020-08-04 DIAGNOSIS — E038 Other specified hypothyroidism: Secondary | ICD-10-CM | POA: Diagnosis not present

## 2020-08-04 DIAGNOSIS — E782 Mixed hyperlipidemia: Secondary | ICD-10-CM | POA: Diagnosis not present

## 2020-08-04 DIAGNOSIS — R079 Chest pain, unspecified: Secondary | ICD-10-CM | POA: Diagnosis not present

## 2020-08-04 DIAGNOSIS — I482 Chronic atrial fibrillation, unspecified: Secondary | ICD-10-CM | POA: Diagnosis not present

## 2020-08-04 DIAGNOSIS — K219 Gastro-esophageal reflux disease without esophagitis: Secondary | ICD-10-CM | POA: Diagnosis not present

## 2020-08-04 DIAGNOSIS — Z79899 Other long term (current) drug therapy: Secondary | ICD-10-CM | POA: Diagnosis not present

## 2020-08-04 DIAGNOSIS — F330 Major depressive disorder, recurrent, mild: Secondary | ICD-10-CM | POA: Diagnosis not present

## 2020-08-04 DIAGNOSIS — I10 Essential (primary) hypertension: Secondary | ICD-10-CM | POA: Diagnosis not present

## 2020-08-04 DIAGNOSIS — K5900 Constipation, unspecified: Secondary | ICD-10-CM | POA: Diagnosis not present

## 2020-08-19 DIAGNOSIS — F330 Major depressive disorder, recurrent, mild: Secondary | ICD-10-CM | POA: Diagnosis not present

## 2020-08-19 DIAGNOSIS — I10 Essential (primary) hypertension: Secondary | ICD-10-CM | POA: Diagnosis not present

## 2020-08-19 DIAGNOSIS — M158 Other polyosteoarthritis: Secondary | ICD-10-CM | POA: Diagnosis not present

## 2020-08-19 DIAGNOSIS — J441 Chronic obstructive pulmonary disease with (acute) exacerbation: Secondary | ICD-10-CM | POA: Diagnosis not present

## 2020-08-19 DIAGNOSIS — E038 Other specified hypothyroidism: Secondary | ICD-10-CM | POA: Diagnosis not present

## 2020-08-19 DIAGNOSIS — E1165 Type 2 diabetes mellitus with hyperglycemia: Secondary | ICD-10-CM | POA: Diagnosis not present

## 2020-08-19 DIAGNOSIS — D518 Other vitamin B12 deficiency anemias: Secondary | ICD-10-CM | POA: Diagnosis not present

## 2020-08-19 DIAGNOSIS — E559 Vitamin D deficiency, unspecified: Secondary | ICD-10-CM | POA: Diagnosis not present

## 2020-08-19 DIAGNOSIS — E1122 Type 2 diabetes mellitus with diabetic chronic kidney disease: Secondary | ICD-10-CM | POA: Diagnosis not present

## 2020-08-19 DIAGNOSIS — E782 Mixed hyperlipidemia: Secondary | ICD-10-CM | POA: Diagnosis not present

## 2020-08-19 DIAGNOSIS — J449 Chronic obstructive pulmonary disease, unspecified: Secondary | ICD-10-CM | POA: Diagnosis not present

## 2020-08-19 DIAGNOSIS — M170 Bilateral primary osteoarthritis of knee: Secondary | ICD-10-CM | POA: Diagnosis not present

## 2020-11-03 DIAGNOSIS — E559 Vitamin D deficiency, unspecified: Secondary | ICD-10-CM | POA: Diagnosis not present

## 2020-11-03 DIAGNOSIS — I482 Chronic atrial fibrillation, unspecified: Secondary | ICD-10-CM | POA: Diagnosis not present

## 2020-11-03 DIAGNOSIS — D6851 Activated protein C resistance: Secondary | ICD-10-CM | POA: Diagnosis not present

## 2020-11-03 DIAGNOSIS — Z7901 Long term (current) use of anticoagulants: Secondary | ICD-10-CM | POA: Diagnosis not present

## 2020-11-03 DIAGNOSIS — E119 Type 2 diabetes mellitus without complications: Secondary | ICD-10-CM | POA: Diagnosis not present

## 2020-11-03 DIAGNOSIS — E1165 Type 2 diabetes mellitus with hyperglycemia: Secondary | ICD-10-CM | POA: Diagnosis not present

## 2020-11-03 DIAGNOSIS — E782 Mixed hyperlipidemia: Secondary | ICD-10-CM | POA: Diagnosis not present

## 2020-11-03 DIAGNOSIS — E038 Other specified hypothyroidism: Secondary | ICD-10-CM | POA: Diagnosis not present

## 2020-11-03 DIAGNOSIS — Z5181 Encounter for therapeutic drug level monitoring: Secondary | ICD-10-CM | POA: Diagnosis not present

## 2020-11-03 DIAGNOSIS — I10 Essential (primary) hypertension: Secondary | ICD-10-CM | POA: Diagnosis not present

## 2020-11-03 DIAGNOSIS — D518 Other vitamin B12 deficiency anemias: Secondary | ICD-10-CM | POA: Diagnosis not present

## 2020-11-03 DIAGNOSIS — M064 Inflammatory polyarthropathy: Secondary | ICD-10-CM | POA: Diagnosis not present

## 2020-12-10 ENCOUNTER — Encounter: Payer: Self-pay | Admitting: Cardiology

## 2020-12-10 ENCOUNTER — Ambulatory Visit: Payer: PPO | Admitting: Cardiology

## 2020-12-10 ENCOUNTER — Other Ambulatory Visit: Payer: Self-pay

## 2020-12-10 VITALS — BP 152/80 | HR 49 | Ht 63.0 in | Wt 161.2 lb

## 2020-12-10 DIAGNOSIS — Z79899 Other long term (current) drug therapy: Secondary | ICD-10-CM | POA: Diagnosis not present

## 2020-12-10 DIAGNOSIS — Z7901 Long term (current) use of anticoagulants: Secondary | ICD-10-CM

## 2020-12-10 DIAGNOSIS — I110 Hypertensive heart disease with heart failure: Secondary | ICD-10-CM

## 2020-12-10 DIAGNOSIS — I25118 Atherosclerotic heart disease of native coronary artery with other forms of angina pectoris: Secondary | ICD-10-CM | POA: Diagnosis not present

## 2020-12-10 DIAGNOSIS — E782 Mixed hyperlipidemia: Secondary | ICD-10-CM | POA: Diagnosis not present

## 2020-12-10 DIAGNOSIS — I480 Paroxysmal atrial fibrillation: Secondary | ICD-10-CM | POA: Diagnosis not present

## 2020-12-10 NOTE — Patient Instructions (Signed)

## 2020-12-10 NOTE — Progress Notes (Addendum)
Cardiology Office Note:    Date:  12/10/2020   ID:  Tina Dominguez, DOB 21-Jul-1941, MRN [id number]  PCP:  Bonnita Nasuti, MD  Cardiologist:  Shirlee More, MD    Referring MD: Bonnita Nasuti, MD    ASSESSMENT:    1. PAF (paroxysmal atrial fibrillation) ([place])   2. On amiodarone therapy   3. Chronic anticoagulation   4. Coronary artery disease of native artery of native heart with stable angina pectoris ([place])   5. Hypertensive heart disease with heart failure ([place])   6. Mixed hyperlipidemia    PLAN:    In order of problems listed above:  1. She continues to have episodic rapid heart rhythm the last was called by an ambulance squad she had the EKG but forgot to bring it I asked her to come by the office tomorrow with it and she would like to pursue EP ablation if appropriate.  For now continue low-dose amiodarone as needed beta-blocker with sinus bradycardia 2. Continue current anticoagulant she is hypercoagulable 3. Stable CAD no anginal discomfort on current medical therapy 4. BP at target continue current treatment she is on central active clonidine loop diuretic calcium channel blocker 5. Continue her statin she had recent labs at her PCP office that we are requesting unavailable K PN   Next appointment: 6 months   Medication Adjustments/Labs and Tests Ordered: Current medicines are reviewed at length with the patient today.  Concerns regarding medicines are outlined above.  No orders of the defined types were placed in this encounter.  No orders of the defined types were placed in this encounter.   Chief Complaint  Patient presents with  . Follow-up  . Atrial Fibrillation  . Coronary Artery Disease    History of Present Illness:    Tina Dominguez is a 80 y.o. female with a hx of paroxysmal atrial fibrillation maintaining sinus rhythm on low-dose amiodarone, chronic anticoagulation, coronary artery disease with a history of CABG March 2020 hypertensive heart  disease with heart failure hyperlipidemia and acquired hyper coagulable state with factor V Leiden mutation.  She was last seen 06/11/2020.  Compliance with diet, lifestyle and medications: Yes  Chest x-ray [facility] 06/12/2020 showed no evidence of amiodarone lung toxicity.  Overall she is doing well but every few months she will have an episode of her heart racing the most recent lasted 20 minutes heart rate approached 200 bpm she had EMS at her home she took her beta-blocker and it resolved.  She has a copy of the EKG but forgot to bring it to my office.  She does not recognize whether it was atrial fibrillation flutter or SVT.  Outside of that she does well she takes low-dose amiodarone well-tolerated her anticoagulant without bleeding and has had no edema chest pain palpitation or syncope.  He brought to the office the EKGs that were done at the by the EMS squad at her home when she perceived her heart rate was 200 bpm they show sinus rhythm at a rate of 71 bpm this is not atrial fibrillation flutter or SVT. Past Medical History:  Diagnosis Date  . Aftercare following surgery of the circulatory system, [place] 10/14/2013  . Anemia 12/13/2017  . Anxiety 12/13/2017  . Arthritis   . CAD (coronary artery disease) 12/10/2018  . 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HCC) 12/18/2017  . Carotid artery occlusion   . Carotid stenosis, bilateral 05/26/2011  . CKD (chronic kidney disease) stage 3, GFR 30-59 ml/min (HCC)  12/13/2017  . Clotting disorder ([place])   . Coronary artery disease   . Depression   . Dysphagia 04/17/2017  . Factor V Leiden mutation ([place]) 12/18/2017  . GERD (gastroesophageal reflux disease) 05/26/2011  . GI bleeding 10/04/2017  . Gout 12/13/2017  . Hiatal hernia 12/13/2017  . 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   Marland Kitchen History of recurrent TIAs 12/13/2017  . History of right-sided carotid  endarterectomy 10/10/2011  . Hyperlipidemia   . Hypertension   . Hypertensive heart disease with heart failure ([place]) 01/29/2019  . Leg pain    with walking  . Occlusion and stenosis of carotid artery without mention of cerebral infarction 10/08/2012  . Osteoarthritis 05/26/2011  . Osteoporosis   . PAT (paroxysmal atrial tachycardia) ([place]) 12/05/2018  . PE (pulmonary embolism) 05/26/2011  . 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[place]) 04/17/2017  . Reflux   . Renal failure 12/13/2017  . S/P IVC filter 12/13/2017  . Schatzki's ring 12/13/2017  . Stroke [facility])    X's 4  . Valvular heart disease 12/13/2017    Past Surgical History:  Procedure Laterality Date  . ABDOMINAL HYSTERECTOMY  1980   TAH   . APPENDECTOMY    . BACK SURGERY  06/2011  . BREAST SURGERY     x2  . CAROTID ENDARTERECTOMY  11/11/2010   Right CEA  . CORONARY ARTERY BYPASS GRAFT N/A 12/11/2018   Procedure: CORONARY ARTERY BYPASS GRAFTING (CABG) x three , using left internal mammary artery, and right leg greater saphenous vein harvested endoscopically - SVG to OM1, SVG to Distal RCA;  Surgeon: Grace Isaac, MD;  Location: [place];  Service: Open Heart Surgery;  Laterality: N/A;  . IVC FILTER INSERTION    . JOINT REPLACEMENT Right 2005   Right Total Knee  . JOINT REPLACEMENT Left 2005   Left Total Knee  . LEFT HEART CATH AND CORONARY ANGIOGRAPHY N/A 12/10/2018   Procedure: LEFT HEART CATH AND CORONARY ANGIOGRAPHY;  Surgeon: Jettie Booze, MD;  Location: [place] [facility];  Service: Cardiovascular;  Laterality: N/A;  . SHOULDER SURGERY     right  . SPINE SURGERY  07-11-11   Decomp. Laminectomy, fusion of L4-5,L5-S1 by Dr. Saintclair Halsted  . TEE WITHOUT CARDIOVERSION N/A 12/11/2018   Procedure: TRANSESOPHAGEAL ECHOCARDIOGRAM (TEE);  Surgeon: Grace Isaac, MD;  Location: [place];  Service: Open Heart Surgery;  Laterality: N/A;  . TOTAL KNEE ARTHROPLASTY     right  and left    Current Medications: Current Meds  Medication Sig  .  acetaminophen (TYLENOL) 500 MG tablet Take 500-1,000 mg by mouth every 6 (six) hours as needed for moderate pain.   Marland Kitchen amiodarone (PACERONE) 200 MG tablet TAKE 1 TABLET(200 MG) BY MOUTH DAILY  . amLODipine (NORVASC) 5 MG tablet Take 1 tablet (5 mg total) by mouth daily.  Marland Kitchen aspirin EC 81 MG EC tablet Take 1 tablet (81 mg total) by mouth daily.  Marland Kitchen atorvastatin (LIPITOR) 40 MG tablet TAKE 1 TABLET BY MOUTH EVERY DAY AT 6PM  . citalopram (CELEXA) 10 MG tablet Take 10 mg by mouth at bedtime. Take with 40 mg to equal 50 mg at bedtime  . citalopram (CELEXA) 40 MG tablet Take 40 mg by mouth daily. Take with 10 mg to equal 50 mg at bedtime  . cloNIDine (CATAPRES) 0.1 MG tablet Take 1 tablet (0.1 mg total) by mouth  at bedtime. Take 1 extra tablet daily if your systolic BP (top number) is greater than 180 and if your heart rate is greater than 100.  . furosemide (LASIX) 20 MG tablet Take 20 mg by mouth daily.  Marland Kitchen levothyroxine (SYNTHROID) 100 MCG tablet Take 100 mcg by mouth daily.  Marland Kitchen linaclotide (LINZESS) 290 MCG CAPS capsule Take 290 mcg by mouth daily.  . metoprolol tartrate (LOPRESSOR) 25 MG tablet Metoprolol tartrate 12.5 mg (half tablet) in the morning daily. Hold for heart rate <100.  . mirtazapine (REMERON) 30 MG tablet Take 30 mg by mouth at bedtime.   . Multiple Vitamin (MULTIVITAMIN) tablet Take 1 tablet by mouth at bedtime.   . Oxycodone HCl 10 MG TABS Take 10 mg by mouth every 6 (six) hours as needed (pain).   . pantoprazole (PROTONIX) 40 MG tablet TAKE 1 TABLET(40 MG) BY MOUTH TWICE DAILY  . potassium chloride SA (K-DUR) 20 MEQ tablet Take 1 Tab (65meq) every other day with lasix  . XARELTO 20 MG TABS tablet Take 20 mg by mouth daily.     Allergies:   Patient has no known allergies.   Social History   Socioeconomic History  . Marital status: Widowed    Spouse name: Not on file  . Number of children: Not on file  . Years of education: Not on file  . Highest education level: Not on file   Occupational History  . Not on file  Tobacco Use  . Smoking status: Never Smoker  . Smokeless tobacco: Never Used  Vaping Use  . Vaping Use: Never used  Substance and Sexual Activity  . Alcohol use: No  . Drug use: No  . Sexual activity: Not on file  Other Topics Concern  . Not on file  Social History Narrative  . Not on file   Social Determinants of Health   Financial Resource Strain: Not on file  Food Insecurity: Not on file  Transportation Needs: Not on file  Physical Activity: Not on file  Stress: Not on file  Social Connections: Not on file     Family History: The patient's family history includes Clotting disorder in her mother; Deep vein thrombosis in her mother and sister; Diabetes in her mother and sister; Heart disease in her brother and mother; Heart disease (age of onset: 64) in her father; Hyperlipidemia in her mother; Hypertension in her mother; Stroke in her maternal grandmother; Varicose Veins in her father. ROS:   Please see the history of present illness.    All other systems reviewed and are negative.  EKGs/Labs/Other Studies Reviewed:    The following studies were reviewed today:  EKG:  EKG ordered today and personally reviewed.  The ekg ordered today demonstrates sinus bradycardia 49 bpm otherwise normal EKG  Recent Labs: 12/13/2019: ALT 9; BUN 24; Creatinine, Ser 1.09; Hemoglobin 12.9; Platelets 382; Potassium 4.2; Sodium 143; TSH 7.210  Recent Lipid Panel    Component Value Date/Time   CHOL 159 12/13/2019 1346   TRIG 172 (H) 12/13/2019 1346   HDL 69 12/13/2019 1346   CHOLHDL 2.3 12/13/2019 1346   CHOLHDL 3.0 12/18/2018 0246   VLDL 25 12/18/2018 0246   LDLCALC 62 12/13/2019 1346    Physical Exam:    VS:  BP (!) 152/80   Pulse (!) 49   Ht 5\' 3"  (1.6 m)   Wt 161 lb 3.2 oz (73.1 kg)   SpO2 96%   BMI 28.56 kg/m     Wt Readings from  Last 3 Encounters:  12/10/20 161 lb 3.2 oz (73.1 kg)  06/11/20 169 lb 12.8 oz (77 kg)  12/13/19 172 lb  3.2 oz (78.1 kg)    Repeat blood pressure 142/80 GEN:  Well nourished, well developed in no acute distress HEENT: Normal NECK: No JVD; No carotid bruits LYMPHATICS: No lymphadenopathy CARDIAC: RRR, no murmurs, rubs, gallops RESPIRATORY:  Clear to auscultation without rales, wheezing or rhonchi  ABDOMEN: Soft, non-tender, non-distended MUSCULOSKELETAL:  No edema; No deformity  SKIN: Warm and dry NEUROLOGIC:  Alert and oriented x 3 PSYCHIATRIC:  Normal affect    Signed, Shirlee More, MD  12/10/2020 2:38 PM    [facility]

## 2020-12-11 NOTE — Addendum Note (Signed)
Addended by: Resa Miner I on: 12/11/2020 10:03 AM   Modules accepted: Orders

## 2020-12-27 ENCOUNTER — Other Ambulatory Visit: Payer: Self-pay | Admitting: Cardiology

## 2020-12-28 DIAGNOSIS — Z87891 Personal history of nicotine dependence: Secondary | ICD-10-CM | POA: Diagnosis not present

## 2020-12-28 DIAGNOSIS — K449 Diaphragmatic hernia without obstruction or gangrene: Secondary | ICD-10-CM | POA: Diagnosis not present

## 2020-12-28 DIAGNOSIS — R0602 Shortness of breath: Secondary | ICD-10-CM | POA: Diagnosis not present

## 2020-12-28 DIAGNOSIS — I10 Essential (primary) hypertension: Secondary | ICD-10-CM | POA: Diagnosis not present

## 2020-12-28 DIAGNOSIS — Z20822 Contact with and (suspected) exposure to covid-19: Secondary | ICD-10-CM | POA: Diagnosis not present

## 2020-12-28 DIAGNOSIS — J189 Pneumonia, unspecified organism: Secondary | ICD-10-CM | POA: Diagnosis not present

## 2020-12-28 NOTE — Telephone Encounter (Signed)
Atorvastatin approved and sent 

## 2021-02-02 DIAGNOSIS — I10 Essential (primary) hypertension: Secondary | ICD-10-CM | POA: Diagnosis not present

## 2021-02-02 DIAGNOSIS — Z5181 Encounter for therapeutic drug level monitoring: Secondary | ICD-10-CM | POA: Diagnosis not present

## 2021-02-02 DIAGNOSIS — E782 Mixed hyperlipidemia: Secondary | ICD-10-CM | POA: Diagnosis not present

## 2021-02-02 DIAGNOSIS — M170 Bilateral primary osteoarthritis of knee: Secondary | ICD-10-CM | POA: Diagnosis not present

## 2021-02-02 DIAGNOSIS — Z0000 Encounter for general adult medical examination without abnormal findings: Secondary | ICD-10-CM | POA: Diagnosis not present

## 2021-02-02 DIAGNOSIS — K219 Gastro-esophageal reflux disease without esophagitis: Secondary | ICD-10-CM | POA: Diagnosis not present

## 2021-02-10 DIAGNOSIS — K219 Gastro-esophageal reflux disease without esophagitis: Secondary | ICD-10-CM | POA: Diagnosis not present

## 2021-02-10 DIAGNOSIS — Z1211 Encounter for screening for malignant neoplasm of colon: Secondary | ICD-10-CM | POA: Diagnosis not present

## 2021-02-10 DIAGNOSIS — K315 Obstruction of duodenum: Secondary | ICD-10-CM | POA: Diagnosis not present

## 2021-02-22 DIAGNOSIS — E1165 Type 2 diabetes mellitus with hyperglycemia: Secondary | ICD-10-CM | POA: Diagnosis not present

## 2021-02-22 DIAGNOSIS — E559 Vitamin D deficiency, unspecified: Secondary | ICD-10-CM | POA: Diagnosis not present

## 2021-02-22 DIAGNOSIS — M158 Other polyosteoarthritis: Secondary | ICD-10-CM | POA: Diagnosis not present

## 2021-02-22 DIAGNOSIS — J449 Chronic obstructive pulmonary disease, unspecified: Secondary | ICD-10-CM | POA: Diagnosis not present

## 2021-02-22 DIAGNOSIS — N183 Chronic kidney disease, stage 3 unspecified: Secondary | ICD-10-CM | POA: Diagnosis not present

## 2021-02-22 DIAGNOSIS — M170 Bilateral primary osteoarthritis of knee: Secondary | ICD-10-CM | POA: Diagnosis not present

## 2021-02-22 DIAGNOSIS — I10 Essential (primary) hypertension: Secondary | ICD-10-CM | POA: Diagnosis not present

## 2021-02-22 DIAGNOSIS — E782 Mixed hyperlipidemia: Secondary | ICD-10-CM | POA: Diagnosis not present

## 2021-02-22 DIAGNOSIS — D518 Other vitamin B12 deficiency anemias: Secondary | ICD-10-CM | POA: Diagnosis not present

## 2021-02-22 DIAGNOSIS — E038 Other specified hypothyroidism: Secondary | ICD-10-CM | POA: Diagnosis not present

## 2021-02-22 DIAGNOSIS — F330 Major depressive disorder, recurrent, mild: Secondary | ICD-10-CM | POA: Diagnosis not present

## 2021-02-22 DIAGNOSIS — J441 Chronic obstructive pulmonary disease with (acute) exacerbation: Secondary | ICD-10-CM | POA: Diagnosis not present

## 2021-03-08 ENCOUNTER — Telehealth: Payer: Self-pay | Admitting: Cardiology

## 2021-03-08 NOTE — Telephone Encounter (Signed)
Spoke to patient. She reports that she has been having swelling in her legs and shortness of breath for 2 weeks. Mainly on exertion. She has gained 7 -8 lbs in 2 weeks. She currently takes lasix 20 mg daily. Will check with DOD for further instruction.

## 2021-03-08 NOTE — Telephone Encounter (Signed)
Called patient. Informed her that it was recommend for her to be seen in the emergency room for IV dieretics she verbally understood. No further questions.

## 2021-03-08 NOTE — Telephone Encounter (Signed)
Pt c/o swelling: STAT is pt has developed SOB within 24 hours  If swelling, where is the swelling located? LEGS AND FEET   How much weight have you gained and in what time span? 7 OR 8 LBS IN THE LAST TWO WEEKS   Have you gained 3 pounds in a day or 5 pounds in a week? 5 LBS IN A WEEK   Do you have a log of your daily weights (if so, list)? No   Are you currently taking a fluid pill? YES   Are you currently SOB? YES  Have you traveled recently? NO  Pt c/o Shortness Of Breath: STAT if SOB developed within the last 24 hours or pt is noticeably SOB on the phone  1. Are you currently SOB (can you hear that pt is SOB on the phone)? YES  2. How long have you been experiencing SOB? A COUPLE OF WEEKS   3. Are you SOB when sitting or when up moving around? BOTH  4. Are you currently experiencing any other symptoms? NO

## 2021-03-23 DIAGNOSIS — N183 Chronic kidney disease, stage 3 unspecified: Secondary | ICD-10-CM | POA: Diagnosis not present

## 2021-03-23 DIAGNOSIS — E559 Vitamin D deficiency, unspecified: Secondary | ICD-10-CM | POA: Diagnosis not present

## 2021-03-23 DIAGNOSIS — J441 Chronic obstructive pulmonary disease with (acute) exacerbation: Secondary | ICD-10-CM | POA: Diagnosis not present

## 2021-03-23 DIAGNOSIS — J449 Chronic obstructive pulmonary disease, unspecified: Secondary | ICD-10-CM | POA: Diagnosis not present

## 2021-03-23 DIAGNOSIS — M158 Other polyosteoarthritis: Secondary | ICD-10-CM | POA: Diagnosis not present

## 2021-03-23 DIAGNOSIS — D518 Other vitamin B12 deficiency anemias: Secondary | ICD-10-CM | POA: Diagnosis not present

## 2021-03-23 DIAGNOSIS — F330 Major depressive disorder, recurrent, mild: Secondary | ICD-10-CM | POA: Diagnosis not present

## 2021-03-23 DIAGNOSIS — M170 Bilateral primary osteoarthritis of knee: Secondary | ICD-10-CM | POA: Diagnosis not present

## 2021-03-23 DIAGNOSIS — E782 Mixed hyperlipidemia: Secondary | ICD-10-CM | POA: Diagnosis not present

## 2021-03-23 DIAGNOSIS — E1165 Type 2 diabetes mellitus with hyperglycemia: Secondary | ICD-10-CM | POA: Diagnosis not present

## 2021-03-23 DIAGNOSIS — E038 Other specified hypothyroidism: Secondary | ICD-10-CM | POA: Diagnosis not present

## 2021-03-23 DIAGNOSIS — I10 Essential (primary) hypertension: Secondary | ICD-10-CM | POA: Diagnosis not present

## 2021-04-16 ENCOUNTER — Telehealth: Payer: Self-pay | Admitting: Cardiology

## 2021-04-16 NOTE — Telephone Encounter (Signed)
Spoke to the patient just now and let her know that she should be fine to take this medication. She verbalizes understanding.    Encouraged patient to call back with any questions or concerns.

## 2021-04-16 NOTE — Telephone Encounter (Signed)
    Pt c/o medication issue:  1. Name of Medication: polyethylene glycol  2. How are you currently taking this medication (dosage and times per day)?   3. Are you having a reaction (difficulty breathing--STAT)?   4. What is your medication issue? Pt's son said pt has upcoming colonoscopy and wanted to know if pt is ok to take this medication with her heart condition

## 2021-04-19 DIAGNOSIS — E039 Hypothyroidism, unspecified: Secondary | ICD-10-CM | POA: Diagnosis not present

## 2021-04-19 DIAGNOSIS — D6851 Activated protein C resistance: Secondary | ICD-10-CM | POA: Diagnosis not present

## 2021-04-19 DIAGNOSIS — Z1211 Encounter for screening for malignant neoplasm of colon: Secondary | ICD-10-CM | POA: Diagnosis not present

## 2021-04-19 DIAGNOSIS — Z6829 Body mass index (BMI) 29.0-29.9, adult: Secondary | ICD-10-CM | POA: Diagnosis not present

## 2021-04-19 DIAGNOSIS — E669 Obesity, unspecified: Secondary | ICD-10-CM | POA: Diagnosis not present

## 2021-04-19 DIAGNOSIS — K449 Diaphragmatic hernia without obstruction or gangrene: Secondary | ICD-10-CM | POA: Diagnosis not present

## 2021-04-19 DIAGNOSIS — M4320 Fusion of spine, site unspecified: Secondary | ICD-10-CM | POA: Diagnosis not present

## 2021-04-19 DIAGNOSIS — R1115 Cyclical vomiting syndrome unrelated to migraine: Secondary | ICD-10-CM | POA: Diagnosis not present

## 2021-04-19 DIAGNOSIS — I10 Essential (primary) hypertension: Secondary | ICD-10-CM | POA: Diagnosis not present

## 2021-04-19 DIAGNOSIS — Z8673 Personal history of transient ischemic attack (TIA), and cerebral infarction without residual deficits: Secondary | ICD-10-CM | POA: Diagnosis not present

## 2021-04-19 DIAGNOSIS — K635 Polyp of colon: Secondary | ICD-10-CM | POA: Diagnosis not present

## 2021-04-19 DIAGNOSIS — K219 Gastro-esophageal reflux disease without esophagitis: Secondary | ICD-10-CM | POA: Diagnosis not present

## 2021-04-19 DIAGNOSIS — Z7901 Long term (current) use of anticoagulants: Secondary | ICD-10-CM | POA: Diagnosis not present

## 2021-04-19 DIAGNOSIS — I2782 Chronic pulmonary embolism: Secondary | ICD-10-CM | POA: Diagnosis not present

## 2021-04-19 DIAGNOSIS — K315 Obstruction of duodenum: Secondary | ICD-10-CM | POA: Diagnosis not present

## 2021-04-19 DIAGNOSIS — K222 Esophageal obstruction: Secondary | ICD-10-CM | POA: Diagnosis not present

## 2021-04-19 DIAGNOSIS — D123 Benign neoplasm of transverse colon: Secondary | ICD-10-CM | POA: Diagnosis not present

## 2021-04-19 DIAGNOSIS — K5730 Diverticulosis of large intestine without perforation or abscess without bleeding: Secondary | ICD-10-CM | POA: Diagnosis not present

## 2021-04-22 DIAGNOSIS — J441 Chronic obstructive pulmonary disease with (acute) exacerbation: Secondary | ICD-10-CM | POA: Diagnosis not present

## 2021-04-22 DIAGNOSIS — M170 Bilateral primary osteoarthritis of knee: Secondary | ICD-10-CM | POA: Diagnosis not present

## 2021-04-22 DIAGNOSIS — I10 Essential (primary) hypertension: Secondary | ICD-10-CM | POA: Diagnosis not present

## 2021-04-22 DIAGNOSIS — M158 Other polyosteoarthritis: Secondary | ICD-10-CM | POA: Diagnosis not present

## 2021-04-22 DIAGNOSIS — F330 Major depressive disorder, recurrent, mild: Secondary | ICD-10-CM | POA: Diagnosis not present

## 2021-04-22 DIAGNOSIS — E1165 Type 2 diabetes mellitus with hyperglycemia: Secondary | ICD-10-CM | POA: Diagnosis not present

## 2021-04-22 DIAGNOSIS — E559 Vitamin D deficiency, unspecified: Secondary | ICD-10-CM | POA: Diagnosis not present

## 2021-04-22 DIAGNOSIS — J449 Chronic obstructive pulmonary disease, unspecified: Secondary | ICD-10-CM | POA: Diagnosis not present

## 2021-04-22 DIAGNOSIS — N183 Chronic kidney disease, stage 3 unspecified: Secondary | ICD-10-CM | POA: Diagnosis not present

## 2021-04-22 DIAGNOSIS — E038 Other specified hypothyroidism: Secondary | ICD-10-CM | POA: Diagnosis not present

## 2021-04-22 DIAGNOSIS — E782 Mixed hyperlipidemia: Secondary | ICD-10-CM | POA: Diagnosis not present

## 2021-04-22 DIAGNOSIS — D518 Other vitamin B12 deficiency anemias: Secondary | ICD-10-CM | POA: Diagnosis not present

## 2021-05-05 DIAGNOSIS — E782 Mixed hyperlipidemia: Secondary | ICD-10-CM | POA: Diagnosis not present

## 2021-05-05 DIAGNOSIS — I10 Essential (primary) hypertension: Secondary | ICD-10-CM | POA: Diagnosis not present

## 2021-05-05 DIAGNOSIS — K219 Gastro-esophageal reflux disease without esophagitis: Secondary | ICD-10-CM | POA: Diagnosis not present

## 2021-05-05 DIAGNOSIS — F330 Major depressive disorder, recurrent, mild: Secondary | ICD-10-CM | POA: Diagnosis not present

## 2021-05-05 DIAGNOSIS — Z5181 Encounter for therapeutic drug level monitoring: Secondary | ICD-10-CM | POA: Diagnosis not present

## 2021-05-05 DIAGNOSIS — M170 Bilateral primary osteoarthritis of knee: Secondary | ICD-10-CM | POA: Diagnosis not present

## 2021-05-25 DIAGNOSIS — E038 Other specified hypothyroidism: Secondary | ICD-10-CM | POA: Diagnosis not present

## 2021-05-25 DIAGNOSIS — M158 Other polyosteoarthritis: Secondary | ICD-10-CM | POA: Diagnosis not present

## 2021-05-25 DIAGNOSIS — E1165 Type 2 diabetes mellitus with hyperglycemia: Secondary | ICD-10-CM | POA: Diagnosis not present

## 2021-05-25 DIAGNOSIS — M170 Bilateral primary osteoarthritis of knee: Secondary | ICD-10-CM | POA: Diagnosis not present

## 2021-05-25 DIAGNOSIS — E559 Vitamin D deficiency, unspecified: Secondary | ICD-10-CM | POA: Diagnosis not present

## 2021-05-25 DIAGNOSIS — D518 Other vitamin B12 deficiency anemias: Secondary | ICD-10-CM | POA: Diagnosis not present

## 2021-05-25 DIAGNOSIS — N183 Chronic kidney disease, stage 3 unspecified: Secondary | ICD-10-CM | POA: Diagnosis not present

## 2021-05-25 DIAGNOSIS — F330 Major depressive disorder, recurrent, mild: Secondary | ICD-10-CM | POA: Diagnosis not present

## 2021-05-25 DIAGNOSIS — J449 Chronic obstructive pulmonary disease, unspecified: Secondary | ICD-10-CM | POA: Diagnosis not present

## 2021-05-25 DIAGNOSIS — E782 Mixed hyperlipidemia: Secondary | ICD-10-CM | POA: Diagnosis not present

## 2021-05-25 DIAGNOSIS — J441 Chronic obstructive pulmonary disease with (acute) exacerbation: Secondary | ICD-10-CM | POA: Diagnosis not present

## 2021-05-25 DIAGNOSIS — I10 Essential (primary) hypertension: Secondary | ICD-10-CM | POA: Diagnosis not present

## 2021-06-04 DIAGNOSIS — M25561 Pain in right knee: Secondary | ICD-10-CM | POA: Diagnosis not present

## 2021-06-06 NOTE — Progress Notes (Signed)
Cardiology Office Note:    Date:  06/07/2021   ID:  Tina Dominguez, DOB August 25, 1941, MRN [id number]  PCP:  Bonnita Nasuti, MD  Cardiologist:  Shirlee More, MD    Referring MD: Bonnita Nasuti, MD    ASSESSMENT:    1. PAF (paroxysmal atrial fibrillation) ([place])   2. On amiodarone therapy   3. Chronic anticoagulation   4. Coronary artery disease of native artery of native heart with stable angina pectoris ([place])   5. Hypertensive heart disease with heart failure ([place])   6. Mixed hyperlipidemia    PLAN:    In order of problems listed above:  She continues to do well maintaining sinus rhythm on low-dose amiodarone beta-blocker and anticoagulated.  Await labs from PCP liver and thyroid has not been checked we will order this as an outpatient. Continue anticoagulants stop aspirin Stable CAD no anginal discomfort after surgical revascularization continue medical therapy including beta-blocker and high intensity statin Stable Home blood pressures consistently less than 10/26/1936 and continue current treatment including amlodipine centrally active clonidine and beta-blocker along with diuretic Stable continue her statin await labs we will LDL less than 70   Next appointment: 6 months   Medication Adjustments/Labs and Tests Ordered: Current medicines are reviewed at length with the patient today.  Concerns regarding medicines are outlined above.  No orders of the defined types were placed in this encounter.  No orders of the defined types were placed in this encounter.   Chief Complaint  Patient presents with   Atrial Fibrillation   Anticoagulation    History of Present Illness:    Tina Dominguez is a 80 y.o. female with a hx of paroxysmal atrial fibrillation maintaining sinus rhythm on low-dose amiodarone with chronic anticoagulation, CAD with CABG in March 2020 hypertensive heart disease with heart failure hyperlipidemia and hypercoagulable state with factor V Leiden  mutation last seen 12/10/2020.  Compliance with diet, lifestyle and medications: Yes  She has knee pain after total knee arthroplasty and uses topical Voltaren with She takes both aspirin and anticoagulant we will stop her aspirin At times she has palpitation not severe or sustained these had no edema chest pain shortness of breath or syncope and no bleeding complication of a combined antiplatelet anticoagulant therapy 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[place]) 12/18/2017   Carotid artery occlusion    Carotid stenosis, bilateral 05/26/2011   CKD (chronic kidney disease) stage 3, GFR 30-59 ml/min (HCC) 12/13/2017   Clotting disorder ([place])    Coronary artery disease    Depression    Dysphagia 04/17/2017   Factor V Leiden mutation ([place]) 12/18/2017   GERD (gastroesophageal reflux disease) 05/26/2011   GI bleeding 10/04/2017   Gout 12/13/2017   Hiatal hernia 12/13/2017   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[place]) 04/17/2017   Reflux    Renal failure 12/13/2017   S/P IVC filter 12/13/2017   Schatzki's ring 12/13/2017   Stroke ([place])    X's 4   Valvular heart disease 12/13/2017    Past Surgical History:  Procedure Laterality  Date   ABDOMINAL HYSTERECTOMY  1980   TAH    APPENDECTOMY     BACK SURGERY  06/2011   BREAST SURGERY     x2   CAROTID ENDARTERECTOMY  11/11/2010   Right CEA   CORONARY ARTERY BYPASS GRAFT N/A 12/11/2018   Procedure: CORONARY ARTERY BYPASS GRAFTING (CABG) x three , using left internal mammary artery, and right leg greater saphenous vein harvested endoscopically - SVG to OM1, SVG to Distal RCA;  Surgeon: Grace Isaac, MD;  Location: [facility];  Service: Open Heart Surgery;  Laterality: N/A;   IVC FILTER INSERTION     JOINT REPLACEMENT Right 2005   Right Total Knee   JOINT REPLACEMENT Left 2005   Left Total Knee   LEFT HEART CATH AND CORONARY ANGIOGRAPHY N/A 12/10/2018   Procedure: LEFT HEART CATH AND CORONARY ANGIOGRAPHY;  Surgeon: Jettie Booze, MD;  Location: [facility];  Service: Cardiovascular;  Laterality: N/A;   SHOULDER SURGERY     right   SPINE SURGERY  07-11-11   Decomp. Laminectomy, fusion of L4-5,L5-S1 by Dr. Saintclair Halsted   TEE WITHOUT CARDIOVERSION N/A 12/11/2018   Procedure: TRANSESOPHAGEAL ECHOCARDIOGRAM (TEE);  Surgeon: Grace Isaac, MD;  Location: [facility];  Service: Open Heart Surgery;  Laterality: N/A;   TOTAL KNEE ARTHROPLASTY     right  and left    Current Medications: Current Meds  Medication Sig   acetaminophen (TYLENOL) 500 MG tablet Take 500-1,000 mg by mouth every 6 (six) hours as needed for moderate pain.    amiodarone (PACERONE) 200 MG tablet TAKE 1 TABLET(200 MG) BY MOUTH DAILY   amLODipine (NORVASC) 5 MG tablet Take 1 tablet (5 mg total) by mouth daily.   aspirin EC 81 MG EC tablet Take 1 tablet (81 mg total) by mouth daily.   atorvastatin (LIPITOR) 40 MG tablet TAKE 1 TABLET BY MOUTH EVERY DAY AT 6 PM   citalopram (CELEXA) 10 MG tablet Take 10 mg by mouth at bedtime. Take with 40 mg to equal 50 mg at bedtime   citalopram (CELEXA) 40 MG tablet Take 40 mg by mouth daily. Take with 10 mg to equal 50 mg at bedtime   cloNIDine (CATAPRES) 0.1 MG  tablet Take 1 tablet (0.1 mg total) by mouth at bedtime. Take 1 extra tablet daily if your systolic BP (top number) is greater than 180 and if your heart rate is greater than 100.   diclofenac Sodium (VOLTAREN) 1 % GEL Apply 2 g topically 4 (four) times daily.   furosemide (LASIX) 20 MG tablet Take 20 mg by mouth daily.   levothyroxine (SYNTHROID) 100 MCG tablet Take 100 mcg by mouth daily.   linaclotide (LINZESS) 290 MCG CAPS capsule Take 290 mcg by mouth daily.   methylPREDNISolone (MEDROL DOSEPAK) 4 MG TBPK tablet 4 mg See admin instructions. follow package directions   metoprolol tartrate (LOPRESSOR) 25 MG tablet Metoprolol tartrate 12.5 mg (half tablet) in the morning daily. Hold for heart rate <100.   mirtazapine (REMERON) 30 MG tablet Take 30 mg by mouth at bedtime.    Multiple Vitamin (MULTIVITAMIN) tablet  Take 1 tablet by mouth at bedtime.    Oxycodone HCl 10 MG TABS Take 10 mg by mouth every 6 (six) hours as needed (pain).    pantoprazole (PROTONIX) 40 MG tablet TAKE 1 TABLET(40 MG) BY MOUTH TWICE DAILY   potassium chloride SA (K-DUR) 20 MEQ tablet Take 1 Tab (47mq) every other day with lasix   XARELTO 20 MG TABS tablet Take 20 mg by mouth daily.     Allergies:   Patient has no known allergies.   Social History   Socioeconomic History   Marital status: Widowed    Spouse name: Not on file   Number of children: Not on file   Years of education: Not on file   Highest education level: Not on file  Occupational History   Not on file  Tobacco Use   Smoking status: Never   Smokeless tobacco: Never  Vaping Use   Vaping Use: Never used  Substance and Sexual Activity   Alcohol use: No   Drug use: No   Sexual activity: Not on file  Other Topics Concern   Not on file  Social History Narrative   Not on file   Social Determinants of Health   Financial Resource Strain: Not on file  Food Insecurity: Not on file  Transportation Needs: Not on file  Physical Activity: Not on  file  Stress: Not on file  Social Connections: Not on file     Family History: The patient's family history includes Clotting disorder in her mother; Deep vein thrombosis in her mother and sister; Diabetes in her mother and sister; Heart disease in her brother and mother; Heart disease (age of onset: 622 in her father; Hyperlipidemia in her mother; Hypertension in her mother; Stroke in her maternal grandmother; Varicose Veins in her father. ROS:   Please see the history of present illness.    All other systems reviewed and are negative.  EKGs/Labs/Other Studies Reviewed:    The following studies were reviewed today:  EKG:  EKG ordered today and personally reviewed.  The ekg ordered today demonstrates sinus bradycardia 58 bpm LVH otherwise normal  Recent Labs: Requested from her PCP if not done I will order thyroid and liver test   Physical Exam:    VS:  BP (!) 144/70 (BP Location: Right Arm, Patient Position: Sitting)   Pulse (!) 58   Ht '5\' 3"'$  (1.6 m)   Wt 157 lb 9.6 oz (71.5 kg)   SpO2 (!) 9%   BMI 27.92 kg/m     Wt Readings from Last 3 Encounters:  06/07/21 157 lb 9.6 oz (71.5 kg)  12/10/20 161 lb 3.2 oz (73.1 kg)  06/11/20 169 lb 12.8 oz (77 kg)     GEN:  Well nourished, well developed in no acute distress HEENT: Normal NECK: No JVD; No carotid bruits LYMPHATICS: No lymphadenopathy CARDIAC: RRR, no murmurs, rubs, gallops RESPIRATORY:  Clear to auscultation without rales, wheezing or rhonchi  ABDOMEN: Soft, non-tender, non-distended MUSCULOSKELETAL:  No edema; No deformity  SKIN: Warm and dry NEUROLOGIC:  Alert and oriented x 3 PSYCHIATRIC:  Normal affect    Signed, BShirlee More MD  06/07/2021 1:39 PM    [facility]

## 2021-06-07 ENCOUNTER — Other Ambulatory Visit: Payer: Self-pay

## 2021-06-07 ENCOUNTER — Ambulatory Visit: Payer: PPO | Admitting: Cardiology

## 2021-06-07 ENCOUNTER — Encounter: Payer: Self-pay | Admitting: Cardiology

## 2021-06-07 VITALS — BP 144/70 | HR 58 | Ht 63.0 in | Wt 157.6 lb

## 2021-06-07 DIAGNOSIS — E782 Mixed hyperlipidemia: Secondary | ICD-10-CM | POA: Diagnosis not present

## 2021-06-07 DIAGNOSIS — I110 Hypertensive heart disease with heart failure: Secondary | ICD-10-CM

## 2021-06-07 DIAGNOSIS — I480 Paroxysmal atrial fibrillation: Secondary | ICD-10-CM | POA: Diagnosis not present

## 2021-06-07 DIAGNOSIS — I25118 Atherosclerotic heart disease of native coronary artery with other forms of angina pectoris: Secondary | ICD-10-CM

## 2021-06-07 DIAGNOSIS — Z79899 Other long term (current) drug therapy: Secondary | ICD-10-CM

## 2021-06-07 DIAGNOSIS — Z7901 Long term (current) use of anticoagulants: Secondary | ICD-10-CM

## 2021-06-07 NOTE — Patient Instructions (Signed)
Medication Instructions:  Your physician has recommended you make the following change in your medication:  STOP: Aspirin *If you need a refill on your cardiac medications before your next appointment, please call your pharmacy*   Lab Work: None If you have labs (blood work) drawn today and your tests are completely normal, you will receive your results only by: [place] (if you have MyChart) OR A paper copy in the mail If you have any lab test that is abnormal or we need to change your treatment, we will call you to review the results.   Testing/Procedures: None   Follow-Up: At [facility], you and your health needs are our priority.  As part of our continuing mission to provide you with exceptional heart care, we have created designated Provider Care Teams.  These Care Teams include your primary Cardiologist (physician) and Advanced Practice Providers (APPs -  Physician Assistants and Nurse Practitioners) who all work together to provide you with the care you need, when you need it.  We recommend signing up for the patient portal called "MyChart".  Sign up information is provided on this After Visit Summary.  MyChart is used to connect with patients for Virtual Visits (Telemedicine).  Patients are able to view lab/test results, encounter notes, upcoming appointments, etc.  Non-urgent messages can be sent to your provider as well.   To learn more about what you can do with MyChart, go to [email or web address].    Your next appointment:   6 month(s)  The format for your next appointment:   In Person  Provider:   Shirlee More, MD   Other Instructions

## 2021-06-22 DIAGNOSIS — I10 Essential (primary) hypertension: Secondary | ICD-10-CM | POA: Diagnosis not present

## 2021-06-22 DIAGNOSIS — N183 Chronic kidney disease, stage 3 unspecified: Secondary | ICD-10-CM | POA: Diagnosis not present

## 2021-06-22 DIAGNOSIS — D518 Other vitamin B12 deficiency anemias: Secondary | ICD-10-CM | POA: Diagnosis not present

## 2021-06-22 DIAGNOSIS — E782 Mixed hyperlipidemia: Secondary | ICD-10-CM | POA: Diagnosis not present

## 2021-06-22 DIAGNOSIS — E559 Vitamin D deficiency, unspecified: Secondary | ICD-10-CM | POA: Diagnosis not present

## 2021-06-22 DIAGNOSIS — E038 Other specified hypothyroidism: Secondary | ICD-10-CM | POA: Diagnosis not present

## 2021-06-22 DIAGNOSIS — E1165 Type 2 diabetes mellitus with hyperglycemia: Secondary | ICD-10-CM | POA: Diagnosis not present

## 2021-06-22 DIAGNOSIS — J441 Chronic obstructive pulmonary disease with (acute) exacerbation: Secondary | ICD-10-CM | POA: Diagnosis not present

## 2021-06-22 DIAGNOSIS — M158 Other polyosteoarthritis: Secondary | ICD-10-CM | POA: Diagnosis not present

## 2021-06-22 DIAGNOSIS — M170 Bilateral primary osteoarthritis of knee: Secondary | ICD-10-CM | POA: Diagnosis not present

## 2021-06-22 DIAGNOSIS — J449 Chronic obstructive pulmonary disease, unspecified: Secondary | ICD-10-CM | POA: Diagnosis not present

## 2021-06-22 DIAGNOSIS — F330 Major depressive disorder, recurrent, mild: Secondary | ICD-10-CM | POA: Diagnosis not present

## 2021-06-30 ENCOUNTER — Other Ambulatory Visit (HOSPITAL_COMMUNITY): Payer: Self-pay | Admitting: Orthopedic Surgery

## 2021-06-30 ENCOUNTER — Other Ambulatory Visit: Payer: Self-pay | Admitting: Orthopedic Surgery

## 2021-06-30 DIAGNOSIS — G8929 Other chronic pain: Secondary | ICD-10-CM

## 2021-06-30 DIAGNOSIS — M25561 Pain in right knee: Secondary | ICD-10-CM

## 2021-07-12 ENCOUNTER — Encounter (HOSPITAL_COMMUNITY)
Admission: RE | Admit: 2021-07-12 | Discharge: 2021-07-12 | Disposition: A | Discharge status: Home / Self Care | Payer: PPO | Source: Ambulatory Visit | Attending: Orthopedic Surgery | Admitting: Orthopedic Surgery

## 2021-07-12 ENCOUNTER — Other Ambulatory Visit: Payer: Self-pay

## 2021-07-12 DIAGNOSIS — M25561 Pain in right knee: Secondary | ICD-10-CM | POA: Diagnosis not present

## 2021-07-12 DIAGNOSIS — G8929 Other chronic pain: Secondary | ICD-10-CM | POA: Diagnosis not present

## 2021-07-12 DIAGNOSIS — M7989 Other specified soft tissue disorders: Secondary | ICD-10-CM | POA: Diagnosis not present

## 2021-07-12 MED ORDER — TECHNETIUM TC 99M MEDRONATE IV KIT
20.0000 | PACK | Freq: Once | INTRAVENOUS | Status: AC | PRN
  Administered 2021-07-12: 20 via INTRAVENOUS

## 2021-07-20 DIAGNOSIS — M25561 Pain in right knee: Secondary | ICD-10-CM | POA: Diagnosis not present

## 2021-07-26 DIAGNOSIS — J441 Chronic obstructive pulmonary disease with (acute) exacerbation: Secondary | ICD-10-CM | POA: Diagnosis not present

## 2021-07-26 DIAGNOSIS — E782 Mixed hyperlipidemia: Secondary | ICD-10-CM | POA: Diagnosis not present

## 2021-07-26 DIAGNOSIS — E559 Vitamin D deficiency, unspecified: Secondary | ICD-10-CM | POA: Diagnosis not present

## 2021-07-26 DIAGNOSIS — D518 Other vitamin B12 deficiency anemias: Secondary | ICD-10-CM | POA: Diagnosis not present

## 2021-07-26 DIAGNOSIS — F330 Major depressive disorder, recurrent, mild: Secondary | ICD-10-CM | POA: Diagnosis not present

## 2021-07-26 DIAGNOSIS — M170 Bilateral primary osteoarthritis of knee: Secondary | ICD-10-CM | POA: Diagnosis not present

## 2021-07-26 DIAGNOSIS — I10 Essential (primary) hypertension: Secondary | ICD-10-CM | POA: Diagnosis not present

## 2021-07-26 DIAGNOSIS — E038 Other specified hypothyroidism: Secondary | ICD-10-CM | POA: Diagnosis not present

## 2021-07-26 DIAGNOSIS — J449 Chronic obstructive pulmonary disease, unspecified: Secondary | ICD-10-CM | POA: Diagnosis not present

## 2021-07-26 DIAGNOSIS — E1165 Type 2 diabetes mellitus with hyperglycemia: Secondary | ICD-10-CM | POA: Diagnosis not present

## 2021-07-26 DIAGNOSIS — N183 Chronic kidney disease, stage 3 unspecified: Secondary | ICD-10-CM | POA: Diagnosis not present

## 2021-07-26 DIAGNOSIS — M158 Other polyosteoarthritis: Secondary | ICD-10-CM | POA: Diagnosis not present

## 2021-07-27 ENCOUNTER — Telehealth: Payer: Self-pay

## 2021-07-27 NOTE — Telephone Encounter (Signed)
   [facility] Pre-operative Risk Assessment    Patient Name: Tina Dominguez  DOB: 08-18-1941 MRN: [id number]  HEARTCARE STAFF:  - IMPORTANT!!!!!! Under Visit Info/Reason for Call, type in Other and utilize the format Clearance MM/DD/YY or Clearance TBD. Do not use dashes or single digits. - Please review there is not already an duplicate clearance open for this procedure. - If request is for dental extraction, please clarify the # of teeth to be extracted. - If the patient is currently at the dentist's office, call Pre-Op Callback Staff (MA/nurse) to input urgent request.  - If the patient is not currently in the dentist office, please route to the Pre-Op pool.  Request for surgical clearance:  What type of surgery is being performed? 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   When is this surgery scheduled? TBD  What type of clearance is required (medical clearance vs. Pharmacy clearance to hold med vs. Both)? Both  Are there any medications that need to be held prior to surgery and how long? None noted  Practice name and name of physician performing surgery? [facility]. Dr. Zachery Dakins.   What is the office phone number? [place] [phone])   7.   What is the office fax number? [phone]  8.   Anesthesia type (None, local, MAC, general) ? General   Gita Kudo 07/27/2021, 8:16 AM  _________________________________________________________________   (provider comments below)

## 2021-07-29 NOTE — Telephone Encounter (Addendum)
Patient with diagnosis of A Fib on Xarelto for anticoagulation.  Patient also has Factor V Leiden mutation.  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  Date of procedure: TBD   CHA2DS2-VASc Score = 8  This indicates a 10.8% annual risk of stroke. The patient's score is based upon: CHF History: 1 HTN History: 1 Diabetes History: 0 Stroke History: 2 Vascular Disease History: 1 Age Score: 2 Gender Score: 1   Has not had lab work updated in over a year  Patient will be extremely high risk off anticoagulation for even a short period of time due to history of multiple strokes and Factor V Leiden.    If patient needs to hold Xarelto for procedure, recommend Lovenox bridge but will route to Dr Bettina Gavia for input

## 2021-08-02 NOTE — Telephone Encounter (Signed)
   Name: Tina Dominguez  DOB: 1940/12/01  MRN: [id number]   Primary Cardiologist: Shirlee More, MD  Chart reviewed as part of pre-operative protocol coverage. Patient was contacted 08/02/2021 in reference to pre-operative risk assessment for pending surgery as outlined below.  Tina Dominguez was last seen on 06/07/2021 by Dr. Bettina Gavia.  Since that day, Tina Dominguez has done well without exertional chest pain or worsening shortness of breath.  She walks around with a walker.  She has not had any further ischemic work-up since her bypass surgery in March 2022.  Overall, she is at usual state of health.  Her RCRI risk score is class III which equates to about 6.6% risk of major cardiac event.  Therefore, based on ACC/AHA guidelines, the patient would be at acceptable risk for the planned procedure without further cardiovascular testing.   Patient will hold Xarelto for 3 days prior to the surgery and restart as soon as possible afterward at the surgeon's discretion.  She is aware that she will need annual repeat blood work include CBC and basic metabolic panel.  She is due for blood work and her nephrologist office.  The patient was advised that if she develops new symptoms prior to surgery to contact our office to arrange for a follow-up visit, and she verbalized understanding.  I will route this recommendation to the requesting party via Epic fax function and remove from pre-op pool. Please call with questions.  [place], [place] 08/02/2021, 2:45 PM

## 2021-08-02 NOTE — Telephone Encounter (Signed)
Ok to hold Xarelto for 3 days before procedure without Lovenox bridge per Dr Bettina Gavia. Pt should resume Xarelto as soon as safely possible after. Also requires updated BMET and CBC.

## 2021-08-03 DIAGNOSIS — Z23 Encounter for immunization: Secondary | ICD-10-CM | POA: Diagnosis not present

## 2021-08-03 DIAGNOSIS — R3129 Other microscopic hematuria: Secondary | ICD-10-CM | POA: Diagnosis not present

## 2021-08-04 DIAGNOSIS — Z5181 Encounter for therapeutic drug level monitoring: Secondary | ICD-10-CM | POA: Diagnosis not present

## 2021-08-04 DIAGNOSIS — M170 Bilateral primary osteoarthritis of knee: Secondary | ICD-10-CM | POA: Diagnosis not present

## 2021-08-09 DIAGNOSIS — I129 Hypertensive chronic kidney disease with stage 1 through stage 4 chronic kidney disease, or unspecified chronic kidney disease: Secondary | ICD-10-CM | POA: Diagnosis not present

## 2021-08-09 DIAGNOSIS — N183 Chronic kidney disease, stage 3 unspecified: Secondary | ICD-10-CM | POA: Diagnosis not present

## 2021-08-09 DIAGNOSIS — N1831 Chronic kidney disease, stage 3a: Secondary | ICD-10-CM | POA: Diagnosis not present

## 2021-08-13 DIAGNOSIS — I2699 Other pulmonary embolism without acute cor pulmonale: Secondary | ICD-10-CM | POA: Diagnosis not present

## 2021-08-13 DIAGNOSIS — R0600 Dyspnea, unspecified: Secondary | ICD-10-CM | POA: Diagnosis not present

## 2021-08-13 DIAGNOSIS — G4733 Obstructive sleep apnea (adult) (pediatric): Secondary | ICD-10-CM | POA: Diagnosis not present

## 2021-08-20 NOTE — Addendum Note (Signed)
Encounter addended by: Tania Ade on: 08/20/2021 1:05 PM  Actions taken: Imaging Exam ended

## 2021-08-25 DIAGNOSIS — F330 Major depressive disorder, recurrent, mild: Secondary | ICD-10-CM | POA: Diagnosis not present

## 2021-08-25 DIAGNOSIS — I10 Essential (primary) hypertension: Secondary | ICD-10-CM | POA: Diagnosis not present

## 2021-08-25 DIAGNOSIS — J449 Chronic obstructive pulmonary disease, unspecified: Secondary | ICD-10-CM | POA: Diagnosis not present

## 2021-08-25 DIAGNOSIS — N183 Chronic kidney disease, stage 3 unspecified: Secondary | ICD-10-CM | POA: Diagnosis not present

## 2021-08-25 DIAGNOSIS — M158 Other polyosteoarthritis: Secondary | ICD-10-CM | POA: Diagnosis not present

## 2021-08-25 DIAGNOSIS — E782 Mixed hyperlipidemia: Secondary | ICD-10-CM | POA: Diagnosis not present

## 2021-08-25 DIAGNOSIS — E038 Other specified hypothyroidism: Secondary | ICD-10-CM | POA: Diagnosis not present

## 2021-08-25 DIAGNOSIS — E559 Vitamin D deficiency, unspecified: Secondary | ICD-10-CM | POA: Diagnosis not present

## 2021-08-25 DIAGNOSIS — E1165 Type 2 diabetes mellitus with hyperglycemia: Secondary | ICD-10-CM | POA: Diagnosis not present

## 2021-08-25 DIAGNOSIS — D518 Other vitamin B12 deficiency anemias: Secondary | ICD-10-CM | POA: Diagnosis not present

## 2021-08-25 DIAGNOSIS — J441 Chronic obstructive pulmonary disease with (acute) exacerbation: Secondary | ICD-10-CM | POA: Diagnosis not present

## 2021-08-25 DIAGNOSIS — M170 Bilateral primary osteoarthritis of knee: Secondary | ICD-10-CM | POA: Diagnosis not present

## 2021-08-26 DIAGNOSIS — G4733 Obstructive sleep apnea (adult) (pediatric): Secondary | ICD-10-CM | POA: Diagnosis not present

## 2021-09-01 DIAGNOSIS — R0600 Dyspnea, unspecified: Secondary | ICD-10-CM | POA: Diagnosis not present

## 2021-09-01 DIAGNOSIS — G4733 Obstructive sleep apnea (adult) (pediatric): Secondary | ICD-10-CM | POA: Diagnosis not present

## 2021-09-01 DIAGNOSIS — I2699 Other pulmonary embolism without acute cor pulmonale: Secondary | ICD-10-CM | POA: Diagnosis not present

## 2021-10-13 NOTE — Progress Notes (Signed)
Sent message, via epic in basket, requesting orders in epic from surgeon.  

## 2021-10-14 ENCOUNTER — Inpatient Hospital Stay (HOSPITAL_COMMUNITY): Payer: PPO

## 2021-10-14 ENCOUNTER — Emergency Department (HOSPITAL_COMMUNITY): Payer: PPO

## 2021-10-14 ENCOUNTER — Inpatient Hospital Stay (HOSPITAL_COMMUNITY)
Admission: EM | Admit: 2021-10-14 | Discharge: 2021-10-16 | DRG: 439 | Disposition: A | Discharge status: Home / Self Care | Payer: PPO | Attending: Internal Medicine | Admitting: Internal Medicine

## 2021-10-14 ENCOUNTER — Ambulatory Visit: Payer: Self-pay | Admitting: Physician Assistant

## 2021-10-14 ENCOUNTER — Encounter (HOSPITAL_COMMUNITY): Payer: Self-pay

## 2021-10-14 ENCOUNTER — Other Ambulatory Visit: Payer: Self-pay

## 2021-10-14 DIAGNOSIS — Z951 Presence of aortocoronary bypass graft: Secondary | ICD-10-CM

## 2021-10-14 DIAGNOSIS — Z95828 Presence of other vascular implants and grafts: Secondary | ICD-10-CM

## 2021-10-14 DIAGNOSIS — K859 Acute pancreatitis without necrosis or infection, unspecified: Secondary | ICD-10-CM | POA: Diagnosis present

## 2021-10-14 DIAGNOSIS — Z96653 Presence of artificial knee joint, bilateral: Secondary | ICD-10-CM | POA: Diagnosis present

## 2021-10-14 DIAGNOSIS — Z20822 Contact with and (suspected) exposure to covid-19: Secondary | ICD-10-CM | POA: Diagnosis present

## 2021-10-14 DIAGNOSIS — E785 Hyperlipidemia, unspecified: Secondary | ICD-10-CM | POA: Diagnosis present

## 2021-10-14 DIAGNOSIS — I130 Hypertensive heart and chronic kidney disease with heart failure and stage 1 through stage 4 chronic kidney disease, or unspecified chronic kidney disease: Secondary | ICD-10-CM | POA: Diagnosis present

## 2021-10-14 DIAGNOSIS — K449 Diaphragmatic hernia without obstruction or gangrene: Secondary | ICD-10-CM | POA: Diagnosis present

## 2021-10-14 DIAGNOSIS — I480 Paroxysmal atrial fibrillation: Secondary | ICD-10-CM | POA: Diagnosis present

## 2021-10-14 DIAGNOSIS — N183 Chronic kidney disease, stage 3 unspecified: Secondary | ICD-10-CM | POA: Diagnosis present

## 2021-10-14 DIAGNOSIS — Z79890 Hormone replacement therapy (postmenopausal): Secondary | ICD-10-CM

## 2021-10-14 DIAGNOSIS — Z8673 Personal history of transient ischemic attack (TIA), and cerebral infarction without residual deficits: Secondary | ICD-10-CM

## 2021-10-14 DIAGNOSIS — K851 Biliary acute pancreatitis without necrosis or infection: Secondary | ICD-10-CM | POA: Diagnosis present

## 2021-10-14 DIAGNOSIS — D6851 Activated protein C resistance: Secondary | ICD-10-CM | POA: Diagnosis present

## 2021-10-14 DIAGNOSIS — I2510 Atherosclerotic heart disease of native coronary artery without angina pectoris: Secondary | ICD-10-CM | POA: Diagnosis present

## 2021-10-14 DIAGNOSIS — Z79899 Other long term (current) drug therapy: Secondary | ICD-10-CM

## 2021-10-14 DIAGNOSIS — K219 Gastro-esophageal reflux disease without esophagitis: Secondary | ICD-10-CM | POA: Diagnosis present

## 2021-10-14 DIAGNOSIS — Z8249 Family history of ischemic heart disease and other diseases of the circulatory system: Secondary | ICD-10-CM

## 2021-10-14 DIAGNOSIS — Z981 Arthrodesis status: Secondary | ICD-10-CM

## 2021-10-14 DIAGNOSIS — Z86718 Personal history of other venous thrombosis and embolism: Secondary | ICD-10-CM | POA: Diagnosis not present

## 2021-10-14 DIAGNOSIS — I700 Atherosclerosis of aorta: Secondary | ICD-10-CM | POA: Diagnosis present

## 2021-10-14 DIAGNOSIS — Z90710 Acquired absence of both cervix and uterus: Secondary | ICD-10-CM

## 2021-10-14 DIAGNOSIS — K8020 Calculus of gallbladder without cholecystitis without obstruction: Secondary | ICD-10-CM | POA: Diagnosis present

## 2021-10-14 DIAGNOSIS — E039 Hypothyroidism, unspecified: Secondary | ICD-10-CM | POA: Diagnosis present

## 2021-10-14 DIAGNOSIS — Z7901 Long term (current) use of anticoagulants: Secondary | ICD-10-CM

## 2021-10-14 DIAGNOSIS — I447 Left bundle-branch block, unspecified: Secondary | ICD-10-CM | POA: Diagnosis present

## 2021-10-14 DIAGNOSIS — E1122 Type 2 diabetes mellitus with diabetic chronic kidney disease: Secondary | ICD-10-CM | POA: Diagnosis present

## 2021-10-14 DIAGNOSIS — I422 Other hypertrophic cardiomyopathy: Secondary | ICD-10-CM | POA: Diagnosis present

## 2021-10-14 DIAGNOSIS — Z833 Family history of diabetes mellitus: Secondary | ICD-10-CM

## 2021-10-14 DIAGNOSIS — R001 Bradycardia, unspecified: Secondary | ICD-10-CM

## 2021-10-14 DIAGNOSIS — Z832 Family history of diseases of the blood and blood-forming organs and certain disorders involving the immune mechanism: Secondary | ICD-10-CM

## 2021-10-14 DIAGNOSIS — I5032 Chronic diastolic (congestive) heart failure: Secondary | ICD-10-CM | POA: Diagnosis present

## 2021-10-14 DIAGNOSIS — I361 Nonrheumatic tricuspid (valve) insufficiency: Secondary | ICD-10-CM | POA: Diagnosis not present

## 2021-10-14 DIAGNOSIS — Z86711 Personal history of pulmonary embolism: Secondary | ICD-10-CM

## 2021-10-14 HISTORY — DX: Acute pancreatitis without necrosis or infection, unspecified: K85.90

## 2021-10-14 LAB — CBC
HCT: 38 % (ref 36.0–46.0)
Hemoglobin: 12.1 g/dL (ref 12.0–15.0)
MCH: 30 pg (ref 26.0–34.0)
MCHC: 31.8 g/dL (ref 30.0–36.0)
MCV: 94.1 fL (ref 80.0–100.0)
Platelets: 329 10*3/uL (ref 150–400)
RBC: 4.04 MIL/uL (ref 3.87–5.11)
RDW: 13.1 % (ref 11.5–15.5)
WBC: 9.4 10*3/uL (ref 4.0–10.5)
nRBC: 0 % (ref 0.0–0.2)

## 2021-10-14 LAB — BASIC METABOLIC PANEL
Anion gap: 10 (ref 5–15)
BUN: 16 mg/dL (ref 8–23)
CO2: 27 mmol/L (ref 22–32)
Calcium: 9 mg/dL (ref 8.9–10.3)
Chloride: 102 mmol/L (ref 98–111)
Creatinine, Ser: 1.12 mg/dL — ABNORMAL HIGH (ref 0.44–1.00)
GFR, Estimated: 50 mL/min — ABNORMAL LOW (ref >60)
Glucose, Bld: 116 mg/dL — ABNORMAL HIGH (ref 70–99)
Potassium: 3.9 mmol/L (ref 3.5–5.1)
Sodium: 139 mmol/L (ref 135–145)

## 2021-10-14 LAB — URINALYSIS, ROUTINE W REFLEX MICROSCOPIC
Bilirubin Urine: NEGATIVE
Glucose, UA: NEGATIVE mg/dL
Hgb urine dipstick: NEGATIVE
Ketones, ur: NEGATIVE mg/dL
Nitrite: NEGATIVE
Protein, ur: NEGATIVE mg/dL
Specific Gravity, Urine: 1.01 (ref 1.005–1.030)
pH: 5.5 (ref 5.0–8.0)

## 2021-10-14 LAB — LIPID PANEL
Cholesterol: 118 mg/dL (ref 0–200)
HDL: 58 mg/dL (ref >40)
LDL Cholesterol: 41 mg/dL (ref 0–99)
Total CHOL/HDL Ratio: 2 RATIO
Triglycerides: 96 mg/dL (ref <150)
VLDL: 19 mg/dL (ref 0–40)

## 2021-10-14 LAB — URINALYSIS, MICROSCOPIC (REFLEX)

## 2021-10-14 LAB — HEPATIC FUNCTION PANEL
ALT: 9 U/L (ref 0–44)
AST: 21 U/L (ref 15–41)
Albumin: 3.5 g/dL (ref 3.5–5.0)
Alkaline Phosphatase: 81 U/L (ref 38–126)
Bilirubin, Direct: <0.1 mg/dL (ref 0.0–0.2)
Total Bilirubin: 0.6 mg/dL (ref 0.3–1.2)
Total Protein: 6.8 g/dL (ref 6.5–8.1)

## 2021-10-14 LAB — TROPONIN I (HIGH SENSITIVITY)
Troponin I (High Sensitivity): 9 ng/L (ref <18)
Troponin I (High Sensitivity): 17 ng/L (ref <18)

## 2021-10-14 LAB — LIPASE, BLOOD: Lipase: 190 U/L — ABNORMAL HIGH (ref 11–51)

## 2021-10-14 LAB — RESP PANEL BY RT-PCR (FLU A&B, COVID) ARPGX2
Influenza A by PCR: NEGATIVE
Influenza B by PCR: NEGATIVE
SARS Coronavirus 2 by RT PCR: NEGATIVE

## 2021-10-14 LAB — PHOSPHORUS: Phosphorus: 3.4 mg/dL (ref 2.5–4.6)

## 2021-10-14 LAB — MAGNESIUM: Magnesium: 1.4 mg/dL — ABNORMAL LOW (ref 1.7–2.4)

## 2021-10-14 MED ORDER — CITALOPRAM HYDROBROMIDE 40 MG PO TABS
50.0000 mg | ORAL_TABLET | Freq: Every day | ORAL | Status: DC
  Administered 2021-10-14 – 2021-10-15 (×2): 50 mg via ORAL
  Filled 2021-10-14 (×3): qty 1

## 2021-10-14 MED ORDER — FUROSEMIDE 20 MG PO TABS
20.0000 mg | ORAL_TABLET | Freq: Every day | ORAL | Status: DC
  Administered 2021-10-15 – 2021-10-16 (×2): 20 mg via ORAL
  Filled 2021-10-14 (×2): qty 1

## 2021-10-14 MED ORDER — MORPHINE SULFATE (PF) 2 MG/ML IV SOLN
2.0000 mg | Freq: Once | INTRAVENOUS | Status: AC
  Administered 2021-10-14: 2 mg via INTRAVENOUS
  Filled 2021-10-14: qty 1

## 2021-10-14 MED ORDER — LINACLOTIDE 145 MCG PO CAPS: 290.0000 ug | ORAL_CAPSULE | ORAL | Status: DC

## 2021-10-14 MED ORDER — SENNOSIDES-DOCUSATE SODIUM 8.6-50 MG PO TABS: 1.0000 | ORAL_TABLET | Freq: Every evening | ORAL | Status: DC | PRN

## 2021-10-14 MED ORDER — LEVOTHYROXINE SODIUM 100 MCG PO TABS
100.0000 ug | ORAL_TABLET | Freq: Every day | ORAL | Status: DC
Start: 2021-10-14 — End: 2021-10-14

## 2021-10-14 MED ORDER — ONDANSETRON HCL 4 MG/2ML IJ SOLN: 4.0000 mg | Freq: Four times a day (QID) | INTRAMUSCULAR | Status: DC | PRN

## 2021-10-14 MED ORDER — CLONIDINE HCL 0.1 MG PO TABS
0.1000 mg | ORAL_TABLET | Freq: Every day | ORAL | Status: DC
  Administered 2021-10-15: 0.1 mg via ORAL
  Filled 2021-10-14: qty 1

## 2021-10-14 MED ORDER — AMLODIPINE BESYLATE 5 MG PO TABS
5.0000 mg | ORAL_TABLET | Freq: Every day | ORAL | Status: DC
  Administered 2021-10-15 – 2021-10-16 (×2): 5 mg via ORAL
  Filled 2021-10-14 (×2): qty 1

## 2021-10-14 MED ORDER — LEVOTHYROXINE SODIUM 100 MCG PO TABS
100.0000 ug | ORAL_TABLET | Freq: Every day | ORAL | Status: DC
  Administered 2021-10-15 – 2021-10-16 (×2): 100 ug via ORAL
  Filled 2021-10-14 (×2): qty 1

## 2021-10-14 MED ORDER — OXYCODONE HCL 5 MG PO TABS
10.0000 mg | ORAL_TABLET | ORAL | Status: DC | PRN
  Administered 2021-10-14 – 2021-10-16 (×9): 10 mg via ORAL
  Filled 2021-10-14 (×9): qty 2

## 2021-10-14 MED ORDER — OXYCODONE HCL 5 MG PO TABS
5.0000 mg | ORAL_TABLET | Freq: Once | ORAL | Status: AC
Start: 2021-10-14 — End: 2021-10-14
  Administered 2021-10-14: 5 mg via ORAL
  Filled 2021-10-14: qty 1

## 2021-10-14 MED ORDER — MIRTAZAPINE 30 MG PO TABS
30.0000 mg | ORAL_TABLET | Freq: Every day | ORAL | Status: DC
  Administered 2021-10-14 – 2021-10-15 (×2): 30 mg via ORAL
  Filled 2021-10-14 (×3): qty 1

## 2021-10-14 MED ORDER — AMIODARONE HCL 200 MG PO TABS
200.0000 mg | ORAL_TABLET | Freq: Every day | ORAL | Status: DC
  Filled 2021-10-14: qty 1

## 2021-10-14 MED ORDER — ACETAMINOPHEN 325 MG PO TABS
650.0000 mg | ORAL_TABLET | Freq: Four times a day (QID) | ORAL | Status: DC | PRN
  Administered 2021-10-15: 650 mg via ORAL
  Filled 2021-10-14: qty 2

## 2021-10-14 MED ORDER — METOPROLOL TARTRATE 12.5 MG HALF TABLET
12.5000 mg | ORAL_TABLET | Freq: Two times a day (BID) | ORAL | Status: DC
  Administered 2021-10-14: 12.5 mg via ORAL
  Filled 2021-10-14 (×2): qty 1

## 2021-10-14 MED ORDER — GADOBUTROL 1 MMOL/ML IV SOLN
7.5000 mL | Freq: Once | INTRAVENOUS | Status: AC | PRN
  Administered 2021-10-14: 7.5 mL via INTRAVENOUS

## 2021-10-14 MED ORDER — IOHEXOL 300 MG/ML  SOLN
100.0000 mL | Freq: Once | INTRAMUSCULAR | Status: AC | PRN
  Administered 2021-10-14: 100 mL via INTRAVENOUS

## 2021-10-14 MED ORDER — ONDANSETRON HCL 4 MG/2ML IJ SOLN
4.0000 mg | Freq: Once | INTRAMUSCULAR | Status: AC
  Administered 2021-10-14: 4 mg via INTRAVENOUS
  Filled 2021-10-14: qty 2

## 2021-10-14 MED ORDER — LACTATED RINGERS IV SOLN: INTRAVENOUS | Status: AC

## 2021-10-14 MED ORDER — ACETAMINOPHEN 650 MG RE SUPP: 650.0000 mg | Freq: Four times a day (QID) | RECTAL | Status: DC | PRN

## 2021-10-14 MED ORDER — ATORVASTATIN CALCIUM 40 MG PO TABS
40.0000 mg | ORAL_TABLET | Freq: Every day | ORAL | Status: DC
  Administered 2021-10-15 – 2021-10-16 (×2): 40 mg via ORAL
  Filled 2021-10-14 (×2): qty 1

## 2021-10-14 MED ORDER — FAMOTIDINE 20 MG PO TABS
20.0000 mg | ORAL_TABLET | Freq: Every day | ORAL | Status: DC | PRN
  Administered 2021-10-14: 20 mg via ORAL
  Filled 2021-10-14: qty 1

## 2021-10-14 MED ORDER — RIVAROXABAN 10 MG PO TABS: 20.0000 mg | ORAL_TABLET | Freq: Every day | ORAL | Status: DC

## 2021-10-14 MED ORDER — ONDANSETRON HCL 4 MG PO TABS: 4.0000 mg | ORAL_TABLET | Freq: Four times a day (QID) | ORAL | Status: DC | PRN

## 2021-10-14 MED ORDER — LINACLOTIDE 145 MCG PO CAPS
290.0000 ug | ORAL_CAPSULE | ORAL | Status: DC
  Administered 2021-10-15: 290 ug via ORAL
  Filled 2021-10-14: qty 2

## 2021-10-14 MED ORDER — CITALOPRAM HYDROBROMIDE 10 MG PO TABS: 40.0000 mg | ORAL_TABLET | Freq: Every day | ORAL | Status: DC

## 2021-10-14 MED ORDER — OXYCODONE HCL 5 MG PO TABS
5.0000 mg | ORAL_TABLET | ORAL | Status: DC | PRN
  Administered 2021-10-14: 5 mg via ORAL
  Filled 2021-10-14: qty 1

## 2021-10-14 NOTE — ED Provider Notes (Signed)
[facility] EMERGENCY DEPARTMENT Provider Note   CSN: [id number] Arrival date & time: 10/14/21  9379     History  Chief Complaint  Patient presents with   Chest Pain    Tina Dominguez is a 81 y.o. female.  Pt is a 81 yo wf with a hx of hyperlipidemia, htn, depression, cad, cva, pe, hiatal hernia, gi bleed, gerd, afib, cad, ckd s/p IVC filter and on Xarelto.  She said she woke up this am to urinate and felt nauseous.  She felt pain to her chest and it did not go away, so she called EMS.  It is gone now.  She denies any current n/v or sob.  No f/c. Pt points to her epigastrium when she localizes her cp.      Home Medications Prior to Admission medications   Medication Sig Start Date End Date Taking? Authorizing Provider  acetaminophen (TYLENOL) 500 MG tablet Take 500-1,000 mg by mouth every 6 (six) hours as needed for moderate pain.    Yes [provider]  amiodarone (PACERONE) 200 MG tablet TAKE 1 TABLET(200 MG) BY MOUTH DAILY Patient taking differently: Take 200 mg by mouth daily. 06/16/20  Yes Richardo Priest, MD  amLODipine (NORVASC) 5 MG tablet Take 1 tablet (5 mg total) by mouth daily. 08/06/19  Yes Loel Dubonnet, NP  atorvastatin (LIPITOR) 40 MG tablet TAKE 1 TABLET BY MOUTH EVERY DAY AT 6 PM Patient taking differently: Take 40 mg by mouth daily. 12/28/20  Yes Richardo Priest, MD  citalopram (CELEXA) 10 MG tablet Take 10 mg by mouth at bedtime. Take with 40 mg to equal 50 mg at bedtime   Yes [provider]  citalopram (CELEXA) 40 MG tablet Take 40 mg by mouth daily. Take with 10 mg to equal 50 mg at bedtime 11/22/18  Yes [provider]  cloNIDine (CATAPRES) 0.1 MG tablet Take 1 tablet (0.1 mg total) by mouth at bedtime. Take 1 extra tablet daily if your systolic BP (top number) is greater than 180 and if your heart rate is greater than 100. 01/01/20  Yes Munley, Hilton Cork, MD  famotidine (PEPCID) 20 MG tablet Take 20 mg by mouth  daily as needed for heartburn or indigestion.   Yes [provider]  furosemide (LASIX) 20 MG tablet Take 20 mg by mouth daily.   Yes [provider]  levothyroxine (SYNTHROID) 100 MCG tablet Take 100 mcg by mouth daily. 09/06/19  Yes [provider]  linaclotide (LINZESS) 290 MCG CAPS capsule Take 290 mcg by mouth every other day.   Yes [provider]  metoprolol tartrate (LOPRESSOR) 25 MG tablet Metoprolol tartrate 12.5 mg (half tablet) in the morning daily. Hold for heart rate <100. 09/13/19  Yes Munley, Hilton Cork, MD  mirtazapine (REMERON) 30 MG tablet Take 30 mg by mouth at bedtime.    Yes [provider]  Multiple Vitamin (MULTIVITAMIN) tablet Take 1 tablet by mouth daily.   Yes [provider]  ondansetron (ZOFRAN) 8 MG tablet Take 8 mg by mouth every 8 (eight) hours as needed for nausea/vomiting. 06/30/21  Yes [provider]  Oxycodone HCl 10 MG TABS Take 10 mg by mouth every 6 (six) hours as needed (pain).  11/26/18  Yes [provider]  potassium chloride SA (K-DUR) 20 MEQ tablet Take 1 Tab (33meq) every other day with lasix 04/09/19  Yes Munley, Hilton Cork, MD  XARELTO 20 MG TABS tablet Take 20 mg  by mouth daily. 10/10/17  Yes [provider]  pantoprazole (PROTONIX) 40 MG tablet TAKE 1 TABLET(40 MG) BY MOUTH TWICE DAILY Patient not taking: Reported on 10/14/2021 03/18/19   Richardo Priest, MD      Allergies    Patient has no known allergies.    Review of Systems   Review of Systems  Cardiovascular:  Positive for chest pain.  Gastrointestinal:  Positive for abdominal pain.  All other systems reviewed and are negative.  Physical Exam Updated Vital Signs BP (!) 152/66    Pulse 64    Temp 98.4 F (36.9 C) (Oral)    Resp (!) 21    Ht 5\' 3"  (1.6 m)    Wt 72.6 kg    SpO2 99%    BMI 28.34 kg/m  Physical Exam Vitals and nursing note reviewed.  Constitutional:      Appearance: She is well-developed.  HENT:      Head: Normocephalic and atraumatic.  Eyes:     Extraocular Movements: Extraocular movements intact.     Pupils: Pupils are equal, round, and reactive to light.  Cardiovascular:     Rate and Rhythm: Normal rate and regular rhythm.     Heart sounds: Normal heart sounds.  Pulmonary:     Effort: Pulmonary effort is normal.     Breath sounds: Normal breath sounds.  Abdominal:     General: Bowel sounds are normal.     Palpations: Abdomen is soft.     Tenderness: There is abdominal tenderness in the epigastric area.  Musculoskeletal:        General: Normal range of motion.     Cervical back: Normal range of motion and neck supple.  Skin:    General: Skin is warm.     Capillary Refill: Capillary refill takes less than 2 seconds.  Neurological:     General: No focal deficit present.     Mental Status: She is alert.  Psychiatric:        Mood and Affect: Mood normal.        Behavior: Behavior normal.    ED Results / Procedures / Treatments   Labs (all labs ordered are listed, but only abnormal results are displayed) Labs Reviewed  BASIC METABOLIC PANEL - Abnormal; Notable for the following components:      Result Value   Glucose, Bld 116 (*)    Creatinine, Ser 1.12 (*)    GFR, Estimated 50 (*)    All other components within normal limits  LIPASE, BLOOD - Abnormal; Notable for the following components:   Lipase 190 (*)    All other components within normal limits  URINALYSIS, ROUTINE W REFLEX MICROSCOPIC - Abnormal; Notable for the following components:   Leukocytes,Ua TRACE (*)    All other components within normal limits  URINALYSIS, MICROSCOPIC (REFLEX) - Abnormal; Notable for the following components:   Bacteria, UA RARE (*)    All other components within normal limits  RESP PANEL BY RT-PCR (FLU A&B, COVID) ARPGX2  CBC  HEPATIC FUNCTION PANEL  LIPID PANEL  TROPONIN I (HIGH SENSITIVITY)  TROPONIN I (HIGH SENSITIVITY)    EKG EKG Interpretation  Date/Time:  Thursday  October 14 2021 08:45:18 EST Ventricular Rate:  71 PR Interval:  196 QRS Duration: 129 QT Interval:  447 QTC Calculation: 486 R Axis:   -38 Text Interpretation: Sinus rhythm Probable left atrial enlargement Left bundle branch block now in sinus Confirmed by Isla Pence [phone] on 10/14/2021 9:21:33 AM  Radiology CT ABDOMEN PELVIS W CONTRAST  Result Date: 10/14/2021 CLINICAL DATA:  Lower abdominal pain with nausea and vomiting. EXAM: CT ABDOMEN AND PELVIS WITH CONTRAST TECHNIQUE: Multidetector CT imaging of the abdomen and pelvis was performed using the standard protocol following bolus administration of intravenous contrast. RADIATION DOSE REDUCTION: This exam was performed according to the departmental dose-optimization program which includes automated exposure control, adjustment of the mA and/or kV according to patient size and/or use of iterative reconstruction technique. CONTRAST:  163mL OMNIPAQUE IOHEXOL 300 MG/ML  SOLN COMPARISON:  CT abdomen pelvis dated September 16, 2017. FINDINGS: Lower chest: No acute abnormality.  Minimal bibasilar atelectasis. Hepatobiliary: No focal liver abnormality. Multiple tiny gallstones again noted. No gallbladder wall thickening. Mild central intrahepatic and common bile duct dilatation to the ampulla. Pancreas: Mild heterogeneity of the pancreatic head with surrounding inflammatory change and fluid. No ductal dilatation. Spleen: Normal in size without focal abnormality. Adrenals/Urinary Tract: Adrenal glands are unremarkable. Unchanged 1.0 cm simple cyst in the left kidney. Other subcentimeter low-density lesions in both kidneys remain too small to characterize. No renal calculi. Mild right hydroureteronephrosis. The bladder is unremarkable. Stomach/Bowel: Unchanged moderate hiatal hernia. The stomach is otherwise within normal limits. No bowel wall thickening, distention, or surrounding inflammatory changes. Sigmoid colonic diverticulosis. History of prior  appendectomy. Vascular/Lymphatic: Aortoiliac atherosclerotic vascular disease. 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 Unchanged IVC filter. No enlarged abdominal or pelvic lymph nodes. Reproductive: Status post hysterectomy. No adnexal masses. Other: No abdominal wall hernia or abnormality. No abdominopelvic ascites. Musculoskeletal: No acute or significant osseous findings. Prior lumbosacral fusion. Chronic lower thoracic compression deformities. IMPRESSION: 1. Mild heterogeneity of the pancreatic head with surrounding inflammatory change and fluid, consistent with acute pancreatitis. No peripancreatic fluid collection. 2. Mild central intrahepatic and common bile duct dilatation to the ampulla without obstructing lesion. This may be reactive to underlying pancreatitis. Correlate with LFTs. 3. Mild right hydroureteronephrosis without obstructing calculus. 4. 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 5. Unchanged cholelithiasis. 6. Aortic Atherosclerosis (ICD10-I70.0). Electronically Signed   By: Titus Dubin M.D.   On: 10/14/2021 11:12   DG Chest Port 1 View  Result Date: 10/14/2021 CLINICAL DATA:  Chest pain EXAM: PORTABLE CHEST 1 VIEW COMPARISON:  06/12/2020 FINDINGS: The heart size and mediastinal contours are within normal limits for technique. Both lungs are clear. No pleural effusion or pneumothorax. The visualized skeletal structures are unremarkable. IMPRESSION: No acute process in the chest. Electronically Signed   By: Macy Mis M.D.   On: 10/14/2021 09:08    Procedures Procedures    Medications Ordered in ED Medications  iohexol (OMNIPAQUE) 300 MG/ML solution 100 mL (100 mLs Intravenous Contrast Given 10/14/21 1050)    ED Course/ Medical Decision Making/ A&P                           Medical Decision Making Amount and/or Complexity of Data Reviewed Labs: ordered. Radiology: ordered.  Risk Prescription  drug management. Decision regarding hospitalization.   Labs ordered and reviewed.  Cardiac eval unremarkable.  EKG and troponin ok.  Lipase is elevated.  Due to the possibility for pancreatitis, CT was ordered.  This did show acute pancreatitis.  There was dilation of the cbd, but the radiologist did not see an obstructive stone.  Bilirubin nl.  Due to the possibility of a stone in the cbd, I ordered a MRCP.  Pt has  never had pancreatitis.  She is not a drinker.  Lipid panel ordered in case her TGs are elevated.  Pt d/w IMTS for admission.  Plan d/w patient and her son.        Final Clinical Impression(s) / ED Diagnoses Final diagnoses:  Acute pancreatitis, unspecified complication status, unspecified pancreatitis type  Calculus of gallbladder without cholecystitis without obstruction    Rx / DC Orders ED Discharge Orders     None         Isla Pence, MD 10/14/21 1218

## 2021-10-14 NOTE — ED Notes (Signed)
Resting quietly. No signs of distress. No complaints of chest pain, no complaints of shortness of breath. VSS. Son at bedside.

## 2021-10-14 NOTE — ED Notes (Signed)
Patient transported to MRI 

## 2021-10-14 NOTE — H&P (Signed)
TOTAL KNEE REVISION ADMISSION H&P  Patient is being admitted for right revision total knee arthroplasty.  Subjective:  Chief Complaint:right knee pain.  HPI: Tina Dominguez, 81 y.o. female, has a history of pain and functional disability in the right knee(s) due to failed previous arthroplasty and patient has failed non-surgical conservative treatments for greater than 12 weeks to include NSAID's and/or analgesics, use of assistive devices, and activity modification. The indications for the revision of the total knee arthroplasty are loosening of one or more components. Onset of symptoms was gradual starting 1 years ago with rapidlly worsening course since that time.  Prior procedures on the right knee(s) include arthroplasty.  Patient currently rates pain in the right knee(s) at 9 out of 10 with activity. There is night pain, worsening of pain with activity and weight bearing, pain that interferes with activities of daily living, pain with passive range of motion, and joint swelling.  Patient has evidence of prosthetic loosening by imaging studies. This condition presents safety issues increasing the risk of falls.  There is no current active infection.  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   S/P CABG x 3 12/11/2018   Coronary artery disease 12/11/2018   CAD (coronary artery disease) 12/10/2018   Abnormal myocardial perfusion study    PAT (paroxysmal atrial tachycardia) ([place]) 12/05/2018   Pre-op evaluation 12/05/2018   Hyperkalemia 01/19/2018   Chronic anticoagulation 01/19/2018   Chest pain in adult 12/18/2017   Factor V Leiden mutation ([place]) 12/18/2017   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[place]) 12/18/2017   History of recurrent TIAs 12/13/2017   S/P IVC filter 12/13/2017   Schatzki's ring  12/13/2017   Hiatal hernia 12/13/2017   Valvular heart disease 12/13/2017   CKD (chronic kidney disease) stage 3, GFR 30-59 ml/min (HCC) 12/13/2017   Gout 12/13/2017   Anxiety 12/13/2017   Anemia 12/13/2017   Renal failure 12/13/2017   GI bleeding 10/04/2017   Dysphagia 04/17/2017   History of DVT (deep vein thrombosis) 04/17/2017   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   Aftercare following surgery of the circulatory system, NEC 10/14/2013   Occlusion and stenosis of carotid artery without mention of cerebral infarction 10/08/2012   History of right-sided carotid endarterectomy 10/10/2011   Hyperlipidemia 05/26/2011   Stroke ([place]) 05/26/2011   Arthritis 05/26/2011   Depression 05/26/2011   Osteoarthritis 05/26/2011   Carotid stenosis, bilateral 05/26/2011   GERD (gastroesophageal reflux disease) 05/26/2011   PE (pulmonary embolism) 05/26/2011   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[place]) 12/18/2017   Carotid artery occlusion    Carotid stenosis, bilateral 05/26/2011   CKD (chronic kidney disease) stage 3, GFR 30-59 ml/min (HCC) 12/13/2017   Clotting disorder ([place])    Coronary artery disease    Depression    Dysphagia 04/17/2017   Factor V Leiden mutation ([place]) 12/18/2017   GERD (gastroesophageal reflux disease) 05/26/2011   GI bleeding 10/04/2017   Gout 12/13/2017   Hiatal hernia 12/13/2017   History of DVT (deep vein thrombosis) 04/17/2017   Overview:  Multiple dvts.  Thought to  be due to Factor V, but genetic testing negative.    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/17/2017   Reflux    Renal failure 12/13/2017   S/P IVC filter 12/13/2017   Schatzki's ring 12/13/2017   Stroke ([place])    X's 4   Valvular heart disease 12/13/2017    Past Surgical History:  Procedure Laterality Date   ABDOMINAL HYSTERECTOMY  1980   TAH    APPENDECTOMY     BACK SURGERY  06/2011   BREAST SURGERY     x2   CAROTID ENDARTERECTOMY  11/11/2010   Right CEA   CORONARY ARTERY BYPASS GRAFT N/A 12/11/2018   Procedure: CORONARY ARTERY BYPASS GRAFTING (CABG) x three , using left internal mammary artery, and right leg greater saphenous vein harvested endoscopically - SVG to OM1, SVG to Distal RCA;  Surgeon: Grace Isaac, MD;  Location: [place];  Service: Open Heart Surgery;  Laterality: N/A;   IVC FILTER INSERTION     JOINT REPLACEMENT Right 2005   Right Total Knee   JOINT REPLACEMENT Left 2005   Left Total Knee   LEFT HEART CATH AND CORONARY ANGIOGRAPHY N/A 12/10/2018   Procedure: LEFT HEART CATH AND CORONARY ANGIOGRAPHY;  Surgeon: Jettie Booze, MD;  Location: [place] [facility];  Service: Cardiovascular;  Laterality: N/A;   SHOULDER SURGERY     right   SPINE SURGERY  07-11-11   Decomp. Laminectomy, fusion of L4-5,L5-S1 by Dr. Saintclair Halsted   TEE WITHOUT CARDIOVERSION N/A 12/11/2018   Procedure: TRANSESOPHAGEAL ECHOCARDIOGRAM (TEE);  Surgeon: Grace Isaac, MD;  Location: [place];  Service: Open Heart Surgery;  Laterality: N/A;   TOTAL KNEE ARTHROPLASTY     right  and left    No current facility-administered medications for this visit.   No current outpatient medications on file.   Facility-Administered Medications Ordered in Other Visits  Medication Dose Route Frequency Provider Last Rate Last Admin   acetaminophen (TYLENOL) tablet 650  mg  650 mg Oral Q6H PRN Marianna Payment, MD       Or   acetaminophen (TYLENOL) suppository 650 mg  650 mg Rectal Q6H PRN Marianna Payment, MD       Derrill Memo ON 10/15/2021] amiodarone (PACERONE) tablet 200 mg  200 mg Oral Daily Marianna Payment, MD       Derrill Memo ON 10/15/2021] amLODipine (NORVASC) tablet 5 mg  5 mg Oral Daily Marianna Payment, MD       Derrill Memo ON 10/15/2021] atorvastatin (LIPITOR) tablet 40 mg  40 mg Oral Daily Marianna Payment, MD       citalopram (CELEXA) tablet 50 mg  50 mg Oral Reino Kent, MD   50 mg at 10/14/21 2040   [START ON 10/15/2021] cloNIDine (CATAPRES) tablet 0.1 mg  0.1 mg Oral QHS Marianna Payment, MD       famotidine (PEPCID) tablet 20 mg  20 mg Oral Daily PRN Marianna Payment, MD   20 mg at 10/14/21 2042   [START ON 10/15/2021] furosemide (LASIX) tablet 20 mg  20 mg  Oral Daily Marianna Payment, MD       lactated ringers infusion   Intravenous Continuous Marianna Payment, MD 75 mL/hr at 10/14/21 1527 New Bag at 10/14/21 1527   [START ON 10/15/2021] levothyroxine (SYNTHROID) tablet 100 mcg  100 mcg Oral Q0600 Marianna Payment, MD       [START ON 10/15/2021] linaclotide Rolan Lipa) capsule 290 mcg  290 mcg Oral Craig Staggers, MD       [START ON 10/15/2021] metoprolol tartrate (LOPRESSOR) tablet 12.5 mg  12.5 mg Oral BID Marianna Payment, MD   12.5 mg at 10/14/21 2041   mirtazapine (REMERON) tablet 30 mg  30 mg Oral Reino Kent, MD   30 mg at 10/14/21 2042   ondansetron (ZOFRAN) tablet 4 mg  4 mg Oral Q6H PRN Marianna Payment, MD       Or   ondansetron [facility]) injection 4 mg  4 mg Intravenous Q6H PRN Marianna Payment, MD       oxyCODONE (Oxy IR/ROXICODONE) immediate release tablet 10 mg  10 mg Oral Q4H PRN Gaylan Gerold, DO   10 mg at 10/14/21 2042   [START ON 10/15/2021] rivaroxaban (XARELTO) tablet 20 mg  20 mg Oral Q supper Marianna Payment, MD       senna-docusate (Senokot-S) tablet 1 tablet  1 tablet Oral QHS PRN Marianna Payment, MD       No Known Allergies  Social History   Tobacco Use   Smoking  status: Never   Smokeless tobacco: Never  Substance Use Topics   Alcohol use: No    Family History  Problem Relation Age of Onset   Clotting disorder Mother    Deep vein thrombosis Mother    Diabetes Mother    Heart disease Mother    Hyperlipidemia Mother    Hypertension Mother    Heart disease Father 40   Varicose Veins Father    Stroke Maternal Grandmother    Deep vein thrombosis Sister    Diabetes Sister    Heart disease Brother       Review of Systems  Respiratory:  Positive for shortness of breath.   Cardiovascular:  Positive for chest pain and palpitations.  Gastrointestinal:  Positive for constipation.  All other systems reviewed and are negative.   Objective:  Physical Exam Constitutional:      General: She is not in acute distress.    Appearance: Normal appearance.  HENT:     Head: Normocephalic and atraumatic.  Eyes:     Extraocular Movements: Extraocular movements intact.     Pupils: Pupils are equal, round, and reactive to light.  Cardiovascular:     Rate and Rhythm: Normal rate and regular rhythm.     Pulses: Normal pulses.     Heart sounds: Murmur heard.  Pulmonary:     Effort: Pulmonary effort is normal. No respiratory distress.     Breath sounds: Normal breath sounds. No wheezing.  Abdominal:     General: Abdomen is flat. Bowel sounds are normal. There is no distension.     Palpations: Abdomen is soft.     Tenderness: There is no abdominal tenderness.  Musculoskeletal:     Cervical back: Normal range of motion and neck supple.     Comments: Examination of the right lower extremity again shows she is neurovascularly intact.  She has good flexion and extension with the knee.  Intact dorsiflexion and plantarflexion with the ankle.  She does have diffuse bilateral lower extremity pitting edema.  Palpable distal  pulses.    Lymphadenopathy:     Cervical: No cervical adenopathy.  Skin:    General: Skin is warm and dry.     Findings: No erythema or  rash.  Neurological:     General: No focal deficit present.     Mental Status: She is alert and oriented to person, place, and time.  Psychiatric:        Mood and Affect: Mood normal.        Behavior: Behavior normal.    Vital signs in last 24 hours: @VSRANGES @  Labs:  Estimated body mass index is 28.34 kg/m as calculated from the following:   Height as of an earlier encounter on 10/14/21: 5\' 3"  (1.6 m).   Weight as of an earlier encounter on 10/14/21: 72.6 kg.  Imaging Review Plain radiographs demonstrate  no   degenerative joint disease of the right knee(s). The overall alignment is neutral.There is evidence of loosening of the tibial components. The bone quality appears to be good for age and reported activity level. Previous x-rays taken on June 04, 2021 again concerning for tibial tray loosening.  Confirmed on bone scan done on July 12, 2021.      Assessment/Plan:  End stage arthritis, right knee(s) with failed previous arthroplasty.   The patient history, physical examination, clinical judgment of the provider and imaging studies are consistent with end stage degenerative joint disease of the right knee(s), previous total knee arthroplasty. Revision total knee arthroplasty is deemed medically necessary. The treatment options including medical management, injection therapy, arthroscopy and revision arthroplasty were discussed at length. The risks and benefits of revision total knee arthroplasty were presented and reviewed. The risks due to aseptic loosening, infection, stiffness, patella tracking problems, thromboembolic complications and other imponderables were discussed. The patient acknowledged the explanation, agreed to proceed with the plan and consent was signed. Patient is being admitted for inpatient treatment for surgery, pain control, PT, OT, prophylactic antibiotics, VTE prophylaxis, progressive ambulation and ADL's and discharge planning.The patient is planning to  be discharged home with home health services

## 2021-10-14 NOTE — H&P (Signed)
Date: 10/14/2021     Patient Name:  Tina Dominguez MRN: [id number]  DOB: 1940-12-20 Age / Sex: 81 y.o., female   PCP: Bonnita Nasuti, MD         Medical Service: Internal Medicine Teaching Service       Attending Physician: Dr. Charise Killian, MD    Intern (1st Contact): Christiana Fuchs, DO Pager: [phone]  Resident (2nd Contact): Gaylan Gerold, DO Pager: Liliane Shi [phone]       After Hours (After 5p/  First Contact Pager: [phone]  weekends / holidays): Second Contact Pager: [phone]   SUBJECTIVE:  Chief Complaint: Epigastric Pain  History of Present Illness: Tina Dominguez is a functionally limited 81 y.o. female with a pertinent PMH of DVT/PE (x4), HLD, HTN, CAD s/p CABG, bilateral carotid stenosis s/p right endarterectomy, embolic CVA, CKD (stage 3), clotting disorder, PAF (xerelto), hypertrophic cardiomyopathy who presents to [facility] with presented with epigastric pain.  Patient states that her epigastric pain started that started at 6 am. The pain acutely started without provocation. She had associated nausea without emesis and SHOB. She attributes thee symptoms to her pain. She denies any other symptoms such as fevers, chills, myalgias, chest pain, weakness, sick contacts, recent travel or new medications.   Medications: No current facility-administered medications on file prior to encounter.   Current Outpatient Medications on File Prior to Encounter  Medication Sig Dispense Refill   acetaminophen (TYLENOL) 500 MG tablet Take 500-1,000 mg by mouth every 6 (six) hours as needed for moderate pain.      amiodarone (PACERONE) 200 MG tablet TAKE 1 TABLET(200 MG) BY MOUTH DAILY (Patient taking differently: Take 200 mg by mouth daily.) 90 tablet 3   amLODipine (NORVASC) 5 MG tablet Take 1 tablet (5 mg total) by mouth daily. 90 tablet 3   atorvastatin (LIPITOR) 40 MG tablet TAKE 1 TABLET BY MOUTH EVERY DAY AT 6 PM (Patient taking differently: Take 40 mg by mouth daily.) 90 tablet 3   citalopram  (CELEXA) 10 MG tablet Take 10 mg by mouth at bedtime. Take with 40 mg to equal 50 mg at bedtime     citalopram (CELEXA) 40 MG tablet Take 40 mg by mouth daily. Take with 10 mg to equal 50 mg at bedtime     cloNIDine (CATAPRES) 0.1 MG tablet Take 1 tablet (0.1 mg total) by mouth at bedtime. Take 1 extra tablet daily if your systolic BP (top number) is greater than 180 and if your heart rate is greater than 100. 120 tablet 3   famotidine (PEPCID) 20 MG tablet Take 20 mg by mouth daily as needed for heartburn or indigestion.     furosemide (LASIX) 20 MG tablet Take 20 mg by mouth daily.     levothyroxine (SYNTHROID) 100 MCG tablet Take 100 mcg by mouth daily.     linaclotide (LINZESS) 290 MCG CAPS capsule Take 290 mcg by mouth every other day.     metoprolol tartrate (LOPRESSOR) 25 MG tablet Metoprolol tartrate 12.5 mg (half tablet) in the morning daily. Hold for heart rate <100. 30 tablet 2   mirtazapine (REMERON) 30 MG tablet Take 30 mg by mouth at bedtime.      Multiple Vitamin (MULTIVITAMIN) tablet Take 1 tablet by mouth daily.     ondansetron (ZOFRAN) 8 MG tablet Take 8 mg by mouth every 8 (eight) hours as needed for nausea/vomiting.     Oxycodone HCl 10 MG TABS Take 10 mg by mouth every 6 (  six) hours as needed (pain).      potassium chloride SA (K-DUR) 20 MEQ tablet Take 1 Tab (31meq) every other day with lasix 90 tablet 2   XARELTO 20 MG TABS tablet Take 20 mg by mouth daily.  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   Reflux    Renal failure 12/13/2017   S/P IVC filter 12/13/2017   Schatzki's ring 12/13/2017   Stroke ([place])    X's 4   Valvular heart disease 12/13/2017    Social:  Lives - [address],  [place] [phone]  Support - spouse Level of function: Needed some help, needing a cone to ambulate around the house  Physical - use a cane ADLs - indepenndent IADLs - independent Occupation - retired  PCP - [place], Imran P,  Substance use: Illicit - none  ETOH - none Tobacco - none  Family History: Family History  Problem Relation Age of Onset   Clotting disorder Mother    Deep vein thrombosis Mother    Diabetes Mother    Heart disease Mother    Hyperlipidemia Mother    Hypertension Mother    Heart disease Father 52   Varicose Veins Father    Stroke Maternal Grandmother    Deep vein thrombosis Sister    Diabetes Sister    Heart disease Brother     Allergies: Allergies as of 10/14/2021   (No Known Allergies)    Review of Systems: A complete ROS  was negative except as per HPI.   OBJECTIVE:  Physical Exam: Blood pressure (!) 162/64, pulse 65, temperature 98.4 F (36.9 C), temperature source Oral, resp. rate 14, height 5\' 3"  (1.6 m), weight 72.6 kg, SpO2 97 %. Physical Exam Constitutional:      General: She is not in acute distress. HENT:     Head: Normocephalic and atraumatic.  Eyes:     Extraocular Movements: Extraocular movements intact.     Pupils: Pupils are equal, round, and reactive to light.  Cardiovascular:     Rate and Rhythm: Normal rate.     Pulses: Normal pulses.     Heart sounds: Normal heart sounds.  Pulmonary:     Effort: Pulmonary effort is normal.     Breath sounds: Normal breath sounds.  Abdominal:     General: There is no distension.     Palpations: Abdomen is soft.     Tenderness: There is abdominal tenderness (epigastric and RUQ). There is guarding.  Musculoskeletal:        General: No swelling. Normal range of motion.     Cervical back: Normal range of motion.  Skin:    General: Skin is warm and dry.  Neurological:     General: No focal deficit present.     Mental Status: She is alert and oriented to person, place, and time.    Pertinent Labs: CBC    Component Value Date/Time   WBC 9.4 10/14/2021 0851   RBC 4.04 10/14/2021 0851   HGB 12.1 10/14/2021 0851   HGB 12.9 12/13/2019 1346   HCT 38.0 10/14/2021 0851   HCT 38.8 12/13/2019 1346   PLT 329 10/14/2021 0851   PLT 382 12/13/2019 1346   MCV 94.1 10/14/2021 0851   MCV 95 12/13/2019 1346   MCH 30.0 10/14/2021 0851   MCHC 31.8 10/14/2021 0851   RDW 13.1 10/14/2021 0851   RDW 12.7 12/13/2019 1346   LYMPHSABS 2.0 07/12/2011 1212   MONOABS 0.3 07/12/2011 1212   EOSABS 0.1 07/12/2011 1212   BASOSABS 0.0 07/12/2011 1212     CMP     Component Value Date/Time   NA 139 10/14/2021 0851   NA 143 12/13/2019 1346   K 3.9 10/14/2021 0851   CL 102 10/14/2021 0851   CO2 27 10/14/2021 0851   GLUCOSE 116 (H) 10/14/2021 0851   BUN 16  10/14/2021 0851   BUN 24 12/13/2019 1346   CREATININE 1.12 (H) 10/14/2021 0851   CALCIUM 9.0 10/14/2021 0851   PROT 6.8 10/14/2021 0851   PROT 6.9 12/13/2019 1346   ALBUMIN 3.5 10/14/2021 0851   ALBUMIN 4.4 12/13/2019 1346   AST 21 10/14/2021 0851   ALT 9 10/14/2021 0851   ALKPHOS 81 10/14/2021 0851   BILITOT 0.6 10/14/2021 0851   BILITOT 0.2 12/13/2019 1346   GFRNONAA 50 (L) 10/14/2021 0851   GFRAA 57 (L) 12/13/2019 1346    Pertinent Imaging: CT ABDOMEN PELVIS W CONTRAST Result Date: 10/14/2021 1. Mild heterogeneity of the pancreatic head with surrounding inflammatory change and fluid, consistent with acute pancreatitis. No peripancreatic fluid collection.  2. Mild central intrahepatic and common bile duct dilatation to the ampulla without obstructing lesion. This may be reactive to underlying pancreatitis. Correlate with LFTs.  3. Mild right hydroureteronephrosis without obstructing calculus.  4. 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  5. Unchanged cholelithiasis.  6. Aortic Atherosclerosis   DG Chest Port 1 View Result Date: 10/14/2021 No acute process in the chest.    EKG: normal sinus rhythm, new LBBB and likely LVH  ASSESSMENT & PLAN:  Patient Summary: JING HOWATT is a functionally limited 81 y.o. female with a pertinent PMH of DVT/PE (x4), HLD, HTN, CAD s/p CABG, bilateral carotid stenosis s/p right endarterectomy, embolic CVA, CKD (stage 3), clotting disorder, PAF (xerelto), hypertrophic cardiomyopathy who presents to [facility] with presented with epigastric pain and admit for acute pancreatitis.   Assessment: Principal Problem:   Acute pancreatitis  Plan: #Acute pancreatitis: Differential includes gallstone pancreatitis, medication  induced or ischemic etiology.  - lipase elevated at 190 - CT scan showed evidence of pancreatitis  - MRCP pending - Lipid panel pending - Advance diet as tolerated - Encourage oral fluid intake and can give modest IV  fluids as needed - Oxycodone for pain management - Zofran for nausea  #New LBBB on EKG: Negative troponins. Given CV history, will get repeat TTE to evaluate structure etiologies.  - TTE  #PAF: - Continue Amiodarone and xerelto tomorrow  #CAD s/p CABG: - Cont atorvastatin, furosemide, and metoprolol  - Strict INO, daily weights - Electrolytes pending   #Hypothyroidism: - Cont Levo  Best Practice: Diet: Cardiac diet IVF: LR,  75 cc/hr x 8 hrs VTE: rivaroxaban (XARELTO) tablet 20 mg Start: 10/14/21 1330 Code: Full Code AB: none Dispo: Inpatient with expected length of stay greater than 2 midnights. Anticipated Discharge Location: Home Barriers to Discharge: Medical stability Family Contact:  Spouse , at bedside.  Signature: Lawerance Cruel, D.O.  Internal Medicine Resident, PGY-3 [facility]  Pager: [phone] 2:12 PM, 10/14/2021   Please contact the on call pager after 5 pm and on weekends at [phone].

## 2021-10-14 NOTE — Hospital Course (Addendum)
Pain is back to baseline Eating food ok, denies N/V o Taking Oxy 10 mg q6h at home Follow up with PCP in 1 week and GI f/u. Dr. Posey Rea

## 2021-10-14 NOTE — ED Notes (Signed)
Patient transported to CT 

## 2021-10-14 NOTE — ED Triage Notes (Signed)
"  Came from home, call came out for chest pain, she stated her doctor told her she couldn't take asprin or nitro, so we did not give it. She did take lasix prior to our arrival" per EMS Patient points to epigastric area, states her chest pain started around 0630, described as intermittent pressure. Nausea, no vomiting

## 2021-10-14 NOTE — Plan of Care (Signed)
  Problem: Activity: Goal: Risk for activity intolerance will decrease Outcome: Progressing   

## 2021-10-15 ENCOUNTER — Inpatient Hospital Stay (HOSPITAL_COMMUNITY): Payer: PPO

## 2021-10-15 ENCOUNTER — Encounter (HOSPITAL_COMMUNITY): Payer: Self-pay | Admitting: Internal Medicine

## 2021-10-15 DIAGNOSIS — R001 Bradycardia, unspecified: Secondary | ICD-10-CM

## 2021-10-15 DIAGNOSIS — I480 Paroxysmal atrial fibrillation: Secondary | ICD-10-CM

## 2021-10-15 DIAGNOSIS — I2510 Atherosclerotic heart disease of native coronary artery without angina pectoris: Secondary | ICD-10-CM

## 2021-10-15 DIAGNOSIS — K851 Biliary acute pancreatitis without necrosis or infection: Principal | ICD-10-CM

## 2021-10-15 DIAGNOSIS — I361 Nonrheumatic tricuspid (valve) insufficiency: Secondary | ICD-10-CM

## 2021-10-15 LAB — COMPREHENSIVE METABOLIC PANEL
ALT: 10 U/L (ref 0–44)
AST: 17 U/L (ref 15–41)
Albumin: 2.9 g/dL — ABNORMAL LOW (ref 3.5–5.0)
Alkaline Phosphatase: 72 U/L (ref 38–126)
Anion gap: 6 (ref 5–15)
BUN: 12 mg/dL (ref 8–23)
CO2: 31 mmol/L (ref 22–32)
Calcium: 8.7 mg/dL — ABNORMAL LOW (ref 8.9–10.3)
Chloride: 102 mmol/L (ref 98–111)
Creatinine, Ser: 1.02 mg/dL — ABNORMAL HIGH (ref 0.44–1.00)
GFR, Estimated: 56 mL/min — ABNORMAL LOW (ref >60)
Glucose, Bld: 109 mg/dL — ABNORMAL HIGH (ref 70–99)
Potassium: 3.7 mmol/L (ref 3.5–5.1)
Sodium: 139 mmol/L (ref 135–145)
Total Bilirubin: 0.4 mg/dL (ref 0.3–1.2)
Total Protein: 5.9 g/dL — ABNORMAL LOW (ref 6.5–8.1)

## 2021-10-15 LAB — CBC
HCT: 34.4 % — ABNORMAL LOW (ref 36.0–46.0)
Hemoglobin: 10.8 g/dL — ABNORMAL LOW (ref 12.0–15.0)
MCH: 28.9 pg (ref 26.0–34.0)
MCHC: 31.4 g/dL (ref 30.0–36.0)
MCV: 92 fL (ref 80.0–100.0)
Platelets: 314 10*3/uL (ref 150–400)
RBC: 3.74 MIL/uL — ABNORMAL LOW (ref 3.87–5.11)
RDW: 13 % (ref 11.5–15.5)
WBC: 9.4 10*3/uL (ref 4.0–10.5)
nRBC: 0 % (ref 0.0–0.2)

## 2021-10-15 LAB — ECHOCARDIOGRAM COMPLETE
AR max vel: 2.38 cm2
AV Peak grad: 9.6 mmHg
Ao pk vel: 1.55 m/s
Area-P 1/2: 3.93 cm2
Height: 63 in
S' Lateral: 3 cm
Weight: 2560 oz

## 2021-10-15 LAB — MAGNESIUM: Magnesium: 1.6 mg/dL — ABNORMAL LOW (ref 1.7–2.4)

## 2021-10-15 LAB — GLUCOSE, CAPILLARY: Glucose-Capillary: 155 mg/dL — ABNORMAL HIGH (ref 70–99)

## 2021-10-15 LAB — LACTIC ACID, PLASMA: Lactic Acid, Venous: 1.4 mmol/L (ref 0.5–1.9)

## 2021-10-15 MED ORDER — POTASSIUM CHLORIDE 10 MEQ/100ML IV SOLN
10.0000 meq | INTRAVENOUS | Status: AC
  Administered 2021-10-15 (×2): 10 meq via INTRAVENOUS
  Filled 2021-10-15 (×4): qty 100

## 2021-10-15 MED ORDER — RIVAROXABAN 10 MG PO TABS
20.0000 mg | ORAL_TABLET | Freq: Every day | ORAL | Status: DC
  Administered 2021-10-15: 20 mg via ORAL
  Filled 2021-10-15: qty 2

## 2021-10-15 MED ORDER — MAGNESIUM SULFATE 2 GM/50ML IV SOLN
2.0000 g | Freq: Once | INTRAVENOUS | Status: AC
  Administered 2021-10-15: 2 g via INTRAVENOUS
  Filled 2021-10-15: qty 50

## 2021-10-15 MED ORDER — POTASSIUM CHLORIDE 10 MEQ/100ML IV SOLN
10.0000 meq | INTRAVENOUS | Status: AC
  Administered 2021-10-15 (×2): 10 meq via INTRAVENOUS

## 2021-10-15 NOTE — Progress Notes (Signed)
°   10/15/21 0821  Vitals  Temp 98 F (36.7 C)  Temp Source Oral  BP (!) 130/52  MAP (mmHg) 76  BP Location Left Arm  BP Method Automatic  Patient Position (if appropriate) Lying  Pulse Rate (!) 52  Pulse Rate Source Monitor  Resp 18  MEWS COLOR  MEWS Score Color Green  Oxygen Therapy  SpO2 92 %  O2 Device Room Air  MEWS Score  MEWS Temp 0  MEWS Systolic 0  MEWS Pulse 0  MEWS RR 0  MEWS LOC 0  MEWS Score 0  Provider Notification  Provider Name/Title Christiana Fuchs, DO  Date Provider Notified 10/15/21  Time Provider Notified [phone]  Notification Type Page  Notification Reason Other (Comment) (hold of medication)  Provider response See new orders (ekg)  Date of Provider Response 10/15/21  Time of Provider Response [phone]

## 2021-10-15 NOTE — Progress Notes (Signed)
Echocardiogram 2D Echocardiogram has been performed.  Jefferey Pica 10/15/2021, 2:24 PM

## 2021-10-15 NOTE — Consult Note (Addendum)
Consult Note  Tina Dominguez 04-Mar-1941  [id number].    Requesting MD: Dr. Saverio Danker Chief Complaint/Reason for Consult: gallstone pancreatitis   HPI:  81 year old female who presented to [facility] ED on 1/19 with epigastric pain. Patient reports the pain began around 6am and was located in her lower chest/epigastrium radiating to her RUQ and R flank. She reports associated sob, burping/belching, and nausea. Denies associated fever, chills, or emesis. No hx of similar symptoms in the past. She tried famotidine for this without relief. PO intake does not seem to make this worse.   Work up in ED significant for new LBBB and troponin WNL. Lipase elevated to 190. CT scan showing acute pancreatitis; cholelithiasis present without GB wall thickening. MRCP negative for choledocholithiasis and without dilation of the CBD. It did show evidence of Cholelithiasis but no evidence of Cholecystitis. LFT's wnl. WBC wnl. Lipase has not been rechecked today.   She was admitted to the [facility] program for further evaluation and treatment. General surgery asked to see in regard to Gallstone Pancreatitis.   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 Also with history of CAD s/p carotid endarterectomy, s/p CABG, CKD 3, GERD, hiatal hernia, gout, HTN, hyperlipidemia. 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   Today her 10/10 pain on presentation has now improved to 4/10 and is mainly located in the epigastrium. She is tolerating cld without n/v.   Substance use: denies ETOH and tobacco use Allergies: NKDA Blood thinners: xarelto - last dose am 1/19am Past Surgeries: abdominal hysterectomy, appendectomy   ROS: Review of Systems  Constitutional:  Negative for chills and fever.  Respiratory:  Positive for  shortness of breath. Negative for cough and wheezing.   Cardiovascular:  Positive for chest pain. Negative for leg swelling.  Gastrointestinal:  Positive for abdominal pain and nausea. Negative for constipation, diarrhea and vomiting.  Genitourinary: Negative.   All other systems reviewed and are negative.  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  History of recurrent TIAs 12/13/2017   History of right-sided carotid endarterectomy 10/10/2011   Hyperlipidemia    Hypertension    Hypertensive heart disease with heart failure ([place]) 01/29/2019   Leg pain    with walking   Occlusion and stenosis of carotid artery without mention of cerebral infarction 10/08/2012   Osteoarthritis 05/26/2011   Osteoporosis    PAT (paroxysmal atrial tachycardia) ([place]) 12/05/2018   PE (pulmonary embolism)  05/26/2011   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[place]) 04/17/2017   Reflux    Renal failure 12/13/2017   S/P IVC filter 12/13/2017   Schatzki's ring 12/13/2017   Stroke ([place])    X's 4   Valvular heart disease 12/13/2017    Past Surgical History:  Procedure Laterality Date   ABDOMINAL HYSTERECTOMY  1980   TAH    APPENDECTOMY     BACK SURGERY  06/2011   BREAST SURGERY     x2   CAROTID ENDARTERECTOMY  11/11/2010   Right CEA   CORONARY ARTERY BYPASS GRAFT N/A 12/11/2018   Procedure: CORONARY ARTERY BYPASS GRAFTING (CABG) x three , using left internal mammary artery, and right leg greater saphenous vein harvested endoscopically - SVG to OM1, SVG to Distal RCA;  Surgeon: Grace Isaac, MD;  Location: [facility];  Service: Open Heart Surgery;  Laterality: N/A;   IVC FILTER INSERTION     JOINT REPLACEMENT Right 2005   Right Total Knee   JOINT REPLACEMENT Left 2005   Left Total Knee   LEFT HEART CATH AND CORONARY ANGIOGRAPHY N/A 12/10/2018   Procedure: LEFT HEART CATH AND CORONARY ANGIOGRAPHY;  Surgeon: Jettie Booze, MD;  Location: [facility];  Service: Cardiovascular;  Laterality: N/A;   SHOULDER SURGERY     right   SPINE SURGERY  07-11-11   Decomp. Laminectomy, fusion of L4-5,L5-S1 by Dr. Saintclair Halsted   TEE WITHOUT CARDIOVERSION N/A 12/11/2018   Procedure: TRANSESOPHAGEAL ECHOCARDIOGRAM (TEE);  Surgeon: Grace Isaac, MD;  Location: [facility];  Service: Open Heart Surgery;  Laterality: N/A;   TOTAL KNEE ARTHROPLASTY     right  and left    Social History:  reports that she has never smoked. She has never used smokeless tobacco. She reports that she does not drink alcohol and does not use drugs.  Allergies: No Known Allergies  Medications Prior to Admission  Medication Sig Dispense Refill   acetaminophen (TYLENOL) 500 MG tablet Take 500-1,000 mg by mouth every 6 (six) hours as needed for moderate pain.      amiodarone (PACERONE) 200 MG tablet TAKE 1 TABLET(200 MG) BY MOUTH DAILY (Patient  taking differently: Take 200 mg by mouth daily.) 90 tablet 3   amLODipine (NORVASC) 5 MG tablet Take 1 tablet (5 mg total) by mouth daily. 90 tablet 3   atorvastatin (LIPITOR) 40 MG tablet TAKE 1 TABLET BY MOUTH EVERY DAY AT 6 PM (Patient taking differently: Take 40 mg by mouth daily.) 90 tablet 3   citalopram (CELEXA) 10 MG tablet Take 10 mg by mouth at bedtime. Take with 40 mg to equal 50 mg at bedtime     citalopram (CELEXA) 40 MG tablet Take 40 mg by mouth daily. Take with 10 mg to equal 50 mg at bedtime     cloNIDine (CATAPRES) 0.1 MG tablet Take 1 tablet (0.1 mg total) by mouth at bedtime. Take 1 extra tablet daily if your systolic BP (top number) is greater than 180 and if your heart rate is greater than 100. 120  tablet 3   famotidine (PEPCID) 20 MG tablet Take 20 mg by mouth daily as needed for heartburn or indigestion.     furosemide (LASIX) 20 MG tablet Take 20 mg by mouth daily.     levothyroxine (SYNTHROID) 100 MCG tablet Take 100 mcg by mouth daily.     linaclotide (LINZESS) 290 MCG CAPS capsule Take 290 mcg by mouth every other day.     metoprolol tartrate (LOPRESSOR) 25 MG tablet Metoprolol tartrate 12.5 mg (half tablet) in the morning daily. Hold for heart rate <100. 30 tablet 2   mirtazapine (REMERON) 30 MG tablet Take 30 mg by mouth at bedtime.      Multiple Vitamin (MULTIVITAMIN) tablet Take 1 tablet by mouth daily.     ondansetron (ZOFRAN) 8 MG tablet Take 8 mg by mouth every 8 (eight) hours as needed for nausea/vomiting.     Oxycodone HCl 10 MG TABS Take 10 mg by mouth every 6 (six) hours as needed (pain).      potassium chloride SA (K-DUR) 20 MEQ tablet Take 1 Tab (54meq) every other day with lasix 90 tablet 2   XARELTO 20 MG TABS tablet Take 20 mg by mouth daily.  0   pantoprazole (PROTONIX) 40 MG tablet TAKE 1 TABLET(40 MG) BY MOUTH TWICE DAILY (Patient not taking: Reported on 10/14/2021) 180 tablet 1    Blood pressure (!) 130/52, pulse (!) 52, temperature 98 F (36.7 C),  temperature source Oral, resp. rate 18, height 5\' 3"  (1.6 m), weight 72.6 kg, SpO2 92 %. Physical Exam General: pleasant, WD/WN white female who is laying in bed in NAD HEENT: head is normocephalic, atraumatic.  Sclera are noninjected.  PERRL.  Ears and nose without any masses or lesions.  Mouth is pink and moist. Dentition fair Heart: Irregular rhythm with regular rhythm. No obvious murmurs.  Palpable pedal pulses bilaterally  Lungs: CTAB, no wheezes, rhonchi, or rales noted.  Respiratory effort nonlabored. On RA Abd: Soft, ND, epigatric and RUQ tenderness noted. +BS, no masses, hernias, or organomegaly. Prior abdominal scars noted and well healed.  MS: no BUE/BLE edema, calves soft and nontender Skin: warm and dry with no masses, lesions, or rashes Psych: A&Ox4 with an appropriate affect Neuro: cranial nerves grossly intact, equal strength in BUE/BLE bilaterally, normal speech, thought process intact, moves all extremities, gait not assessed  Results for orders placed or performed during the hospital encounter of 10/14/21 (from the past 48 hour(s))  Basic metabolic panel     Status: Abnormal   Collection Time: 10/14/21  8:51 AM  Result Value Ref Range   Sodium 139 135 - 145 mmol/L   Potassium 3.9 3.5 - 5.1 mmol/L   Chloride 102 98 - 111 mmol/L   CO2 27 22 - 32 mmol/L   Glucose, Bld 116 (H) 70 - 99 mg/dL    Comment: Glucose reference range applies only to samples taken after fasting for at least 8 hours.   BUN 16 8 - 23 mg/dL   Creatinine, Ser 1.12 (H) 0.44 - 1.00 mg/dL   Calcium 9.0 8.9 - 10.3 mg/dL   GFR, Estimated 50 (L) >60 mL/min    Comment: (NOTE) Calculated using the CKD-EPI Creatinine Equation (2021)    Anion gap 10 5 - 15    Comment: Performed at [facility] [address], [place], [place] [address]  CBC     Status: None   Collection Time: 10/14/21  8:51 AM  Result Value Ref Range  WBC 9.4 4.0 - 10.5 K/uL   RBC 4.04 3.87 - 5.11 MIL/uL   Hemoglobin 12.1 12.0 -  15.0 g/dL   HCT 38.0 36.0 - 46.0 %   MCV 94.1 80.0 - 100.0 fL   MCH 30.0 26.0 - 34.0 pg   MCHC 31.8 30.0 - 36.0 g/dL   RDW 13.1 11.5 - 15.5 %   Platelets 329 150 - 400 K/uL   nRBC 0.0 0.0 - 0.2 %    Comment: Performed at [facility], [place] [address] [place], [place] [address]  Troponin I (High Sensitivity)     Status: None   Collection Time: 10/14/21  8:51 AM  Result Value Ref Range   Troponin I (High Sensitivity) 9 <18 ng/L    Comment: (NOTE) Elevated high sensitivity troponin I (hsTnI) values and significant  changes across serial measurements may suggest ACS but many other  chronic and acute conditions are known to elevate hsTnI results.  Refer to the Links section for chest pain algorithms and additional  guidance. Performed at [facility], [place] [address], [place], [place] [address]   Lipase, blood     Status: Abnormal   Collection Time: 10/14/21  8:51 AM  Result Value Ref Range   Lipase 190 (H) 11 - 51 U/L    Comment: Performed at [facility] [address] [place], [place] [address]  Hepatic function panel     Status: None   Collection Time: 10/14/21  8:51 AM  Result Value Ref Range   Total Protein 6.8 6.5 - 8.1 g/dL   Albumin 3.5 3.5 - 5.0 g/dL   AST 21 15 - 41 U/L   ALT 9 0 - 44 U/L   Alkaline Phosphatase 81 38 - 126 U/L   Total Bilirubin 0.6 0.3 - 1.2 mg/dL   Bilirubin, Direct <0.1 0.0 - 0.2 mg/dL   Indirect Bilirubin NOT CALCULATED 0.3 - 0.9 mg/dL    Comment: Performed at [facility] [address], [place], [place] [address]  Urinalysis, Routine w reflex microscopic Urine, Clean Catch     Status: Abnormal   Collection Time: 10/14/21  8:51 AM  Result Value Ref Range   Color, Urine YELLOW YELLOW   APPearance CLEAR CLEAR   Specific Gravity, Urine 1.010 1.005 - 1.030   pH 5.5 5.0 - 8.0   Glucose, UA NEGATIVE NEGATIVE mg/dL   Hgb urine dipstick NEGATIVE NEGATIVE   Bilirubin Urine NEGATIVE NEGATIVE   Ketones, ur NEGATIVE NEGATIVE  mg/dL   Protein, ur NEGATIVE NEGATIVE mg/dL   Nitrite NEGATIVE NEGATIVE   Leukocytes,Ua TRACE (A) NEGATIVE    Comment: Performed at [facility] [address], [place], [place] [address]  Urinalysis, Microscopic (reflex)     Status: Abnormal   Collection Time: 10/14/21  8:51 AM  Result Value Ref Range   RBC / HPF 0-5 0 - 5 RBC/hpf   WBC, UA 6-10 0 - 5 WBC/hpf   Bacteria, UA RARE (A) NONE SEEN   Squamous Epithelial / LPF 0-5 0 - 5    Comment: Performed at [facility], [place] [address] [place], [place] [address]  Troponin I (High Sensitivity)     Status: None   Collection Time: 10/14/21 10:33 AM  Result Value Ref Range   Troponin I (High Sensitivity) 17 <18 ng/L    Comment: (NOTE) Elevated high sensitivity troponin I (hsTnI) values and significant  changes across serial measurements may suggest ACS  but many other  chronic and acute conditions are known to elevate hsTnI results.  Refer to the "Links" section for chest pain algorithms and additional  guidance. Performed at [facility], [place] [address] [place], [place] [address]   Lipid panel     Status: None   Collection Time: 10/14/21 11:31 AM  Result Value Ref Range   Cholesterol 118 0 - 200 mg/dL   Triglycerides 96 <150 mg/dL   HDL 58 >40 mg/dL   Total CHOL/HDL Ratio 2.0 RATIO   VLDL 19 0 - 40 mg/dL   LDL Cholesterol 41 0 - 99 mg/dL    Comment:        Total Cholesterol/HDL:CHD Risk Coronary Heart Disease Risk Table                     Men   Women  1/2 Average Risk   3.4   3.3  Average Risk       5.0   4.4  2 X Average Risk   9.6   7.1  3 X Average Risk  23.4   11.0        Use the calculated Patient Ratio above and the CHD Risk Table to determine the patient's CHD Risk.        ATP III CLASSIFICATION (LDL):  <100     mg/dL   Optimal  100-129  mg/dL   Near or Above                    Optimal  130-159  mg/dL   Borderline  160-189  mg/dL   High  >190     mg/dL   Very High Performed at [facility] [address] [place], [place] [address]   Resp Panel by RT-PCR (Flu A&B, Covid) Nasopharyngeal Swab     Status: None   Collection Time: 10/14/21 11:31 AM   Specimen: Nasopharyngeal Swab; Nasopharyngeal(NP) swabs in vial transport medium  Result Value Ref Range   SARS Coronavirus 2 by RT PCR NEGATIVE NEGATIVE    Comment: (NOTE) SARS-CoV-2 target nucleic acids are NOT DETECTED.  The SARS-CoV-2 RNA is generally detectable in upper respiratory specimens during the acute phase of infection. The lowest concentration of SARS-CoV-2 viral copies this assay can detect is 138 copies/mL. A negative result does not preclude SARS-Cov-2 infection and should not be used as the sole basis for treatment or other patient management decisions. A negative result may occur with  improper specimen collection/handling, submission of specimen other than nasopharyngeal swab, presence of viral mutation(s) within the areas targeted by this assay, and inadequate number of viral copies(<138 copies/mL). A negative result must be combined with clinical observations, patient history, and epidemiological information. The expected result is Negative.  Fact Sheet for Patients:  EntrepreneurPulse.com.au  Fact Sheet for Healthcare Providers:  IncredibleEmployment.be  This test is no t yet approved or cleared by the [place] FDA and  has been authorized for detection and/or diagnosis of SARS-CoV-2 by FDA under an Emergency Use Authorization (EUA). This EUA will remain  in effect (meaning this test can be used) for the duration of the COVID-19 declaration under Section 564(b)(1) of the Act, 21 U.S.C.section 360bbb-3(b)(1), unless the authorization is terminated  or revoked sooner.       Influenza A by PCR NEGATIVE NEGATIVE   Influenza B by PCR NEGATIVE NEGATIVE    Comment: (NOTE) The Xpert Xpress SARS-CoV-2/FLU/RSV plus assay is intended as an  aid in the  diagnosis of influenza from Nasopharyngeal swab specimens and should not be used as a sole basis for treatment. Nasal washings and aspirates are unacceptable for Xpert Xpress SARS-CoV-2/FLU/RSV testing.  Fact Sheet for Patients: EntrepreneurPulse.com.au  Fact Sheet for Healthcare Providers: IncredibleEmployment.be  This test is not yet approved or cleared by the [place] FDA and has been authorized for detection and/or diagnosis of SARS-CoV-2 by FDA under an Emergency Use Authorization (EUA). This EUA will remain in effect (meaning this test can be used) for the duration of the COVID-19 declaration under Section 564(b)(1) of the Act, 21 U.S.C. section 360bbb-3(b)(1), unless the authorization is terminated or revoked.  Performed at [facility], [place] [address], [place], [place] [address]   Phosphorus     Status: None   Collection Time: 10/14/21 11:31 AM  Result Value Ref Range   Phosphorus 3.4 2.5 - 4.6 mg/dL    Comment: Performed at [facility] [address], [place], [place] [address]  Magnesium     Status: Abnormal   Collection Time: 10/14/21 11:31 AM  Result Value Ref Range   Magnesium 1.4 (L) 1.7 - 2.4 mg/dL    Comment: Performed at [facility] [address] [place], [place] [address]  CBC     Status: Abnormal   Collection Time: 10/15/21  4:16 AM  Result Value Ref Range   WBC 9.4 4.0 - 10.5 K/uL   RBC 3.74 (L) 3.87 - 5.11 MIL/uL   Hemoglobin 10.8 (L) 12.0 - 15.0 g/dL   HCT 34.4 (L) 36.0 - 46.0 %   MCV 92.0 80.0 - 100.0 fL   MCH 28.9 26.0 - 34.0 pg   MCHC 31.4 30.0 - 36.0 g/dL   RDW 13.0 11.5 - 15.5 %   Platelets 314 150 - 400 K/uL   nRBC 0.0 0.0 - 0.2 %    Comment: Performed at [facility], [place] [address] [place], [place] [address]  Comprehensive metabolic panel     Status: Abnormal   Collection Time: 10/15/21  4:16 AM  Result Value Ref Range   Sodium 139 135 - 145 mmol/L   Potassium 3.7 3.5  - 5.1 mmol/L   Chloride 102 98 - 111 mmol/L   CO2 31 22 - 32 mmol/L   Glucose, Bld 109 (H) 70 - 99 mg/dL    Comment: Glucose reference range applies only to samples taken after fasting for at least 8 hours.   BUN 12 8 - 23 mg/dL   Creatinine, Ser 1.02 (H) 0.44 - 1.00 mg/dL   Calcium 8.7 (L) 8.9 - 10.3 mg/dL   Total Protein 5.9 (L) 6.5 - 8.1 g/dL   Albumin 2.9 (L) 3.5 - 5.0 g/dL   AST 17 15 - 41 U/L   ALT 10 0 - 44 U/L   Alkaline Phosphatase 72 38 - 126 U/L   Total Bilirubin 0.4 0.3 - 1.2 mg/dL   GFR, Estimated 56 (L) >60 mL/min    Comment: (NOTE) Calculated using the CKD-EPI Creatinine Equation (2021)    Anion gap 6 5 - 15    Comment: Performed at [facility], [place] [address], [place],  [address]  Glucose, capillary     Status: Abnormal   Collection Time: 10/15/21  6:18 AM  Result Value Ref Range   Glucose-Capillary 155 (H) 70 - 99 mg/dL    Comment: Glucose reference range applies only to samples taken after fasting for at least  8 hours.  Magnesium     Status: Abnormal   Collection Time: 10/15/21  6:54 AM  Result Value Ref Range   Magnesium 1.6 (L) 1.7 - 2.4 mg/dL    Comment: Performed at [facility] [address], [place], [place] [address]   CT ABDOMEN PELVIS W CONTRAST  Result Date: 10/14/2021 CLINICAL DATA:  Lower abdominal pain with nausea and vomiting. EXAM: CT ABDOMEN AND PELVIS WITH CONTRAST TECHNIQUE: Multidetector CT imaging of the abdomen and pelvis was performed using the standard protocol following bolus administration of intravenous contrast. RADIATION DOSE REDUCTION: This exam was performed according to the departmental dose-optimization program which includes automated exposure control, adjustment of the mA and/or kV according to patient size and/or use of iterative reconstruction technique. CONTRAST:  158mL OMNIPAQUE IOHEXOL 300 MG/ML  SOLN COMPARISON:  CT abdomen pelvis dated September 16, 2017. FINDINGS: Lower chest: No acute abnormality.   Minimal bibasilar atelectasis. Hepatobiliary: No focal liver abnormality. Multiple tiny gallstones again noted. No gallbladder wall thickening. Mild central intrahepatic and common bile duct dilatation to the ampulla. Pancreas: Mild heterogeneity of the pancreatic head with surrounding inflammatory change and fluid. No ductal dilatation. Spleen: Normal in size without focal abnormality. Adrenals/Urinary Tract: Adrenal glands are unremarkable. Unchanged 1.0 cm simple cyst in the left kidney. Other subcentimeter low-density lesions in both kidneys remain too small to characterize. No renal calculi. Mild right hydroureteronephrosis. The bladder is unremarkable. Stomach/Bowel: Unchanged moderate hiatal hernia. The stomach is otherwise within normal limits. No bowel wall thickening, distention, or surrounding inflammatory changes. Sigmoid colonic diverticulosis. History of prior appendectomy. Vascular/Lymphatic: Aortoiliac atherosclerotic vascular disease. 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 Unchanged IVC filter. No enlarged abdominal or pelvic lymph nodes. Reproductive: Status post hysterectomy. No adnexal masses. Other: No abdominal wall hernia or abnormality. No abdominopelvic ascites. Musculoskeletal: No acute or significant osseous findings. Prior lumbosacral fusion. Chronic lower thoracic compression deformities. IMPRESSION: 1. Mild heterogeneity of the pancreatic head with surrounding inflammatory change and fluid, consistent with acute pancreatitis. No peripancreatic fluid collection. 2. Mild central intrahepatic and common bile duct dilatation to the ampulla without obstructing lesion. This may be reactive to underlying pancreatitis. Correlate with LFTs. 3. Mild right hydroureteronephrosis without obstructing calculus. 4. 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 5. Unchanged cholelithiasis. 6. Aortic Atherosclerosis (ICD10-I70.0).  Electronically Signed   By: Titus Dubin M.D.   On: 10/14/2021 11:12   DG Chest Port 1 View  Result Date: 10/14/2021 CLINICAL DATA:  Chest pain EXAM: PORTABLE CHEST 1 VIEW COMPARISON:  06/12/2020 FINDINGS: The heart size and mediastinal contours are within normal limits for technique. Both lungs are clear. No pleural effusion or pneumothorax. The visualized skeletal structures are unremarkable. IMPRESSION: No acute process in the chest. Electronically Signed   By: Macy Mis M.D.   On: 10/14/2021 09:08   MR ABDOMEN MRCP W WO CONTAST  Result Date: 10/14/2021 CLINICAL DATA:  Pancreatitis suspected. Evaluate for cholelithiasis. EXAM: MRI ABDOMEN WITHOUT AND WITH CONTRAST (INCLUDING MRCP) TECHNIQUE: Multiplanar multisequence MR imaging of the abdomen was performed both before and after the administration of intravenous contrast. Heavily T2-weighted images of the biliary and pancreatic ducts were obtained, and three-dimensional MRCP images were rendered by post processing. CONTRAST:  7.71mL GADAVIST GADOBUTROL 1 MMOL/ML IV SOLN COMPARISON:  CT of earlier today. FINDINGS: Mild to moderate motion degradation. The MRCP images are especially motion limited. Lower chest: Normal heart size without pericardial or pleural effusion.  Moderate hiatal hernia. Hepatobiliary: Normal liver. Multiple small gallstones. Gallbladder distension 11.1 cm . No specific evidence of acute cholecystitis. The intrahepatic ducts are upper normal for age. The common duct measures maximally 7 mm on 22/2, within normal limits for age. Given above motion degradation, no choledocholithiasis identified. Pancreas: Moderate peripancreatic edema, most significant about the head and uncinate process. Example 21/4. No duct dilatation or acute inflammation. Spleen:  Normal in size, without focal abnormality. Adrenals/Urinary Tract: Normal adrenal glands. Interpolar left renal 8 mm cyst. Other renal lesions are too small to characterize. No  hydronephrosis. Stomach/Bowel: Normal abdominal bowel loops. Vascular/Lymphatic: Aortic atherosclerosis. IVC filter. Patent portal and splenic veins. No retroperitoneal or retrocrural adenopathy. Other:  No ascites. Musculoskeletal: Lumbar spine fixation. Convex left lumbar spine curvature. IMPRESSION: 1. Mild to moderately motion degraded exam. 2. Non complicated pancreatitis. 3. Cholelithiasis with nonspecific gallbladder distension. No biliary duct dilatation or choledocholithiasis. 4. Moderate hiatal hernia. 5.  Aortic Atherosclerosis (ICD10-I70.0). Electronically Signed   By: Abigail Miyamoto M.D.   On: 10/14/2021 15:27    Anti-infectives (From admission, onward)    None        Assessment/Plan Gallstone pancreatitis - CT scan with acute pancreatitis. Cholelithiasis noted on CT and MRCP. There was no evidence of Choledocholithiasis. LFT's are wnl. There was no evidence of Cholecystitis on imaging and WBC was wnl. Patient is with multiple comorbidities including significant cardiac issues. I had a joint discussion with the patient about the indications and risks of Laparoscopic Cholecystectomy. Patient would like to avoid surgery if at all possible understanding the risk of recurrent Pancreatitis (which could cause pseudocyst, abscess, necrotizing pancreatitis etc).There is no evidence of Cholecystitis on imaging as mentioned above or indication for emergency surgery. Discussed with MD who agree's. No plans for surgery as inpatient. Defer tx of pancreatitis to primary team. It appears she is already improving and has been advanced to a [place]. We will sign off. Please call back with any changes, questions or concerns.   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  This care required high  level of medical decision making.   Winferd Humphrey, [facility] Surgery 10/15/2021, 11:06 AM Please see Amion for pager number during day hours 7:00am-4:30pm

## 2021-10-15 NOTE — Consult Note (Addendum)
Cardiology Consultation:   Patient ID: Tina Dominguez MRN: [id number]; DOB: [birth year]  Admit date: 10/14/2021 Date of Consult: 10/15/2021  PCP:  Bonnita Nasuti, MD   [facility] Providers Cardiologist:  Shirlee More, MD     Patient Profile:   Tina Dominguez is a 81 y.o. female with a hx of paroxysmal afib, CAD s/p CABG '20, HLD, factor V leiden mutation/recurrent PEs, CKD stage III, TIA, RCEA '12 who is being seen 10/15/2021 for the evaluation of bradycardia at the request of Dr. Saverio Danker.  History of Present Illness:   Tina Dominguez is a 81 yo female with PMH noted above.  She has been followed by Dr. Bettina Gavia as an outpatient. History of 3v CABG (LIMA-LAD, SVG-OM1, SVG-dRCA) in 2020 with post op atrial fibrillation. Also with known with factor V leiden and recurrent PE's. She has been maintained on Xarelto.   She was last seen in the office on 05/2021 and reported being her in her usual state of health. Was continued on home medications including amiodarone 200mg  daily, norvasc 5 mg daily, Lipitor 40 mg daily, metoprolol 12.5 mg daily.  It was recommended that she stop aspirin, continue Xarelto.  She was doing well maintaining sinus rhythm on medications without any adjustments.  She presented to the ED on 1/19 with complaints of epigastric pain.  Work-up revealed acute pancreatitis with lipase of 190.  She was started on IV fluids and evaluated by surgery.  Cholelithiasis noted on CT and MRCP.  No evidence of choledocholithiasis and LFTs were normal.  It was not recommended that she undergo surgery due to her comorbidities.   She is noted to be bradycardic during her admission.  Cardiology has been consulted in regards to this.  In review of her telemetry heart rates are mostly in the 50-70 range.  Brief episodes of dipping into the 40s with PVCs.  In speaking with patient she has been asymptomatic with this.  Review of office notes reveal that she has history of bradycardia with heart rates  noted in the 49 to mid 50 range most recently.  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[place]) 12/18/2017   Carotid artery occlusion    Carotid stenosis, bilateral 05/26/2011   CKD (chronic kidney disease) stage 3, GFR 30-59 ml/min (HCC) 12/13/2017   Clotting disorder ([place])    Coronary artery disease    Depression    Dysphagia 04/17/2017   Factor V Leiden mutation ([place]) 12/18/2017   GERD (gastroesophageal reflux disease) 05/26/2011   GI bleeding 10/04/2017   Gout 12/13/2017   Hiatal hernia 12/13/2017   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    History of recurrent TIAs 12/13/2017   History of right-sided carotid endarterectomy 10/10/2011   Hyperlipidemia    Hypertension    Hypertensive heart disease with heart failure ([place]) 01/29/2019   Leg pain    with walking   Occlusion and stenosis of carotid artery without mention of cerebral infarction 10/08/2012   Osteoarthritis 05/26/2011   Osteoporosis    PAT (paroxysmal atrial tachycardia) ([place]) 12/05/2018   PE (pulmonary embolism) 05/26/2011   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[place]) 04/17/2017   Reflux    Renal failure 12/13/2017  S/P IVC filter 12/13/2017   Schatzki's ring 12/13/2017   Stroke ([place])    X's 4   Valvular heart disease 12/13/2017    Past Surgical History:  Procedure Laterality Date   ABDOMINAL HYSTERECTOMY  1980   TAH    APPENDECTOMY     BACK SURGERY  06/2011   BREAST SURGERY     x2   CAROTID ENDARTERECTOMY  11/11/2010   Right CEA   CORONARY ARTERY BYPASS GRAFT N/A 12/11/2018   Procedure: CORONARY ARTERY BYPASS GRAFTING (CABG) x three , using left internal mammary artery, and right leg greater saphenous vein harvested endoscopically - SVG to  OM1, SVG to Distal RCA;  Surgeon: Grace Isaac, MD;  Location: [place];  Service: Open Heart Surgery;  Laterality: N/A;   IVC FILTER INSERTION     JOINT REPLACEMENT Right 2005   Right Total Knee   JOINT REPLACEMENT Left 2005   Left Total Knee   LEFT HEART CATH AND CORONARY ANGIOGRAPHY N/A 12/10/2018   Procedure: LEFT HEART CATH AND CORONARY ANGIOGRAPHY;  Surgeon: Jettie Booze, MD;  Location: [place] [facility];  Service: Cardiovascular;  Laterality: N/A;   SHOULDER SURGERY     right   SPINE SURGERY  07-11-11   Decomp. Laminectomy, fusion of L4-5,L5-S1 by Dr. Saintclair Halsted   TEE WITHOUT CARDIOVERSION N/A 12/11/2018   Procedure: TRANSESOPHAGEAL ECHOCARDIOGRAM (TEE);  Surgeon: Grace Isaac, MD;  Location: [place];  Service: Open Heart Surgery;  Laterality: N/A;   TOTAL KNEE ARTHROPLASTY     right  and left     Home Medications:  Prior to Admission medications   Medication Sig Start Date End Date Taking? Authorizing Provider  acetaminophen (TYLENOL) 500 MG tablet Take 500-1,000 mg by mouth every 6 (six) hours as needed for moderate pain.    Yes [provider]  amiodarone (PACERONE) 200 MG tablet TAKE 1 TABLET(200 MG) BY MOUTH DAILY Patient taking differently: Take 200 mg by mouth daily. 06/16/20  Yes Richardo Priest, MD  amLODipine (NORVASC) 5 MG tablet Take 1 tablet (5 mg total) by mouth daily. 08/06/19  Yes Loel Dubonnet, NP  atorvastatin (LIPITOR) 40 MG tablet TAKE 1 TABLET BY MOUTH EVERY DAY AT 6 PM Patient taking differently: Take 40 mg by mouth daily. 12/28/20  Yes Richardo Priest, MD  citalopram (CELEXA) 10 MG tablet Take 10 mg by mouth at bedtime. Take with 40 mg to equal 50 mg at bedtime   Yes [provider]  citalopram (CELEXA) 40 MG tablet Take 40 mg by mouth daily. Take with 10 mg to equal 50 mg at bedtime 11/22/18  Yes [provider]  cloNIDine (CATAPRES) 0.1 MG tablet Take 1 tablet (0.1 mg total) by mouth at bedtime. Take 1 extra tablet daily  if your systolic BP (top number) is greater than 180 and if your heart rate is greater than 100. 01/01/20  Yes Munley, Hilton Cork, MD  famotidine (PEPCID) 20 MG tablet Take 20 mg by mouth daily as needed for heartburn or indigestion.   Yes [provider]  furosemide (LASIX) 20 MG tablet Take 20 mg by mouth daily.   Yes [provider]  levothyroxine (SYNTHROID) 100 MCG tablet Take 100 mcg by mouth daily. 09/06/19  Yes [provider]  linaclotide (LINZESS) 290 MCG CAPS capsule Take 290 mcg by mouth every other day.   Yes [provider]  metoprolol tartrate (LOPRESSOR) 25 MG tablet Metoprolol tartrate 12.5 mg (half tablet) in  the morning daily. Hold for heart rate <100. 09/13/19  Yes Munley, Hilton Cork, MD  mirtazapine (REMERON) 30 MG tablet Take 30 mg by mouth at bedtime.    Yes [provider]  Multiple Vitamin (MULTIVITAMIN) tablet Take 1 tablet by mouth daily.   Yes [provider]  ondansetron (ZOFRAN) 8 MG tablet Take 8 mg by mouth every 8 (eight) hours as needed for nausea/vomiting. 06/30/21  Yes [provider]  Oxycodone HCl 10 MG TABS Take 10 mg by mouth every 6 (six) hours as needed (pain).  11/26/18  Yes [provider]  potassium chloride SA (K-DUR) 20 MEQ tablet Take 1 Tab (47meq) every other day with lasix 04/09/19  Yes Munley, Hilton Cork, MD  XARELTO 20 MG TABS tablet Take 20 mg by mouth daily. 10/10/17  Yes [provider]  pantoprazole (PROTONIX) 40 MG tablet TAKE 1 TABLET(40 MG) BY MOUTH TWICE DAILY Patient not taking: Reported on 10/14/2021 03/18/19   Richardo Priest, MD    Inpatient Medications: Scheduled Meds:  amLODipine  5 mg Oral Daily   atorvastatin  40 mg Oral Daily   citalopram  50 mg Oral QHS   cloNIDine  0.1 mg Oral QHS   furosemide  20 mg Oral Daily   levothyroxine  100 mcg Oral Q0600   linaclotide  290 mcg Oral QODAY   mirtazapine  30 mg Oral QHS   rivaroxaban  20 mg Oral Q supper   Continuous  Infusions:  PRN Meds: acetaminophen **OR** acetaminophen, famotidine, ondansetron **OR** ondansetron (ZOFRAN) IV, oxyCODONE, senna-docusate  Allergies:   No Known Allergies  Social History:   Social History   Socioeconomic History   Marital status: Widowed    Spouse name: Not on file   Number of children: Not on file   Years of education: Not on file   Highest education level: Not on file  Occupational History   Not on file  Tobacco Use   Smoking status: Never   Smokeless tobacco: Never  Vaping Use   Vaping Use: Never used  Substance and Sexual Activity   Alcohol use: No   Drug use: No   Sexual activity: Not on file  Other Topics Concern   Not on file  Social History Narrative   Not on file   Social Determinants of Health   Financial Resource Strain: Not on file  Food Insecurity: Not on file  Transportation Needs: Not on file  Physical Activity: Not on file  Stress: Not on file  Social Connections: Not on file  Intimate Partner Violence: Not on file    Family History:    Family History  Problem Relation Age of Onset   Clotting disorder Mother    Deep vein thrombosis Mother    Diabetes Mother    Heart disease Mother    Hyperlipidemia Mother    Hypertension Mother    Heart disease Father 63   Varicose Veins Father    Stroke Maternal Grandmother    Deep vein thrombosis Sister    Diabetes Sister    Heart disease Brother      ROS:  Please see the history of present illness.   All other ROS reviewed and negative.     Physical Exam/Data:   Vitals:   10/14/21 1803 10/14/21 2104 10/15/21 0604 10/15/21 0821  BP: (!) 136/58 135/65 130/74 (!) 130/52  Pulse: 69 67 68 (!) 52  Resp: 17 20 19 18   Temp: 98.1 F (36.7 C) 98.4 F (36.9  C) 98 F (36.7 C) 98 F (36.7 C)  TempSrc:  Oral Oral Oral  SpO2: 91% 95% 95% 92%  Weight:      Height:        Intake/Output Summary (Last 24 hours) at 10/15/2021 1438 Last data filed at 10/15/2021 9326 Gross per 24 hour   Intake 484.73 ml  Output 700 ml  Net -215.27 ml   Last 3 Weights 10/14/2021 06/07/2021 12/10/2020  Weight (lbs) 160 lb 157 lb 9.6 oz 161 lb 3.2 oz  Weight (kg) 72.576 kg 71.487 kg 73.12 kg     Body mass index is 28.34 kg/m.  General:  Well nourished, well developed, in no acute distress HEENT: normal Neck: no JVD Vascular: No carotid bruits; Distal pulses 2+ bilaterally Cardiac:  normal S1, S2; RRR; no murmur  Lungs:  clear to auscultation bilaterally, no wheezing, rhonchi or rales  Abd: soft, nontender, no hepatomegaly  Ext: no edema Musculoskeletal:  No deformities, BUE and BLE strength normal and equal Skin: warm and dry  Neuro:  CNs 2-12 intact, no focal abnormalities noted Psych:  Normal affect   EKG:  The EKG was personally reviewed and demonstrates:   Telemetry:  Telemetry was personally reviewed and demonstrates: SB-SR 50-70s with PVCs  Relevant CV Studies:  Echo: 10/15/2021  IMPRESSIONS     1. Left ventricular ejection fraction, by estimation, is 60 to 65%. The  left ventricle has normal function. The left ventricle has no regional  wall motion abnormalities. There is mild concentric left ventricular  hypertrophy. Left ventricular diastolic  parameters are consistent with Grade II diastolic dysfunction  (pseudonormalization).   2. Right ventricular systolic function is mildly reduced. The right  ventricular size is normal. There is mildly elevated pulmonary artery  systolic pressure.   3. Left atrial size was moderately dilated.   4. Right atrial size was moderately dilated.   5. The mitral valve is grossly normal. No evidence of mitral valve  regurgitation.   6. Tricuspid valve regurgitation is mild to moderate.   7. The aortic valve was not well visualized. Aortic valve regurgitation  is not visualized.   8. The inferior vena cava is normal in size with <50% respiratory  variability, suggesting right atrial pressure of 8 mmHg.   Comparison(s): No prior  Echocardiogram.   FINDINGS   Left Ventricle: Left ventricular ejection fraction, by estimation, is 60  to 65%. The left ventricle has normal function. The left ventricle has no  regional wall motion abnormalities. The left ventricular internal cavity  size was normal in size. There is   mild concentric left ventricular hypertrophy. Left ventricular diastolic  parameters are consistent with Grade II diastolic dysfunction  (pseudonormalization).   Right Ventricle: The right ventricular size is normal. No increase in  right ventricular wall thickness. Right ventricular systolic function is  mildly reduced. There is mildly elevated pulmonary artery systolic  pressure. The tricuspid regurgitant velocity   is 2.88 m/s, and with an assumed right atrial pressure of 8 mmHg, the  estimated right ventricular systolic pressure is 71.2 mmHg.   Left Atrium: Left atrial size was moderately dilated.   Right Atrium: Right atrial size was moderately dilated.   Pericardium: There is no evidence of pericardial effusion.   Mitral Valve: The mitral valve is grossly normal. There is mild  calcification of the mitral valve leaflet(s). No evidence of mitral valve  regurgitation.   Tricuspid Valve: The tricuspid valve is not well visualized. Tricuspid  valve regurgitation is  mild to moderate.   Aortic Valve: The aortic valve was not well visualized. Aortic valve  regurgitation is not visualized. Aortic valve peak gradient measures 9.6  mmHg.   Pulmonic Valve: The pulmonic valve was not well visualized. Pulmonic valve  regurgitation is not visualized.   Aorta: The aortic root and ascending aorta are structurally normal, with  no evidence of dilitation.   Venous: The inferior vena cava is normal in size with less than 50%  respiratory variability, suggesting right atrial pressure of 8 mmHg.   IAS/Shunts: No atrial level shunt detected by color flow Doppler.   Laboratory Data:  High Sensitivity  Troponin:   Recent Labs  Lab 10/14/21 0851 10/14/21 1033  TROPONINIHS 9 17     Chemistry Recent Labs  Lab 10/14/21 0851 10/14/21 1131 10/15/21 0416 10/15/21 0654  NA 139  --  139  --   K 3.9  --  3.7  --   CL 102  --  102  --   CO2 27  --  31  --   GLUCOSE 116*  --  109*  --   BUN 16  --  12  --   CREATININE 1.12*  --  1.02*  --   CALCIUM 9.0  --  8.7*  --   MG  --  1.4*  --  1.6*  GFRNONAA 50*  --  56*  --   ANIONGAP 10  --  6  --     Recent Labs  Lab 10/14/21 0851 10/15/21 0416  PROT 6.8 5.9*  ALBUMIN 3.5 2.9*  AST 21 17  ALT 9 10  ALKPHOS 81 72  BILITOT 0.6 0.4   Lipids  Recent Labs  Lab 10/14/21 1131  CHOL 118  TRIG 96  HDL 58  LDLCALC 41  CHOLHDL 2.0    Hematology Recent Labs  Lab 10/14/21 0851 10/15/21 0416  WBC 9.4 9.4  RBC 4.04 3.74*  HGB 12.1 10.8*  HCT 38.0 34.4*  MCV 94.1 92.0  MCH 30.0 28.9  MCHC 31.8 31.4  RDW 13.1 13.0  PLT 329 314   Thyroid No results for input(s): TSH, FREET4 in the last 168 hours.  BNPNo results for input(s): BNP, PROBNP in the last 168 hours.  DDimer No results for input(s): DDIMER in the last 168 hours.   Radiology/Studies:  CT ABDOMEN PELVIS W CONTRAST  Result Date: 10/14/2021 CLINICAL DATA:  Lower abdominal pain with nausea and vomiting. EXAM: CT ABDOMEN AND PELVIS WITH CONTRAST TECHNIQUE: Multidetector CT imaging of the abdomen and pelvis was performed using the standard protocol following bolus administration of intravenous contrast. RADIATION DOSE REDUCTION: This exam was performed according to the departmental dose-optimization program which includes automated exposure control, adjustment of the mA and/or kV according to patient size and/or use of iterative reconstruction technique. CONTRAST:  158mL OMNIPAQUE IOHEXOL 300 MG/ML  SOLN COMPARISON:  CT abdomen pelvis dated September 16, 2017. FINDINGS: Lower chest: No acute abnormality.  Minimal bibasilar atelectasis. Hepatobiliary: No focal liver abnormality.  Multiple tiny gallstones again noted. No gallbladder wall thickening. Mild central intrahepatic and common bile duct dilatation to the ampulla. Pancreas: Mild heterogeneity of the pancreatic head with surrounding inflammatory change and fluid. No ductal dilatation. Spleen: Normal in size without focal abnormality. Adrenals/Urinary Tract: Adrenal glands are unremarkable. Unchanged 1.0 cm simple cyst in the left kidney. Other subcentimeter low-density lesions in both kidneys remain too small to characterize. No renal calculi. Mild right hydroureteronephrosis. The bladder is unremarkable. Stomach/Bowel: Unchanged moderate  hiatal hernia. The stomach is otherwise within normal limits. No bowel wall thickening, distention, or surrounding inflammatory changes. Sigmoid colonic diverticulosis. History of prior appendectomy. Vascular/Lymphatic: Aortoiliac atherosclerotic vascular disease. 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 Unchanged IVC filter. No enlarged abdominal or pelvic lymph nodes. Reproductive: Status post hysterectomy. No adnexal masses. Other: No abdominal wall hernia or abnormality. No abdominopelvic ascites. Musculoskeletal: No acute or significant osseous findings. Prior lumbosacral fusion. Chronic lower thoracic compression deformities. IMPRESSION: 1. Mild heterogeneity of the pancreatic head with surrounding inflammatory change and fluid, consistent with acute pancreatitis. No peripancreatic fluid collection. 2. Mild central intrahepatic and common bile duct dilatation to the ampulla without obstructing lesion. This may be reactive to underlying pancreatitis. Correlate with LFTs. 3. Mild right hydroureteronephrosis without obstructing calculus. 4. 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 5. Unchanged cholelithiasis. 6. Aortic Atherosclerosis (ICD10-I70.0). Electronically Signed   By: Titus Dubin M.D.   On: 10/14/2021 11:12   DG  Chest Port 1 View  Result Date: 10/14/2021 CLINICAL DATA:  Chest pain EXAM: PORTABLE CHEST 1 VIEW COMPARISON:  06/12/2020 FINDINGS: The heart size and mediastinal contours are within normal limits for technique. Both lungs are clear. No pleural effusion or pneumothorax. The visualized skeletal structures are unremarkable. IMPRESSION: No acute process in the chest. Electronically Signed   By: Macy Mis M.D.   On: 10/14/2021 09:08   MR ABDOMEN MRCP W WO CONTAST  Result Date: 10/14/2021 CLINICAL DATA:  Pancreatitis suspected. Evaluate for cholelithiasis. EXAM: MRI ABDOMEN WITHOUT AND WITH CONTRAST (INCLUDING MRCP) TECHNIQUE: Multiplanar multisequence MR imaging of the abdomen was performed both before and after the administration of intravenous contrast. Heavily T2-weighted images of the biliary and pancreatic ducts were obtained, and three-dimensional MRCP images were rendered by post processing. CONTRAST:  7.34mL GADAVIST GADOBUTROL 1 MMOL/ML IV SOLN COMPARISON:  CT of earlier today. FINDINGS: Mild to moderate motion degradation. The MRCP images are especially motion limited. Lower chest: Normal heart size without pericardial or pleural effusion. Moderate hiatal hernia. Hepatobiliary: Normal liver. Multiple small gallstones. Gallbladder distension 11.1 cm . No specific evidence of acute cholecystitis. The intrahepatic ducts are upper normal for age. The common duct measures maximally 7 mm on 22/2, within normal limits for age. Given above motion degradation, no choledocholithiasis identified. Pancreas: Moderate peripancreatic edema, most significant about the head and uncinate process. Example 21/4. No duct dilatation or acute inflammation. Spleen:  Normal in size, without focal abnormality. Adrenals/Urinary Tract: Normal adrenal glands. Interpolar left renal 8 mm cyst. Other renal lesions are too small to characterize. No hydronephrosis. Stomach/Bowel: Normal abdominal bowel loops. Vascular/Lymphatic:  Aortic atherosclerosis. IVC filter. Patent portal and splenic veins. No retroperitoneal or retrocrural adenopathy. Other:  No ascites. Musculoskeletal: Lumbar spine fixation. Convex left lumbar spine curvature. IMPRESSION: 1. Mild to moderately motion degraded exam. 2. Non complicated pancreatitis. 3. Cholelithiasis with nonspecific gallbladder distension. No biliary duct dilatation or choledocholithiasis. 4. Moderate hiatal hernia. 5.  Aortic Atherosclerosis (ICD10-I70.0). Electronically Signed   By: Abigail Miyamoto M.D.   On: 10/14/2021 15:27     Assessment and Plan:   Tina Dominguez is a 81 y.o. female with a hx of paroxysmal afib, CAD s/p CABG '20, HLD, factor V leiden mutation/recurrent PEs, CKD stage III, TIA who is being seen 10/15/2021 for the evaluation of bradycardia at the request of Dr. Saverio Danker.  Bradycardia: In review of chart her heart rates have run mostly in the 50 range. Has  been maintained on amiodarone 200mg  daily along with metoprolol 12.5mg  daily. Telemetry without concerning arrhythmia or HB noted. I would favor continuing current doses of medications as to maintain sinus rhythm. She overall has been asymptomatic with heart rates, no syncope or dizziness.  Pancreatitis: Cholelithiasis noted on CT and MRCP.  No evidence of choledocholithiasis and LFTs were normal.  -- seen by gen surgery, recommendations to avoid surgery if possible  Paroxysmal Afib: maintaining SR -- continue metoprolol 12.5mg  daily, amiodarone 200mg  daily -- Xarelto 20mg  daily   CAD s/p 3v CABG: no anginal symptoms -- continue statin, BB  Factor V Leiden/Hx of PE's: on Xarelto  Risk Assessment/Risk Scores:     CHA2DS2-VASc Score = 8   This indicates a 10.8% annual risk of stroke. The patient's score is based upon: CHF History: 1 HTN History: 1 Diabetes History: 0 Stroke History: 2 Vascular Disease History: 1 Age Score: 2 Gender Score: 1    For questions or updates, please contact [place] Please consult [email or web address] for contact info under    Signed, Reino Bellis, NP  10/15/2021 2:38 PM  Patient seen and examined.  I agree with findings as noted by L Mancel Bale above Pt is a 81 yo wth hx of CAD, PAF, PE with Factor V Leiden.  Asked to see re bradycardia   Review of records HR has been in 50s (SB )  Pt on metroprolol 12.5 daily and amiodarone 200 mg daily    She is asymptomatic   No dizziness  No CP    On exam Neck  JVP is normal Lungs are CTA Cardiac RRR  N oS3   No murmur Abd is supple    Ext are without edema   I have seen no signficant pauses   No evidence of heart block I would recomm that she stay on same regimen   No evidence of hemodynamic instability  Will be available as needed  Sign off.    Dorris Carnes  MD

## 2021-10-15 NOTE — Progress Notes (Addendum)
°  Transition of Care [facility]) Screening Note   Patient Details  Name: Tina Dominguez Date of Birth: [birth year]   Transition of Care [facility]) CM/SW Contact:    Tom-Johnson, Renea Ee, RN Phone Number: 10/15/2021, 4:56 PM  Patient is from home and lives alone. Admitted for Acute Pancreatitis. No PT followup noted. Transition of Care Department [facility]) has reviewed patient and no TOC needs have been identified at this time. We will continue to monitor patient advancement through interdisciplinary progression rounds. If new patient transition needs arise, please place a TOC consult.

## 2021-10-15 NOTE — Progress Notes (Addendum)
HD#1 SUBJECTIVE:  Patient Summary: Tina Dominguez is a 81 y.o. with a pertinent PMH of Tina Dominguez is a functionally limited 81 y.o. female with a pertinent PMH of DVT/PE (x4), HLD, HTN, CAD s/p CABG, bilateral carotid stenosis s/p right endarterectomy, embolic CVA, CKD (stage 3), clotting disorder, PAF (xerelto), hypertrophic cardiomyopathy, who presented with epigastric pain and admitted for acute pancreatitis.   Overnight Events: None   Interim History: The patient and her son were seen at bedside during rounds this AM. Pt states that her abdominal pain started around 5:30 yesterday AM. She says it comes and goes. It is located in the abdominal region and wraps around the backside. Feels nauseous but has not vomited. Has not eaten much today because she does not like consuming the clear liquids here. There are no other complaints or concerns at this time.   OBJECTIVE:  Vital Signs: Vitals:   10/14/21 1300 10/14/21 1803 10/14/21 2104 10/15/21 0604  BP: (!) 162/64 (!) 136/58 135/65 130/74  Pulse: 65 69 67 68  Resp: 14 17 20 19   Temp:  98.1 F (36.7 C) 98.4 F (36.9 C) 98 F (36.7 C)  TempSrc:   Oral Oral  SpO2: 97% 91% 95% 95%  Weight:      Height:       Supplemental O2: Room Air SpO2: 95 %  Filed Weights   10/14/21 0846  Weight: 72.6 kg     Intake/Output Summary (Last 24 hours) at 10/15/2021 0640 Last data filed at 10/15/2021 0500 Gross per 24 hour  Intake 244.73 ml  Output 1700 ml  Net -1455.27 ml   Net IO Since Admission: -1,455.27 mL [10/15/21 0640]  Physical Exam: Constitutional: well-appearing elderly female lying in bed, in no acute distress HENT: normocephalic atraumatic, mucous membranes moist Eyes: conjunctiva non-erythematous Neck: supple Cardiovascular: bradycardic and rhythm, no m/r/g Pulmonary/Chest: normal work of breathing on room air, lungs clear to auscultation bilaterally Abdominal: soft, epigastric and right upper quadrant pain,  non-distended MSK: normal bulk and tone, trace edema to lower extremities bilaterally, TTP to left lower extremity Neurological: alert & oriented x 3 Skin: warm and dry Psych: Normal mood and behavior  Patient Lines/Drains/Airways Status     Active Line/Drains/Airways     Name Placement date Placement time Site Days   Peripheral IV 10/14/21 20 G 1" Right Antecubital 10/14/21  0805  Antecubital  1   Incision (Closed) 12/11/18 Chest Other (Comment) 12/11/18  0731  -- 1039   Incision (Closed) 12/11/18 Leg Right 12/11/18  0731  -- 1039            Pertinent Labs: CBC Latest Ref Rng & Units 10/15/2021 10/14/2021 12/13/2019  WBC 4.0 - 10.5 K/uL 9.4 9.4 8.1  Hemoglobin 12.0 - 15.0 g/dL 10.8(L) 12.1 12.9  Hematocrit 36.0 - 46.0 % 34.4(L) 38.0 38.8  Platelets 150 - 400 K/uL 314 329 382    CMP Latest Ref Rng & Units 10/15/2021 10/14/2021 12/13/2019  Glucose 70 - 99 mg/dL 109(H) 116(H) 96  BUN 8 - 23 mg/dL 12 16 24   Creatinine 0.44 - 1.00 mg/dL 1.02(H) 1.12(H) 1.09(H)  Sodium 135 - 145 mmol/L 139 139 143  Potassium 3.5 - 5.1 mmol/L 3.7 3.9 4.2  Chloride 98 - 111 mmol/L 102 102 103  CO2 22 - 32 mmol/L 31 27 26   Calcium 8.9 - 10.3 mg/dL 8.7(L) 9.0 9.2  Total Protein 6.5 - 8.1 g/dL 5.9(L) 6.8 6.9  Total Bilirubin 0.3 - 1.2 mg/dL 0.4 0.6  0.2  Alkaline Phos 38 - 126 U/L 72 81 106  AST 15 - 41 U/L 17 21 23   ALT 0 - 44 U/L 10 9 9     Recent Labs    10/15/21 0618  GLUCAP 155*     Pertinent Imaging: CT ABDOMEN PELVIS W CONTRAST  Result Date: 10/14/2021 CLINICAL DATA:  Lower abdominal pain with nausea and vomiting. EXAM: CT ABDOMEN AND PELVIS WITH CONTRAST TECHNIQUE: Multidetector CT imaging of the abdomen and pelvis was performed using the standard protocol following bolus administration of intravenous contrast. RADIATION DOSE REDUCTION: This exam was performed according to the departmental dose-optimization program which includes automated exposure control, adjustment of the mA and/or kV  according to patient size and/or use of iterative reconstruction technique. CONTRAST:  143mL OMNIPAQUE IOHEXOL 300 MG/ML  SOLN COMPARISON:  CT abdomen pelvis dated September 16, 2017. FINDINGS: Lower chest: No acute abnormality.  Minimal bibasilar atelectasis. Hepatobiliary: No focal liver abnormality. Multiple tiny gallstones again noted. No gallbladder wall thickening. Mild central intrahepatic and common bile duct dilatation to the ampulla. Pancreas: Mild heterogeneity of the pancreatic head with surrounding inflammatory change and fluid. No ductal dilatation. Spleen: Normal in size without focal abnormality. Adrenals/Urinary Tract: Adrenal glands are unremarkable. Unchanged 1.0 cm simple cyst in the left kidney. Other subcentimeter low-density lesions in both kidneys remain too small to characterize. No renal calculi. Mild right hydroureteronephrosis. The bladder is unremarkable. Stomach/Bowel: Unchanged moderate hiatal hernia. The stomach is otherwise within normal limits. No bowel wall thickening, distention, or surrounding inflammatory changes. Sigmoid colonic diverticulosis. History of prior appendectomy. Vascular/Lymphatic: Aortoiliac atherosclerotic vascular disease. 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 Unchanged IVC filter. No enlarged abdominal or pelvic lymph nodes. Reproductive: Status post hysterectomy. No adnexal masses. Other: No abdominal wall hernia or abnormality. No abdominopelvic ascites. Musculoskeletal: No acute or significant osseous findings. Prior lumbosacral fusion. Chronic lower thoracic compression deformities. IMPRESSION: 1. Mild heterogeneity of the pancreatic head with surrounding inflammatory change and fluid, consistent with acute pancreatitis. No peripancreatic fluid collection. 2. Mild central intrahepatic and common bile duct dilatation to the ampulla without obstructing lesion. This may be reactive to underlying  pancreatitis. Correlate with LFTs. 3. Mild right hydroureteronephrosis without obstructing calculus. 4. 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 5. Unchanged cholelithiasis. 6. Aortic Atherosclerosis (ICD10-I70.0). Electronically Signed   By: Titus Dubin M.D.   On: 10/14/2021 11:12   DG Chest Port 1 View  Result Date: 10/14/2021 CLINICAL DATA:  Chest pain EXAM: PORTABLE CHEST 1 VIEW COMPARISON:  06/12/2020 FINDINGS: The heart size and mediastinal contours are within normal limits for technique. Both lungs are clear. No pleural effusion or pneumothorax. The visualized skeletal structures are unremarkable. IMPRESSION: No acute process in the chest. Electronically Signed   By: Macy Mis M.D.   On: 10/14/2021 09:08   MR ABDOMEN MRCP W WO CONTAST  Result Date: 10/14/2021 CLINICAL DATA:  Pancreatitis suspected. Evaluate for cholelithiasis. EXAM: MRI ABDOMEN WITHOUT AND WITH CONTRAST (INCLUDING MRCP) TECHNIQUE: Multiplanar multisequence MR imaging of the abdomen was performed both before and after the administration of intravenous contrast. Heavily T2-weighted images of the biliary and pancreatic ducts were obtained, and three-dimensional MRCP images were rendered by post processing. CONTRAST:  7.31mL GADAVIST GADOBUTROL 1 MMOL/ML IV SOLN COMPARISON:  CT of earlier today. FINDINGS: Mild to moderate motion degradation. The MRCP images are especially motion limited. Lower chest: Normal heart size without pericardial or pleural effusion. Moderate hiatal hernia.  Hepatobiliary: Normal liver. Multiple small gallstones. Gallbladder distension 11.1 cm . No specific evidence of acute cholecystitis. The intrahepatic ducts are upper normal for age. The common duct measures maximally 7 mm on 22/2, within normal limits for age. Given above motion degradation, no choledocholithiasis identified. Pancreas: Moderate peripancreatic edema, most significant about the head and uncinate process. Example 21/4. No  duct dilatation or acute inflammation. Spleen:  Normal in size, without focal abnormality. Adrenals/Urinary Tract: Normal adrenal glands. Interpolar left renal 8 mm cyst. Other renal lesions are too small to characterize. No hydronephrosis. Stomach/Bowel: Normal abdominal bowel loops. Vascular/Lymphatic: Aortic atherosclerosis. IVC filter. Patent portal and splenic veins. No retroperitoneal or retrocrural adenopathy. Other:  No ascites. Musculoskeletal: Lumbar spine fixation. Convex left lumbar spine curvature. IMPRESSION: 1. Mild to moderately motion degraded exam. 2. Non complicated pancreatitis. 3. Cholelithiasis with nonspecific gallbladder distension. No biliary duct dilatation or choledocholithiasis. 4. Moderate hiatal hernia. 5.  Aortic Atherosclerosis (ICD10-I70.0). Electronically Signed   By: Abigail Miyamoto M.D.   On: 10/14/2021 15:27    ASSESSMENT/PLAN:  Assessment: Principal Problem:   Acute pancreatitis   Tina Dominguez is a 81 y.o. with a pertinent PMH of Tina Dominguez is a functionally limited 81 y.o. female with a pertinent PMH of DVT/PE (x4), HLD, HTN, CAD s/p CABG, bilateral carotid stenosis s/p right endarterectomy, embolic CVA, CKD (stage 3), clotting disorder, PAF (xerelto), hypertrophic cardiomyopathy, who presented with epigastric pain and admitted for gallstone pancreatitis.  Plan: #Gallstone pancreatitis CT showed pancreatic head with surrounding inflammatory change and fluid. MRCP completed yesterday and showed cholelithiasis with no biliary duct dilatation of choledocholithiasis. Labs today showed WBC stable at 9.4, Calcium at 8.7, TG, and LFTs remain within normal limits.  Patient does not drink alcohol.  Will try advancing diet as tolerated. Spoke with general surgery for surgical management with cholecystectomy.  CT yesterday showed moderate celiac and severe SMA stenoses at their origin, pain could also be from ischemia. Will follow-up on lactate. - Surgery consulted, no  plans for surgery - Advance diet as tolerated - Encourage oral fluid intake and can give modest IV fluids as needed - Oxycodone for pain management - Zofran for nausea - f/u on lactate - Repeat BMP, CBC tomorrow AM   #New LBBB on EKG: #Paroxysmal Atrial Fibrillation #History of DVT/ PE, home medication Xarelto, s/p IVC filter #History of embolic CVA Negative troponins. HR in 50s today. Holding Amiodarone and metoprolol with bradycardia. - TTE pending - Consulting cardiology for recommendations on medication management - Continuing Xarelto 20 mg qd   #CAD s/p CABG: - Cont atorvastatin, furosemide - Holding metoprolol - Strict INO, daily weights - repeat Mg, BMP tomorrow AM   #Hypothyroidism: - Cont Levo    Best Practice: Diet: Cardiac diet (low fat) IVF: Fluids: None, Rate: None VTE: rivaroxaban (XARELTO) tablet 20 mg Start: 10/15/21 1700 Code: Full AB: None Therapy Recs: Pending Family Contact: Son, at bedside. DISPO: Anticipated discharge pending medical stability.  Signature: Tina Dominguez, D.O.  Internal Medicine Resident, PGY-1 [facility]  Pager: [phone] 6:40 AM, 10/15/2021   Please contact the on call pager after 5 pm and on weekends at [phone].

## 2021-10-15 NOTE — Evaluation (Signed)
Physical Therapy Evaluation Patient Details Name: Tina Dominguez MRN: [id number] DOB: [birth year] Today's Date: 10/15/2021  History of Present Illness  81 y.o. female presents to [facility] on 10/14/2021 with epigastric pain. CT scan demonstrates evidence of pancreatitis. PMH includes DVT/PE (x4), HLD, HTN, CAD s/p CABG, bilateral carotid stenosis s/p right endarterectomy, embolic CVA, CKD (stage 3), clotting disorder, PAF (xerelto), hypertrophic cardiomyopathy.  Clinical Impression  Pt presents to PT with deficits in functional mobility, gait, balance, power, endurance, strength. Pt with chronic R knee strength deficits, preparing to have R TKA redo in February. Pt is limited to ambulating short distances at this time due to RLE weakness. Pt will benefit from continued acute PT services to improve activity tolerance and reduce falls risk.       Recommendations for follow up therapy are one component of a multi-disciplinary discharge planning process, led by the attending physician.  Recommendations may be updated based on patient status, additional functional criteria and insurance authorization.  Follow Up Recommendations No PT follow up (pt appears not far from recent baseline, preparing for R TKA redo in February)    Assistance Recommended at Discharge Intermittent Supervision/Assistance  Patient can return home with the following  A little help with walking and/or transfers;Help with stairs or ramp for entrance;Assistance with cooking/housework    Equipment Recommendations None recommended by PT  Recommendations for Other Services       Functional Status Assessment Patient has had a recent decline in their functional status and demonstrates the ability to make significant improvements in function in a reasonable and predictable amount of time.     Precautions / Restrictions Precautions Precautions: Fall Restrictions Weight Bearing Restrictions: No      Mobility  Bed  Mobility Overal bed mobility: Modified Independent                  Transfers Overall transfer level: Needs assistance Equipment used: Rolling walker (2 wheels) Transfers: Sit to/from Stand Sit to Stand: Min guard                Ambulation/Gait Ambulation/Gait assistance: Min guard Gait Distance (Feet): 20 Feet Assistive device: Rolling walker (2 wheels) Gait Pattern/deviations: Step-to pattern Gait velocity: reduced Gait velocity interpretation: <1.31 ft/sec, indicative of household ambulator   General Gait Details: pt with slowed step-to gait, reduced step length bilaterally  Stairs            Wheelchair Mobility    Modified Rankin (Stroke Patients Only)       Balance Overall balance assessment: Needs assistance Sitting-balance support: No upper extremity supported, Feet supported Sitting balance-Leahy Scale: Good     Standing balance support: Bilateral upper extremity supported, Reliant on assistive device for balance Standing balance-Leahy Scale: Poor                               Pertinent Vitals/Pain Pain Assessment Pain Assessment: 0-10 Pain Score: 5  Pain Location: abdomen Pain Descriptors / Indicators: Aching Pain Intervention(s): Monitored during session    Home Living Family/patient expects to be discharged to:: Private residence Living Arrangements: Alone Available Help at Discharge: Family;Available PRN/intermittently Type of Home: Mobile home Home Access: Stairs to enter Entrance Stairs-Rails: Can reach both Entrance Stairs-Number of Steps: 2   Home Layout: One level Home Equipment: Rollator (4 wheels);Cane - quad;BSC/3in1      Prior Function Prior Level of Function : Independent/Modified Independent  Mobility Comments: pt has been limited to ambulating primarily household distances over the last 6 months due to R knee pain and weakness. Prior to this she was independent for community  mobility       Hand Dominance   Dominant Hand: Right    Extremity/Trunk Assessment   Upper Extremity Assessment Upper Extremity Assessment: Overall WFL for tasks assessed    Lower Extremity Assessment Lower Extremity Assessment: Generalized weakness    Cervical / Trunk Assessment Cervical / Trunk Assessment: Kyphotic  Communication   Communication: No difficulties  Cognition Arousal/Alertness: Awake/alert Behavior During Therapy: WFL for tasks assessed/performed Overall Cognitive Status: Within Functional Limits for tasks assessed                                          General Comments General comments (skin integrity, edema, etc.): VSS on RA    Exercises     Assessment/Plan    PT Assessment Patient needs continued PT services  PT Problem List Decreased strength;Decreased activity tolerance;Decreased balance;Decreased mobility;Pain       PT Treatment Interventions DME instruction;Gait training;Stair training;Functional mobility training;Therapeutic activities;Therapeutic exercise;Balance training;Patient/family education    PT Goals (Current goals can be found in the Care Plan section)  Acute Rehab PT Goals Patient Stated Goal: to regain quality of life and move better PT Goal Formulation: With patient Time For Goal Achievement: 10/29/21 Potential to Achieve Goals: Fair    Frequency Min 3X/week     Co-evaluation               AM-PAC PT "6 Clicks" Mobility  Outcome Measure Help needed turning from your back to your side while in a flat bed without using bedrails?: None Help needed moving from lying on your back to sitting on the side of a flat bed without using bedrails?: None Help needed moving to and from a bed to a chair (including a wheelchair)?: A Little Help needed standing up from a chair using your arms (e.g., wheelchair or bedside chair)?: A Little Help needed to walk in hospital room?: A Little Help needed climbing 3-5  steps with a railing? : A Lot 6 Click Score: 19    End of Session   Activity Tolerance: Patient tolerated treatment well Patient left: in chair;with call bell/phone within reach;with chair alarm set Nurse Communication: Mobility status PT Visit Diagnosis: Other abnormalities of gait and mobility (R26.89);Muscle weakness (generalized) (M62.81)    Time: 1517-6160 PT Time Calculation (min) (ACUTE ONLY): 13 min   Charges:   PT Evaluation $PT Eval Low Complexity: 1 Low          Zenaida Niece, PT, DPT Acute Rehabilitation Pager: [phone] Office [phone]   Zenaida Niece 10/15/2021, 12:38 PM

## 2021-10-16 DIAGNOSIS — K859 Acute pancreatitis without necrosis or infection, unspecified: Secondary | ICD-10-CM

## 2021-10-16 LAB — CBC
HCT: 34.8 % — ABNORMAL LOW (ref 36.0–46.0)
Hemoglobin: 11.4 g/dL — ABNORMAL LOW (ref 12.0–15.0)
MCH: 30.1 pg (ref 26.0–34.0)
MCHC: 32.8 g/dL (ref 30.0–36.0)
MCV: 91.8 fL (ref 80.0–100.0)
Platelets: 281 10*3/uL (ref 150–400)
RBC: 3.79 MIL/uL — ABNORMAL LOW (ref 3.87–5.11)
RDW: 13.1 % (ref 11.5–15.5)
WBC: 9.7 10*3/uL (ref 4.0–10.5)
nRBC: 0 % (ref 0.0–0.2)

## 2021-10-16 LAB — BASIC METABOLIC PANEL
Anion gap: 10 (ref 5–15)
BUN: 13 mg/dL (ref 8–23)
CO2: 26 mmol/L (ref 22–32)
Calcium: 8.4 mg/dL — ABNORMAL LOW (ref 8.9–10.3)
Chloride: 100 mmol/L (ref 98–111)
Creatinine, Ser: 1.11 mg/dL — ABNORMAL HIGH (ref 0.44–1.00)
GFR, Estimated: 51 mL/min — ABNORMAL LOW (ref >60)
Glucose, Bld: 105 mg/dL — ABNORMAL HIGH (ref 70–99)
Potassium: 4.3 mmol/L (ref 3.5–5.1)
Sodium: 136 mmol/L (ref 135–145)

## 2021-10-16 LAB — MAGNESIUM: Magnesium: 2.1 mg/dL (ref 1.7–2.4)

## 2021-10-16 MED ORDER — METOPROLOL TARTRATE 12.5 MG HALF TABLET
12.5000 mg | ORAL_TABLET | Freq: Two times a day (BID) | ORAL | Status: DC
  Administered 2021-10-16: 12.5 mg via ORAL
  Filled 2021-10-16: qty 1

## 2021-10-16 MED ORDER — AMIODARONE HCL 200 MG PO TABS
200.0000 mg | ORAL_TABLET | Freq: Every day | ORAL | Status: DC
  Administered 2021-10-16: 200 mg via ORAL
  Filled 2021-10-16: qty 1

## 2021-10-16 NOTE — Plan of Care (Signed)
  Problem: Coping: Goal: Level of anxiety will decrease Outcome: Not Progressing   Problem: Pain Managment: Goal: General experience of comfort will improve Outcome: Not Progressing   

## 2021-10-16 NOTE — Discharge Summary (Signed)
Name: Tina Dominguez MRN: [id number] DOB: 1940/11/27 81 y.o. PCP: Bonnita Nasuti, MD  Date of Admission: 10/14/2021  8:29 AM Date of Discharge: 10/16/21 Attending Physician: Gilles Chiquito, MD  Discharge Diagnosis: 1. Gallstone pancreatitis 2.  Left bundle branch block 3. 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   amiodarone 200 MG tablet Commonly known as: PACERONE TAKE 1 TABLET(200 MG) BY MOUTH DAILY What changed: See the new instructions.   amLODipine 5 MG tablet Commonly known as: NORVASC Take 1 tablet (5 mg total) by mouth daily.   atorvastatin 40 MG tablet Commonly known as: LIPITOR TAKE 1 TABLET BY MOUTH EVERY DAY AT 6 PM What changed:  how much to take how to take this when to take this additional instructions   citalopram 10 MG tablet Commonly known as: CELEXA Take 10 mg by mouth at bedtime. Take with 40 mg to equal 50 mg at bedtime   citalopram 40 MG tablet Commonly known as: CELEXA Take 40 mg by mouth daily. Take with 10 mg to equal 50 mg at bedtime   cloNIDine 0.1 MG tablet Commonly known as: CATAPRES Take 1 tablet (0.1 mg total) by mouth at bedtime. Take 1 extra tablet daily if your systolic BP (top number) is greater than 180 and if your heart rate is greater than 100.   famotidine 20 MG tablet Commonly known as: PEPCID Take 20 mg by mouth daily as needed for heartburn or indigestion.   furosemide 20 MG tablet Commonly known as: LASIX Take 20 mg by mouth daily.   levothyroxine 100 MCG tablet Commonly known as: SYNTHROID Take 100 mcg by  mouth daily.   linaclotide 290 MCG Caps capsule Commonly known as: LINZESS Take 290 mcg by mouth every other day.   metoprolol tartrate 25 MG tablet Commonly known as: LOPRESSOR Metoprolol tartrate 12.5 mg (half tablet) in the morning daily. Hold for heart rate <100.   mirtazapine 30 MG tablet Commonly known as: REMERON Take 30 mg by mouth at bedtime.   multivitamin tablet Take 1 tablet by mouth daily.   ondansetron 8 MG tablet Commonly known as: ZOFRAN Take 8 mg by mouth every 8 (eight) hours as needed for nausea/vomiting.   Oxycodone HCl 10 MG Tabs Take 10 mg by mouth every 6 (six) hours as needed (pain).   pantoprazole 40 MG tablet Commonly known as: PROTONIX TAKE 1 TABLET(40 MG) BY MOUTH TWICE DAILY   potassium chloride SA 20 MEQ tablet Commonly known as: KLOR-CON M Take 1 Tab (74meq) every other day with lasix   Xarelto 20 MG Tabs tablet Generic drug: rivaroxaban Take 20 mg by mouth daily.        Disposition and follow-up:   Ms.Tina Dominguez was discharged from [facility] in Stable condition.  At the hospital follow up visit please address:  #Gallstone pancreatitis Pancreatitis likely secondary to gallstone that passed on its own. Patient discussed risks vs benefits of cholecystectomy with surgical and primary team.  Patient elected to not pursue surgery at this time with other comorbidities.   #New LBBB  on EKG: #Chronic HFpEF: #Bradycardia EKG with no apparent conduction delay, possibly left bundle branch block. TTE with EF 60-65% and no wall motion abnormalities. Cards consulted with recommendations to continue amiodarone and metorpolol.  Labs / imaging needed at time of follow-up: CBC, CMP, EKG  Pending labs/ test needing follow-up: none  Follow-up Appointments:  Follow-up Information     Sur[facility], [place] Follow up.   Specialty: General Surgery Why: As needed Contact information: [address] [place]  [place] [address] [phone]                 Hospital Course by problem list: #Gallstone pancreatitis: Patient presented with acute onset epigastric pain with nausea and shortness of breath.  Patient was noted to be hypertensive, afebrile, and otherwise hemodynamically stable.  Physical exam showed epigastric and right upper quadrant abdominal pain with guarding.  BS elevated at 190, AST ALT within normal limits.  Lipid panel within normal limits triglycerides at 96.  Patient denies consuming alcohol.  MRCP obtained in the ED showed noncomplicated pancreatitis with cholelithiasis, no signs concerning for biliary duct dilatation or choledocholithiasis.  Patient treated with IV fluids, pain medications, and clear liquid diet.  No surgery was consulted for discussion of surgical management with cholecystectomy.  They discussed with patient the benefits of pursuing cholecystectomy versus risk of undergoing surgery with her multiple comorbidities and possibly of recurrance of pancreatitis.  Patient elected not to pursue cholecystectomy at this time, but information given for follow-up if interested with general surgery.    #New LBBB on EKG: #Bradycardia #HFpEF: #CAD s/p CABG: EKG with no apparent conduction delay, possibly left bundle branch block.  With history of prior CAD status post CABG x3, TTE was obtained.  TTE showed EF of 60 to 65% with normal function of the left ventricle and no regional wall motion abnormalities. Patient did have some bradycardia in the 50s and patient was asymptomatic at those times.  Cardiology recommended continuing Amiodarone and metoprolol on discharge. Continued atorvastatin.  #Paroxysmal Atrial Fibrillation #History of DVT/ PE, home medication Xarelto, s/p IVC filter #History of embolic CVA #Unspecified clotting disorder Patient with history of unclassified clotting disorder (prior factor 5 leiden mutation negative) s/p embolic CVA, multiple PE/DVT.  She takes  Xarelto 20 mg for this. Heart rate remained between 50-70 on amio and metop, patient asymptomatic at that time. Cardiology recommended continuing Amiodarone and metoprolol on discharge.  #Hypothyroidism: Continued levothyroxine.  Discharge Exam:   BP (!) 119/45 (BP Location: Left Arm)    Pulse (!) 56    Temp 99.1 F (37.3 C) (Oral)    Resp 18    Ht 5\' 3"  (1.6 m)    Wt 72.8 kg    SpO2 90%    BMI 28.43 kg/m  Discharge exam:  Constitutional: well-appearing, sitting in bed eating breakfast, in no acute distress HENT: normocephalic atraumatic, mucous membranes moist Eyes: conjunctiva non-erythematous Neck: supple Cardiovascular: bradycardic rate and rhythm, no m/r/g Pulmonary/Chest: normal work of breathing on room air, lungs clear to auscultation bilaterally Abdominal: soft, non-tender, non-distended MSK: normal bulk and tone, tenderness to palpation right knee and left lower extremity, trace edema in lower extremities bilaterally Neurological: alert & oriented x 3, 5/5 strength in bilateral upper and lower extremities Skin: warm and dry Psych: normal mood and affect   Pertinent Labs, Studies, and Procedures:  CBC Latest Ref Rng & Units 10/16/2021 10/15/2021 10/14/2021  WBC 4.0 - 10.5 K/uL 9.7 9.4 9.4  Hemoglobin 12.0 -  15.0 g/dL 11.4(L) 10.8(L) 12.1  Hematocrit 36.0 - 46.0 % 34.8(L) 34.4(L) 38.0  Platelets 150 - 400 K/uL 281 314 329    BMP Latest Ref Rng & Units 10/16/2021 10/15/2021 10/14/2021  Glucose 70 - 99 mg/dL 105(H) 109(H) 116(H)  BUN 8 - 23 mg/dL 13 12 16   Creatinine 0.44 - 1.00 mg/dL 1.11(H) 1.02(H) 1.12(H)  BUN/Creat Ratio 12 - 28 - - -  Sodium 135 - 145 mmol/L 136 139 139  Potassium 3.5 - 5.1 mmol/L 4.3 3.7 3.9  Chloride 98 - 111 mmol/L 100 102 102  CO2 22 - 32 mmol/L 26 31 27   Calcium 8.9 - 10.3 mg/dL 8.4(L) 8.7(L) 9.0    CT abd/ pelvis 10/14/21: IMPRESSION: 1. Mild heterogeneity of the pancreatic head with surrounding inflammatory change and fluid, consistent with  acute pancreatitis. No peripancreatic fluid collection. 2. Mild central intrahepatic and common bile duct dilatation to the ampulla without obstructing lesion. This may be reactive to underlying pancreatitis. Correlate with LFTs. 3. Mild right hydroureteronephrosis without obstructing calculus. 4. 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 5. Unchanged cholelithiasis. 6. Aortic Atherosclerosis (ICD10-I70.0).  MRCP 10/14/21: IMPRESSION: 1. Mild to moderately motion degraded exam. 2. Non complicated pancreatitis. 3. Cholelithiasis with nonspecific gallbladder distension. No biliary duct dilatation or choledocholithiasis. 4. Moderate hiatal hernia. 5.  Aortic Atherosclerosis (ICD10-I70.0).  TTE 10/15/21: FINDINGS   Left Ventricle: Left ventricular ejection fraction, by estimation, is 60  to 65%. The left ventricle has normal function. The left ventricle has no  regional wall motion abnormalities. The left ventricular internal cavity  size was normal in size. There is   mild concentric left ventricular hypertrophy. Left ventricular diastolic  parameters are consistent with Grade II diastolic dysfunction.  Discharge Instructions: Discharge Instructions     Diet - low sodium heart healthy   Complete by: As directed    Discharge instructions   Complete by: As directed    Ms. Sigal,  You were recently admitted to [facility] for inflammation of your pancreas from gallstones. You may have pain for the next few days as your body heals.   Continue taking your home medications as your were.  You should seek further medical care if your pain worsens, you are not able to eat or drink.  We recommend that you see your primary care doctor in about a week to make sure that you continue to improve. We are so glad that you are feeling better.  Sincerely, Christiana Fuchs, DO   Increase activity slowly   Complete by: As directed        Signed: Christiana Fuchs,  DO 10/16/2021, 1:36 PM   Pager: [phone]

## 2021-10-16 NOTE — Progress Notes (Incomplete)
HD#2 SUBJECTIVE:  Patient Summary: Tina Dominguez is a 81 y.o. with a pertinent PMH of Tina Dominguez is a functionally limited 81 y.o. female with a pertinent PMH of DVT/PE (x4), HLD, HTN, CAD s/p CABG, bilateral carotid stenosis s/p right endarterectomy, embolic CVA, CKD (stage 3), PAF (xerelto), who presented with epigastric pain and admitted for acute pancreatitis.   Overnight Events: None   Interim History: The patient and her son were seen at bedside during rounds this AM. Pt states that her abdominal pain started around 5:30 yesterday AM. She says it comes and goes. It is located in the abdominal region and wraps around the backside. Feels nauseous but has not vomited. Has not eaten much today because she does not like consuming the clear liquids here. There are no other complaints or concerns at this time.   OBJECTIVE:  Vital Signs: Vitals:   10/15/21 1703 10/15/21 2035 10/16/21 0508 10/16/21 0629  BP: (!) 135/58 (!) 146/56 (!) 134/59   Pulse: (!) 56 (!) 57 (!) 58   Resp: 18 17 17    Temp: 98.4 F (36.9 C) 98.4 F (36.9 C) 98.7 F (37.1 C)   TempSrc: Oral Oral Oral   SpO2: 91% 95% 93%   Weight:    72.8 kg  Height:       Supplemental O2: Room Air SpO2: 93 %  Filed Weights   10/14/21 0846 10/16/21 0629  Weight: 72.6 kg 72.8 kg     Intake/Output Summary (Last 24 hours) at 10/16/2021 0649 Last data filed at 10/15/2021 1750 Gross per 24 hour  Intake 620.14 ml  Output --  Net 620.14 ml    Net IO Since Admission: -715.13 mL [10/16/21 0649]  Physical Exam: Constitutional: well-appearing elderly female lying in bed, in no acute distress HENT: normocephalic atraumatic, mucous membranes moist Eyes: conjunctiva non-erythematous Neck: supple Cardiovascular: bradycardic and rhythm, no m/r/g Pulmonary/Chest: normal work of breathing on room air, lungs clear to auscultation bilaterally Abdominal: soft, epigastric and right upper quadrant pain, non-distended MSK: normal  bulk and tone, trace edema to lower extremities bilaterally, TTP to left lower extremity Neurological: alert & oriented x 3 Skin: warm and dry Psych: Normal mood and behavior  Patient Lines/Drains/Airways Status     Active Line/Drains/Airways     Name Placement date Placement time Site Days   Peripheral IV 10/14/21 20 G 1" Right Antecubital 10/14/21  0805  Antecubital  1   Incision (Closed) 12/11/18 Chest Other (Comment) 12/11/18  0731  -- 1039   Incision (Closed) 12/11/18 Leg Right 12/11/18  0731  -- 1039            Pertinent Labs: CBC Latest Ref Rng & Units 10/16/2021 10/15/2021 10/14/2021  WBC 4.0 - 10.5 K/uL 9.7 9.4 9.4  Hemoglobin 12.0 - 15.0 g/dL 11.4(L) 10.8(L) 12.1  Hematocrit 36.0 - 46.0 % 34.8(L) 34.4(L) 38.0  Platelets 150 - 400 K/uL 281 314 329    CMP Latest Ref Rng & Units 10/16/2021 10/15/2021 10/14/2021  Glucose 70 - 99 mg/dL 105(H) 109(H) 116(H)  BUN 8 - 23 mg/dL 13 12 16   Creatinine 0.44 - 1.00 mg/dL 1.11(H) 1.02(H) 1.12(H)  Sodium 135 - 145 mmol/L 136 139 139  Potassium 3.5 - 5.1 mmol/L 4.3 3.7 3.9  Chloride 98 - 111 mmol/L 100 102 102  CO2 22 - 32 mmol/L 26 31 27   Calcium 8.9 - 10.3 mg/dL 8.4(L) 8.7(L) 9.0  Total Protein 6.5 - 8.1 g/dL - 5.9(L) 6.8  Total Bilirubin 0.3 -  1.2 mg/dL - 0.4 0.6  Alkaline Phos 38 - 126 U/L - 72 81  AST 15 - 41 U/L - 17 21  ALT 0 - 44 U/L - 10 9    Recent Labs    10/15/21 0618  GLUCAP 155*      Pertinent Imaging: ECHOCARDIOGRAM COMPLETE  Result Date: 10/15/2021    ECHOCARDIOGRAM REPORT   Patient Name:   Tina Dominguez Date of Exam: 10/15/2021 Medical Rec #:  [id number]        Height:       63.0 in Accession #:    [id number]       Weight:       160.0 lb Date of Birth:  1940-11-09       BSA:          1.759 m Patient Age:    59 years         BP:           130/52 mmHg Patient Gender: F                HR:           50 bpm. Exam Location:  Inpatient Procedure: 2D Echo Indications:    Heart block  History:        Patient has no  prior history of Echocardiogram examinations.                 Risk Factors:Hypertension.  Sonographer:    Jefferey Pica Referring Phys: [id number] [place]  1. Left ventricular ejection fraction, by estimation, is 60 to 65%. The left ventricle has normal function. The left ventricle has no regional wall motion abnormalities. There is mild concentric left ventricular hypertrophy. Left ventricular diastolic parameters are consistent with Grade II diastolic dysfunction (pseudonormalization).  2. Right ventricular systolic function is mildly reduced. The right ventricular size is normal. There is mildly elevated pulmonary artery systolic pressure.  3. Left atrial size was moderately dilated.  4. Right atrial size was moderately dilated.  5. The mitral valve is grossly normal. No evidence of mitral valve regurgitation.  6. Tricuspid valve regurgitation is mild to moderate.  7. The aortic valve was not well visualized. Aortic valve regurgitation is not visualized.  8. The inferior vena cava is normal in size with <50% respiratory variability, suggesting right atrial pressure of 8 mmHg. Comparison(s): No prior Echocardiogram. FINDINGS  Left Ventricle: Left ventricular ejection fraction, by estimation, is 60 to 65%. The left ventricle has normal function. The left ventricle has no regional wall motion abnormalities. The left ventricular internal cavity size was normal in size. There is  mild concentric left ventricular hypertrophy. Left ventricular diastolic parameters are consistent with Grade II diastolic dysfunction (pseudonormalization). Right Ventricle: The right ventricular size is normal. No increase in right ventricular wall thickness. Right ventricular systolic function is mildly reduced. There is mildly elevated pulmonary artery systolic pressure. The tricuspid regurgitant velocity  is 2.88 m/s, and with an assumed right atrial pressure of 8 mmHg, the estimated right ventricular systolic pressure is  14.4 mmHg. Left Atrium: Left atrial size was moderately dilated. Right Atrium: Right atrial size was moderately dilated. Pericardium: There is no evidence of pericardial effusion. Mitral Valve: The mitral valve is grossly normal. There is mild calcification of the mitral valve leaflet(s). No evidence of mitral valve regurgitation. Tricuspid Valve: The tricuspid valve is not well visualized. Tricuspid valve regurgitation is mild to moderate. Aortic Valve: The aortic valve was not well visualized.  Aortic valve regurgitation is not visualized. Aortic valve peak gradient measures 9.6 mmHg. Pulmonic Valve: The pulmonic valve was not well visualized. Pulmonic valve regurgitation is not visualized. Aorta: The aortic root and ascending aorta are structurally normal, with no evidence of dilitation. Venous: The inferior vena cava is normal in size with less than 50% respiratory variability, suggesting right atrial pressure of 8 mmHg. IAS/Shunts: No atrial level shunt detected by color flow Doppler.  LEFT VENTRICLE PLAX 2D LVIDd:         4.20 cm   Diastology LVIDs:         3.00 cm   LV e' medial:    6.80 cm/s LV PW:         1.20 cm   LV E/e' medial:  16.9 LV IVS:        1.20 cm   LV e' lateral:   10.70 cm/s LVOT diam:     1.90 cm   LV E/e' lateral: 10.7 LV SV:         82 LV SV Index:   47 LVOT Area:     2.84 cm  RIGHT VENTRICLE          IVC RV Basal diam:  3.00 cm  IVC diam: 1.80 cm TAPSE (M-mode): 1.5 cm LEFT ATRIUM             Index        RIGHT ATRIUM           Index LA diam:        4.30 cm 2.44 cm/m   RA Area:     22.90 cm LA Vol (A2C):   80.5 ml 45.77 ml/m  RA Volume:   69.70 ml  39.63 ml/m LA Vol (A4C):   71.6 ml 40.71 ml/m LA Biplane Vol: 77.4 ml 44.01 ml/m  AORTIC VALVE                 PULMONIC VALVE AV Area (Vmax): 2.38 cm     PV Vmax:       0.97 m/s AV Vmax:        155.00 cm/s  PV Peak grad:  3.7 mmHg AV Peak Grad:   9.6 mmHg LVOT Vmax:      130.00 cm/s LVOT Vmean:     78.000 cm/s LVOT VTI:       0.290 m   AORTA Ao Root diam: 3.10 cm Ao Asc diam:  2.80 cm MITRAL VALVE                TRICUSPID VALVE MV Area (PHT): 3.93 cm     TR Peak grad:   33.2 mmHg MV Decel Time: 193 msec     TR Vmax:        288.00 cm/s MV E velocity: 115.00 cm/s MV A velocity: 83.20 cm/s   SHUNTS MV E/A ratio:  1.38         Systemic VTI:  0.29 m                             Systemic Diam: 1.90 cm Phineas Inches Electronically signed by Phineas Inches Signature Date/Time: 10/15/2021/3:30:33 PM    Final     ASSESSMENT/PLAN:  Assessment: Principal Problem:   Acute pancreatitis Active Problems:   Bradycardia   BINDU DOCTER is a 81 y.o. with a pertinent PMH of ADAISHA CAMPISE is a functionally limited 81 y.o. female with a pertinent PMH of  DVT/PE (x4), HLD, HTN, CAD s/p CABG, bilateral carotid stenosis s/p right endarterectomy, embolic CVA, CKD (stage 3), PAF (xerelto), who presented with epigastric pain and admitted for acute pancreatitis.   Plan: #Gallstone pancreatitis CT showed pancreatic head with surrounding inflammatory change and fluid. MRCP completed yesterday and showed cholelithiasis with no biliary duct dilatation of choledocholithiasis. Labs today showed WBC stable at 9.4, Calcium at 8.7, TG, and LFTs remain within normal limits.  Patient does not drink alcohol.  Will try advancing diet as tolerated. Spoke with general surgery for surgical management with cholecystectomy.  CT yesterday showed moderate celiac and severe SMA stenoses at their origin, pain could also be from ischemia. Will follow-up on lactate. - Surgery consulted, no plans for surgery - Advance diet as tolerated - Encourage oral fluid intake and can give modest IV fluids as needed - Oxycodone for pain management - Zofran for nausea - f/u on lactate - Repeat BMP, CBC tomorrow AM   #New LBBB on EKG: #Paroxysmal Atrial Fibrillation #History of DVT/ PE, home medication Xarelto, s/p IVC filter #History of embolic CVA Negative troponins. HR in 50s today.  Holding Amiodarone and metoprolol with bradycardia. - TTE pending - Consulting cardiology for recommendations on medication management - Continuing Xarelto 20 mg qd   #CAD s/p CABG: - Cont atorvastatin, furosemide - Holding metoprolol - Strict INO, daily weights - repeat Mg, BMP tomorrow AM   #Hypothyroidism: - Cont Levo    Best Practice: Diet: Cardiac diet (low fat) IVF: Fluids: None, Rate: None VTE: rivaroxaban (XARELTO) tablet 20 mg Start: 10/15/21 1700 Code: Full AB: None Therapy Recs: Pending Family Contact: Son, at bedside. DISPO: Anticipated discharge pending medical stability.  Signature: Daleen Bo. Maycol Hoying, D.O.  Internal Medicine Resident, PGY-1 [facility]  Pager: [phone] 6:49 AM, 10/16/2021   Please contact the on call pager after 5 pm and on weekends at [phone].

## 2021-10-19 NOTE — Progress Notes (Addendum)
COVID swab appointment: 11-01-21 @ 9:15 AM  COVID Vaccine Completed: Yes x2 Date COVID Vaccine completed:  Has received booster: Yes x2 COVID vaccine manufacturer: [place]      Date of COVID positive in last 90 days: No  PCP - Celedonio Miyamoto, MD (note on chart) Cardiologist - Shirlee More, MD  Cardiac clearance in Epic dated 08-02-21 by Almyra Deforest, PA  Chest x-ray - 10-14-21 Epic EKG - 10-15-21 Epic Stress Test - greater than 2 years, Epic ECHO - 10-15-21 Epic Cardiac Cath - greater than 2 years Epic Pacemaker/ICD device last checked: Spinal Cord Stimulator: Long Term Monitor - 2020 Epic  Sleep Study - Yes, +sleep apnea CPAP - No  Fasting Blood Sugar - N/A Checks Blood Sugar _____ times a day  Blood Thinner Instructions: Xarelto.  Hold x3 days.  Patient is aware. Aspirin Instructions: Last Dose:  Activity level:   Difficulty climbing stairs due to joint pain and shortness of breath.  Patient denies chest pain but does have shortness of breath with exertion.  Patient lives alone and is able to perform ADLs. .    Anesthesia review:  CAD, carotid stenosis, HTN, CKD.  Hx of stroke, hx of MI and CABG.  Afib.   Clotting disorder (Factor V Leiden Mutation). LBBB  BP elevated at PAT, patient denies headache, chest pain or SOB.  She stated that is not unusual for her BP to be elevated.  She will monitor at home and follow up with PCP if needed.  Patient denies shortness of breath, fever, cough and chest pain at PAT appointment  Patient verbalized understanding of instructions that were given to them at the PAT appointment. Patient was also instructed that they will need to review over the PAT instructions again at home before surgery.

## 2021-10-19 NOTE — Patient Instructions (Addendum)
DUE TO COVID-19 ONLY ONE VISITOR IS ALLOWED TO COME WITH YOU AND STAY IN THE WAITING ROOM ONLY DURING PRE OP AND PROCEDURE.   **NO VISITORS ARE ALLOWED IN THE SHORT STAY AREA OR RECOVERY ROOM!!**  IF YOU WILL BE ADMITTED INTO THE HOSPITAL YOU ARE ALLOWED ONLY TWO SUPPORT PEOPLE DURING VISITATION HOURS ONLY (7 AM -8PM)    Up to two visitors ages 23+ are allowed at one time in a patient's room.  The visitors may rotate out with other people throughout the day.  Additionally, up to two children between the ages of 57 and 77 are allowed and do not count toward the number of allowed visitors.  Children within this age range must be accompanied by an adult visitor.  One adult visitor may remain with the patient overnight and must be in the room by 8 PM.  COVID SWAB TESTING MUST BE COMPLETED ON:  11-01-21 @ 9:15 AM  COME IN THROUGH MAIN ENTRANCE of [facility].  Take a seat in the lobby area to the right as you come in the main entrance.  Call [phone]  and give your name and let them know you are here for COVID testing  You are not required to quarantine, however you are required to wear a well-fitted mask when you are out and around people not in your household.  Hand Hygiene often Do NOT share personal items Notify your provider if you are in close contact with someone who has COVID or you develop fever 100.4 or greater, new onset of sneezing, cough, sore throat, shortness of breath or body aches.        Your procedure is scheduled on: Wednesday 11-03-21    Report to [facility] Main  Entrance     Report to admitting at 5:45 AM   Call this number if you have problems the morning of surgery [phone]   Do not eat food :After Midnight.   May have liquids until 5:30 AM day of surgery  CLEAR LIQUID DIET  Foods Allowed                                                                     Foods Excluded  Water, Black Coffee (no milk/no creamer) and tea, regular and decaf                               liquids that you cannot  Plain Jell-O in any flavor  (No red)                         see through such as: Fruit ices (not with fruit pulp)                                 milk, soups, orange juice  Iced Popsicles (No red)                                    All solid food  Apple juices Sports drinks like Gatorade (No red) Lightly seasoned clear broth or consume(fat free) Sugar    Complete one Ensure drink the morning of surgery at 5:30 AM the day of surgery.     The day of surgery:  Drink ONE (1) Pre-Surgery Clear Ensure the morning of surgery. Drink in one sitting. Do not sip.  This drink was given to you during your hospital  pre-op appointment visit. Nothing else to drink after completing the Pre-Surgery Clear Ensure         If you have questions, please contact your surgeons office.     Oral Hygiene is also important to reduce your risk of infection.                                    Remember - BRUSH YOUR TEETH THE MORNING OF SURGERY WITH YOUR REGULAR TOOTHPASTE   Do NOT smoke after Midnight  Take these medicines the morning of surgery with A SIP OF WATER: Tylenol, Amiodarone, Amlodipine, Atorvastatin, Levothyroxine, Citalopram, Pepcid, Metoprolol  Xarelto - hold 3 days prior to surgery   Stop all vitamins and herbal supplements a week before surgery             You may not have any metal on your body including hair pins, jewelry, and body piercing             Do not wear make-up, lotions, powders, perfumes or deodorant  Do not wear nail polish including gel and S&S, artificial/acrylic nails, or any other type of covering on natural nails including finger and toenails. If you have artificial nails, gel coating, etc. that needs to be removed by a nail salon please have this removed prior to surgery or surgery may need to be canceled/ delayed if the surgeon/ anesthesia feels like they are unable to be safely monitored.   Do not  shave  48 hours prior to surgery.   Do not bring valuables to the hospital. [place].   Contacts, dentures or bridgework may not be worn into surgery.   Bring small overnight bag day of surgery.  Please read over the following fact sheets you were given: IF YOU HAVE QUESTIONS ABOUT YOUR PRE OP INSTRUCTIONS PLEASE CALL [facility] - Preparing for Surgery Before surgery, you can play an important role.  Because skin is not sterile, your skin needs to be as free of germs as possible.  You can reduce the number of germs on your skin by washing with CHG (chlorahexidine gluconate) soap before surgery.  CHG is an antiseptic cleaner which kills germs and bonds with the skin to continue killing germs even after washing. Please DO NOT use if you have an allergy to CHG or antibacterial soaps.  If your skin becomes reddened/irritated stop using the CHG and inform your nurse when you arrive at Short Stay. Do not shave (including legs and underarms) for at least 48 hours prior to the first CHG shower.  You may shave your face/neck.  Please follow these instructions carefully:  1.  Shower with CHG Soap the night before surgery and the  morning of surgery.  2.  If you choose to wash your hair, wash your hair first as usual with your normal  shampoo.  3.  After you shampoo, rinse your hair and body thoroughly to remove the shampoo.  4.  Use CHG as you would any other liquid soap.  You can apply chg directly to the skin and wash.  Gently with a scrungie or clean washcloth.  5.  Apply the CHG Soap to your body ONLY FROM THE NECK DOWN.   Do   not use on face/ open                           Wound or open sores. Avoid contact with eyes, ears mouth and   genitals (private parts).                       Wash face,  Genitals (private parts) with your normal soap.             6.  Wash thoroughly, paying special attention to the area where  your    surgery  will be performed.  7.  Thoroughly rinse your body with warm water from the neck down.  8.  DO NOT shower/wash with your normal soap after using and rinsing off the CHG Soap.                9.  Pat yourself dry with a clean towel.            10.  Wear clean pajamas.            11.  Place clean sheets on your bed the night of your first shower and do not  sleep with pets. Day of Surgery : Do not apply any lotions/deodorants the morning of surgery.  Please wear clean clothes to the hospital/surgery center.  FAILURE TO FOLLOW THESE INSTRUCTIONS MAY RESULT IN THE CANCELLATION OF YOUR SURGERY  PATIENT SIGNATURE_________________________________  NURSE SIGNATURE__________________________________  ________________________________________________________________________   Adam Phenix  An incentive spirometer is a tool that can help keep your lungs clear and active. This tool measures how well you are filling your lungs with each breath. Taking long deep breaths may help reverse or decrease the chance of developing breathing (pulmonary) problems (especially infection) following: A long period of time when you are unable to move or be active. BEFORE THE PROCEDURE  If the spirometer includes an indicator to show your best effort, your nurse or respiratory therapist will set it to a desired goal. If possible, sit up straight or lean slightly forward. Try not to slouch. Hold the incentive spirometer in an upright position. INSTRUCTIONS FOR USE  Sit on the edge of your bed if possible, or sit up as far as you can in bed or on a chair. Hold the incentive spirometer in an upright position. Breathe out normally. Place the mouthpiece in your mouth and seal your lips tightly around it. Breathe in slowly and as deeply as possible, raising the piston or the ball toward the top of the column. Hold your breath for 3-5 seconds or for as long as possible. Allow the piston or ball to  fall to the bottom of the column. Remove the mouthpiece from your mouth and breathe out normally. Rest for a few seconds and repeat Steps 1 through 7 at least 10 times every 1-2 hours when you are awake. Take your time and take a few normal breaths between deep breaths. The spirometer may include an indicator to show your best effort. Use the indicator as a goal to work toward during each repetition. After each set of 10 deep breaths, practice coughing to be sure  your lungs are clear. If you have an incision (the cut made at the time of surgery), support your incision when coughing by placing a pillow or rolled up towels firmly against it. Once you are able to get out of bed, walk around indoors and cough well. You may stop using the incentive spirometer when instructed by your caregiver.  RISKS AND COMPLICATIONS Take your time so you do not get dizzy or light-headed. If you are in pain, you may need to take or ask for pain medication before doing incentive spirometry. It is harder to take a deep breath if you are having pain. AFTER USE Rest and breathe slowly and easily. It can be helpful to keep track of a log of your progress. Your caregiver can provide you with a simple table to help with this. If you are using the spirometer at home, follow these instructions: [place] IF:  You are having difficultly using the spirometer. You have trouble using the spirometer as often as instructed. Your pain medication is not giving enough relief while using the spirometer. You develop fever of 100.5 F (38.1 C) or higher. SEEK IMMEDIATE MEDICAL CARE IF:  You cough up bloody sputum that had not been present before. You develop fever of 102 F (38.9 C) or greater. You develop worsening pain at or near the incision site. MAKE SURE YOU:  Understand these instructions. Will watch your condition. Will get help right away if you are not doing well or get worse. Document Released: 01/23/2007  Document Revised: 12/05/2011 Document Reviewed: 03/26/2007 Exit[employer] Patient Information 2014 [employer], [place].   ________________________________________________________________________  WHAT IS A BLOOD TRANSFUSION? Blood Transfusion Information  A transfusion is the replacement of blood or some of its parts. Blood is made up of multiple cells which provide different functions. Red blood cells carry oxygen and are used for blood loss replacement. White blood cells fight against infection. Platelets control bleeding. Plasma helps clot blood. Other blood products are available for specialized needs, such as hemophilia or other clotting disorders. BEFORE THE TRANSFUSION  Who gives blood for transfusions?  Healthy volunteers who are fully evaluated to make sure their blood is safe. This is blood bank blood. Transfusion therapy is the safest it has ever been in the practice of medicine. Before blood is taken from a donor, a complete history is taken to make sure that person has no history of diseases nor engages in risky social behavior (examples are intravenous drug use or sexual activity with multiple partners). The donor's travel history is screened to minimize risk of transmitting infections, such as malaria. The donated blood is tested for signs of infectious diseases, such as HIV and hepatitis. The blood is then tested to be sure it is compatible with you in order to minimize the chance of a transfusion reaction. If you or a relative donates blood, this is often done in anticipation of surgery and is not appropriate for emergency situations. It takes many days to process the donated blood. RISKS AND COMPLICATIONS Although transfusion therapy is very safe and saves many lives, the main dangers of transfusion include:  Getting an infectious disease. Developing a transfusion reaction. This is an allergic reaction to something in the blood you were given. Every precaution is taken to prevent  this. The decision to have a blood transfusion has been considered carefully by your caregiver before blood is given. Blood is not given unless the benefits outweigh the risks. AFTER THE TRANSFUSION Right after receiving a blood  transfusion, you will usually feel much better and more energetic. This is especially true if your red blood cells have gotten low (anemic). The transfusion raises the level of the red blood cells which carry oxygen, and this usually causes an energy increase. The nurse administering the transfusion will monitor you carefully for complications. HOME CARE INSTRUCTIONS  No special instructions are needed after a transfusion. You may find your energy is better. Speak with your caregiver about any limitations on activity for underlying diseases you may have. SEEK MEDICAL CARE IF:  Your condition is not improving after your transfusion. You develop redness or irritation at the intravenous (IV) site. SEEK IMMEDIATE MEDICAL CARE IF:  Any of the following symptoms occur over the next 12 hours: Shaking chills. You have a temperature by mouth above 102 F (38.9 C), not controlled by medicine. Chest, back, or muscle pain. People around you feel you are not acting correctly or are confused. Shortness of breath or difficulty breathing. Dizziness and fainting. You get a rash or develop hives. You have a decrease in urine output. Your urine turns a dark color or changes to pink, red, or brown. Any of the following symptoms occur over the next 10 days: You have a temperature by mouth above 102 F (38.9 C), not controlled by medicine. Shortness of breath. Weakness after normal activity. The white part of the eye turns yellow (jaundice). You have a decrease in the amount of urine or are urinating less often. Your urine turns a dark color or changes to pink, red, or brown. Document Released: 09/09/2000 Document Revised: 12/05/2011 Document Reviewed: 04/28/2008 [facility] The Patient  Information 2014 [place], [place].  _______________________________________________________________________

## 2021-10-20 NOTE — Progress Notes (Signed)
PT is getting covid test from PCP 11/01/2021. Pt is aware that it must be a PCR test.

## 2021-10-22 ENCOUNTER — Encounter (HOSPITAL_COMMUNITY): Payer: Self-pay

## 2021-10-22 ENCOUNTER — Other Ambulatory Visit: Payer: Self-pay

## 2021-10-22 ENCOUNTER — Encounter (HOSPITAL_COMMUNITY)
Admission: RE | Admit: 2021-10-22 | Discharge: 2021-10-22 | Disposition: A | Discharge status: Home / Self Care | Payer: PPO | Source: Ambulatory Visit | Attending: Orthopedic Surgery | Admitting: Orthopedic Surgery

## 2021-10-22 VITALS — BP 179/74 | HR 58 | Temp 98.3°F | Resp 18 | Ht 63.0 in | Wt 152.6 lb

## 2021-10-22 DIAGNOSIS — M25561 Pain in right knee: Secondary | ICD-10-CM | POA: Diagnosis not present

## 2021-10-22 DIAGNOSIS — Y828 Other medical devices associated with adverse incidents: Secondary | ICD-10-CM | POA: Insufficient documentation

## 2021-10-22 DIAGNOSIS — G8929 Other chronic pain: Secondary | ICD-10-CM | POA: Insufficient documentation

## 2021-10-22 DIAGNOSIS — Z01812 Encounter for preprocedural laboratory examination: Secondary | ICD-10-CM | POA: Insufficient documentation

## 2021-10-22 DIAGNOSIS — Z01818 Encounter for other preprocedural examination: Secondary | ICD-10-CM

## 2021-10-22 DIAGNOSIS — T84093A Other mechanical complication of internal left knee prosthesis, initial encounter: Secondary | ICD-10-CM | POA: Diagnosis not present

## 2021-10-22 HISTORY — DX: Personal history of urinary calculi: Z87.442

## 2021-10-22 HISTORY — DX: Pneumonia, unspecified organism: J18.9

## 2021-10-22 HISTORY — DX: Acute pancreatitis without necrosis or infection, unspecified: K85.90

## 2021-10-22 HISTORY — DX: Acute myocardial infarction, unspecified: I21.9

## 2021-10-22 LAB — CBC WITH DIFFERENTIAL/PLATELET
Abs Immature Granulocytes: 0.09 10*3/uL — ABNORMAL HIGH (ref 0.00–0.07)
Basophils Absolute: 0 10*3/uL (ref 0.0–0.1)
Basophils Relative: 0 %
Eosinophils Absolute: 0.2 10*3/uL (ref 0.0–0.5)
Eosinophils Relative: 1 %
HCT: 42.3 % (ref 36.0–46.0)
Hemoglobin: 13 g/dL (ref 12.0–15.0)
Immature Granulocytes: 1 %
Lymphocytes Relative: 21 %
Lymphs Abs: 2.9 10*3/uL (ref 0.7–4.0)
MCH: 28.8 pg (ref 26.0–34.0)
MCHC: 30.7 g/dL (ref 30.0–36.0)
MCV: 93.6 fL (ref 80.0–100.0)
Monocytes Absolute: 1 10*3/uL (ref 0.1–1.0)
Monocytes Relative: 7 %
Neutro Abs: 9.3 10*3/uL — ABNORMAL HIGH (ref 1.7–7.7)
Neutrophils Relative %: 70 %
Platelets: 434 10*3/uL — ABNORMAL HIGH (ref 150–400)
RBC: 4.52 MIL/uL (ref 3.87–5.11)
RDW: 12.8 % (ref 11.5–15.5)
WBC: 13.4 10*3/uL — ABNORMAL HIGH (ref 4.0–10.5)
nRBC: 0 % (ref 0.0–0.2)

## 2021-10-22 LAB — SURGICAL PCR SCREEN
MRSA, PCR: NEGATIVE
Staphylococcus aureus: POSITIVE — AB

## 2021-10-22 LAB — APTT: aPTT: 28 seconds (ref 24–36)

## 2021-10-22 LAB — PROTIME-INR
INR: 1 (ref 0.8–1.2)
Prothrombin Time: 13.6 seconds (ref 11.4–15.2)

## 2021-10-22 NOTE — Progress Notes (Signed)
PCR results sent to Dr. Zachery Dakins to review.

## 2021-11-01 ENCOUNTER — Encounter (HOSPITAL_COMMUNITY): Payer: PPO

## 2021-11-03 LAB — BPAM RBC
Blood Product Expiration Date: 202303012359
Blood Product Expiration Date: 202303082359
Unit Type and Rh: 5100
Unit Type and Rh: 5100

## 2021-11-03 LAB — TYPE AND SCREEN
ABO/RH(D): O POS
Antibody Screen: NEGATIVE
Unit division: 0
Unit division: 0

## 2021-11-11 ENCOUNTER — Ambulatory Visit: Payer: Self-pay | Admitting: Physician Assistant

## 2021-11-11 NOTE — H&P (Signed)
TOTAL KNEE REVISION ADMISSION H&P   Patient is being admitted for right revision total knee arthroplasty.   Subjective:   Chief Complaint:right knee pain.   HPI: Tina Dominguez, 81 y.o. female, has a history of pain and functional disability in the right knee(s) due to failed previous arthroplasty and patient has failed non-surgical conservative treatments for greater than 12 weeks to include NSAID's and/or analgesics, use of assistive devices, and activity modification. The indications for the revision of the total knee arthroplasty are loosening of one or more components. Onset of symptoms was gradual starting 1 years ago with rapidlly worsening course since that time.  Prior procedures on the right knee(s) include arthroplasty.  Patient currently rates pain in the right knee(s) at 9 out of 10 with activity. There is night pain, worsening of pain with activity and weight bearing, pain that interferes with activities of daily living, pain with passive range of motion, and joint swelling.  Patient has evidence of prosthetic loosening by imaging studies. This condition presents safety issues increasing the risk of falls.  There is no current active infection.       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/2018   Coronary artery disease 12/11/2018   CAD (coronary artery disease) 12/10/2018   Abnormal myocardial perfusion study     PAT (paroxysmal atrial tachycardia) ([place]) 12/05/2018   Pre-op evaluation 12/05/2018   Hyperkalemia 01/19/2018   Chronic anticoagulation 01/19/2018   Chest pain in adult 12/18/2017   Factor V Leiden mutation ([place]) 12/18/2017   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[place]) 12/18/2017   History of recurrent TIAs 12/13/2017   S/P IVC filter 12/13/2017    Schatzki's ring 12/13/2017   Hiatal hernia 12/13/2017   Valvular heart disease 12/13/2017   CKD (chronic kidney disease) stage 3, GFR 30-59 ml/min (HCC) 12/13/2017   Gout 12/13/2017   Anxiety 12/13/2017   Anemia 12/13/2017   Renal failure 12/13/2017   GI bleeding 10/04/2017   Dysphagia 04/17/2017   History of DVT (deep vein thrombosis) 04/17/2017   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   Aftercare following surgery of the circulatory system, NEC 10/14/2013   Occlusion and stenosis of carotid artery without mention of cerebral infarction 10/08/2012   History of right-sided carotid endarterectomy 10/10/2011   Hyperlipidemia 05/26/2011   Stroke ([place]) 05/26/2011   Arthritis 05/26/2011   Depression 05/26/2011   Osteoarthritis 05/26/2011   Carotid stenosis, bilateral 05/26/2011   GERD (gastroesophageal reflux disease) 05/26/2011   PE (pulmonary embolism) 05/26/2011        Past Medical History:  Diagnosis Date   Aftercare following surgery of the circulatory system, NEC 10/14/2013   Anemia 12/13/2017   Anxiety 12/13/2017   Arthritis     CAD (coronary artery disease) 12/10/2018   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[place]) 04/17/2017   Reflux     Renal failure 12/13/2017   S/P IVC filter 12/13/2017   Schatzki's ring 12/13/2017   Stroke ([place])      X's 4   Valvular heart disease 12/13/2017         Past Surgical History:  Procedure Laterality Date   ABDOMINAL HYSTERECTOMY   1980    TAH    APPENDECTOMY       BACK SURGERY   06/2011   BREAST SURGERY        x2   CAROTID ENDARTERECTOMY   11/11/2010    Right CEA   CORONARY ARTERY BYPASS GRAFT N/A 12/11/2018    Procedure: CORONARY ARTERY BYPASS GRAFTING (CABG) x three , using left internal mammary artery, and right leg greater saphenous vein harvested endoscopically - SVG to OM1, SVG to Distal RCA;  Surgeon: Grace Isaac, MD;  Location: [facility];  Service: Open Heart Surgery;  Laterality: N/A;   IVC FILTER INSERTION       JOINT REPLACEMENT Right 2005    Right Total Knee   JOINT REPLACEMENT Left 2005    Left Total Knee   LEFT HEART CATH AND CORONARY ANGIOGRAPHY N/A 12/10/2018    Procedure: LEFT HEART CATH AND CORONARY ANGIOGRAPHY;  Surgeon: Jettie Booze, MD;  Location: [facility];  Service: Cardiovascular;  Laterality: N/A;   SHOULDER SURGERY        right   SPINE SURGERY   07-11-11    Decomp. Laminectomy, fusion of L4-5,L5-S1 by Dr. Saintclair Halsted   TEE WITHOUT CARDIOVERSION N/A 12/11/2018    Procedure: TRANSESOPHAGEAL ECHOCARDIOGRAM (TEE);  Surgeon: Grace Isaac, MD;  Location: [facility];  Service: Open Heart Surgery;  Laterality: N/A;   TOTAL KNEE ARTHROPLASTY        right  and left    No current facility-administered medications for this visit.    No current outpatient medications on file.             Facility-Administered Medications Ordered in Other Visits  Medication Dose  Route Frequency Provider Last Rate Last Admin   acetaminophen (TYLENOL) tablet 650 mg  650 mg Oral Q6H PRN Marianna Payment, MD        Or   acetaminophen (TYLENOL) suppository 650 mg  650 mg Rectal Q6H PRN Marianna Payment, MD       Derrill Memo ON 10/15/2021] amiodarone (PACERONE) tablet 200 mg  200 mg Oral Daily Marianna Payment, MD       Derrill Memo ON 10/15/2021] amLODipine (NORVASC) tablet 5 mg  5 mg Oral Daily Marianna Payment, MD       Derrill Memo ON 10/15/2021] atorvastatin (LIPITOR) tablet 40 mg  40 mg Oral Daily Marianna Payment, MD       citalopram (CELEXA)  tablet 50 mg  50 mg Oral QHS Marianna Payment, MD   50 mg at 10/14/21 2040   [START ON 10/15/2021] cloNIDine (CATAPRES) tablet 0.1 mg  0.1 mg Oral QHS Marianna Payment, MD       famotidine (PEPCID) tablet 20 mg  20 mg Oral Daily PRN Marianna Payment, MD   20 mg at 10/14/21 2042   [START ON 10/15/2021] furosemide (LASIX) tablet 20 mg  20 mg Oral Daily Marianna Payment, MD       lactated ringers infusion   Intravenous Continuous Marianna Payment, MD 75 mL/hr at 10/14/21 1527 New Bag at 10/14/21 1527   [START ON 10/15/2021] levothyroxine (SYNTHROID) tablet 100 mcg  100 mcg Oral Q0600 Marianna Payment, MD       [START ON 10/15/2021] linaclotide Rolan Lipa) capsule 290 mcg  290 mcg Oral Craig Staggers, MD       [START ON 10/15/2021] metoprolol tartrate (LOPRESSOR) tablet 12.5 mg  12.5 mg Oral BID Marianna Payment, MD   12.5 mg at 10/14/21 2041   mirtazapine (REMERON) tablet 30 mg  30 mg Oral Reino Kent, MD   30 mg at 10/14/21 2042   ondansetron (ZOFRAN) tablet 4 mg  4 mg Oral Q6H PRN Marianna Payment, MD        Or   ondansetron [facility]) injection 4 mg  4 mg Intravenous Q6H PRN Marianna Payment, MD       oxyCODONE (Oxy IR/ROXICODONE) immediate release tablet 10 mg  10 mg Oral Q4H PRN Gaylan Gerold, DO   10 mg at 10/14/21 2042   [START ON 10/15/2021] rivaroxaban (XARELTO) tablet 20 mg  20 mg Oral Q supper Marianna Payment, MD       senna-docusate (Senokot-S) tablet 1 tablet  1 tablet Oral QHS PRN Marianna Payment, MD        No Known Allergies  Social History        Tobacco Use   Smoking status: Never   Smokeless tobacco: Never  Substance Use Topics   Alcohol use: No         Family History  Problem Relation Age of Onset   Clotting disorder Mother     Deep vein thrombosis Mother     Diabetes Mother     Heart disease Mother     Hyperlipidemia Mother     Hypertension Mother     Heart disease Father 41   Varicose Veins Father     Stroke Maternal Grandmother     Deep vein thrombosis Sister     Diabetes Sister     Heart disease Brother         Review of Systems  Respiratory:  Positive for shortness of breath.   Cardiovascular:  Positive for chest pain and palpitations.  Gastrointestinal:  Positive for constipation.  All other systems reviewed and are negative.    Objective:   Physical Exam Constitutional:      General: She is not in acute distress.    Appearance: Normal appearance.  HENT:     Head: Normocephalic and atraumatic.  Eyes:     Extraocular Movements: Extraocular movements intact.     Pupils: Pupils are equal, round, and reactive to light.  Cardiovascular:     Rate and Rhythm: Normal rate and regular rhythm.     Pulses: Normal pulses.     Heart sounds: Murmur heard.  Pulmonary:     Effort: Pulmonary effort is normal. No respiratory distress.     Breath sounds: Normal breath  sounds. No wheezing.  Abdominal:     General: Abdomen is flat. Bowel sounds are normal. There is no distension.     Palpations: Abdomen is soft.     Tenderness: There is no abdominal tenderness.  Musculoskeletal:     Cervical back: Normal range of motion and neck supple.     Comments: Examination of the right lower extremity again shows she is neurovascularly intact.  She has good flexion and extension with the knee.  Intact dorsiflexion and plantarflexion with the ankle.  She does have diffuse bilateral lower extremity pitting edema.  Palpable distal pulses.    Lymphadenopathy:      Cervical: No cervical adenopathy.  Skin:    General: Skin is warm and dry.     Findings: No erythema or rash.  Neurological:     General: No focal deficit present.     Mental Status: She is alert and oriented to person, place, and time.  Psychiatric:        Mood and Affect: Mood normal.        Behavior: Behavior normal.      Vital signs in last 24 hours: @VSRANGES @   Labs:   Estimated body mass index is 28.34 kg/m as calculated from the following:   Height as of an earlier encounter on 10/14/21: 5\' 3"  (1.6 m).   Weight as of an earlier encounter on 10/14/21: 72.6 kg.   Imaging Review Plain radiographs demonstrate  no   degenerative joint disease of the right knee(s). The overall alignment is neutral.There is evidence of loosening of the tibial components. The bone quality appears to be good for age and reported activity level. Previous x-rays taken on June 04, 2021 again concerning for tibial tray loosening.  Confirmed on bone scan done on July 12, 2021.         Assessment/Plan:   End stage arthritis, right knee(s) with failed previous arthroplasty.    The patient history, physical examination, clinical judgment of the provider and imaging studies are consistent with end stage degenerative joint disease of the right knee(s), previous total knee arthroplasty. Revision total knee arthroplasty is deemed medically necessary. The treatment options including medical management, injection therapy, arthroscopy and revision arthroplasty were discussed at length. The risks and benefits of revision total knee arthroplasty were presented and reviewed. The risks due to aseptic loosening, infection, stiffness, patella tracking problems, thromboembolic complications and other imponderables were discussed. The patient acknowledged the explanation, agreed to proceed with the plan and consent was signed. Patient is being admitted for inpatient treatment for surgery, pain control, PT, OT,  prophylactic antibiotics, VTE prophylaxis, progressive ambulation and ADL's and discharge planning.The patient is planning to be discharged home with home health services

## 2021-11-11 NOTE — H&P (View-Only) (Signed)
TOTAL KNEE REVISION ADMISSION H&P   Patient is being admitted for right revision total knee arthroplasty.   Subjective:   Chief Complaint:right knee pain.   HPI: Tina Dominguez, 81 y.o. female, has a history of pain and functional disability in the right knee(s) due to failed previous arthroplasty and patient has failed non-surgical conservative treatments for greater than 12 weeks to include NSAID's and/or analgesics, use of assistive devices, and activity modification. The indications for the revision of the total knee arthroplasty are loosening of one or more components. Onset of symptoms was gradual starting 1 years ago with rapidlly worsening course since that time.  Prior procedures on the right knee(s) include arthroplasty.  Patient currently rates pain in the right knee(s) at 9 out of 10 with activity. There is night pain, worsening of pain with activity and weight bearing, pain that interferes with activities of daily living, pain with passive range of motion, and joint swelling.  Patient has evidence of prosthetic loosening by imaging studies. This condition presents safety issues increasing the risk of falls.  There is no current active infection.       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/2018   Coronary artery disease 12/11/2018   CAD (coronary artery disease) 12/10/2018   Abnormal myocardial perfusion study     PAT (paroxysmal atrial tachycardia) ([place]) 12/05/2018   Pre-op evaluation 12/05/2018   Hyperkalemia 01/19/2018   Chronic anticoagulation 01/19/2018   Chest pain in adult 12/18/2017   Factor V Leiden mutation ([place]) 12/18/2017   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[place]) 12/18/2017   History of recurrent TIAs 12/13/2017   S/P IVC filter 12/13/2017    Schatzki's ring 12/13/2017   Hiatal hernia 12/13/2017   Valvular heart disease 12/13/2017   CKD (chronic kidney disease) stage 3, GFR 30-59 ml/min (HCC) 12/13/2017   Gout 12/13/2017   Anxiety 12/13/2017   Anemia 12/13/2017   Renal failure 12/13/2017   GI bleeding 10/04/2017   Dysphagia 04/17/2017   History of DVT (deep vein thrombosis) 04/17/2017   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[place]) 04/17/2017   Aftercare following surgery of the circulatory system, NEC 10/14/2013   Occlusion and stenosis of carotid artery without mention of cerebral infarction 10/08/2012   History of right-sided carotid endarterectomy 10/10/2011   Hyperlipidemia 05/26/2011   Stroke ([place]) 05/26/2011   Arthritis 05/26/2011   Depression 05/26/2011   Osteoarthritis 05/26/2011   Carotid stenosis, bilateral 05/26/2011   GERD (gastroesophageal reflux disease) 05/26/2011   PE (pulmonary embolism) 05/26/2011        Past Medical History:  Diagnosis Date   Aftercare following surgery of the circulatory system, NEC 10/14/2013   Anemia 12/13/2017   Anxiety 12/13/2017   Arthritis     CAD (coronary artery disease) 12/10/2018   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/17/2017   Reflux     Renal failure 12/13/2017   S/P IVC filter 12/13/2017   Schatzki's ring 12/13/2017   Stroke ([place])      X's 4   Valvular heart disease 12/13/2017         Past Surgical History:  Procedure Laterality Date   ABDOMINAL HYSTERECTOMY   1980    TAH    APPENDECTOMY       BACK SURGERY   06/2011   BREAST SURGERY        x2   CAROTID ENDARTERECTOMY   11/11/2010    Right CEA   CORONARY ARTERY BYPASS GRAFT N/A 12/11/2018    Procedure: CORONARY ARTERY BYPASS GRAFTING (CABG) x three , using left internal mammary artery, and right leg greater saphenous vein harvested endoscopically - SVG to OM1, SVG to Distal RCA;  Surgeon: Grace Isaac, MD;  Location: [facility];  Service: Open Heart Surgery;  Laterality: N/A;   IVC FILTER INSERTION       JOINT REPLACEMENT Right 2005    Right Total Knee   JOINT REPLACEMENT Left 2005    Left Total Knee   LEFT HEART CATH AND CORONARY ANGIOGRAPHY N/A 12/10/2018    Procedure: LEFT HEART CATH AND CORONARY ANGIOGRAPHY;  Surgeon: Jettie Booze, MD;  Location: [facility];  Service: Cardiovascular;  Laterality: N/A;   SHOULDER SURGERY        right   SPINE SURGERY   07-11-11    Decomp. Laminectomy, fusion of L4-5,L5-S1 by Dr. Saintclair Halsted   TEE WITHOUT CARDIOVERSION N/A 12/11/2018    Procedure: TRANSESOPHAGEAL ECHOCARDIOGRAM (TEE);  Surgeon: Grace Isaac, MD;  Location: [facility];  Service: Open Heart Surgery;  Laterality: N/A;   TOTAL KNEE ARTHROPLASTY        right  and left    No current facility-administered medications for this visit.    No current outpatient medications on file.             Facility-Administered Medications Ordered in Other Visits  Medication Dose  Route Frequency Provider Last Rate Last Admin   acetaminophen (TYLENOL) tablet 650 mg  650 mg Oral Q6H PRN Marianna Payment, MD        Or   acetaminophen (TYLENOL) suppository 650 mg  650 mg Rectal Q6H PRN Marianna Payment, MD       Derrill Memo ON 10/15/2021] amiodarone (PACERONE) tablet 200 mg  200 mg Oral Daily Marianna Payment, MD       Derrill Memo ON 10/15/2021] amLODipine (NORVASC) tablet 5 mg  5 mg Oral Daily Marianna Payment, MD       Derrill Memo ON 10/15/2021] atorvastatin (LIPITOR) tablet 40 mg  40 mg Oral Daily Marianna Payment, MD       citalopram (CELEXA)  tablet 50 mg  50 mg Oral QHS Marianna Payment, MD   50 mg at 10/14/21 2040   [START ON 10/15/2021] cloNIDine (CATAPRES) tablet 0.1 mg  0.1 mg Oral QHS Marianna Payment, MD       famotidine (PEPCID) tablet 20 mg  20 mg Oral Daily PRN Marianna Payment, MD   20 mg at 10/14/21 2042   [START ON 10/15/2021] furosemide (LASIX) tablet 20 mg  20 mg Oral Daily Marianna Payment, MD       lactated ringers infusion   Intravenous Continuous Marianna Payment, MD 75 mL/hr at 10/14/21 1527 New Bag at 10/14/21 1527   [START ON 10/15/2021] levothyroxine (SYNTHROID) tablet 100 mcg  100 mcg Oral Q0600 Marianna Payment, MD       [START ON 10/15/2021] linaclotide Rolan Lipa) capsule 290 mcg  290 mcg Oral Craig Staggers, MD       [START ON 10/15/2021] metoprolol tartrate (LOPRESSOR) tablet 12.5 mg  12.5 mg Oral BID Marianna Payment, MD   12.5 mg at 10/14/21 2041   mirtazapine (REMERON) tablet 30 mg  30 mg Oral Reino Kent, MD   30 mg at 10/14/21 2042   ondansetron (ZOFRAN) tablet 4 mg  4 mg Oral Q6H PRN Marianna Payment, MD        Or   ondansetron [facility]) injection 4 mg  4 mg Intravenous Q6H PRN Marianna Payment, MD       oxyCODONE (Oxy IR/ROXICODONE) immediate release tablet 10 mg  10 mg Oral Q4H PRN Gaylan Gerold, DO   10 mg at 10/14/21 2042   [START ON 10/15/2021] rivaroxaban (XARELTO) tablet 20 mg  20 mg Oral Q supper Marianna Payment, MD       senna-docusate (Senokot-S) tablet 1 tablet  1 tablet Oral QHS PRN Marianna Payment, MD        No Known Allergies  Social History        Tobacco Use   Smoking status: Never   Smokeless tobacco: Never  Substance Use Topics   Alcohol use: No         Family History  Problem Relation Age of Onset   Clotting disorder Mother     Deep vein thrombosis Mother     Diabetes Mother     Heart disease Mother     Hyperlipidemia Mother     Hypertension Mother     Heart disease Father 60   Varicose Veins Father     Stroke Maternal Grandmother     Deep vein thrombosis Sister     Diabetes Sister     Heart disease Brother         Review of Systems  Respiratory:  Positive for shortness of breath.   Cardiovascular:  Positive for chest pain and palpitations.  Gastrointestinal:  Positive for constipation.  All other systems reviewed and are negative.    Objective:   Physical Exam Constitutional:      General: She is not in acute distress.    Appearance: Normal appearance.  HENT:     Head: Normocephalic and atraumatic.  Eyes:     Extraocular Movements: Extraocular movements intact.     Pupils: Pupils are equal, round, and reactive to light.  Cardiovascular:     Rate and Rhythm: Normal rate and regular rhythm.     Pulses: Normal pulses.     Heart sounds: Murmur heard.  Pulmonary:     Effort: Pulmonary effort is normal. No respiratory distress.     Breath sounds: Normal breath  sounds. No wheezing.  Abdominal:     General: Abdomen is flat. Bowel sounds are normal. There is no distension.     Palpations: Abdomen is soft.     Tenderness: There is no abdominal tenderness.  Musculoskeletal:     Cervical back: Normal range of motion and neck supple.     Comments: Examination of the right lower extremity again shows she is neurovascularly intact.  She has good flexion and extension with the knee.  Intact dorsiflexion and plantarflexion with the ankle.  She does have diffuse bilateral lower extremity pitting edema.  Palpable distal pulses.    Lymphadenopathy:      Cervical: No cervical adenopathy.  Skin:    General: Skin is warm and dry.     Findings: No erythema or rash.  Neurological:     General: No focal deficit present.     Mental Status: She is alert and oriented to person, place, and time.  Psychiatric:        Mood and Affect: Mood normal.        Behavior: Behavior normal.      Vital signs in last 24 hours: @VSRANGES @   Labs:   Estimated body mass index is 28.34 kg/m as calculated from the following:   Height as of an earlier encounter on 10/14/21: 5\' 3"  (1.6 m).   Weight as of an earlier encounter on 10/14/21: 72.6 kg.   Imaging Review Plain radiographs demonstrate  no   degenerative joint disease of the right knee(s). The overall alignment is neutral.There is evidence of loosening of the tibial components. The bone quality appears to be good for age and reported activity level. Previous x-rays taken on June 04, 2021 again concerning for tibial tray loosening.  Confirmed on bone scan done on July 12, 2021.         Assessment/Plan:   End stage arthritis, right knee(s) with failed previous arthroplasty.    The patient history, physical examination, clinical judgment of the provider and imaging studies are consistent with end stage degenerative joint disease of the right knee(s), previous total knee arthroplasty. Revision total knee arthroplasty is deemed medically necessary. The treatment options including medical management, injection therapy, arthroscopy and revision arthroplasty were discussed at length. The risks and benefits of revision total knee arthroplasty were presented and reviewed. The risks due to aseptic loosening, infection, stiffness, patella tracking problems, thromboembolic complications and other imponderables were discussed. The patient acknowledged the explanation, agreed to proceed with the plan and consent was signed. Patient is being admitted for inpatient treatment for surgery, pain control, PT, OT,  prophylactic antibiotics, VTE prophylaxis, progressive ambulation and ADL's and discharge planning.The patient is planning to be discharged home with home health services

## 2021-11-17 NOTE — Progress Notes (Addendum)
COVID swab appointment: 11/29/21 @ 0800  COVID Vaccine Completed: yes x4 Date COVID Vaccine completed: 10/02/19, 10/23/19 Has received booster: 09/15/20, 08/03/21 COVID vaccine manufacturer: [employer]      Date of COVID positive in last 90 days: no  PCP - Donnetta Hutching, MD Cardiologist - Shirlee More, MD  Cardiac clearance 08/02/21 by Almyra Deforest in [place]   Chest x-ray - 10/14/21 Epic EKG - 10/15/21 Epic Stress Test - 12/04/18 Epic ECHO - 10/15/21 Epic Cardiac Cath - 12/10/18 Epic Pacemaker/ICD device last checked: n/a Spinal Cord Stimulator: n/a  Bowel Prep - no  Sleep Study - yes, positive CPAP - no machine per pt preference   Fasting Blood Sugar - n/a Checks Blood Sugar _____ times a day  Blood Thinner Instructions: Xarelto, hold 3 days Aspirin Instructions: Last Dose:  Activity level: Can perform activities of daily living without stopping and without symptoms of chest pain or shortness of breath. No stairs due to knee.     Anesthesia review: stroke, PE, PAT, CAD, HTN, dysphagia, CKD, factor V, CABG, LBBB  Patient denies shortness of breath, fever, cough and chest pain at PAT appointment   Patient verbalized understanding of instructions that were given to them at the PAT appointment. Patient was also instructed that they will need to review over the PAT instructions again at home before surgery.

## 2021-11-18 ENCOUNTER — Encounter (HOSPITAL_COMMUNITY): Payer: Self-pay

## 2021-11-18 ENCOUNTER — Other Ambulatory Visit (HOSPITAL_COMMUNITY): Payer: PPO

## 2021-11-18 ENCOUNTER — Encounter (HOSPITAL_COMMUNITY)
Admission: RE | Admit: 2021-11-18 | Discharge: 2021-11-18 | Disposition: A | Discharge status: Home / Self Care | Payer: PPO | Source: Ambulatory Visit | Attending: Orthopedic Surgery | Admitting: Orthopedic Surgery

## 2021-11-18 ENCOUNTER — Other Ambulatory Visit: Payer: Self-pay

## 2021-11-18 VITALS — BP 148/86 | HR 52 | Temp 98.7°F | Resp 16 | Ht 63.0 in | Wt 160.0 lb

## 2021-11-18 DIAGNOSIS — I10 Essential (primary) hypertension: Secondary | ICD-10-CM | POA: Diagnosis not present

## 2021-11-18 DIAGNOSIS — Z01818 Encounter for other preprocedural examination: Secondary | ICD-10-CM

## 2021-11-18 DIAGNOSIS — Z01812 Encounter for preprocedural laboratory examination: Secondary | ICD-10-CM | POA: Diagnosis present

## 2021-11-18 HISTORY — DX: Hypothyroidism, unspecified: E03.9

## 2021-11-18 LAB — CBC
HCT: 37.3 % (ref 36.0–46.0)
Hemoglobin: 11.3 g/dL — ABNORMAL LOW (ref 12.0–15.0)
MCH: 28.5 pg (ref 26.0–34.0)
MCHC: 30.3 g/dL (ref 30.0–36.0)
MCV: 94 fL (ref 80.0–100.0)
Platelets: 322 10*3/uL (ref 150–400)
RBC: 3.97 MIL/uL (ref 3.87–5.11)
RDW: 13.2 % (ref 11.5–15.5)
WBC: 6.7 10*3/uL (ref 4.0–10.5)
nRBC: 0 % (ref 0.0–0.2)

## 2021-11-18 LAB — BASIC METABOLIC PANEL
Anion gap: 7 (ref 5–15)
BUN: 21 mg/dL (ref 8–23)
CO2: 25 mmol/L (ref 22–32)
Calcium: 8.9 mg/dL (ref 8.9–10.3)
Chloride: 106 mmol/L (ref 98–111)
Creatinine, Ser: 1.22 mg/dL — ABNORMAL HIGH (ref 0.44–1.00)
GFR, Estimated: 45 mL/min — ABNORMAL LOW (ref >60)
Glucose, Bld: 107 mg/dL — ABNORMAL HIGH (ref 70–99)
Potassium: 4.6 mmol/L (ref 3.5–5.1)
Sodium: 138 mmol/L (ref 135–145)

## 2021-11-18 LAB — SURGICAL PCR SCREEN
MRSA, PCR: NEGATIVE
Staphylococcus aureus: POSITIVE — AB

## 2021-11-18 NOTE — Patient Instructions (Addendum)
DUE TO COVID-19 ONLY ONE VISITOR IS ALLOWED TO COME WITH YOU AND STAY IN THE WAITING ROOM ONLY DURING PRE OP AND PROCEDURE.   **NO VISITORS ARE ALLOWED IN THE SHORT STAY AREA OR RECOVERY ROOM!!**  IF YOU WILL BE ADMITTED INTO THE HOSPITAL YOU ARE ALLOWED ONLY TWO SUPPORT PEOPLE DURING VISITATION HOURS ONLY (7 AM -8PM)   The support person(s) must pass our screening, gel in and out, and wear a mask at all times, including in the patients room. Patients must also wear a mask when staff or their support person are in the room. Visitors GUEST BADGE MUST BE WORN VISIBLY  One adult visitor may remain with you overnight and MUST be in the room by 8 P.M.  No visitors under the age of 60. Any visitor under the age of 66 must be accompanied by an adult.    COVID SWAB TESTING MUST BE COMPLETED ON:  11/29/21 @ 8:00 AM   Site: [facility] [place] Enter: Main Entrance have a seat in the waiting area to the right of main entrance (DO NOT [place]!!!!!) Dial: [phone] to alert staff you have arrived  You are not required to quarantine, however you are required to wear a well-fitted mask when you are out and around people not in your household.  Hand Hygiene often Do NOT share personal items Notify your provider if you are in close contact with someone who has COVID or you develop fever 100.4 or greater, new onset of sneezing, cough, sore throat, shortness of breath or body aches.       Your procedure is scheduled on: 12/01/21   Report to [facility] Main Entrance    Report to admitting at 8:45 AM   Call this number if you have problems the morning of surgery [phone]   Do not eat food :After Midnight.   After Midnight you may have the following liquids until 8:30 AM DAY OF SURGERY  Water Black Coffee (sugar ok, NO MILK/CREAM OR CREAMERS)  Tea (sugar ok, NO MILK/CREAM OR CREAMERS) regular and decaf                             Plain  Jell-O (NO RED)                                           Fruit ices (not with fruit pulp, NO RED)                                     Popsicles (NO RED)                                                                  Juice: apple, WHITE grape, WHITE cranberry Sports drinks like Gatorade (NO RED) Clear broth(vegetable,chicken,beef)    The day of surgery:  Drink ONE (1) Pre-Surgery Clear Ensure at 8:30 AM the morning of surgery. Drink in one sitting. Do not sip.  This drink was given to you  during your hospital  pre-op appointment visit. Nothing else to drink after completing the  Pre-Surgery Clear Ensure.          If you have questions, please contact your surgeons office.   FOLLOW BOWEL PREP AND ANY ADDITIONAL PRE OP INSTRUCTIONS YOU RECEIVED FROM YOUR SURGEON'S OFFICE!!!     Oral Hygiene is also important to reduce your risk of infection.                                    Remember - BRUSH YOUR TEETH THE MORNING OF SURGERY WITH YOUR REGULAR TOOTHPASTE    Stop all vitamins and supplements 7 days before surgery.   Stop Xarelto 3 days before surgery.   Take these medicines the morning of surgery with A SIP OF WATER: Tylenol, Amiodarone, Amlodipine, Atorvastatin, Citalopram, Pepcid, Synthroid, Metoprolol, Zofran, Oxycodone              You may not have any metal on your body including hair pins, jewelry, and body piercing             Do not wear make-up, lotions, powders, perfumes, or deodorant  Do not wear nail polish including gel and S&S, artificial/acrylic nails, or any other type of covering on natural nails including finger and toenails. If you have artificial nails, gel coating, etc. that needs to be removed by a nail salon please have this removed prior to surgery or surgery may need to be canceled/ delayed if the surgeon/ anesthesia feels like they are unable to be safely monitored.   Do not shave  48 hours prior to surgery.    Do not bring valuables to the hospital.  [place].   Contacts, dentures or bridgework may not be worn into surgery.   Bring small overnight bag day of surgery.   Special Instructions: Bring a copy of your healthcare power of attorney and living will documents         the day of surgery if you haven't scanned them before.              Please read over the following fact sheets you were given: IF YOU HAVE QUESTIONS ABOUT YOUR PRE-OP INSTRUCTIONS PLEASE CALL [facility] - Preparing for Surgery Before surgery, you can play an important role.  Because skin is not sterile, your skin needs to be as free of germs as possible.  You can reduce the number of germs on your skin by washing with CHG (chlorahexidine gluconate) soap before surgery.  CHG is an antiseptic cleaner which kills germs and bonds with the skin to continue killing germs even after washing. Please DO NOT use if you have an allergy to CHG or antibacterial soaps.  If your skin becomes reddened/irritated stop using the CHG and inform your nurse when you arrive at Short Stay. Do not shave (including legs and underarms) for at least 48 hours prior to the first CHG shower.  You may shave your face/neck.  Please follow these instructions carefully:  1.  Shower with CHG Soap the night before surgery and the  morning of surgery.  2.  If you choose to wash your hair, wash your hair first as usual with your normal  shampoo.  3.  After you shampoo, rinse your  hair and body thoroughly to remove the shampoo.                             4.  Use CHG as you would any other liquid soap.  You can apply chg directly to the skin and wash.  Gently with a scrungie or clean washcloth.  5.  Apply the CHG Soap to your body ONLY FROM THE NECK DOWN.   Do   not use on face/ open                           Wound or open sores. Avoid contact with eyes, ears mouth and   genitals (private parts).                       Wash face,   Genitals (private parts) with your normal soap.             6.  Wash thoroughly, paying special attention to the area where your    surgery  will be performed.  7.  Thoroughly rinse your body with warm water from the neck down.  8.  DO NOT shower/wash with your normal soap after using and rinsing off the CHG Soap.                9.  Pat yourself dry with a clean towel.            10.  Wear clean pajamas.            11.  Place clean sheets on your bed the night of your first shower and do not  sleep with pets. Day of Surgery : Do not apply any lotions/deodorants the morning of surgery.  Please wear clean clothes to the hospital/surgery center.  FAILURE TO FOLLOW THESE INSTRUCTIONS MAY RESULT IN THE CANCELLATION OF YOUR SURGERY  PATIENT SIGNATURE_________________________________  NURSE SIGNATURE__________________________________  ________________________________________________________________________   Adam Phenix  An incentive spirometer is a tool that can help keep your lungs clear and active. This tool measures how well you are filling your lungs with each breath. Taking long deep breaths may help reverse or decrease the chance of developing breathing (pulmonary) problems (especially infection) following: A long period of time when you are unable to move or be active. BEFORE THE PROCEDURE  If the spirometer includes an indicator to show your best effort, your nurse or respiratory therapist will set it to a desired goal. If possible, sit up straight or lean slightly forward. Try not to slouch. Hold the incentive spirometer in an upright position. INSTRUCTIONS FOR USE  Sit on the edge of your bed if possible, or sit up as far as you can in bed or on a chair. Hold the incentive spirometer in an upright position. Breathe out normally. Place the mouthpiece in your mouth and seal your lips tightly around it. Breathe in slowly and as deeply as possible, raising the piston or the  ball toward the top of the column. Hold your breath for 3-5 seconds or for as long as possible. Allow the piston or ball to fall to the bottom of the column. Remove the mouthpiece from your mouth and breathe out normally. Rest for a few seconds and repeat Steps 1 through 7 at least 10 times every 1-2 hours when you are awake. Take your time and take a few normal breaths between deep  breaths. The spirometer may include an indicator to show your best effort. Use the indicator as a goal to work toward during each repetition. After each set of 10 deep breaths, practice coughing to be sure your lungs are clear. If you have an incision (the cut made at the time of surgery), support your incision when coughing by placing a pillow or rolled up towels firmly against it. Once you are able to get out of bed, walk around indoors and cough well. You may stop using the incentive spirometer when instructed by your caregiver.  RISKS AND COMPLICATIONS Take your time so you do not get dizzy or light-headed. If you are in pain, you may need to take or ask for pain medication before doing incentive spirometry. It is harder to take a deep breath if you are having pain. AFTER USE Rest and breathe slowly and easily. It can be helpful to keep track of a log of your progress. Your caregiver can provide you with a simple table to help with this. If you are using the spirometer at home, follow these instructions: [place] IF:  You are having difficultly using the spirometer. You have trouble using the spirometer as often as instructed. Your pain medication is not giving enough relief while using the spirometer. You develop fever of 100.5 F (38.1 C) or higher. SEEK IMMEDIATE MEDICAL CARE IF:  You cough up bloody sputum that had not been present before. You develop fever of 102 F (38.9 C) or greater. You develop worsening pain at or near the incision site. MAKE SURE YOU:  Understand these instructions. Will  watch your condition. Will get help right away if you are not doing well or get worse. Document Released: 01/23/2007 Document Revised: 12/05/2011 Document Reviewed: 03/26/2007 ExitCare Patient Information 2014 ExitCare, [place].   ________________________________________________________________________   WHAT IS A BLOOD TRANSFUSION? Blood Transfusion Information  A transfusion is the replacement of blood or some of its parts. Blood is made up of multiple cells which provide different functions. Red blood cells carry oxygen and are used for blood loss replacement. White blood cells fight against infection. Platelets control bleeding. Plasma helps clot blood. Other blood products are available for specialized needs, such as hemophilia or other clotting disorders. BEFORE THE TRANSFUSION  Who gives blood for transfusions?  Healthy volunteers who are fully evaluated to make sure their blood is safe. This is blood bank blood. Transfusion therapy is the safest it has ever been in the practice of medicine. Before blood is taken from a donor, a complete history is taken to make sure that person has no history of diseases nor engages in risky social behavior (examples are intravenous drug use or sexual activity with multiple partners). The donor's travel history is screened to minimize risk of transmitting infections, such as malaria. The donated blood is tested for signs of infectious diseases, such as HIV and hepatitis. The blood is then tested to be sure it is compatible with you in order to minimize the chance of a transfusion reaction. If you or a relative donates blood, this is often done in anticipation of surgery and is not appropriate for emergency situations. It takes many days to process the donated blood. RISKS AND COMPLICATIONS Although transfusion therapy is very safe and saves many lives, the main dangers of transfusion include:  Getting an infectious disease. Developing a transfusion  reaction. This is an allergic reaction to something in the blood you were given. Every precaution is taken to prevent  this. The decision to have a blood transfusion has been considered carefully by your caregiver before blood is given. Blood is not given unless the benefits outweigh the risks. AFTER THE TRANSFUSION Right after receiving a blood transfusion, you will usually feel much better and more energetic. This is especially true if your red blood cells have gotten low (anemic). The transfusion raises the level of the red blood cells which carry oxygen, and this usually causes an energy increase. The nurse administering the transfusion will monitor you carefully for complications. HOME CARE INSTRUCTIONS  No special instructions are needed after a transfusion. You may find your energy is better. Speak with your caregiver about any limitations on activity for underlying diseases you may have. SEEK MEDICAL CARE IF:  Your condition is not improving after your transfusion. You develop redness or irritation at the intravenous (IV) site. SEEK IMMEDIATE MEDICAL CARE IF:  Any of the following symptoms occur over the next 12 hours: Shaking chills. You have a temperature by mouth above 102 F (38.9 C), not controlled by medicine. Chest, back, or muscle pain. People around you feel you are not acting correctly or are confused. Shortness of breath or difficulty breathing. Dizziness and fainting. You get a rash or develop hives. You have a decrease in urine output. Your urine turns a dark color or changes to pink, red, or brown. Any of the following symptoms occur over the next 10 days: You have a temperature by mouth above 102 F (38.9 C), not controlled by medicine. Shortness of breath. Weakness after normal activity. The white part of the eye turns yellow (jaundice). You have a decrease in the amount of urine or are urinating less often. Your urine turns a dark color or changes to pink, red, or  brown. Document Released: 09/09/2000 Document Revised: 12/05/2011 Document Reviewed: 04/28/2008 [facility] Patient Information 2014 [place], [place].  _______________________________________________________________________

## 2021-11-22 NOTE — Progress Notes (Signed)
Anesthesia Chart Review   Case: [id number] Date/Time: 12/01/21 1113   Procedure: TOTAL KNEE ARTHROPLASTY WITH REVISION COMPONENTS (Right: Knee)   Anesthesia type: Choice   Pre-op diagnosis: FAILED RIGHT TOTAL KNEE REPLACEMENT   Location: [facility] ROOM 09 / [facility]   Surgeons: Willaim Sheng, MD       DISCUSSION:81 y.o. 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 Charlies Constable.   Pt with recent admission 10/16/21 with pancreatitis likely secondary to gallstone that passed on it's own.   New LBBB on EKG during admission.  Echo with EF 60-65% with no wall abnormalities.  Seen by cardiology during admission, no anginal sx, pt asymptomatic.   Prior to this preoperative evaluation by cardiology on 08/02/21 states, "Chart reviewed as part of pre-operative protocol coverage. Patient was contacted 08/02/2021 in reference to pre-operative risk assessment for pending surgery as outlined below.  Tina Dominguez was last seen on 06/07/2021 by Dr. Bettina Gavia.  Since that day, Tina Dominguez has done well without exertional chest pain or worsening shortness of breath.  She walks around with a walker.  She has not had any further ischemic work-up since her bypass surgery in March 2022.  Overall, she is at usual state of health.  Her RCRI risk score is class III which equates to about 6.6% risk of major cardiac event.   Therefore, based on ACC/AHA guidelines, the patient would be at acceptable risk for the planned procedure without further cardiovascular testing. "  Pt advised to hold Xarelto 3 days prior to surgery.   Anticipate pt can proceed with planned procedure barring acute status change.   VS: BP (!) 148/86    Pulse (!) 52    Temp 37.1 C    Resp 16    Ht 5\' 3"  (1.6 m)    Wt 72.6 kg    SpO2 98%    BMI 28.34 kg/m   PROVIDERS: [place], Rosalyn Charters, MD is PCP   Shirlee More, MD is  Cardiologist  LABS: Labs reviewed: Acceptable for surgery. (all labs ordered are listed, but only abnormal results are displayed)  Labs Reviewed  SURGICAL PCR SCREEN - Abnormal; Notable for the following components:      Result Value   Staphylococcus aureus POSITIVE (*)    All other components within normal limits  CBC - Abnormal; Notable for the following components:   Hemoglobin 11.3 (*)    All other components within normal limits  BASIC METABOLIC PANEL - Abnormal; Notable for the following components:   Glucose, Bld 107 (*)    Creatinine, Ser 1.22 (*)    GFR, Estimated 45 (*)    All other components within normal limits  TYPE AND SCREEN     IMAGES:   EKG: 10/15/2021 Rate 50 bpm  Sinus bradycardia Left axis deviation Left bundle branch block Abnormal ECG When compared with ECG of 14-Oct-2021 08:45, No significant change since last tracing  CV: Echo 10/15/2021  1. Left ventricular ejection fraction, by estimation, is 60 to 65%. The  left ventricle has normal function. The left ventricle has no regional  wall motion abnormalities. There is mild concentric left ventricular  hypertrophy. Left ventricular diastolic  parameters are consistent with Grade II diastolic dysfunction  (pseudonormalization).   2. Right ventricular systolic function is mildly reduced. The right  ventricular size is normal. There is mildly elevated pulmonary artery  systolic pressure.   3. Left atrial size was moderately dilated.   4. Right atrial size was moderately dilated.   5. The mitral valve is grossly normal. No evidence of mitral valve  regurgitation.   6. Tricuspid valve regurgitation is mild to moderate.   7. The aortic valve was not well visualized. Aortic valve regurgitation  is not visualized.   8. The inferior vena cava is normal in size with <50% respiratory  variability, suggesting right atrial pressure of 8 mmHg.   Myocardial Perfusion 12/04/2018 Nuclear stress EF:  58%. There was no ST segment deviation noted during stress. Defect 1: There is a medium defect of moderate severity present in the basal inferior, mid inferior and apical inferior location. Findings consistent with ischemia. This is an intermediate risk study. The left ventricular ejection fraction is normal (55-65%). Significant breast attenuation suggested on this study. This makes that study fair quality. 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   Reflux    Renal failure 12/13/2017   S/P IVC filter 12/13/2017   Schatzki's  ring 12/13/2017   Stroke ([place])    X's 4   Valvular heart disease 12/13/2017    Past Surgical History:  Procedure Laterality Date   ABDOMINAL HYSTERECTOMY  1980   TAH    APPENDECTOMY     BACK SURGERY  06/2011   BREAST SURGERY     x2   CAROTID ENDARTERECTOMY  11/11/2010   Right CEA   CORONARY ARTERY BYPASS GRAFT N/A 12/11/2018   Procedure: CORONARY ARTERY BYPASS GRAFTING (CABG) x three , using left internal mammary artery, and right leg greater saphenous vein harvested endoscopically - SVG to OM1, SVG to Distal RCA;  Surgeon: Grace Isaac, MD;  Location: [facility];  Service: Open Heart Surgery;  Laterality: N/A;   IVC FILTER INSERTION     JOINT REPLACEMENT Right 2005   Right Total Knee   JOINT REPLACEMENT Left 2005   Left Total Knee   LEFT HEART CATH AND CORONARY ANGIOGRAPHY N/A 12/10/2018   Procedure: LEFT  HEART CATH AND CORONARY ANGIOGRAPHY;  Surgeon: Jettie Booze, MD;  Location: [facility];  Service: Cardiovascular;  Laterality: N/A;   SHOULDER SURGERY     right   SPINE SURGERY  07/11/2011   Decomp. Laminectomy, fusion of L4-5,L5-S1 by Dr. Saintclair Halsted   TEE WITHOUT CARDIOVERSION N/A 12/11/2018   Procedure: TRANSESOPHAGEAL ECHOCARDIOGRAM (TEE);  Surgeon: Grace Isaac, MD;  Location: [facility];  Service: Open Heart Surgery;  Laterality: N/A;   TOTAL KNEE ARTHROPLASTY     right  and left   WISDOM TOOTH EXTRACTION      MEDICATIONS:  acetaminophen (TYLENOL) 500 MG tablet   amiodarone (PACERONE) 200 MG tablet   amLODipine (NORVASC) 5 MG tablet   atorvastatin (LIPITOR) 40 MG tablet   citalopram (CELEXA) 10 MG tablet   citalopram (CELEXA) 40 MG tablet   cloNIDine (CATAPRES) 0.1 MG tablet   famotidine (PEPCID) 20 MG tablet   furosemide (LASIX) 20 MG tablet   levothyroxine (SYNTHROID) 100 MCG tablet   linaclotide (LINZESS) 290 MCG CAPS capsule   metoprolol tartrate (LOPRESSOR) 25 MG tablet   mirtazapine (REMERON) 30 MG tablet   Multiple Vitamin (MULTIVITAMIN)  tablet   ondansetron (ZOFRAN) 8 MG tablet   Oxycodone HCl 10 MG TABS   potassium chloride SA (K-DUR) 20 MEQ tablet   XARELTO 20 MG TABS tablet   No current facility-administered medications for this encounter.    Konrad Felix Ward, PA-C [facility] Pre-Surgical Testing [phone]

## 2021-11-23 NOTE — Anesthesia Preprocedure Evaluation (Addendum)
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 on medications and Pt. on home beta blockers + CAD, + Past MI and + DVT (multiple)   Rhythm:Regular Rate:Normal     Neuro/Psych Anxiety Depression CVA    GI/Hepatic Neg liver ROS, hiatal hernia, GERD  ,  Endo/Other  Hypothyroidism   Renal/GU CRFRenal disease  negative genitourinary   Musculoskeletal  (+) Arthritis , Osteoarthritis,    Abdominal Normal abdominal exam  (+)   Peds  Hematology  (+) Blood dyscrasia, anemia , Factor V Lab Results      Component                Value               Date                      WBC                      6.7                 11/18/2021                HGB                      11.3 (L)            11/18/2021                HCT                      37.3                11/18/2021                MCV                      94.0                11/18/2021                PLT                      322                 11/18/2021           Lab Results      Component                Value               Date                      NA                       138                 11/18/2021                K  4.6                 11/18/2021                CO2                      25                  11/18/2021                GLUCOSE                  107 (H)             11/18/2021                BUN                      21                  11/18/2021                CREATININE               1.22 (H)            11/18/2021                CALCIUM                  8.9                 11/18/2021                 GFRNONAA                 45 (L)              11/18/2021             Anesthesia Other Findings   Reproductive/Obstetrics                          Anesthesia Physical Anesthesia Plan  ASA: 3  Anesthesia Plan: MAC, Regional and Spinal   Post-op Pain Management: Regional block*   Induction: Intravenous  PONV Risk Score and Plan: 2 and Ondansetron, Dexamethasone and Treatment may vary due to age or medical condition  Airway Management Planned: Simple Face Mask, Natural Airway and Nasal Cannula  Additional Equipment: None  Intra-op Plan:   Post-operative Plan:   Informed Consent: I have reviewed the patients History and Physical, chart, labs and discussed the procedure including the risks, benefits and alternatives for the proposed anesthesia with the patient or authorized representative who has indicated his/her understanding and acceptance.     Dental advisory given  Plan Discussed with: CRNA  Anesthesia Plan Comments: (DOS note: Xarelto held 72 hours  See PAT note 11/18/2021 81 y.o. 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 Charlies Constable.   Echo 10/15/2021 1. Left ventricular ejection fraction, by estimation, is 60 to 65%. The  left ventricle has normal function. The left ventricle has no regional  wall motion abnormalities. There is mild concentric left ventricular  hypertrophy. Left ventricular diastolic  parameters are consistent with Grade II diastolic dysfunction  (pseudonormalization).  2. Right ventricular systolic function is mildly reduced. The right  ventricular  size is normal. There is mildly elevated pulmonary artery  systolic pressure.  3. Left atrial size was moderately dilated.  4. Right atrial size was moderately dilated.  5. The mitral valve is grossly normal. No evidence of mitral valve   regurgitation.  6. Tricuspid valve regurgitation is mild to moderate.  7. The aortic valve was not well visualized. Aortic valve regurgitation  is not visualized.  8. The inferior vena cava is normal in size with <50% respiratory  variability, suggesting right atrial pressure of 8 mmHg. )      Anesthesia Quick Evaluation

## 2021-11-28 NOTE — Progress Notes (Signed)
Cardiology Office Note:    Date:  11/30/2021   ID:  Tina Dominguez, DOB [birth year], MRN [id number]  PCP:  Bonnita Nasuti, MD  Cardiologist:  Shirlee More, MD    Referring MD: Bonnita Nasuti, MD    ASSESSMENT:    1. PAF (paroxysmal atrial fibrillation) ([place])   2. On amiodarone therapy   3. Chronic anticoagulation   4. Coronary artery disease of native artery of native heart with stable angina pectoris ([place])   5. S/P CABG x 3   6. Hypertensive heart disease with heart failure ([place])   7. Acquired hypercoagulable state ([place])    PLAN:    In order of problems listed above:  From a cardiology perspective she is doing well maintaining sinus rhythm on low-dose amiodarone with a minimum dose of beta-blocker on anticoagulant and recent echocardiogram shows normal LV systolic function and stable pattern left bundle branch block.  She is optimized for planned orthopedic surgery Stable CAD no anginal discomfort following CABG Stable BP at target no evidence of fluid overload continue her loop diuretic antihypertensives including amlodipine minimum beta-blocker centrally active clonidine She is on long-term anticoagulation  Next appointment: 6 months   Medication Adjustments/Labs and Tests Ordered: Current medicines are reviewed at length with the patient today.  Concerns regarding medicines are outlined above.  No orders of the defined types were placed in this encounter.  No orders of the defined types were placed in this encounter.   No chief complaint on file.   History of Present Illness:    Tina Dominguez is a 81 y.o. female with a hx of paroxysmal atrial fibrillation maintaining sinus rhythm on low-dose amiodarone with chronic anticoagulation, CAD with CABG in March 2020 hypertensive heart disease with heart failure hyperlipidemia RCEA and hypercoagulable state with factor V Leiden mutation last seen 06/07/2021.  She was seen by cardiology Dr Harrington Challenger as an inpatient 10/15/2021  for sinus bradycardia and left bundle branch block there is no alteration in medications.  Compliance with diet, lifestyle and medications: Yes, she is having redo right total knee arthroplasty tomorrow she will be off her anticoagulant 3 days before surgery usually resume 2 days after She is recovered from pancreatitis No complaints of bradycardia palpitation lightheadedness syncope edema or shortness of breath or chest pain.  She had an echocardiogram performed 10/15/2021 as an inpatient [facility] EF 60 to 65% right ventricle mild right ventricular dysfunction normal size pulmonary pressure mildly elevated both atria are moderately enlarged and there is mild to moderate tricuspid regurgitation.  1. Left ventricular ejection fraction, by estimation, is 60 to 65%. The  left ventricle has normal function. The left ventricle has no regional  wall motion abnormalities. There is mild concentric left ventricular  hypertrophy. Left ventricular diastolic  parameters are consistent with Grade II diastolic dysfunction  (pseudonormalization).   2. Right ventricular systolic function is mildly reduced. The right  ventricular size is normal. There is mildly elevated pulmonary artery  systolic pressure.   3. Left atrial size was moderately dilated.   4. Right atrial size was moderately dilated.   5. The mitral valve is grossly normal. No evidence of mitral valve  regurgitation.   6. Tricuspid valve regurgitation is mild to moderate.   7. The aortic valve was not well visualized. Aortic valve regurgitation  is not visualized.   8. The inferior vena cava is normal in size with <50% respiratory  variability, suggesting right atrial pressure of 8 mmHg.  Past Medical History:  Diagnosis Date   Aftercare following surgery of the circulatory system, NEC 10/14/2013   Anemia 12/13/2017   Anxiety 12/13/2017   Arthritis    CAD (coronary artery disease) 12/10/2018   Calcification of abdominal aorta  ([place]) 12/18/2017   Carotid artery occlusion    Carotid stenosis, bilateral 05/26/2011   CKD (chronic kidney disease) stage 3, GFR 30-59 ml/min (HCC) 12/13/2017   Clotting disorder ([place])    Coronary artery disease    Depression    Dysphagia 04/17/2017   Factor V Leiden mutation ([place]) 12/18/2017   GERD (gastroesophageal reflux disease) 05/26/2011   GI bleeding 10/04/2017   Gout 12/13/2017   Hiatal hernia 12/13/2017   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    History of kidney stones    History of recurrent TIAs 12/13/2017   History of right-sided carotid endarterectomy 10/10/2011   Hyperlipidemia    Hypertension    Hypertensive heart disease with heart failure ([place]) 01/29/2019   Hypothyroidism    Leg pain    with walking   Myocardial infarction [facility])    Occlusion and stenosis of carotid artery without mention of cerebral infarction 10/08/2012   Osteoarthritis 05/26/2011   Osteoporosis    Pancreatitis    PAT (paroxysmal atrial tachycardia) ([place]) 12/05/2018   PE (pulmonary embolism) 05/26/2011   Pneumonia    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[place]) 04/17/2017   Reflux    Renal failure 12/13/2017   S/P IVC filter 12/13/2017   Schatzki's ring 12/13/2017   Stroke ([place])    X's 4   Valvular heart disease 12/13/2017    Past Surgical History:  Procedure Laterality Date   ABDOMINAL HYSTERECTOMY  1980   TAH    APPENDECTOMY     BACK SURGERY  06/2011   BREAST SURGERY     x2   CAROTID ENDARTERECTOMY  11/11/2010   Right CEA   CORONARY ARTERY BYPASS GRAFT N/A 12/11/2018   Procedure: CORONARY ARTERY BYPASS GRAFTING (CABG) x three , using left internal mammary artery, and right leg greater saphenous vein harvested endoscopically - SVG to OM1, SVG to Distal RCA;  Surgeon: Grace Isaac, MD;  Location: [place];  Service: Open Heart Surgery;  Laterality: N/A;   IVC FILTER INSERTION     JOINT  REPLACEMENT Right 2005   Right Total Knee   JOINT REPLACEMENT Left 2005   Left Total Knee   LEFT HEART CATH AND CORONARY ANGIOGRAPHY N/A 12/10/2018   Procedure: LEFT HEART CATH AND CORONARY ANGIOGRAPHY;  Surgeon: Jettie Booze, MD;  Location: [facility];  Service: Cardiovascular;  Laterality: N/A;   SHOULDER SURGERY     right   SPINE SURGERY  07/11/2011   Decomp. Laminectomy, fusion of L4-5,L5-S1 by Dr. Saintclair Halsted   TEE WITHOUT CARDIOVERSION N/A 12/11/2018   Procedure: TRANSESOPHAGEAL ECHOCARDIOGRAM (TEE);  Surgeon: Grace Isaac, MD;  Location: [place];  Service: Open Heart Surgery;  Laterality: N/A;   TOTAL KNEE ARTHROPLASTY     right  and left   WISDOM TOOTH EXTRACTION      Current Medications: Current Meds  Medication Sig   acetaminophen (TYLENOL) 500 MG tablet Take 500-1,000 mg by mouth every 6 (six) hours as needed for moderate pain.    amiodarone (PACERONE) 200 MG tablet TAKE 1 TABLET(200 MG) BY MOUTH DAILY   amLODipine (NORVASC) 5 MG tablet Take 1 tablet (5  mg total) by mouth daily.   atorvastatin (LIPITOR) 40 MG tablet TAKE 1 TABLET BY MOUTH EVERY DAY AT 6 PM   citalopram (CELEXA) 10 MG tablet Take 10 mg by mouth at bedtime. Take with 40 mg to equal 50 mg at bedtime   citalopram (CELEXA) 40 MG tablet Take 40 mg by mouth daily. Take with 10 mg to equal 50 mg at bedtime   cloNIDine (CATAPRES) 0.1 MG tablet Take 1 tablet (0.1 mg total) by mouth at bedtime. Take 1 extra tablet daily if your systolic BP (top number) is greater than 180 and if your heart rate is greater than 100.   famotidine (PEPCID) 20 MG tablet Take 20 mg by mouth daily as needed for heartburn or indigestion.   furosemide (LASIX) 20 MG tablet Take 20 mg by mouth daily.   levothyroxine (SYNTHROID) 100 MCG tablet Take 100 mcg by mouth daily.   linaclotide (LINZESS) 290 MCG CAPS capsule Take 290 mcg by mouth every other day.   metoprolol tartrate (LOPRESSOR) 25 MG tablet Metoprolol tartrate 12.5 mg (half  tablet) in the morning daily. Hold for heart rate <100.   mirtazapine (REMERON) 30 MG tablet Take 30 mg by mouth at bedtime.    Multiple Vitamin (MULTIVITAMIN) tablet Take 1 tablet by mouth daily.   Oxycodone HCl 10 MG TABS Take 10 mg by mouth every 6 (six) hours as needed (pain).    potassium chloride SA (K-DUR) 20 MEQ tablet Take 1 Tab (41mq) every other day with lasix (Patient taking differently: Take 20 mEq by mouth 2 (two) times daily.)   XARELTO 20 MG TABS tablet Take 20 mg by mouth daily.   [DISCONTINUED] ondansetron (ZOFRAN) 8 MG tablet Take 8 mg by mouth every 8 (eight) hours as needed for nausea/vomiting.     Allergies:   Patient has no known allergies.   Social History   Socioeconomic History   Marital status: Widowed    Spouse name: Not on file   Number of children: Not on file   Years of education: Not on file   Highest education level: Not on file  Occupational History   Not on file  Tobacco Use   Smoking status: Never   Smokeless tobacco: Never  Vaping Use   Vaping Use: Never used  Substance and Sexual Activity   Alcohol use: No   Drug use: No   Sexual activity: Not on file  Other Topics Concern   Not on file  Social History Narrative   Not on file   Social Determinants of Health   Financial Resource Strain: Not on file  Food Insecurity: Not on file  Transportation Needs: Not on file  Physical Activity: Not on file  Stress: Not on file  Social Connections: Not on file     Family History: The patient's family history includes Clotting disorder in her mother; Deep vein thrombosis in her mother and sister; Diabetes in her mother and sister; Heart disease in her brother and mother; Heart disease (age of onset: 658 in her father; Hyperlipidemia in her mother; Hypertension in her mother; Stroke in her maternal grandmother; Varicose Veins in her father. ROS:   Please see the history of present illness.    All other systems reviewed and are  negative.  EKGs/Labs/Other Studies Reviewed:    The following studies were reviewed today: Echo: 10/15/2021   IMPRESSIONS   1. Left ventricular ejection fraction, by estimation, is 60 to 65%. The  left ventricle has normal function. The  left ventricle has no regional  wall motion abnormalities. There is mild concentric left ventricular  hypertrophy. Left ventricular diastolic  parameters are consistent with Grade II diastolic dysfunction  (pseudonormalization).   2. Right ventricular systolic function is mildly reduced. The right  ventricular size is normal. There is mildly elevated pulmonary artery  systolic pressure.   3. Left atrial size was moderately dilated.   4. Right atrial size was moderately dilated.   5. The mitral valve is grossly normal. No evidence of mitral valve  regurgitation.   6. Tricuspid valve regurgitation is mild to moderate.   7. The aortic valve was not well visualized. Aortic valve regurgitation  is not visualized.   8. The inferior vena cava is normal in size with <50% respiratory  variability, suggesting right atrial pressure  EKG:  EKG ordered today and personally reviewed.  The ekg ordered today demonstrates sinus bradycardia 54 bpm first-degree AV block left bundle branch block.  Recent Labs: 10/15/2021: ALT 10 10/16/2021: Magnesium 2.1 11/18/2021: BUN 21; Creatinine, Ser 1.22; Hemoglobin 11.3; Platelets 322; Potassium 4.6; Sodium 138  Recent Lipid Panel    Component Value Date/Time   CHOL 118 10/14/2021 1131   CHOL 159 12/13/2019 1346   TRIG 96 10/14/2021 1131   HDL 58 10/14/2021 1131   HDL 69 12/13/2019 1346   CHOLHDL 2.0 10/14/2021 1131   VLDL 19 10/14/2021 1131   LDLCALC 41 10/14/2021 1131   LDLCALC 62 12/13/2019 1346    Physical Exam:    VS:  BP (!) 146/58    Pulse (!) 54    Ht '5\' 3"'$  (1.6 m)    Wt 158 lb (71.7 kg)    SpO2 96%    BMI 27.99 kg/m     Wt Readings from Last 3 Encounters:  11/30/21 158 lb (71.7 kg)  11/18/21 160 lb (72.6  kg)  10/22/21 152 lb 9.6 oz (69.2 kg)     GEN:  Well nourished, well developed in no acute distress HEENT: Normal NECK: No JVD; No carotid bruits LYMPHATICS: No lymphadenopathy CARDIAC: Paradoxical second heart sound RRR, no murmurs, rubs, gallops RESPIRATORY:  Clear to auscultation without rales, wheezing or rhonchi  ABDOMEN: Soft, non-tender, non-distended MUSCULOSKELETAL:  No edema; No deformity  SKIN: Warm and dry NEUROLOGIC:  Alert and oriented x 3 PSYCHIATRIC:  Normal affect    Signed, Shirlee More, MD  11/30/2021 9:36 AM    [place]

## 2021-11-29 ENCOUNTER — Encounter (HOSPITAL_COMMUNITY)
Admission: RE | Admit: 2021-11-29 | Discharge: 2021-11-29 | Disposition: A | Discharge status: Home / Self Care | Payer: PPO | Source: Ambulatory Visit | Attending: Orthopedic Surgery | Admitting: Orthopedic Surgery

## 2021-11-29 ENCOUNTER — Other Ambulatory Visit: Payer: Self-pay

## 2021-11-29 DIAGNOSIS — Z20822 Contact with and (suspected) exposure to covid-19: Secondary | ICD-10-CM | POA: Insufficient documentation

## 2021-11-29 DIAGNOSIS — Z01812 Encounter for preprocedural laboratory examination: Secondary | ICD-10-CM | POA: Insufficient documentation

## 2021-11-29 DIAGNOSIS — I219 Acute myocardial infarction, unspecified: Secondary | ICD-10-CM | POA: Insufficient documentation

## 2021-11-29 DIAGNOSIS — E039 Hypothyroidism, unspecified: Secondary | ICD-10-CM | POA: Insufficient documentation

## 2021-11-29 DIAGNOSIS — J189 Pneumonia, unspecified organism: Secondary | ICD-10-CM | POA: Insufficient documentation

## 2021-11-29 DIAGNOSIS — Z87442 Personal history of urinary calculi: Secondary | ICD-10-CM | POA: Insufficient documentation

## 2021-11-29 LAB — SARS CORONAVIRUS 2 (TAT 6-24 HRS): SARS Coronavirus 2: NEGATIVE

## 2021-11-30 ENCOUNTER — Encounter: Payer: Self-pay | Admitting: Cardiology

## 2021-11-30 ENCOUNTER — Ambulatory Visit: Payer: PPO | Admitting: Cardiology

## 2021-11-30 VITALS — BP 146/58 | HR 54 | Ht 63.0 in | Wt 158.0 lb

## 2021-11-30 DIAGNOSIS — I25118 Atherosclerotic heart disease of native coronary artery with other forms of angina pectoris: Secondary | ICD-10-CM | POA: Diagnosis not present

## 2021-11-30 DIAGNOSIS — I480 Paroxysmal atrial fibrillation: Secondary | ICD-10-CM

## 2021-11-30 DIAGNOSIS — Z7901 Long term (current) use of anticoagulants: Secondary | ICD-10-CM | POA: Diagnosis not present

## 2021-11-30 DIAGNOSIS — D6869 Other thrombophilia: Secondary | ICD-10-CM

## 2021-11-30 DIAGNOSIS — I110 Hypertensive heart disease with heart failure: Secondary | ICD-10-CM

## 2021-11-30 DIAGNOSIS — Z79899 Other long term (current) drug therapy: Secondary | ICD-10-CM

## 2021-11-30 DIAGNOSIS — Z951 Presence of aortocoronary bypass graft: Secondary | ICD-10-CM

## 2021-11-30 NOTE — Patient Instructions (Signed)
Medication Instructions:  ?Your physician recommends that you continue on your current medications as directed. Please refer to the Current Medication list given to you today. ? ?*If you need a refill on your cardiac medications before your next appointment, please call your pharmacy* ? ? ?Lab Work: ?NONE ?If you have labs (blood work) drawn today and your tests are completely normal, you will receive your results only by: ?MyChart Message (if you have MyChart) OR ?A paper copy in the mail ?If you have any lab test that is abnormal or we need to change your treatment, we will call you to review the results. ? ? ?Testing/Procedures: ?NONE ? ? ?Follow-Up: ?At [facility], you and your health needs are our priority.  As part of our continuing mission to provide you with exceptional heart care, we have created designated Provider Care Teams.  These Care Teams include your primary Cardiologist (physician) and Advanced Practice Providers (APPs -  Physician Assistants and Nurse Practitioners) who all work together to provide you with the care you need, when you need it. ? ?We recommend signing up for the patient portal called "MyChart".  Sign up information is provided on this After Visit Summary.  MyChart is used to connect with patients for Virtual Visits (Telemedicine).  Patients are able to view lab/test results, encounter notes, upcoming appointments, etc.  Non-urgent messages can be sent to your provider as well.   ?To learn more about what you can do with MyChart, go to [email or web address].   ? ?Your next appointment:   ?6 month(s) ? ?The format for your next appointment:   ?In Person ? ?Provider:   ?Brian Munley, MD  ? ? ?Other Instructions ?  ?

## 2021-12-01 ENCOUNTER — Inpatient Hospital Stay (HOSPITAL_COMMUNITY): Payer: PPO | Admitting: Anesthesiology

## 2021-12-01 ENCOUNTER — Other Ambulatory Visit: Payer: Self-pay

## 2021-12-01 ENCOUNTER — Encounter (HOSPITAL_COMMUNITY)
Admission: RE | Disposition: A | Discharge status: Home / Self Care | Payer: Self-pay | Source: Home / Self Care | Attending: Orthopedic Surgery

## 2021-12-01 ENCOUNTER — Inpatient Hospital Stay (HOSPITAL_COMMUNITY)
Admission: RE | Admit: 2021-12-01 | Discharge: 2021-12-03 | DRG: 467 | Disposition: A | Discharge status: Home Health | Payer: PPO | Attending: Orthopedic Surgery | Admitting: Orthopedic Surgery

## 2021-12-01 ENCOUNTER — Encounter (HOSPITAL_COMMUNITY): Payer: Self-pay | Admitting: Orthopedic Surgery

## 2021-12-01 ENCOUNTER — Inpatient Hospital Stay (HOSPITAL_COMMUNITY): Payer: PPO

## 2021-12-01 ENCOUNTER — Inpatient Hospital Stay (HOSPITAL_COMMUNITY): Payer: PPO | Admitting: Physician Assistant

## 2021-12-01 DIAGNOSIS — N189 Chronic kidney disease, unspecified: Secondary | ICD-10-CM

## 2021-12-01 DIAGNOSIS — K219 Gastro-esophageal reflux disease without esophagitis: Secondary | ICD-10-CM | POA: Diagnosis present

## 2021-12-01 DIAGNOSIS — F32A Depression, unspecified: Secondary | ICD-10-CM | POA: Diagnosis present

## 2021-12-01 DIAGNOSIS — E039 Hypothyroidism, unspecified: Secondary | ICD-10-CM | POA: Diagnosis present

## 2021-12-01 DIAGNOSIS — Z981 Arthrodesis status: Secondary | ICD-10-CM | POA: Diagnosis not present

## 2021-12-01 DIAGNOSIS — N183 Chronic kidney disease, stage 3 unspecified: Secondary | ICD-10-CM | POA: Diagnosis present

## 2021-12-01 DIAGNOSIS — F419 Anxiety disorder, unspecified: Secondary | ICD-10-CM | POA: Diagnosis present

## 2021-12-01 DIAGNOSIS — Z86718 Personal history of other venous thrombosis and embolism: Secondary | ICD-10-CM | POA: Diagnosis not present

## 2021-12-01 DIAGNOSIS — Z832 Family history of diseases of the blood and blood-forming organs and certain disorders involving the immune mechanism: Secondary | ICD-10-CM | POA: Diagnosis not present

## 2021-12-01 DIAGNOSIS — I700 Atherosclerosis of aorta: Secondary | ICD-10-CM | POA: Diagnosis present

## 2021-12-01 DIAGNOSIS — Z833 Family history of diabetes mellitus: Secondary | ICD-10-CM | POA: Diagnosis not present

## 2021-12-01 DIAGNOSIS — T84018A Broken internal joint prosthesis, other site, initial encounter: Secondary | ICD-10-CM

## 2021-12-01 DIAGNOSIS — I2510 Atherosclerotic heart disease of native coronary artery without angina pectoris: Secondary | ICD-10-CM | POA: Diagnosis present

## 2021-12-01 DIAGNOSIS — I447 Left bundle-branch block, unspecified: Secondary | ICD-10-CM | POA: Diagnosis present

## 2021-12-01 DIAGNOSIS — M810 Age-related osteoporosis without current pathological fracture: Secondary | ICD-10-CM | POA: Diagnosis present

## 2021-12-01 DIAGNOSIS — I480 Paroxysmal atrial fibrillation: Secondary | ICD-10-CM | POA: Diagnosis present

## 2021-12-01 DIAGNOSIS — I129 Hypertensive chronic kidney disease with stage 1 through stage 4 chronic kidney disease, or unspecified chronic kidney disease: Secondary | ICD-10-CM | POA: Diagnosis present

## 2021-12-01 DIAGNOSIS — Z96652 Presence of left artificial knee joint: Secondary | ICD-10-CM | POA: Diagnosis present

## 2021-12-01 DIAGNOSIS — Z86711 Personal history of pulmonary embolism: Secondary | ICD-10-CM | POA: Diagnosis not present

## 2021-12-01 DIAGNOSIS — Z01812 Encounter for preprocedural laboratory examination: Secondary | ICD-10-CM

## 2021-12-01 DIAGNOSIS — Z951 Presence of aortocoronary bypass graft: Secondary | ICD-10-CM

## 2021-12-01 DIAGNOSIS — Z95828 Presence of other vascular implants and grafts: Secondary | ICD-10-CM

## 2021-12-01 DIAGNOSIS — D6851 Activated protein C resistance: Secondary | ICD-10-CM | POA: Diagnosis present

## 2021-12-01 DIAGNOSIS — Z8673 Personal history of transient ischemic attack (TIA), and cerebral infarction without residual deficits: Secondary | ICD-10-CM

## 2021-12-01 DIAGNOSIS — Z9889 Other specified postprocedural states: Secondary | ICD-10-CM

## 2021-12-01 DIAGNOSIS — Z90710 Acquired absence of both cervix and uterus: Secondary | ICD-10-CM | POA: Diagnosis not present

## 2021-12-01 DIAGNOSIS — Y792 Prosthetic and other implants, materials and accessory orthopedic devices associated with adverse incidents: Secondary | ICD-10-CM | POA: Diagnosis present

## 2021-12-01 DIAGNOSIS — Z87442 Personal history of urinary calculi: Secondary | ICD-10-CM | POA: Diagnosis not present

## 2021-12-01 DIAGNOSIS — Z01818 Encounter for other preprocedural examination: Secondary | ICD-10-CM

## 2021-12-01 DIAGNOSIS — T84032A Mechanical loosening of internal right knee prosthetic joint, initial encounter: Secondary | ICD-10-CM | POA: Diagnosis present

## 2021-12-01 DIAGNOSIS — Z83438 Family history of other disorder of lipoprotein metabolism and other lipidemia: Secondary | ICD-10-CM

## 2021-12-01 DIAGNOSIS — Z823 Family history of stroke: Secondary | ICD-10-CM

## 2021-12-01 DIAGNOSIS — Z8249 Family history of ischemic heart disease and other diseases of the circulatory system: Secondary | ICD-10-CM

## 2021-12-01 DIAGNOSIS — Z20822 Contact with and (suspected) exposure to covid-19: Secondary | ICD-10-CM | POA: Diagnosis present

## 2021-12-01 DIAGNOSIS — I252 Old myocardial infarction: Secondary | ICD-10-CM | POA: Diagnosis not present

## 2021-12-01 DIAGNOSIS — Z96659 Presence of unspecified artificial knee joint: Secondary | ICD-10-CM

## 2021-12-01 DIAGNOSIS — T84062A Wear of articular bearing surface of internal prosthetic right knee joint, initial encounter: Secondary | ICD-10-CM | POA: Diagnosis present

## 2021-12-01 DIAGNOSIS — T84092A Other mechanical complication of internal right knee prosthesis, initial encounter: Secondary | ICD-10-CM | POA: Diagnosis present

## 2021-12-01 DIAGNOSIS — M659 Synovitis and tenosynovitis, unspecified: Secondary | ICD-10-CM | POA: Diagnosis present

## 2021-12-01 DIAGNOSIS — E785 Hyperlipidemia, unspecified: Secondary | ICD-10-CM | POA: Diagnosis present

## 2021-12-01 HISTORY — PX: TOTAL KNEE ARTHROPLASTY WITH REVISION COMPONENTS: SHX6198

## 2021-12-01 LAB — BPAM RBC
Blood Product Expiration Date: 202304012359
Blood Product Expiration Date: 202304022359
Unit Type and Rh: 5100
Unit Type and Rh: 5100

## 2021-12-01 LAB — TYPE AND SCREEN
ABO/RH(D): O POS
Antibody Screen: NEGATIVE
Donor AG Type: NEGATIVE
Donor AG Type: NEGATIVE
Unit division: 0
Unit division: 0

## 2021-12-01 SURGERY — TOTAL KNEE ARTHROPLASTY WITH REVISION COMPONENTS
Anesthesia: Monitor Anesthesia Care | Site: Knee | Laterality: Right

## 2021-12-01 MED ORDER — SODIUM CHLORIDE 0.9 % IR SOLN
Status: DC | PRN
  Administered 2021-12-01: 3000 mL

## 2021-12-01 MED ORDER — CEFAZOLIN SODIUM-DEXTROSE 2-4 GM/100ML-% IV SOLN
INTRAVENOUS | Status: AC
  Filled 2021-12-01: qty 100

## 2021-12-01 MED ORDER — AMIODARONE HCL 200 MG PO TABS
200.0000 mg | ORAL_TABLET | Freq: Every day | ORAL | Status: DC
  Administered 2021-12-02 – 2021-12-03 (×2): 200 mg via ORAL
  Filled 2021-12-01 (×2): qty 1

## 2021-12-01 MED ORDER — OXYCODONE HCL 5 MG PO TABS
10.0000 mg | ORAL_TABLET | ORAL | Status: DC | PRN
  Administered 2021-12-01: 10 mg via ORAL
  Administered 2021-12-02 – 2021-12-03 (×4): 15 mg via ORAL
  Administered 2021-12-03: 10 mg via ORAL
  Filled 2021-12-01: qty 2
  Filled 2021-12-01 (×3): qty 3
  Filled 2021-12-01: qty 2
  Filled 2021-12-01: qty 3

## 2021-12-01 MED ORDER — ACETAMINOPHEN 500 MG PO TABS
1000.0000 mg | ORAL_TABLET | Freq: Four times a day (QID) | ORAL | Status: AC
  Administered 2021-12-01 – 2021-12-02 (×4): 1000 mg via ORAL
  Filled 2021-12-01 (×4): qty 2

## 2021-12-01 MED ORDER — BUPIVACAINE LIPOSOME 1.3 % IJ SUSP: 20.0000 mL | Freq: Once | INTRAMUSCULAR | Status: AC

## 2021-12-01 MED ORDER — FENTANYL CITRATE PF 50 MCG/ML IJ SOSY
PREFILLED_SYRINGE | INTRAMUSCULAR | Status: AC
  Filled 2021-12-01: qty 3

## 2021-12-01 MED ORDER — RIVAROXABAN 10 MG PO TABS
10.0000 mg | ORAL_TABLET | Freq: Every day | ORAL | Status: DC
  Administered 2021-12-02: 08:00:00 10 mg via ORAL
  Filled 2021-12-01: qty 1

## 2021-12-01 MED ORDER — HYDROMORPHONE HCL 1 MG/ML IJ SOLN
0.2500 mg | Freq: Once | INTRAMUSCULAR | Status: AC
  Administered 2021-12-01: 0.25 mg via INTRAVENOUS

## 2021-12-01 MED ORDER — LEVOTHYROXINE SODIUM 100 MCG PO TABS
100.0000 ug | ORAL_TABLET | Freq: Every day | ORAL | Status: DC
  Administered 2021-12-02 – 2021-12-03 (×2): 100 ug via ORAL
  Filled 2021-12-01 (×2): qty 1

## 2021-12-01 MED ORDER — POVIDONE-IODINE 10 % EX SWAB
2.0000 | Freq: Once | CUTANEOUS | Status: AC
Start: 2021-12-01 — End: 2021-12-01
  Administered 2021-12-01: 2 via TOPICAL

## 2021-12-01 MED ORDER — LACTATED RINGERS IV SOLN: INTRAVENOUS | Status: DC

## 2021-12-01 MED ORDER — HYDROMORPHONE HCL 1 MG/ML IJ SOLN
INTRAMUSCULAR | Status: AC
  Filled 2021-12-01: qty 1

## 2021-12-01 MED ORDER — POTASSIUM CHLORIDE CRYS ER 20 MEQ PO TBCR
20.0000 meq | EXTENDED_RELEASE_TABLET | Freq: Two times a day (BID) | ORAL | Status: DC
  Administered 2021-12-01 – 2021-12-03 (×4): 20 meq via ORAL
  Filled 2021-12-01 (×4): qty 1

## 2021-12-01 MED ORDER — TRANEXAMIC ACID-NACL 1000-0.7 MG/100ML-% IV SOLN
1000.0000 mg | INTRAVENOUS | Status: AC
  Administered 2021-12-01: 1000 mg via INTRAVENOUS

## 2021-12-01 MED ORDER — ONDANSETRON HCL 4 MG/2ML IJ SOLN
INTRAMUSCULAR | Status: AC
  Filled 2021-12-01: qty 2

## 2021-12-01 MED ORDER — CHLORHEXIDINE GLUCONATE 0.12 % MT SOLN
15.0000 mL | Freq: Once | OROMUCOSAL | Status: AC
Start: 2021-12-01 — End: 2021-12-01
  Administered 2021-12-01: 15 mL via OROMUCOSAL

## 2021-12-01 MED ORDER — MENTHOL 3 MG MT LOZG: 1.0000 | LOZENGE | OROMUCOSAL | Status: DC | PRN

## 2021-12-01 MED ORDER — CLONIDINE HCL (ANALGESIA) 100 MCG/ML EP SOLN
EPIDURAL | Status: DC | PRN
  Administered 2021-12-01: 100 ug

## 2021-12-01 MED ORDER — FUROSEMIDE 20 MG PO TABS
20.0000 mg | ORAL_TABLET | Freq: Every day | ORAL | Status: DC
  Administered 2021-12-02: 20:00:00 20 mg via ORAL
  Filled 2021-12-01 (×3): qty 1

## 2021-12-01 MED ORDER — ONDANSETRON HCL 4 MG/2ML IJ SOLN
INTRAMUSCULAR | Status: DC | PRN
  Administered 2021-12-01: 4 mg via INTRAVENOUS

## 2021-12-01 MED ORDER — EPHEDRINE SULFATE-NACL 50-0.9 MG/10ML-% IV SOSY
PREFILLED_SYRINGE | INTRAVENOUS | Status: DC | PRN
  Administered 2021-12-01: 10 mg via INTRAVENOUS

## 2021-12-01 MED ORDER — BUPIVACAINE LIPOSOME 1.3 % IJ SUSP
INTRAMUSCULAR | Status: AC
  Filled 2021-12-01: qty 20

## 2021-12-01 MED ORDER — MIDAZOLAM HCL 2 MG/2ML IJ SOLN
INTRAMUSCULAR | Status: AC
  Filled 2021-12-01: qty 2

## 2021-12-01 MED ORDER — TRANEXAMIC ACID-NACL 1000-0.7 MG/100ML-% IV SOLN
INTRAVENOUS | Status: AC
  Filled 2021-12-01: qty 100

## 2021-12-01 MED ORDER — ACETAMINOPHEN 10 MG/ML IV SOLN
1000.0000 mg | Freq: Once | INTRAVENOUS | Status: DC | PRN
  Administered 2021-12-01: 1000 mg via INTRAVENOUS

## 2021-12-01 MED ORDER — DEXAMETHASONE SODIUM PHOSPHATE 10 MG/ML IJ SOLN
8.0000 mg | Freq: Once | INTRAMUSCULAR | Status: AC
  Administered 2021-12-01: 8 mg via INTRAVENOUS

## 2021-12-01 MED ORDER — BUPIVACAINE HCL (PF) 0.5 % IJ SOLN
INTRAMUSCULAR | Status: AC
  Filled 2021-12-01: qty 30

## 2021-12-01 MED ORDER — ORAL CARE MOUTH RINSE: 15.0000 mL | Freq: Once | OROMUCOSAL | Status: AC

## 2021-12-01 MED ORDER — ACETAMINOPHEN 325 MG PO TABS: 325.0000 mg | ORAL_TABLET | Freq: Four times a day (QID) | ORAL | Status: DC | PRN

## 2021-12-01 MED ORDER — OXYCODONE HCL 5 MG PO TABS
ORAL_TABLET | ORAL | Status: AC
  Filled 2021-12-01: qty 2

## 2021-12-01 MED ORDER — ONDANSETRON HCL 4 MG PO TABS: 4.0000 mg | ORAL_TABLET | Freq: Four times a day (QID) | ORAL | Status: DC | PRN

## 2021-12-01 MED ORDER — ADULT MULTIVITAMIN W/MINERALS CH
1.0000 | ORAL_TABLET | Freq: Every day | ORAL | Status: DC
  Administered 2021-12-02 – 2021-12-03 (×2): 1 via ORAL
  Filled 2021-12-01 (×2): qty 1

## 2021-12-01 MED ORDER — CITALOPRAM HYDROBROMIDE 20 MG PO TABS
50.0000 mg | ORAL_TABLET | Freq: Every day | ORAL | Status: DC
Start: 2021-12-01 — End: 2021-12-03
  Administered 2021-12-01 – 2021-12-02 (×2): 50 mg via ORAL
  Filled 2021-12-01 (×2): qty 3

## 2021-12-01 MED ORDER — ACETAMINOPHEN 500 MG PO TABS: 1000.0000 mg | ORAL_TABLET | Freq: Once | ORAL | Status: DC

## 2021-12-01 MED ORDER — CEFAZOLIN SODIUM-DEXTROSE 2-4 GM/100ML-% IV SOLN
2.0000 g | INTRAVENOUS | Status: AC
  Administered 2021-12-01: 2 g via INTRAVENOUS

## 2021-12-01 MED ORDER — PANTOPRAZOLE SODIUM 40 MG PO TBEC
40.0000 mg | DELAYED_RELEASE_TABLET | Freq: Every day | ORAL | Status: DC
  Administered 2021-12-03: 40 mg via ORAL
  Filled 2021-12-01 (×2): qty 1

## 2021-12-01 MED ORDER — PHENYLEPHRINE HCL-NACL 20-0.9 MG/250ML-% IV SOLN
INTRAVENOUS | Status: AC
  Filled 2021-12-01: qty 250

## 2021-12-01 MED ORDER — CEFAZOLIN SODIUM-DEXTROSE 2-4 GM/100ML-% IV SOLN
2.0000 g | Freq: Three times a day (TID) | INTRAVENOUS | Status: DC
  Administered 2021-12-01 – 2021-12-03 (×6): 2 g via INTRAVENOUS
  Filled 2021-12-01 (×6): qty 100

## 2021-12-01 MED ORDER — PROPOFOL 10 MG/ML IV BOLUS
INTRAVENOUS | Status: AC
  Filled 2021-12-01: qty 20

## 2021-12-01 MED ORDER — DEXAMETHASONE SODIUM PHOSPHATE 10 MG/ML IJ SOLN
INTRAMUSCULAR | Status: AC
  Filled 2021-12-01: qty 1

## 2021-12-01 MED ORDER — ACETAMINOPHEN 500 MG PO TABS
ORAL_TABLET | ORAL | Status: AC
  Filled 2021-12-01: qty 2

## 2021-12-01 MED ORDER — ZOLPIDEM TARTRATE 5 MG PO TABS: 5.0000 mg | ORAL_TABLET | Freq: Every evening | ORAL | Status: DC | PRN

## 2021-12-01 MED ORDER — PROPOFOL 1000 MG/100ML IV EMUL
INTRAVENOUS | Status: AC
  Filled 2021-12-01: qty 100

## 2021-12-01 MED ORDER — MIDAZOLAM HCL 2 MG/2ML IJ SOLN
0.5000 mg | Freq: Once | INTRAMUSCULAR | Status: AC
  Administered 2021-12-01: 0.5 mg via INTRAVENOUS

## 2021-12-01 MED ORDER — FENTANYL CITRATE PF 50 MCG/ML IJ SOSY
50.0000 ug | PREFILLED_SYRINGE | INTRAMUSCULAR | Status: DC
  Administered 2021-12-01: 50 ug via INTRAVENOUS

## 2021-12-01 MED ORDER — DOCUSATE SODIUM 100 MG PO CAPS
100.0000 mg | ORAL_CAPSULE | Freq: Two times a day (BID) | ORAL | Status: DC
  Administered 2021-12-01 – 2021-12-03 (×2): 100 mg via ORAL
  Filled 2021-12-01 (×3): qty 1

## 2021-12-01 MED ORDER — WATER FOR IRRIGATION, STERILE IR SOLN
Status: DC | PRN
  Administered 2021-12-01: 2000 mL

## 2021-12-01 MED ORDER — ONDANSETRON HCL 4 MG/2ML IJ SOLN: 4.0000 mg | Freq: Four times a day (QID) | INTRAMUSCULAR | Status: DC | PRN

## 2021-12-01 MED ORDER — 0.9 % SODIUM CHLORIDE (POUR BTL) OPTIME
TOPICAL | Status: DC | PRN
  Administered 2021-12-01: 1000 mL

## 2021-12-01 MED ORDER — BUPIVACAINE HCL (PF) 0.5 % IJ SOLN
INTRAMUSCULAR | Status: DC | PRN
  Administered 2021-12-01: 2.5 mL via INTRATHECAL

## 2021-12-01 MED ORDER — PHENYLEPHRINE HCL-NACL 20-0.9 MG/250ML-% IV SOLN
INTRAVENOUS | Status: DC | PRN
  Administered 2021-12-01: 50 ug/min via INTRAVENOUS

## 2021-12-01 MED ORDER — CHLORHEXIDINE GLUCONATE 0.12 % MT SOLN: 15.0000 mL | Freq: Once | OROMUCOSAL | Status: DC

## 2021-12-01 MED ORDER — HYDROMORPHONE HCL 1 MG/ML IJ SOLN: 0.2500 mg | Freq: Once | INTRAMUSCULAR | Status: DC

## 2021-12-01 MED ORDER — ORAL CARE MOUTH RINSE: 15.0000 mL | Freq: Once | OROMUCOSAL | Status: DC

## 2021-12-01 MED ORDER — PROPOFOL 10 MG/ML IV BOLUS
INTRAVENOUS | Status: DC | PRN
  Administered 2021-12-01 (×7): 20 mg via INTRAVENOUS

## 2021-12-01 MED ORDER — FENTANYL CITRATE (PF) 100 MCG/2ML IJ SOLN
INTRAMUSCULAR | Status: DC | PRN
  Administered 2021-12-01 (×4): 25 ug via INTRAVENOUS

## 2021-12-01 MED ORDER — OXYCODONE HCL 5 MG PO TABS
5.0000 mg | ORAL_TABLET | ORAL | Status: DC | PRN
  Administered 2021-12-02: 07:00:00 10 mg via ORAL
  Filled 2021-12-01: qty 2

## 2021-12-01 MED ORDER — SODIUM CHLORIDE 0.9 % IV SOLN
INTRAVENOUS | Status: DC | PRN
  Administered 2021-12-01: 80 mL

## 2021-12-01 MED ORDER — ROPIVACAINE HCL 7.5 MG/ML IJ SOLN
INTRAMUSCULAR | Status: DC | PRN
  Administered 2021-12-01: 20 mL via PERINEURAL

## 2021-12-01 MED ORDER — PROPOFOL 500 MG/50ML IV EMUL
INTRAVENOUS | Status: DC | PRN
  Administered 2021-12-01: 75 ug/kg/min via INTRAVENOUS

## 2021-12-01 MED ORDER — HYDROMORPHONE HCL 1 MG/ML IJ SOLN
0.5000 mg | INTRAMUSCULAR | Status: DC | PRN
  Administered 2021-12-02 (×2): 0.5 mg via INTRAVENOUS
  Administered 2021-12-02: 1 mg via INTRAVENOUS
  Filled 2021-12-01 (×3): qty 1

## 2021-12-01 MED ORDER — MIDAZOLAM HCL 2 MG/2ML IJ SOLN: 1.0000 mg | INTRAMUSCULAR | Status: DC

## 2021-12-01 MED ORDER — PHENOL 1.4 % MT LIQD: 1.0000 | OROMUCOSAL | Status: DC | PRN

## 2021-12-01 MED ORDER — FENTANYL CITRATE PF 50 MCG/ML IJ SOSY
25.0000 ug | PREFILLED_SYRINGE | INTRAMUSCULAR | Status: DC | PRN
  Administered 2021-12-01 (×3): 50 ug via INTRAVENOUS

## 2021-12-01 MED ORDER — METOPROLOL TARTRATE 12.5 MG HALF TABLET
12.5000 mg | ORAL_TABLET | Freq: Every day | ORAL | Status: DC
  Administered 2021-12-03: 12.5 mg via ORAL
  Filled 2021-12-01: qty 1

## 2021-12-01 MED ORDER — FENTANYL CITRATE PF 50 MCG/ML IJ SOSY
PREFILLED_SYRINGE | INTRAMUSCULAR | Status: AC
  Filled 2021-12-01: qty 2

## 2021-12-01 MED ORDER — DIPHENHYDRAMINE HCL 12.5 MG/5ML PO ELIX: 12.5000 mg | ORAL_SOLUTION | ORAL | Status: DC | PRN

## 2021-12-01 MED ORDER — EPHEDRINE 5 MG/ML INJ
INTRAVENOUS | Status: AC
  Filled 2021-12-01: qty 5

## 2021-12-01 MED ORDER — SODIUM CHLORIDE (PF) 0.9 % IJ SOLN
INTRAMUSCULAR | Status: AC
  Filled 2021-12-01: qty 50

## 2021-12-01 MED ORDER — SODIUM CHLORIDE 0.9 % IV SOLN: INTRAVENOUS | Status: DC

## 2021-12-01 MED ORDER — MIDAZOLAM HCL 2 MG/2ML IJ SOLN
1.0000 mg | Freq: Once | INTRAMUSCULAR | Status: AC
  Administered 2021-12-01: 1 mg via INTRAVENOUS

## 2021-12-01 MED ORDER — HYDROMORPHONE HCL 1 MG/ML IJ SOLN
0.5000 mg | INTRAMUSCULAR | Status: AC | PRN
  Administered 2021-12-01 (×4): 0.5 mg via INTRAVENOUS

## 2021-12-01 MED ORDER — OXYCODONE HCL 5 MG PO TABS
10.0000 mg | ORAL_TABLET | Freq: Once | ORAL | Status: AC
  Administered 2021-12-01: 10 mg via ORAL

## 2021-12-01 MED ORDER — MIRTAZAPINE 15 MG PO TABS
30.0000 mg | ORAL_TABLET | Freq: Every day | ORAL | Status: DC
Start: 2021-12-01 — End: 2021-12-03
  Administered 2021-12-01 – 2021-12-02 (×2): 30 mg via ORAL
  Filled 2021-12-01 (×2): qty 2

## 2021-12-01 MED ORDER — FENTANYL CITRATE (PF) 100 MCG/2ML IJ SOLN
INTRAMUSCULAR | Status: AC
  Filled 2021-12-01: qty 2

## 2021-12-01 MED ORDER — SODIUM CHLORIDE (PF) 0.9 % IJ SOLN
INTRAMUSCULAR | Status: AC
  Filled 2021-12-01: qty 10

## 2021-12-01 MED ORDER — ATORVASTATIN CALCIUM 40 MG PO TABS
40.0000 mg | ORAL_TABLET | Freq: Every day | ORAL | Status: DC
  Administered 2021-12-02: 18:00:00 40 mg via ORAL
  Filled 2021-12-01: qty 1

## 2021-12-01 MED ORDER — ACETAMINOPHEN 10 MG/ML IV SOLN
INTRAVENOUS | Status: AC
  Filled 2021-12-01: qty 100

## 2021-12-01 SURGICAL SUPPLY — 78 items
ANCHOR JUGGERKNOT W/DRL 2/1.45 (Anchor) ×2 IMPLANT
AUG FEM DIST PS 7X15 (Joint) ×3 IMPLANT
AUGMENT FEM DIST PS 7X15 (Joint) IMPLANT
BAG COUNTER SPONGE SURGICOUNT (BAG) IMPLANT
BAG SURGICOUNT SPONGE COUNTING (BAG)
BLADE HEX COATED 2.75 (ELECTRODE) ×3 IMPLANT
BLADE SAG 18X100X1.27 (BLADE) ×3 IMPLANT
BLADE SAW SAG 35X64 .89 (BLADE) ×3 IMPLANT
BLADE SAW SGTL 81X20 HD (BLADE) ×2 IMPLANT
BLOCK HALF TIB REV PS CD RL 5 (Joint) ×2 IMPLANT
BLOCK HALF TIB REV PS CD RM 5 (Joint) ×2 IMPLANT
BNDG ELASTIC 6X10 VLCR STRL LF (GAUZE/BANDAGES/DRESSINGS) ×3 IMPLANT
BOWL SMART MIX CTS (DISPOSABLE) ×3 IMPLANT
CEMENT BONE R 1X40 (Cement) ×8 IMPLANT
CHLORAPREP W/TINT 26 (MISCELLANEOUS) ×6 IMPLANT
COMP FEM CMT PS PLUS 7 RT (Joint) ×3 IMPLANT
COMP TIB PERS KNEE SZ D RT (Joint) ×3 IMPLANT
COMPONENT FEM CMT PS PLUS 7 RT (Joint) IMPLANT
COMPONENT TIB PERS KN SZD RT (Joint) IMPLANT
COVER SURGICAL LIGHT HANDLE (MISCELLANEOUS) ×3 IMPLANT
CUFF TOURN SGL QUICK 34 (TOURNIQUET CUFF) ×2
CUFF TRNQT CYL 34X4.125X (TOURNIQUET CUFF) ×1 IMPLANT
DERMABOND ADVANCED (GAUZE/BANDAGES/DRESSINGS) ×2
DERMABOND ADVANCED .7 DNX12 (GAUZE/BANDAGES/DRESSINGS) ×1 IMPLANT
DRAPE INCISE IOBAN 66X45 STRL (DRAPES) IMPLANT
DRAPE INCISE IOBAN 85X60 (DRAPES) ×3 IMPLANT
DRAPE SHEET LG 3/4 BI-LAMINATE (DRAPES) ×3 IMPLANT
DRAPE U-SHAPE 47X51 STRL (DRAPES) ×3 IMPLANT
DRESSING AQUACEL AG SP 3.5X10 (GAUZE/BANDAGES/DRESSINGS) ×1 IMPLANT
DRESSING PEEL AND PLAC PRVNA20 (GAUZE/BANDAGES/DRESSINGS) IMPLANT
DRSG AQUACEL AG SP 3.5X10 (GAUZE/BANDAGES/DRESSINGS) ×3
DRSG PEEL AND PLACE PREVENA 20 (GAUZE/BANDAGES/DRESSINGS) ×3
GLOVE SRG 8 PF TXTR STRL LF DI (GLOVE) ×1 IMPLANT
GLOVE SURG ENC MOIS LTX SZ8 (GLOVE) ×6 IMPLANT
GLOVE SURG UNDER POLY LF SZ8 (GLOVE) ×2
GOWN STRL REUS W/TWL XL LVL3 (GOWN DISPOSABLE) ×3 IMPLANT
HANDPIECE INTERPULSE COAX TIP (DISPOSABLE) ×2
HDLS TROCR DRIL PIN KNEE 75 (PIN) ×2
HOLDER FOLEY CATH W/STRAP (MISCELLANEOUS) IMPLANT
HOOD PEEL AWAY FLYTE STAYCOOL (MISCELLANEOUS) ×9 IMPLANT
INSERT TIB PS CMTLS CC XSM (Insert) ×2 IMPLANT
INSTR SCRW HEX REV FIX 3.5X48 (ORTHOPEDIC DISPOSABLE SUPPLIES) ×3
INSTRUMENT SCRW HEX REV 3.5X48 (ORTHOPEDIC DISPOSABLE SUPPLIES) IMPLANT
LINER TIB ASF PS CD/6-9 12 RT (Liner) ×2 IMPLANT
MANIFOLD NEPTUNE II (INSTRUMENTS) ×3 IMPLANT
MARKER SKIN DUAL TIP RULER LAB (MISCELLANEOUS) ×6 IMPLANT
NS IRRIG 1000ML POUR BTL (IV SOLUTION) ×3 IMPLANT
PACK TOTAL KNEE CUSTOM (KITS) ×3 IMPLANT
PIN DRILL HDLS TROCAR 75 4PK (PIN) IMPLANT
PROTECTOR NERVE ULNAR (MISCELLANEOUS) ×3 IMPLANT
SCREW HEX HEADED 3.5X27 DISP (ORTHOPEDIC DISPOSABLE SUPPLIES) ×2 IMPLANT
SET HNDPC FAN SPRY TIP SCT (DISPOSABLE) ×1 IMPLANT
SOLUTION IRRIG SURGIPHOR (IV SOLUTION) ×3 IMPLANT
SPIKE FLUID TRANSFER (MISCELLANEOUS) ×3 IMPLANT
SPONGE T-LAP 18X18 ~~LOC~~+RFID (SPONGE) ×9 IMPLANT
STEM FEM KNEE OFFSET 135X12X3 (Stem) ×2 IMPLANT
STEM FEM PROS OS 14X135X6 (Stem) ×2 IMPLANT
SUT ETHILON 3 0 PS 1 (SUTURE) ×14 IMPLANT
SUT MNCRL AB 3-0 PS2 18 (SUTURE) ×3 IMPLANT
SUT STRATAFIX 0 PDS 27 VIOLET (SUTURE) ×3
SUT STRATAFIX 1PDS 45CM VIOLET (SUTURE) ×3 IMPLANT
SUT STRATAFIX PDO 1 14 VIOLET (SUTURE) ×2
SUT STRATFX PDO 1 14 VIOLET (SUTURE) ×1
SUT VIC AB 0 CT1 36 (SUTURE) ×2 IMPLANT
SUT VIC AB 1 CT1 27 (SUTURE) ×4
SUT VIC AB 1 CT1 27XBRD ANTBC (SUTURE) IMPLANT
SUT VIC AB 2-0 CT1 27 (SUTURE) ×2
SUT VIC AB 2-0 CT1 TAPERPNT 27 (SUTURE) IMPLANT
SUT VIC AB 2-0 CT2 27 (SUTURE) ×6 IMPLANT
SUTURE STRATFX 0 PDS 27 VIOLET (SUTURE) ×1 IMPLANT
SUTURE STRATFX PDO 1 14 VIOLET (SUTURE) ×1 IMPLANT
SYR 50ML LL SCALE MARK (SYRINGE) ×3 IMPLANT
TRAY FOLEY MTR SLVR 14FR STAT (SET/KITS/TRAYS/PACK) IMPLANT
TRAY FOLEY MTR SLVR 16FR STAT (SET/KITS/TRAYS/PACK) IMPLANT
TUBE SUCTION HIGH CAP CLEAR NV (SUCTIONS) ×3 IMPLANT
WEDGE FEM ARTHROPLASTY SZ7 10 (Joint) ×2 IMPLANT
WEDGE FEM ARTHROPLASTY SZ7+ 10 (Joint) ×4 IMPLANT
WRAP KNEE MAXI GEL POST OP (GAUZE/BANDAGES/DRESSINGS) ×2 IMPLANT

## 2021-12-01 NOTE — Anesthesia Procedure Notes (Signed)
Spinal ? ?Patient location during procedure: OR ?Start time: 12/01/2021 12:00 PM ?End time: 12/01/2021 12:04 PM ?Staffing ?Performed: anesthesiologist  ?Anesthesiologist: Darral Dash, DO ?Preanesthetic Checklist ?Completed: patient identified, IV checked, site marked, risks and benefits discussed, surgical consent, monitors and equipment checked, pre-op evaluation and timeout performed ?Spinal Block ?Patient position: sitting ?Prep: DuraPrep ?Patient monitoring: heart rate, cardiac monitor, continuous pulse ox and blood pressure ?Approach: midline ?Location: L3-4 ?Injection technique: single-shot ?Needle ?Needle type: Quincke  ?Needle gauge: 22 G ?Needle length: 9 cm ?Assessment ?Events: CSF return ?Additional Notes ?Patient identified. Risks/Benefits/Options discussed with patient including but not limited to bleeding, infection, nerve damage, paralysis, failed block, incomplete pain control, headache, blood pressure changes, nausea, vomiting, reactions to medications, itching and postpartum back pain. Confirmed with bedside nurse the patient's most recent platelet count. Confirmed with patient that they are not currently taking any anticoagulation, have any bleeding history or any family history of bleeding disorders. Patient expressed understanding and wished to proceed. All questions were answered. Sterile technique was used throughout the entire procedure. Please see nursing notes for vital signs. Warning signs of high block given to the patient including shortness of breath, tingling/numbness in hands, complete motor block, or any concerning symptoms with instructions to call for help. Patient was given instructions on fall risk and not to get out of bed. All questions and concerns addressed with instructions to call with any issues or inadequate analgesia.   ?  ? ? ? ?

## 2021-12-01 NOTE — Progress Notes (Signed)
Orthopedic Tech Progress Note ?Patient Details:  ?Tina Dominguez ?Apr 23, 1941 ?[id number] ? ?Patient ID: [name], female   DOB: 12-05-1940, 81 y.o.   MRN: [id number] ? ?[name] ?12/01/2021, 5:06 PM ?Bone foam applied to right leg in pacu ?

## 2021-12-01 NOTE — Anesthesia Procedure Notes (Signed)
Anesthesia Regional Block: Adductor canal block  ? ?Pre-Anesthetic Checklist: , timeout performed,  Correct Patient, Correct Site, Correct Laterality,  Correct Procedure, Correct Position, site marked,  Risks and benefits discussed,  Surgical consent,  Pre-op evaluation,  At surgeon's request and post-op pain management ? ?Laterality: Right ? ?Prep: Dura Prep     ?  ?Needles:  ?Injection technique: Single-shot ? ?Needle Type: Echogenic Stimulator Needle   ? ? ?Needle Length: 10cm  ?Needle Gauge: 20  ? ? ? ?Additional Needles: ? ? ?Procedures:,,,, ultrasound used (permanent image in chart),,    ?Narrative:  ?Start time: 12/01/2021 10:42 AM ?End time: 12/01/2021 10:46 AM ?Injection made incrementally with aspirations every 5 mL. ? ?Performed by: Personally  ?Anesthesiologist: Darral Dash, DO ? ?Additional Notes: ?Patient identified. Risks/Benefits/Options discussed with patient including but not limited to bleeding, infection, nerve damage, failed block, incomplete pain control. Patient expressed understanding and wished to proceed. All questions were answered. Sterile technique was used throughout the entire procedure. Please see nursing notes for vital signs. Aspirated in 5cc intervals with injection for negative confirmation. Patient was given instructions on fall risk and not to get out of bed. All questions and concerns addressed with instructions to call with any issues or inadequate analgesia.   ?  ? ? ? ? ?

## 2021-12-01 NOTE — Progress Notes (Signed)
Assisted Dr. Rochele Pages with Right Knee Adductor Canal block. Side rails up, monitors on throughout procedure. See vital signs in flow sheet. Tolerated Procedure well. ? ?

## 2021-12-01 NOTE — Transfer of Care (Signed)
Immediate Anesthesia Transfer of Care Note ? ?Patient: Tina Dominguez ? ?Procedure(s) Performed: TOTAL KNEE ARTHROPLASTY WITH REVISION COMPONENTS (Right: Knee) ? ?Patient Location: PACU ? ?Anesthesia Type:Regional and Spinal ? ?Level of Consciousness: awake, alert  and oriented ? ?Airway & Oxygen Therapy: Patient Spontanous Breathing and Patient connected to face mask oxygen ? ?Post-op Assessment: Report given to RN and Post -op Vital signs reviewed and stable ? ?Post vital signs: Reviewed and stable ? ?Last Vitals:  ?Vitals Value Taken Time  ?BP 106/52 12/01/21 1550  ?Temp    ?Pulse 53 12/01/21 1552  ?Resp 14 12/01/21 1552  ?SpO2 98 % 12/01/21 1552  ?Vitals shown include unvalidated device data. ? ?Last Pain:  ?Vitals:  ? 12/01/21 0904  ?TempSrc: Oral  ?PainSc: 5   ?   ? ?Patients Stated Pain Goal: 4 (12/01/21 2952) ? ?Complications: No notable events documented. ?

## 2021-12-01 NOTE — Op Note (Signed)
DATE OF SURGERY:  12/01/2021 TIME: 4:03 PM  PATIENT NAME:  Tina Dominguez   AGE: 81 y.o.    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   OPERATIVE IMPLANTS:  Zimmer persona revision with hybrid fixation, size 7+ femoral component with 15 mm medial distal and 10 mm lateral distal augments, 5 mm posterior augments medial and lateral, 135 mm x 14 mm press-fit splined stem with 6 degree offset.  Size D revision tibial baseplate with 5 mm medial and lateral augment, 12 x 135 mm splined press-fit stem, extra small trabecular metal tibial central cone, 12 mm CPS polyethylene liner, juggernaut soft suture anchor size 2 Implant Name Type Inv. Item Serial No. Manufacturer Lot No. LRB No. Used Action  CEMENT BONE R 1X40 - [id number] Cement CEMENT BONE R 1X40  ZIMMER RECON(ORTH,TRAU,BIO,SG) [id number] Right 3 Implanted  COMP TIB PERS KNEE SZ D RT - [id number] Joint COMP TIB PERS KNEE SZ D RT  ZIMMER RECON(ORTH,TRAU,BIO,SG) [id number] Right 1 Implanted  STEM EXTENSION    Zimmer [id number] Right 1 Implanted  BLOCK HALF TIB REV PS CD RL 5 - [id number] Joint BLOCK HALF TIB REV PS CD RL 5  ZIMMER RECON(ORTH,TRAU,BIO,SG) [id number] Right 1 Implanted  BLOCK HALF TIB REV PS CD RM 5 - [id number] Joint BLOCK HALF TIB REV PS CD RM 5  ZIMMER RECON(ORTH,TRAU,BIO,SG) [id number] Right 1 Implanted  INSERT TIB PS CMTLS CC XSM - [id number] Insert INSERT TIB PS CMTLS CC XSM  ZIMMER RECON(ORTH,TRAU,BIO,SG) [id number] Right 1 Implanted  WEDGE FEM ARTHROPLASTY SZ7+ 10 - [id number] Joint WEDGE FEM ARTHROPLASTY SZ7+ 10  ZIMMER RECON(ORTH,TRAU,BIO,SG) [id number] Right 1 Implanted  WEDGE FEM ARTHROPLASTY SZ7+ 10 - [id number] Joint WEDGE FEM ARTHROPLASTY  SZ7+ 10  ZIMMER RECON(ORTH,TRAU,BIO,SG) [id number] Right 1 Implanted  WEDGE FEM ARTHROPLASTY SZ7 10 - [id number] Joint WEDGE FEM ARTHROPLASTY SZ7 10  ZIMMER RECON(ORTH,TRAU,BIO,SG) [id number] Right 1 Implanted  FEMORAL DISTAL AUGMENT    Zimmer [id number] Right 1 Implanted  COMP FEM CMT PS PLUS 7 RT - [id number] Joint COMP FEM CMT PS PLUS 7 RT  ZIMMER RECON(ORTH,TRAU,BIO,SG) [id number] Right 1 Implanted  STEM FEM PROS OS [id number] - [id number] Stem STEM FEM PROS OS [id number]  ZIMMER RECON(ORTH,TRAU,BIO,SG) [id number] Right 1 Implanted  ANCHOR JUGGERKNOT W/DRL 2/1.45 - [id number] Anchor ANCHOR JUGGERKNOT W/DRL 2/1.45  ZIMMER RECON(ORTH,TRAU,BIO,SG) [id number] Right 1 Implanted  CROSSLINKED POLYETHYLENE ARTICULAR SURFACE FIXED BEARING    Zimmer [id number] Right 1 Implanted      PREOPERATIVE INDICATIONS:  Tina Dominguez is a 81 y.o. year old female who had undergone total knee arthroplasty in 2008 on the right knee in a done well for many years.  She prefers presented my clinic a couple months ago with about 6 to 9 months of atraumatic progressively worsening pain in the right knee and instability.  Significant start of pain.  A bone scan was concerning for loosening of the tibial tray.  An x-ray suggested some cement bone debonding and separation under the tibial tray.  Additionally she got additional x-rays more recently that suggested severe poly wear about the posterior aspect of her knee replacement.  She also had worsening clunk and instability with range of motion suggesting that her rotating platform poly had either broken or severely worn.  Aspiration done in clinic  of her right knee was sent for Synovasure with negative alpha defensin, 800 nucleated cells with only 28% neutrophils, cultures were also all negative.  This was all reassuring against prosthetic joint infection.  Next we diagnosed her with implant failure and aseptic loosening of her right knee replacement was indicated for full total knee revision.    The risks, benefits, and alternatives were discussed at length including but not limited to the risks of infection, bleeding, nerve injury, stiffness, blood clots, the need for revision surgery, cardiopulmonary complications, among others, and they were willing to proceed.  OPERATIVE FINDINGS AND UNIQUE ASPECTS OF THE CASE: Severe PolyWear and damage of her rotating platform polyethylene liner.  Cement bone debonding of her tibial baseplate.  ESTIMATED BLOOD LOSS: 200cc  OPERATIVE DESCRIPTION:  The leg was  exsanguinated, tourniquet elevated to 250 mmHg.  A midline incision was made utilizing his old scar.  Residual Ethibond from prior arthrotomy was visualized and we made our median parapatellar arthrotomy through the old scar.  A medial and lateral synovectomy was performed.  There was severe synovitis from the polyethylene wear.  This was all resected.  We next performed a medial release off the tibial plateau with Bovie cautery.  Infrapatellar fat pad was resected.    Suprapatellar fat pad was resected.  Next the knee was flexed up to 90 degrees and using a small osteotome of the polyethylene liner was removed.  The tip of the post was partially broken as well as severe wear on the posterior medial and lateral aspects of the polyethylene liner.   The polyethylene of the patella was noted to be in good condition with only about a millimeter of superficial wear along the superior pole of the implant.  No concerns for loosening or malposition.  We elected to retain the patellar component.   We next turned our attention to explanting the femoral and tibial components.  The peripheral edge was exposed circumferentially around the femoral component using Bovie cautery and a rongeur.  Next using quarter inch straight osteotome and a small ACL blade saw.  We were able to break up the implant cement interface circumferentially.  Next using a bone tamp and mallet we were able to easily remove the femoral  component.  Minimal bone loss was noted.  The residual bone was notably of poor quality very soft and not supportive.   Next we turned our attention to explanting the tibial component.  The tibia was also exposed circumferentially and using a ACL saw blade and quarter inch osteotome we were able to break up the implant cement interface.  The tibial component was noted to be loose as it started to mobilize with minimal manipulation of the component.  The tibial component was able to be extracted also with minimal bone loss.    Using an osteotome, splitter, pituitary rongeurs, the residual cement bone plug was able to be removed from the tibial canal.  The tibial canal was cleaned and irrigated with a back scratcher.     We next turned our attention to reaming the canal some preparation for our press-fit stems.  On the femoral side we reamed to a 14 mm x 135 mm stem which had good cortical fit and contact.  The tibial side we reamed to a 12 mm x 135 mm stem which also had excellent cortical contact.    We then performed a freshen up cut of the proximal tibia at 0 degrees.  There was a moderate central defect  but with this resection there was good supportive cancellous and cortical bone circumferentially.  We then sized the tibial component and felt with proper rotation a D provided optimal coverage with appropriate external rotation.  Given the offset of the tibial tray from the stem we used a 3 degree offset.  Next we prepared the tibial surface for a cone.  Overall we had good tibial bone following our proximal tibia recut, but given the central defect elected to broach for a central cone.  We broached sequentially up to an extra small cone which had good fit and rotational stability.   Preparing the femoral canal.  We reamed sequentially up to 17 mm at which point had a tight fit further reamer.  We performed a freshen up cut of the distal femur.  To resect the distal part of bone which required a 5 mm  resection laterally and a 10 mm resection medially.  To restore the distal joint line we elected for 10 mm distal augment laterally and 15 mm distal augment medially.   The medial epicondyle to the end of the bone cut measured 20 mm, and the lateral epicondyle to the end of the lateral bone cut measured 15 mm, thus we felt distal femoral augments were not indicated and felt that our joint line would be restored toward body additional 10 mm from the implant.  Next we turned our attention to femoral sizing.  Comparing to her old implant as well as preoperative templating we felt a 7 femur matched her prior implant.  A 7 cutting block with the appropriate distal augments was placed. Given good patellar tracking preoperatively we try to match to the rotation of the prior component as well as utilizing the transepicondylar axis.  We had to medialize and posterior as the component using a 46m offset.  We pinned the size 7 cutting block in place.  We had to cut for 5 mm augments both medially and laterally to obtain supportive posterior bone.     Next the trial tibia and femoral components were assembled on the back table.   We trialed with a  PS up to a 18 mm poly which had about 1 to 2 mm opening medially and laterally in extension.  At about 5 to 6 mm of laxity A to P in flexion.  We elected at 5 mm medial and lateral tibial augments so that we could use a smaller polyethylene liner.  Additionally we elected to use a plus femur to give uK[place]extra tissue now 3 mm of posterior offset on the femoral component to give uKoreaincreased flexion stability.  These implants were then trialed and found to have excellent stability in both flexion and extension, utilizing a 12 mm PS poly.  The tourniquet was then let down so that I could be reinflated for cementing.     The real components were then opened and assembled on the back table.  The knee was thoroughly irrigated. After about 15 minutes of being let down, the  tourniquet was reinflated for cementing. The real cone was then inserted. The tibial and femoral components were cemented using a total of 3 batches of cement.  The knee was then brought into extension once the implants were implanted and excess cement was removed. The knee was irrigated with dilute Betadine as well as normal saline solution and pulse lavage.  The synovial lining was  then injected a dilute Exparel.  The 12 mm trial liner had good range of  motion full extension and flexion but again mildly increased lateral laxity in both flexion extension so elected to use a 12 CPS insert.  At this point we were pleased with our flexion extension gaps and range of motion, and patellar tracking was excellent.  The patellar tendon was intact however there was noted to be about one third of the medial aspect had peeled laterally.  We used a Biomet juggernaut suture anchor to tack down the medial portion of the patella tendon.         The tourniquet was again let down.  No significant  hemostasis was required.  The medial parapatellar arthrotomy was then reapproximated using #1 Vicryl and #1 Stratafix sutures with the knee in 30 degrees of flexion.  The  remaining wound was closed with 0 stratafix, 2-0 Vicryl, and interrupted 3-0 Nylon  The knee was cleaned, dried, dressed with a Prevena incisional wound VAC.  The patient was then  brought to recovery room in stable condition, tolerating the procedure  well. There were no complications.   Post op recs: WB: WBAT Abx: ancef while in house, then dc on cefadroxil 500 BID x7 days for extended abx prophylaxis Imaging: PACU xrays DVT prophylaxis: Xarelto 10 mg postop day 1-2, resume full dose Xarelto 20 mg postop day 3 Follow up: 2 weeks after surgery for a wound check with Dr. Zachery Dakins at [facility].  Address: [address], [place], [place] [address]  Office Phone: [phone]  Charlies Constable, MD Orthopaedic  Surgery

## 2021-12-01 NOTE — Anesthesia Postprocedure Evaluation (Signed)
Anesthesia Post Note ? ?Patient: Tina Dominguez ? ?Procedure(s) Performed: TOTAL KNEE ARTHROPLASTY WITH REVISION COMPONENTS (Right: Knee) ? ?  ? ?Patient location during evaluation: PACU ?Anesthesia Type: Regional and Spinal ?Level of consciousness: awake and alert, patient cooperative and oriented ?Pain management: pain level controlled (pain much improved) ?Vital Signs Assessment: post-procedure vital signs reviewed and stable ?Respiratory status: nonlabored ventilation, spontaneous breathing, respiratory function stable and patient connected to nasal cannula oxygen ?Cardiovascular status: blood pressure returned to baseline and stable ?Postop Assessment: no apparent nausea or vomiting and patient able to bend at knees ?Anesthetic complications: no ? ? ?No notable events documented. ? ?Last Vitals:  ?Vitals:  ? 12/01/21 1800 12/01/21 1815  ?BP: 111/75 136/67  ?Pulse: (!) 58 63  ?Resp: 20 11  ?Temp:    ?SpO2: 97% 97%  ?  ?Last Pain:  ?Vitals:  ? 12/01/21 1815  ?TempSrc:   ?PainSc: 6   ? ? ?LLE Motor Response: Purposeful movement (12/01/21 1815) ?  ?RLE Motor Response: Purposeful movement (12/01/21 1815) ?  ?L Sensory Level: S1-Sole of foot, small toes (12/01/21 1815) ?R Sensory Level: S1-Sole of foot, small toes (12/01/21 1815) ? ?Robert Sunga,E. Braven Wolk ? ? ? ? ?

## 2021-12-01 NOTE — Discharge Instructions (Signed)
INSTRUCTIONS AFTER JOINT REPLACEMENT  ° °Remove items at home which could result in a fall. This includes throw rugs or furniture in walking pathways °ICE to the affected joint every three hours while awake for 30 minutes at a time, for at least the first 3-5 days, and then as needed for pain and swelling.  Continue to use ice for pain and swelling. You may notice swelling that will progress down to the foot and ankle.  This is normal after surgery.  Elevate your leg when you are not up walking on it.   °Continue to use the breathing machine you got in the hospital (incentive spirometer) which will help keep your temperature down.  It is common for your temperature to cycle up and down following surgery, especially at night when you are not up moving around and exerting yourself.  The breathing machine keeps your lungs expanded and your temperature down. ° ° °DIET:  As you were doing prior to hospitalization, we recommend a well-balanced diet. ° °DRESSING / WOUND CARE / SHOWERING ° °Keep the surgical dressing until follow up.  The dressing is water proof, so you can shower without any extra covering.  IF THE DRESSING FALLS OFF or the wound gets wet inside, change the dressing with sterile gauze.  Please use good hand washing techniques before changing the dressing.  Do not use any lotions or creams on the incision until instructed by your surgeon.   ° °ACTIVITY ° °Increase activity slowly as tolerated, but follow the weight bearing instructions below.   °No driving for 6 weeks or until further direction given by your physician.  You cannot drive while taking narcotics.  °No lifting or carrying greater than 10 lbs. until further directed by your surgeon. °Avoid periods of inactivity such as sitting longer than an hour when not asleep. This helps prevent blood clots.  °You may return to work once you are authorized by your doctor.  ° ° ° °WEIGHT BEARING  ° °Weight bearing as tolerated with assist device (walker, cane,  etc) as directed, use it as long as suggested by your surgeon or therapist, typically at least 4-6 weeks. ° ° °EXERCISES ° °Results after joint replacement surgery are often greatly improved when you follow the exercise, range of motion and muscle strengthening exercises prescribed by your doctor. Safety measures are also important to protect the joint from further injury. Any time any of these exercises cause you to have increased pain or swelling, decrease what you are doing until you are comfortable again and then slowly increase them. If you have problems or questions, call your caregiver or physical therapist for advice.  ° °Rehabilitation is important following a joint replacement. After just a few days of immobilization, the muscles of the leg can become weakened and shrink (atrophy).  These exercises are designed to build up the tone and strength of the thigh and leg muscles and to improve motion. Often times heat used for twenty to thirty minutes before working out will loosen up your tissues and help with improving the range of motion but do not use heat for the first two weeks following surgery (sometimes heat can increase post-operative swelling).  ° °These exercises can be done on a training (exercise) mat, on the floor, on a table or on a bed. Use whatever works the best and is most comfortable for you.    Use music or television while you are exercising so that the exercises are a pleasant break in your   day. This will make your life better with the exercises acting as a break in your routine that you can look forward to.   Perform all exercises about fifteen times, three times per day or as directed.  You should exercise both the operative leg and the other leg as well.  Exercises include:   Quad Sets - Tighten up the muscle on the front of the thigh (Quad) and hold for 5-10 seconds.   Straight Leg Raises - With your knee straight (if you were given a brace, keep it on), lift the leg to 60  degrees, hold for 3 seconds, and slowly lower the leg.  Perform this exercise against resistance later as your leg gets stronger.  Leg Slides: Lying on your back, slowly slide your foot toward your buttocks, bending your knee up off the floor (only go as far as is comfortable). Then slowly slide your foot back down until your leg is flat on the floor again.  Angel Wings: Lying on your back spread your legs to the side as far apart as you can without causing discomfort.  Hamstring Strength:  Lying on your back, push your heel against the floor with your leg straight by tightening up the muscles of your buttocks.  Repeat, but this time bend your knee to a comfortable angle, and push your heel against the floor.  You may put a pillow under the heel to make it more comfortable if necessary.   A rehabilitation program following joint replacement surgery can speed recovery and prevent re-injury in the future due to weakened muscles. Contact your doctor or a physical therapist for more information on knee rehabilitation.    CONSTIPATION  Constipation is defined medically as fewer than three stools per week and severe constipation as less than one stool per week.  Even if you have a regular bowel pattern at home, your normal regimen is likely to be disrupted due to multiple reasons following surgery.  Combination of anesthesia, postoperative narcotics, change in appetite and fluid intake all can affect your bowels.   YOU MUST use at least one of the following options; they are listed in order of increasing strength to get the job done.  They are all available over the counter, and you may need to use some, POSSIBLY even all of these options:    Drink plenty of fluids (prune juice may be helpful) and high fiber foods Colace 100 mg by mouth twice a day  Senokot for constipation as directed and as needed Dulcolax (bisacodyl), take with full glass of water  Miralax (polyethylene glycol) once or twice a day as  needed.  If you have tried all these things and are unable to have a bowel movement in the first 3-4 days after surgery call either your surgeon or your primary doctor.    If you experience loose stools or diarrhea, hold the medications until you stool forms back up.  If your symptoms do not get better within 1 week or if they get worse, check with your doctor.  If you experience "the worst abdominal pain ever" or develop nausea or vomiting, please contact the office immediately for further recommendations for treatment.   ITCHING:  If you experience itching with your medications, try taking only a single pain pill, or even half a pain pill at a time.  You can also use Benadryl over the counter for itching or also to help with sleep.   TED HOSE STOCKINGS:  Use stockings on both  legs until for at least 2 weeks or as directed by physician office. They may be removed at night for sleeping.  MEDICATIONS:  See your medication summary on the After Visit Summary that nursing will review with you.  You may have some home medications which will be placed on hold until you complete the course of blood thinner medication.  It is important for you to complete the blood thinner medication as prescribed.   Blood clot prevention (DVT Prophylaxis): After surgery you are at an increased risk for a blood clot. We have resumed your Xarelto to help reduce your risk of getting a blood clot. This will help prevent a blood clot. Signs of a pulmonary embolus (blood clot in the lungs) include sudden short of breath, feeling lightheaded or dizzy, chest pain with a deep breath, rapid pulse rapid breathing. Signs of a blood clot in your arms or legs include new unexplained swelling and cramping, warm, red or darkened skin around the painful area. Please call the office or 911 right away if these signs or symptoms develop.  PRECAUTIONS:  If you experience chest pain or shortness of breath - call 911 immediately for transfer to  the hospital emergency department.   If you develop a fever greater that 101 F, purulent drainage from wound, increased redness or drainage from wound, foul odor from the wound/dressing, or calf pain - CONTACT YOUR SURGEON.                                                   FOLLOW-UP APPOINTMENTS:  If you do not already have a post-op appointment, please call the office for an appointment to be seen by your surgeon.  Guidelines for how soon to be seen are listed in your After Visit Summary, but are typically between 2-3 weeks after surgery.  OTHER INSTRUCTIONS:   Knee Replacement:  Do not place pillow under knee, focus on keeping the knee straight while resting.    POST-OPERATIVE OPIOID TAPER INSTRUCTIONS: It is important to wean off of your opioid medication as soon as possible. If you do not need pain medication after your surgery it is ok to stop day one. Opioids include: Codeine, Hydrocodone(Norco, Vicodin), Oxycodone(Percocet, oxycontin) and hydromorphone amongst others.  Long term and even short term use of opiods can cause: Increased pain response Dependence Constipation Depression Respiratory depression And more.  Withdrawal symptoms can include Flu like symptoms Nausea, vomiting And more Techniques to manage these symptoms Hydrate well Eat regular healthy meals Stay active Use relaxation techniques(deep breathing, meditating, yoga) Do Not substitute Alcohol to help with tapering If you have been on opioids for less than two weeks and do not have pain than it is ok to stop all together.  Plan to wean off of opioids This plan should start within one week post op of your joint replacement. Maintain the same interval or time between taking each dose and first decrease the dose.  Cut the total daily intake of opioids by one tablet each day Next start to increase the time between doses. The last dose that should be eliminated is the evening dose.   MAKE SURE YOU:   Understand these instructions.  Get help right away if you are not doing well or get worse.    Thank you for letting us be a part of your medical  care team.  It is a privilege we respect greatly.  We hope these instructions will help you stay on track for a fast and full recovery!

## 2021-12-01 NOTE — Interval H&P Note (Signed)
The patient has been re-examined, and the chart reviewed, and there have been no interval changes to the documented history and physical.   ? ?Plan for complete explant of failed right TKA and implant of revision TKA. ? ?The operative side was examined and the patient was confirmed to have. Sens DPN, SPN, TN intact, Motor EHL, ext, flex 5/5, and DP 2+, PT 2+, No significant edema. ? ? ?The risks, benefits, and alternatives have been discussed at length with patient, and the patient is willing to proceed.  Right knee marked. Consent has been signed. ? ?

## 2021-12-02 LAB — CBC
HCT: 31.7 % — ABNORMAL LOW (ref 36.0–46.0)
Hemoglobin: 9.8 g/dL — ABNORMAL LOW (ref 12.0–15.0)
MCH: 28.5 pg (ref 26.0–34.0)
MCHC: 30.9 g/dL (ref 30.0–36.0)
MCV: 92.2 fL (ref 80.0–100.0)
Platelets: 240 10*3/uL (ref 150–400)
RBC: 3.44 MIL/uL — ABNORMAL LOW (ref 3.87–5.11)
RDW: 13.4 % (ref 11.5–15.5)
WBC: 10.2 10*3/uL (ref 4.0–10.5)
nRBC: 0 % (ref 0.0–0.2)

## 2021-12-02 LAB — BASIC METABOLIC PANEL
Anion gap: 4 — ABNORMAL LOW (ref 5–15)
BUN: 17 mg/dL (ref 8–23)
CO2: 25 mmol/L (ref 22–32)
Calcium: 8.9 mg/dL (ref 8.9–10.3)
Chloride: 107 mmol/L (ref 98–111)
Creatinine, Ser: 0.87 mg/dL (ref 0.44–1.00)
GFR, Estimated: >60 mL/min (ref >60)
Glucose, Bld: 166 mg/dL — ABNORMAL HIGH (ref 70–99)
Potassium: 4.7 mmol/L (ref 3.5–5.1)
Sodium: 136 mmol/L (ref 135–145)

## 2021-12-02 MED ORDER — KETOROLAC TROMETHAMINE 15 MG/ML IJ SOLN
7.5000 mg | Freq: Once | INTRAMUSCULAR | Status: AC
  Administered 2021-12-02: 12:00:00 7.5 mg via INTRAVENOUS
  Filled 2021-12-02: qty 1

## 2021-12-02 MED ORDER — METHOCARBAMOL 500 MG PO TABS
500.0000 mg | ORAL_TABLET | Freq: Three times a day (TID) | ORAL | Status: DC | PRN
  Administered 2021-12-02 – 2021-12-03 (×4): 500 mg via ORAL
  Filled 2021-12-02 (×4): qty 1

## 2021-12-02 MED ORDER — KETOROLAC TROMETHAMINE 15 MG/ML IJ SOLN
7.5000 mg | Freq: Three times a day (TID) | INTRAMUSCULAR | Status: AC
  Administered 2021-12-02 – 2021-12-03 (×2): 7.5 mg via INTRAVENOUS
  Filled 2021-12-02 (×2): qty 1

## 2021-12-02 NOTE — Progress Notes (Signed)
? ? ? ?  Subjective: ? ?Patient reports pain as moderately controlled.  She is on Oxy 10 mg at home at baseline for many years.  She denies distal numbness and tingling.  Discussed plan for physical therapy today.  Dispo pending progress with physical therapy. ? ?Objective:  ? ?VITALS:   ?Vitals:  ? 12/01/21 1826 12/01/21 2049 12/02/21 0138 12/02/21 3338  ?BP: 133/67 123/62 (!) 118/49 (!) 126/56  ?Pulse: 63 61 61 66  ?Resp: '16 17 17 17  '$ ?Temp: 98.4 ?F (36.9 ?C) 97.8 ?F (36.6 ?C) 97.9 ?F (36.6 ?C) 98.2 ?F (36.8 ?C)  ?TempSrc: Oral     ?SpO2: 97% 97% 96% 95%  ?Weight:      ?Height:      ? ? ?Sensation intact distally ?Intact pulses distally ?Dorsiflexion/Plantar flexion intact ?Incision: dressing C/D/I, incisional wound VAC holding suction, no output. ?Compartment soft ?` ? ?Lab Results  ?Component Value Date  ? WBC 10.2 12/02/2021  ? HGB 9.8 (L) 12/02/2021  ? HCT 31.7 (L) 12/02/2021  ? MCV 92.2 12/02/2021  ? PLT 240 12/02/2021  ? ?BMET ?   ?Component Value Date/Time  ? NA 136 12/02/2021 0311  ? NA 143 12/13/2019 1346  ? K 4.7 12/02/2021 0311  ? CL 107 12/02/2021 0311  ? CO2 25 12/02/2021 0311  ? GLUCOSE 166 (H) 12/02/2021 0311  ? BUN 17 12/02/2021 0311  ? BUN 24 12/13/2019 1346  ? CREATININE 0.87 12/02/2021 0311  ? CALCIUM 8.9 12/02/2021 0311  ? GFRNONAA >60 12/02/2021 0311  ? ?Xray: Postop x-rays demonstrate revision knee arthroplasty components in good position without evidence of fracture. ? ?Assessment/Plan: ?1 Day Post-Op  ? ?Principal Problem: ?  Failed total knee arthroplasty ([place]) ? 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 ?Post op recs: ?WB: WBAT ?Abx: ancef while in house, then dc on cefadroxil 500 BID x7 days for extended abx prophylaxis ?Imaging: PACU xrays ?DVT prophylaxis: Xarelto 10 mg postop day 1-2, resume full dose Xarelto 20 mg postop day 3 ?Follow up: 2 weeks after surgery for a wound check with Dr. Zachery Dakins at [facility]  ?Address: [address] [place], [place], [place]  [address]  ?Office Phone: [phone] ? ? ? ?Janat Tabbert A Dajohn Ellender ?12/02/2021, 7:12 AM ? ? ?Charlies Constable, MD ? ?Contact information:   ?Weekdays 7am-5pm epic message Dr. Zachery Dakins, or call office for patient follow up: [phone] ?After hours and holidays please check [email or web address] for group call information for [facility] ? ?  ?

## 2021-12-02 NOTE — TOC Transition Note (Signed)
Transition of Care (TOC) - CM/SW Discharge Note ? ? ?Patient Details  ?Name: Tina Dominguez ?MRN: [id number] ?Date of Birth: 12-24-1940 ? ?Transition of Care (TOC) CM/SW Contact:  ?Dezirea Mccollister, LCSW ?Phone Number: ?12/02/2021, 9:28 AM ? ? ?Clinical Narrative:    ?Met with patient and confirming rw delivery to room via [facility].  Pt aware HHPT prearranged with [facility].  No further TOC needs. ? ? ?Final next level of care: [place] ?Barriers to Discharge: No Barriers Identified ? ? ?Patient Goals and CMS Choice ?Patient states their goals for this hospitalization and ongoing recovery are:: return home ?  ?  ? ?Discharge Placement ?  ?           ?  ?  ?  ?  ? ?Discharge Plan and Services ?  ?  ?           ?DME Arranged: Walker rolling ?DME Agency: [employer] ?  ?  ?  ?HH Arranged: PT ?[place] Agency: [facility] ?  ?  ?  ? ?Social Determinants of Health (SDOH) Interventions ?  ? ? ?Readmission Risk Interventions ?No flowsheet data found. ? ? ? ? ?

## 2021-12-02 NOTE — Evaluation (Signed)
Physical Therapy Evaluation Patient Details Name: Tina Dominguez MRN: [id number] DOB: 11/18/1940 Today's Date: 12/02/2021  History of Present Illness  Pt is a 81 y.o. female s/p R TKA revision. PMH significant for depression, CAD, CABG, DVT, PE, HLD, HTN, hypothyroidism, MI, OA, osteoporosis, stroke, GERD, CKD, anemia, anxiety, back surgery (2012), R shoulder surgery, and B TKA.  Clinical Impression  Pt is POD #1 s/p R TKA revision resulting in the deficits listed below (see PT Problem List). Pt performed sit to stand transfers with MIN guard for safety and cues for safe hand placement. Pt ambulated total of ~22f within room with MIN guard progressing to supervision for safety, limited with further distance due to increased pain levels. PT reviewed therapeutic interventions for promotion of DVT prevention and improved circulation, pt demonstrated understanding. Pt will have assist from her children upon d/c. Pt will benefit from skilled PT to maximize functional mobility to increase independence.         Recommendations for follow up therapy are one component of a multi-disciplinary discharge planning process, led by the attending physician.  Recommendations may be updated based on patient status, additional functional criteria and insurance authorization.  Follow Up Recommendations Follow physician's recommendations for discharge plan and follow up therapies    Assistance Recommended at Discharge Intermittent Supervision/Assistance  Patient can return home with the following       Equipment Recommendations Rolling walker (2 wheels)  Recommendations for Other Services       Functional Status Assessment       Precautions / Restrictions Precautions Precautions: Fall Precaution Comments: wound vac R LE Restrictions Weight Bearing Restrictions: No Other Position/Activity Restrictions: WBAT      Mobility  Bed Mobility Overal bed mobility: Needs Assistance Bed Mobility: Supine to  Sit     Supine to sit: Supervision, HOB elevated, Min guard     General bed mobility comments: MIN guard for support with R LE to groudn once off EOB due to pain    Transfers Overall transfer level: Needs assistance Equipment used: Rolling walker (2 wheels) Transfers: Sit to/from Stand Sit to Stand: Min guard           General transfer comment: [facility], [facility]; cues for safe hand placement, use of grab bar to rise from toilet. Increased discomfort while toileting- therapist resting bin under pt's foot to provide support while seated- reported improvement in symptoms from pt with support under foot.    Ambulation/Gait Ambulation/Gait assistance: Min guard, Supervision Gait Distance (Feet): 10 Feet Assistive device: Rolling walker (2 wheels) Gait Pattern/deviations: Step-to pattern, Decreased stride length, Step-through pattern, Decreased stance time - right, Antalgic, Wide base of support Gait velocity: decr     General Gait Details: additional 163ffrom bathroom to recliner chair. no overt LOB observed, cues for step to progressing to step through. Noted to have wide BOS and intermittently with L LE lagging behind R due to decrease stance time and stride length.  Stairs            Wheelchair Mobility    Modified Rankin (Stroke Patients Only)       Balance Overall balance assessment: Needs assistance Sitting-balance support: Feet supported Sitting balance-Leahy Scale: Fair     Standing balance support: Bilateral upper extremity supported, During functional activity Standing balance-Leahy Scale: Poor  Pertinent Vitals/Pain Pain Assessment Pain Assessment: 0-10 Pain Score: 10-Worst pain ever Pain Location: Rt knee (most pain posteriorly) Pain Descriptors / Indicators: Aching, Grimacing, Tender, Sore, Discomfort Pain Intervention(s): Limited activity within patient's tolerance, Monitored during  session, Repositioned, Ice applied    Home Living Family/patient expects to be discharged to:: Private residence Living Arrangements: Alone Available Help at Discharge: Family;Available 24 hours/day Type of Home: Mobile home (double wide trailer) Home Access: Ramped entrance;Stairs to enter (uses ramped entry) Entrance Stairs-Rails: Can reach both Entrance Stairs-Number of Steps: 2   Home Layout: One level Home Equipment: BSC/3in1;Shower seat - built in;Grab bars - toilet;Grab bars - tub/shower;Cane - quad;Rollator (4 wheels);Toilet riser Additional Comments: son retired lives across [place] has 3 daughters available PRN to assist upon d/c    Prior Function Prior Level of Function : Independent/Modified Independent;Driving             Mobility Comments: pt has been limited to ambulating primarily household distances over the last several months due to R knee pain. Prior to this she was independent for community mobility. been using quad cane in home, rollator for community       Hand Dominance        Extremity/Trunk Assessment   Upper Extremity Assessment Upper Extremity Assessment: Overall WFL for tasks assessed    Lower Extremity Assessment Lower Extremity Assessment: RLE deficits/detail RLE Deficits / Details: Fair quad set strength, difficulty performing SLR due to pain, B DF/PF strength 4/5 RLE: Unable to fully assess due to pain    Cervical / Trunk Assessment Cervical / Trunk Assessment: Normal  Communication   Communication: No difficulties  Cognition Arousal/Alertness: Awake/alert Behavior During Therapy: WFL for tasks assessed/performed Overall Cognitive Status: Within Functional Limits for tasks assessed                                          General Comments      Exercises Total Joint Exercises Ankle Circles/Pumps: AROM, Both, 20 reps, Seated Quad Sets: AROM, Right, 10 reps, Seated Heel Slides:  (attempted but unable to  perform due to pain)   Assessment/Plan    PT Assessment Patient needs continued PT services  PT Problem List Decreased strength;Decreased range of motion;Decreased activity tolerance;Decreased balance;Decreased mobility;Decreased knowledge of use of DME;Pain       PT Treatment Interventions DME instruction;Gait training;Functional mobility training;Therapeutic activities;Therapeutic exercise;Balance training;Patient/family education;Stair training    PT Goals (Current goals can be found in the Care Plan section)  Acute Rehab PT Goals Patient Stated Goal: decrease pain and be able to go home PT Goal Formulation: With patient/family Time For Goal Achievement: 12/16/21 Potential to Achieve Goals: Good    Frequency 7X/week     Co-evaluation               AM-PAC PT "6 Clicks" Mobility  Outcome Measure Help needed turning from your back to your side while in a flat bed without using bedrails?: A Little Help needed moving from lying on your back to sitting on the side of a flat bed without using bedrails?: A Little Help needed moving to and from a bed to a chair (including a wheelchair)?: A Little Help needed standing up from a chair using your arms (e.g., wheelchair or bedside chair)?: A Little Help needed to walk in hospital room?: A Little Help needed climbing 3-5 steps with a railing? : A  Lot 6 Click Score: 17    End of Session Equipment Utilized During Treatment: Gait belt Activity Tolerance: Patient tolerated treatment well;Patient limited by pain Patient left: in chair;with call bell/phone within reach;with chair alarm set;with family/visitor present Nurse Communication: Mobility status PT Visit Diagnosis: Unsteadiness on feet (R26.81);Muscle weakness (generalized) (M62.81);Pain;Other abnormalities of gait and mobility (R26.89);Difficulty in walking, not elsewhere classified (R26.2) Pain - Right/Left: Right Pain - part of body: Knee    Time: 1025-1100 PT Time  Calculation (min) (ACUTE ONLY): 35 min   Charges:   PT Evaluation $PT Eval Low Complexity: 1 Low PT Treatments $Therapeutic Activity: 8-22 mins        Festus Barren PT, DPT  Acute Rehabilitation Services  Office [phone]   12/02/2021, 12:12 PM

## 2021-12-03 LAB — CBC
HCT: 33.2 % — ABNORMAL LOW (ref 36.0–46.0)
Hemoglobin: 10.5 g/dL — ABNORMAL LOW (ref 12.0–15.0)
MCH: 28.7 pg (ref 26.0–34.0)
MCHC: 31.6 g/dL (ref 30.0–36.0)
MCV: 90.7 fL (ref 80.0–100.0)
Platelets: 292 10*3/uL (ref 150–400)
RBC: 3.66 MIL/uL — ABNORMAL LOW (ref 3.87–5.11)
RDW: 14.1 % (ref 11.5–15.5)
WBC: 11.5 10*3/uL — ABNORMAL HIGH (ref 4.0–10.5)
nRBC: 0 % (ref 0.0–0.2)

## 2021-12-03 LAB — BASIC METABOLIC PANEL
Anion gap: 8 (ref 5–15)
BUN: 21 mg/dL (ref 8–23)
CO2: 28 mmol/L (ref 22–32)
Calcium: 9 mg/dL (ref 8.9–10.3)
Chloride: 102 mmol/L (ref 98–111)
Creatinine, Ser: 0.95 mg/dL (ref 0.44–1.00)
GFR, Estimated: >60 mL/min (ref >60)
Glucose, Bld: 102 mg/dL — ABNORMAL HIGH (ref 70–99)
Potassium: 3.7 mmol/L (ref 3.5–5.1)
Sodium: 138 mmol/L (ref 135–145)

## 2021-12-03 MED ORDER — CEFADROXIL 500 MG PO CAPS: 500.0000 mg | ORAL_CAPSULE | Freq: Two times a day (BID) | ORAL | 0 refills | Status: AC

## 2021-12-03 MED ORDER — METHOCARBAMOL 500 MG PO TABS
500.0000 mg | ORAL_TABLET | Freq: Three times a day (TID) | ORAL | 0 refills | Status: AC | PRN
Start: 2021-12-03 — End: 2021-12-13

## 2021-12-03 MED ORDER — OXYCODONE HCL 5 MG PO TABS
10.0000 mg | ORAL_TABLET | Freq: Four times a day (QID) | ORAL | 0 refills | Status: AC | PRN
Start: 2021-12-03 — End: 2021-12-10

## 2021-12-03 MED ORDER — RIVAROXABAN 10 MG PO TABS: 20.0000 mg | ORAL_TABLET | Freq: Every day | ORAL | Status: DC

## 2021-12-03 MED ORDER — RIVAROXABAN 10 MG PO TABS
10.0000 mg | ORAL_TABLET | Freq: Every day | ORAL | Status: AC
  Administered 2021-12-03: 10 mg via ORAL
  Filled 2021-12-03: qty 1

## 2021-12-03 NOTE — Progress Notes (Signed)
Physical Therapy Treatment ?Patient Details ?Name: Tina Dominguez ?MRN: [id number] ?DOB: April 07, 1941 ?Today's Date: 12/03/2021 ? ? ?History of Present Illness Pt is a 81 y.o. female s/p R TKA revision. PMH significant for depression, CAD, CABG, DVT, PE, HLD, HTN, hypothyroidism, MI, OA, osteoporosis, stroke, GERD, CKD, anemia, anxiety, back surgery (2012), R shoulder surgery, and B TKA. ? ?  ?PT Comments  ? ? Pt is POD #2 s/p R TKA revision resulting in the deficits listed below (see PT Problem List). Pt performed sit to stand transfers and ambulation with for safety, demonstrating good carryover from previous session for safe hand placement with transfers. Pt ambulated total of ~17f during session with x3 brief standing rest breaks and supervision for safety. Cues provided for step to gait patten and increased step length and encouragement for WB through R LE to achieve even steps, pt demonstrated good technique following cues. PT reviewed LE HEP for promotion of DVT prevention and improved strength/ROM, pt demonstrated understanding. Pt will have assist from her son upon d/c. Pt is currently at safe mobility level for d/c to home with family assist and will benefit from skilled PT to maximize safety functional mobility and increase independence.  ?   ?Recommendations for follow up therapy are one component of a multi-disciplinary discharge planning process, led by the attending physician.  Recommendations may be updated based on patient status, additional functional criteria and insurance authorization. ? ?Follow Up Recommendations ? Follow physician's recommendations for discharge plan and follow up therapies ?  ?  ?Assistance Recommended at Discharge Intermittent Supervision/Assistance  ?Patient can return home with the following   ?  ?Equipment Recommendations ? Rolling walker (2 wheels)  ?  ?Recommendations for Other Services   ? ? ?  ?Precautions / Restrictions Precautions ?Precautions: Fall ?Precaution  Comments: wound vac R LE ?Restrictions ?Weight Bearing Restrictions: No ?Other Position/Activity Restrictions: WBAT  ?  ? ?Mobility ? Bed Mobility ?Overal bed mobility: Needs Assistance ?Bed Mobility: Supine to Sit ?  ?  ?Supine to sit: Supervision, HOB elevated ?  ?  ?  ?  ? ?Transfers ?Overall transfer level: Needs assistance ?Equipment used: Rolling walker (2 wheels) ?Transfers: Sit to/from Stand ?Sit to Stand: Supervision ?  ?  ?  ?  ?  ?General transfer comment: [facility]OB, [facility]; good carryover demonstrating safe hand placement with transfers. Able to tolerate no increased support under R LE when toileting today. ?  ? ?Ambulation/Gait ?Ambulation/Gait assistance: Supervision ?Gait Distance (Feet): 10 Feet ?Assistive device: Rolling walker (2 wheels) ?Gait Pattern/deviations: Step-to pattern, Decreased stride length, Step-through pattern, Decreased stance time - right, Antalgic, Wide base of support ?Gait velocity: decr ?  ?  ?General Gait Details: 159fto bathroom, additional 804fnto hallway following toileting, x3 brief standing rests due to fatigue. no overt LOB observed, cues for step to gait pattern. Improve step length and increased stance time on R LE observed today compared to yesterday's session. Cues for close proximity to RW. ? ? ?Stairs ?  ?  ?  ?  ?  ? ? ?Wheelchair Mobility ?  ? ?Modified Rankin (Stroke Patients Only) ?  ? ? ?  ?Balance Overall balance assessment: Needs assistance ?Sitting-balance support: Feet supported ?Sitting balance-Leahy Scale: Fair ?  ?  ?Standing balance support: Bilateral upper extremity supported, During functional activity ?Standing balance-Leahy Scale: Poor ?  ?  ?  ?  ?  ?  ?  ?  ?  ?  ?  ?  ?  ? ?  ?  Cognition Arousal/Alertness: Awake/alert ?Behavior During Therapy: [facility] for tasks assessed/performed ?Overall Cognitive Status: Within Functional Limits for tasks assessed ?  ?  ?  ?  ?  ?  ?  ?  ?  ?  ?  ?  ?  ?  ?  ?  ?  ?  ?  ? ?  ?Exercises Total  Joint Exercises ?Ankle Circles/Pumps: AROM, Both, 20 reps, Seated ?Quad Sets: AROM, Right, 10 reps, Seated ?Short Arc Quad: AROM, Right, 10 reps, Seated ?Heel Slides: AAROM, Right, 10 reps, Seated ?Hip ABduction/ADduction: AROM, Right, 10 reps, Seated ?Straight Leg Raises: AAROM, Right, 10 reps, Seated ?Long Arc Quad: AROM, Right, 10 reps, Seated (limited range due to difficulty with knee flexion due to pain and heaviness of R LE) ? ?  ?General Comments   ?  ?  ? ?Pertinent Vitals/Pain Pain Assessment ?Pain Assessment: 0-10 ?Pain Score: 5  ?Pain Location: Rt knee (most pain posteriorly) ?Pain Descriptors / Indicators: Sore, Throbbing ?Pain Intervention(s): Limited activity within patient's tolerance, Monitored during session, Repositioned, Ice applied (NT to apply ice at end of session)  ? ? ?Home Living   ?  ?  ?  ?  ?  ?  ?  ?  ?  ?   ?  ?Prior Function    ?  ?  ?   ? ?PT Goals (current goals can now be found in the care plan section) Acute Rehab PT Goals ?Patient Stated Goal: decrease pain and be able to go home ?PT Goal Formulation: With patient/family ?Time For Goal Achievement: 12/16/21 ?Potential to Achieve Goals: Good ?Progress towards PT goals: Progressing toward goals ? ?  ?Frequency ? ? ? 7X/week ? ? ? ?  ?PT Plan Current plan remains appropriate  ? ? ?Co-evaluation   ?  ?  ?  ?  ? ?  ?AM-PAC PT "6 Clicks" Mobility   ?Outcome Measure ? Help needed turning from your back to your side while in a flat bed without using bedrails?: A Little ?Help needed moving from lying on your back to sitting on the side of a flat bed without using bedrails?: A Little ?Help needed moving to and from a bed to a chair (including a wheelchair)?: A Little ?Help needed standing up from a chair using your arms (e.g., wheelchair or bedside chair)?: A Little ?Help needed to walk in hospital room?: A Little ?Help needed climbing 3-5 steps with a railing? : A Lot ?6 Click Score: 17 ? ?  ?End of Session Equipment Utilized During  Treatment: Gait belt ?Activity Tolerance: Patient tolerated treatment well ?Patient left: in chair;with call bell/phone within reach;with family/visitor present ?Nurse Communication: Mobility status ?PT Visit Diagnosis: Unsteadiness on feet (R26.81);Muscle weakness (generalized) (M62.81);Pain;Other abnormalities of gait and mobility (R26.89);Difficulty in walking, not elsewhere classified (R26.2) ?Pain - Right/Left: Right ?Pain - part of body: Knee ?  ? ? ?Time: 9166-0600 ?PT Time Calculation (min) (ACUTE ONLY): 40 min ? ?Charges:  $Therapeutic Exercise: 8-22 mins ?$Therapeutic Activity: 23-37 mins          ?          ? ?Festus Barren., PT, DPT  ?Acute Rehabilitation Services  ?Office [phone] ? ?12/03/2021, 1:49 PM ? ?

## 2021-12-03 NOTE — Progress Notes (Signed)
? ? ? ?  Subjective: ? ?Patient reports pain as moderately controlled. She worked well with PT yesterday ambulating 24 feet. She is eager to try to go home today. States she has her son at home to help her. She denies distal numbness and tingling.  ? ?Objective:  ? ?VITALS:   ?Vitals:  ? 12/02/21 0939 12/02/21 1345 12/02/21 2105 12/03/21 0521  ?BP: 126/61 (!) 133/54 (!) 116/51 (!) 165/89  ?Pulse: 65 67 62 71  ?Resp: '20  18 18  '$ ?Temp: 98.6 ?F (37 ?C) 98.8 ?F (37.1 ?C) 98.1 ?F (36.7 ?C) 98.3 ?F (36.8 ?C)  ?TempSrc: Oral Oral Oral Oral  ?SpO2: 96% 97% 93% 95%  ?Weight:      ?Height:      ? ? ?Sensation intact distally ?Intact pulses distally ?Dorsiflexion/Plantar flexion intact ?Incision: dressing C/D/I, incisional wound VAC holding suction, no output. ?Compartment soft ?` ? ?Lab Results  ?Component Value Date  ? WBC 11.5 (H) 12/03/2021  ? HGB 10.5 (L) 12/03/2021  ? HCT 33.2 (L) 12/03/2021  ? MCV 90.7 12/03/2021  ? PLT 292 12/03/2021  ? ?BMET ?   ?Component Value Date/Time  ? NA 136 12/02/2021 0311  ? NA 143 12/13/2019 1346  ? K 4.7 12/02/2021 0311  ? CL 107 12/02/2021 0311  ? CO2 25 12/02/2021 0311  ? GLUCOSE 166 (H) 12/02/2021 0311  ? BUN 17 12/02/2021 0311  ? BUN 24 12/13/2019 1346  ? CREATININE 0.87 12/02/2021 0311  ? CALCIUM 8.9 12/02/2021 0311  ? GFRNONAA >60 12/02/2021 0311  ? ?Xray: Postop x-rays demonstrate revision knee arthroplasty components in good position without evidence of fracture. ? ?Assessment/Plan: ?2 Days Post-Op  ? ?Principal Problem: ?  Failed total knee arthroplasty ([place]) ? 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 ?Post op recs: ?WB: WBAT ?Abx: ancef while in house, then dc on cefadroxil 500 BID x7 days for extended abx prophylaxis ?Imaging: PACU xrays ?DVT prophylaxis: Xarelto 10 mg postop day 1-2, resume full dose Xarelto 20 mg postop day 3 ?Follow up: 2 weeks after surgery for a wound check with Dr. Zachery Dakins at [facility].  ?Address: [address] [place],  [place], [place] [address]  ?Office Phone: [phone] ? ? ? ?Peggy Monk A Riker Collier ?12/03/2021, 6:47 AM ? ? ?Charlies Constable, MD ? ?Contact information:   ?Weekdays 7am-5pm epic message Dr. Zachery Dakins, or call office for patient follow up: [phone] ?After hours and holidays please check [email or web address] for group call information for [facility] ? ?  ?

## 2021-12-03 NOTE — Plan of Care (Signed)
Discharge instructions given to the patient including wound vac and medications. ?

## 2021-12-03 NOTE — Discharge Summary (Signed)
Physician Discharge Summary  Patient ID: Tina Dominguez MRN: [id number] DOB/AGE: [birth year] 81 y.o.  Admit date: 12/01/2021 Discharge date: 12/03/2021  Admission Diagnoses:  Failed total knee arthroplasty [facility])  Discharge Diagnoses:  Principal Problem:   Failed total knee arthroplasty [facility])   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Tina Dominguez) 12/18/2017   Carotid artery occlusion    Carotid stenosis, bilateral 05/26/2011   CKD (chronic kidney disease) stage 3, GFR 30-59 ml/min (HCC) 12/13/2017   Clotting disorder ([place])    Coronary artery disease    Depression    Dysphagia 04/17/2017   Factor V Leiden mutation (Tina Dominguez) 12/18/2017   GERD (gastroesophageal reflux disease) 05/26/2011   GI bleeding 10/04/2017   Gout 12/13/2017   Hiatal hernia 12/13/2017   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    History of kidney stones    History of recurrent TIAs 12/13/2017   History of right-sided carotid endarterectomy 10/10/2011   Hyperlipidemia    Hypertension    Hypertensive heart disease with heart failure ([place]) 01/29/2019   Hypothyroidism    Leg pain    with walking   Myocardial infarction [facility])    Occlusion and stenosis of carotid artery without mention of cerebral infarction 10/08/2012   Osteoarthritis 05/26/2011   Osteoporosis    Pancreatitis    PAT (paroxysmal atrial tachycardia) (Tina Dominguez) 12/05/2018   PE (pulmonary embolism) 05/26/2011   Pneumonia    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Tina Dominguez) 04/17/2017   Reflux    Renal failure 12/13/2017   S/P IVC filter 12/13/2017   Schatzki's ring 12/13/2017   Stroke (Tina Dominguez)    X's 4   Valvular heart disease 12/13/2017    Surgeries:  Procedure(s): Revision total knee arthroplasty right on 12/01/2021   Consultants (if any):   Discharged Condition: Improved  Hospital Course: Tina Dominguez is an 81 y.o. female who was admitted 12/01/2021 with a diagnosis of Failed total knee arthroplasty (Tina Dominguez) and went to the operating room on 12/01/2021 and underwent the above named procedures.    She was given perioperative antibiotics:  Anti-infectives (From admission, onward)    Start     Dose/Rate Route Frequency Ordered Stop   12/03/21 0000  cefadroxil (DURICEF) 500 MG capsule        500 mg Oral 2 times daily 12/03/21 0651 12/10/21 2359   12/01/21 2000  ceFAZolin (ANCEF) IVPB 2g/100 mL premix        2 g 200 mL/hr over 30 Minutes Intravenous Every 8 hours 12/01/21 1822 12/04/21 1959   12/01/21 0850  ceFAZolin (ANCEF) 2-4 GM/100ML-% IVPB       Note to Pharmacy: Kyra Leyland E: cabinet override      12/01/21 0850 12/01/21 1213   12/01/21 0845  ceFAZolin (ANCEF) IVPB 2g/100 mL premix        2 g 200 mL/hr over 30 Minutes Intravenous On call to O.R. 12/01/21 1517 12/01/21 1206     .  She was given sequential compression devices, early ambulation, and xarelto for DVT prophylaxis.  She benefited maximally from the hospital stay and there were no complications.    Recent vital signs:  Vitals:   12/02/21 2105 12/03/21 0521  BP: Tina Dominguez)  116/51 (!) 165/89  Pulse: 62 71  Resp: 18 18  Temp: 98.1 F (36.7 C) 98.3 F (36.8 C)  SpO2: 93% 95%    Recent laboratory studies:  Lab Results  Component Value Date   HGB 10.5 (L) 12/03/2021   HGB 9.8 (L) 12/02/2021   HGB 11.3 (L) 11/18/2021   Lab Results  Component Value Date   WBC 11.5 (H) 12/03/2021   PLT 292 12/03/2021   Lab Results  Component Value Date   INR 1.0 10/22/2021   Lab Results  Component Value Date   NA 138 12/03/2021   K 3.7 12/03/2021   CL 102 12/03/2021   CO2 28 12/03/2021   BUN 21 12/03/2021   CREATININE 0.95 12/03/2021   GLUCOSE 102 (H) 12/03/2021     Discharge Medications:   Allergies as of 12/03/2021   No Known Allergies      Medication List     TAKE these medications    acetaminophen 500 MG tablet Commonly known as: TYLENOL Take 500-1,000 mg by mouth every 6 (six) hours as needed for moderate pain.   amiodarone 200 MG tablet Commonly known as: PACERONE TAKE 1 TABLET(200 MG) BY MOUTH DAILY   amLODipine 5 MG tablet Commonly known as: NORVASC Take 1 tablet (5 mg total) by mouth daily.   atorvastatin 40 MG tablet Commonly known as: LIPITOR TAKE 1 TABLET BY MOUTH EVERY DAY AT 6 PM   cefadroxil 500 MG capsule Commonly known as: DURICEF Take 1 capsule (500 mg total) by mouth 2 (two) times daily for 7 days.   citalopram 10 MG tablet Commonly known as: CELEXA Take 10 mg by mouth at bedtime. Take with 40 mg to equal 50 mg at bedtime   citalopram 40 MG tablet Commonly known as: CELEXA Take 40 mg by mouth daily. Take with 10 mg to equal 50 mg at bedtime   cloNIDine 0.1 MG tablet Commonly known as: CATAPRES Take 1 tablet (0.1 mg total) by mouth at bedtime. Take 1 extra tablet daily if your systolic BP (top number) is greater than 180 and if your heart rate is greater than 100.   famotidine 20 MG tablet Commonly known as: PEPCID Take 20 mg by mouth daily as needed for heartburn or indigestion.   furosemide 20 MG tablet Commonly known as: LASIX Take 20 mg by mouth daily.   levothyroxine 100 MCG tablet Commonly known as: SYNTHROID Take 100 mcg by mouth daily.   linaclotide 290 MCG Caps capsule Commonly known as: LINZESS Take 290 mcg by mouth every other day.   methocarbamol 500 MG tablet Commonly known as: Robaxin Take 1 tablet (500 mg total) by mouth every 8 (eight) hours as needed for up to 10 days for muscle spasms.   metoprolol tartrate 25 MG tablet Commonly known as: LOPRESSOR Metoprolol tartrate 12.5 mg (half tablet) in the morning daily. Hold for heart rate <100.   mirtazapine 30 MG  tablet Commonly known as: REMERON Take 30 mg by mouth at bedtime.   multivitamin tablet Take 1 tablet by mouth daily.   oxyCODONE 5 MG immediate release tablet Commonly known as: Roxicodone Take 2-3 tablets (10-15 mg total) by mouth every 6 (six) hours as needed for up to 7 days for severe pain or moderate pain. What changed:  medication strength how much to take reasons to take this   potassium chloride SA 20 MEQ tablet Commonly known as: KLOR-CON M Take 1 Tab (4mq) every other day with lasix What changed:  how much to take how to take this when to take this additional instructions   Xarelto 20 MG Tabs tablet Generic drug: rivaroxaban Take 20 mg by mouth daily.        Diagnostic Studies: DG Knee Right Port  Result Date: 12/01/2021 CLINICAL DATA:  Right knee arthroplasty EXAM: PORTABLE RIGHT KNEE - 1-2 VIEW COMPARISON:  Right knee radiograph 06/03/2004 FINDINGS: Postsurgical changes of three component right knee arthroplasty revision with long stem femoral and tibial components. Expected soft tissue changes. Normal alignment. No evidence of fracture. IMPRESSION: Right knee arthroplasty revision with long stem tibial and femoral components. No evidence of immediate complication. Electronically Signed   By: Maurine Simmering M.D.   On: 12/01/2021 16:47    Disposition: Discharge disposition: 01-Home or Self Care       Discharge Instructions     Call MD / Call 911   Complete by: As directed    If you experience chest pain or shortness of breath, CALL 911 and be transported to the hospital emergency room.  If you develope a fever above 101 F, pus (white drainage) or increased drainage or redness at the wound, or calf pain, call your surgeon's office.   Constipation Prevention   Complete by: As directed    Drink plenty of fluids.  Prune juice may be helpful.  You may use a stool softener, such as Colace (over the counter) 100 mg twice a day.  Use MiraLax (over the counter) for  constipation as needed.   Diet - low sodium heart healthy   Complete by: As directed    Increase activity slowly as tolerated   Complete by: As directed    Post-operative opioid taper instructions:   Complete by: As directed    POST-OPERATIVE OPIOID TAPER INSTRUCTIONS: It is important to wean off of your opioid medication as soon as possible. If you do not need pain medication after your surgery it is ok to stop day one. Opioids include: Codeine, Hydrocodone(Norco, Vicodin), Oxycodone(Percocet, oxycontin) and hydromorphone amongst others.  Long term and even short term use of opiods can cause: Increased pain response Dependence Constipation Depression Respiratory depression And more.  Withdrawal symptoms can include Flu like symptoms Nausea, vomiting And more Techniques to manage these symptoms Hydrate well Eat regular healthy meals Stay active Use relaxation techniques(deep breathing, meditating, yoga) Do Not substitute Alcohol to help with tapering If you have been on opioids for less than two weeks and do not have pain than it is ok to stop all together.  Plan to wean off of opioids This plan should start within one week post op of your joint replacement. Maintain the same interval or time between taking each dose and first decrease the dose.  Cut the total daily intake of opioids by one tablet each day Next start to increase the time between doses. The last dose that should be eliminated is the evening dose.           Follow-up Information     [facility] Follow [facility].   Specialty: [place] Why: to provide home physical therapy Contact information: [place] [address] [phone]         Willaim Sheng, MD Follow up in 1 week(s).   Specialty: Orthopedic Surgery Contact information: [place] [address] [phone]                    Discharge Instructions  INSTRUCTIONS AFTER JOINT REPLACEMENT   Remove items at home which could result in a fall. This includes throw rugs or furniture in walking pathways ICE to the affected joint every three hours while awake for 30 minutes at a time, for at least the first 3-5 days, and then as needed for pain and swelling.  Continue to use ice for pain and swelling. You may notice swelling that will progress down to the foot and ankle.  This is normal after surgery.  Elevate your leg when you are not up walking on it.   Continue to use the breathing machine you got in the hospital (incentive spirometer) which will help keep your temperature down.  It is common for your temperature to cycle up and down following surgery, especially at night when you are not up moving around and exerting yourself.  The breathing machine keeps your lungs expanded and your temperature down.   DIET:  As you were doing prior to hospitalization, we recommend a well-balanced diet.  DRESSING / WOUND CARE / SHOWERING  Keep the surgical dressing until follow up.  The dressing is water proof, so you can shower without any extra covering.  IF THE DRESSING FALLS OFF or the wound gets wet inside, change the dressing with sterile gauze.  Please use good hand washing techniques before changing the dressing.  Do not use any lotions or creams on the incision until instructed by your surgeon.    ACTIVITY  Increase activity slowly as tolerated, but follow the weight bearing instructions below.   No driving for 6 weeks or until further direction given by your physician.  You cannot drive while taking narcotics.  No lifting or carrying greater than 10 lbs. until further directed by your surgeon. Avoid periods of inactivity such as sitting longer than an hour when not asleep. This helps prevent blood clots.  You may return to work once you are authorized by your doctor.     WEIGHT BEARING   Weight bearing as tolerated with assist device (walker,  cane, etc) as directed, use it as long as suggested by your surgeon or therapist, typically at least 4-6 weeks.   EXERCISES  Results after joint replacement surgery are often greatly improved when you follow the exercise, range of motion and muscle strengthening exercises prescribed by your doctor. Safety measures are also important to protect the joint from further injury. Any time any of these exercises cause you to have increased pain or swelling, decrease what you are doing until you are comfortable again and then slowly increase them. If you have problems or questions, call your caregiver or physical therapist for advice.   Rehabilitation is important following a joint replacement. After just a few days of immobilization, the muscles of the leg can become weakened and shrink (atrophy).  These exercises are designed to build up the tone and strength of the thigh and leg muscles and to improve motion. Often times heat used for twenty to thirty minutes before working out will loosen up your tissues and help with improving the range of motion but do not use heat for the first two weeks following surgery (sometimes heat can increase post-operative swelling).   These exercises can be done on a training (exercise) mat, on the floor, on a table or on a bed. Use whatever works the best and is most comfortable for you.    Use music or television while you are exercising so that the exercises are a pleasant break in your  day. This will make your life better with the exercises acting as a break in your routine that you can look forward to.   Perform all exercises about fifteen times, three times per day or as directed.  You should exercise both the operative leg and the other leg as well.  Exercises include:   Quad Sets - Tighten up the muscle on the front of the thigh (Quad) and hold for 5-10 seconds.   Straight Leg Raises - With your knee straight (if you were given a brace, keep it on), lift the leg to 60  degrees, hold for 3 seconds, and slowly lower the leg.  Perform this exercise against resistance later as your leg gets stronger.  Leg Slides: Lying on your back, slowly slide your foot toward your buttocks, bending your knee up off the floor (only go as far as is comfortable). Then slowly slide your foot back down until your leg is flat on the floor again.  Angel Wings: Lying on your back spread your legs to the side as far apart as you can without causing discomfort.  Hamstring Strength:  Lying on your back, push your heel against the floor with your leg straight by tightening up the muscles of your buttocks.  Repeat, but this time bend your knee to a comfortable angle, and push your heel against the floor.  You may put a pillow under the heel to make it more comfortable if necessary.   A rehabilitation program following joint replacement surgery can speed recovery and prevent re-injury in the future due to weakened muscles. Contact your doctor or a physical therapist for more information on knee rehabilitation.    CONSTIPATION  Constipation is defined medically as fewer than three stools per week and severe constipation as less than one stool per week.  Even if you have a regular bowel pattern at home, your normal regimen is likely to be disrupted due to multiple reasons following surgery.  Combination of anesthesia, postoperative narcotics, change in appetite and fluid intake all can affect your bowels.   YOU MUST use at least one of the following options; they are listed in order of increasing strength to get the job done.  They are all available over the counter, and you may need to use some, POSSIBLY even all of these options:    Drink plenty of fluids (prune juice may be helpful) and high fiber foods Colace 100 mg by mouth twice a day  Senokot for constipation as directed and as needed Dulcolax (bisacodyl), take with full glass of water  Miralax (polyethylene glycol) once or twice a day as  needed.  If you have tried all these things and are unable to have a bowel movement in the first 3-4 days after surgery call either your surgeon or your primary doctor.    If you experience loose stools or diarrhea, hold the medications until you stool forms back up.  If your symptoms do not get better within 1 week or if they get worse, check with your doctor.  If you experience "the worst abdominal pain ever" or develop nausea or vomiting, please contact the office immediately for further recommendations for treatment.   ITCHING:  If you experience itching with your medications, try taking only a single pain pill, or even half a pain pill at a time.  You can also use Benadryl over the counter for itching or also to help with sleep.   TED HOSE STOCKINGS:  Use stockings on both  legs until for at least 2 weeks or as directed by physician office. They may be removed at night for sleeping.  MEDICATIONS:  See your medication summary on the After Visit Summary that nursing will review with you.  You may have some home medications which will be placed on hold until you complete the course of blood thinner medication.  It is important for you to complete the blood thinner medication as prescribed.   Blood clot prevention (DVT Prophylaxis): After surgery you are at an increased risk for a blood clot. We have resumed your Xarelto to help reduce your risk of getting a blood clot. This will help prevent a blood clot. Signs of a pulmonary embolus (blood clot in the lungs) include sudden short of breath, feeling lightheaded or dizzy, chest pain with a deep breath, rapid pulse rapid breathing. Signs of a blood clot in your arms or legs include new unexplained swelling and cramping, warm, red or darkened skin around the painful area. Please call the office or 911 right away if these signs or symptoms develop.  PRECAUTIONS:  If you experience chest pain or shortness of breath - call 911 immediately for transfer to  the hospital emergency department.   If you develop a fever greater that 101 F, purulent drainage from wound, increased redness or drainage from wound, foul odor from the wound/dressing, or calf pain - CONTACT YOUR SURGEON.                                                   FOLLOW-UP APPOINTMENTS:  If you do not already have a post-op appointment, please call the office for an appointment to be seen by your surgeon.  Guidelines for how soon to be seen are listed in your After Visit Summary, but are typically between 2-3 weeks after surgery.  OTHER INSTRUCTIONS:   Knee Replacement:  Do not place pillow under knee, focus on keeping the knee straight while resting.    POST-OPERATIVE OPIOID TAPER INSTRUCTIONS: It is important to wean off of your opioid medication as soon as possible. If you do not need pain medication after your surgery it is ok to stop day one. Opioids include: Codeine, Hydrocodone(Norco, Vicodin), Oxycodone(Percocet, oxycontin) and hydromorphone amongst others.  Long term and even short term use of opiods can cause: Increased pain response Dependence Constipation Depression Respiratory depression And more.  Withdrawal symptoms can include Flu like symptoms Nausea, vomiting And more Techniques to manage these symptoms Hydrate well Eat regular healthy meals Stay active Use relaxation techniques(deep breathing, meditating, yoga) Do Not substitute Alcohol to help with tapering If you have been on opioids for less than two weeks and do not have pain than it is ok to stop all together.  Plan to wean off of opioids This plan should start within one week post op of your joint replacement. Maintain the same interval or time between taking each dose and first decrease the dose.  Cut the total daily intake of opioids by one tablet each day Next start to increase the time between doses. The last dose that should be eliminated is the evening dose.   MAKE SURE YOU:   Understand these instructions.  Get help right away if you are not doing well or get worse.    Thank you for letting us be a part of your medical  care team.  It is a privilege we respect greatly.  We hope these instructions will help you stay on track for a fast and full recovery!            Signed: Nazareth Kirk A Briza Bark 12/03/2021, 1:18 PM

## 2021-12-03 NOTE — Plan of Care (Signed)
  Problem: Health Behavior/Discharge Planning: Goal: Ability to manage health-related needs will improve Outcome: Progressing   Problem: Activity: Goal: Risk for activity intolerance will decrease Outcome: Progressing   Problem: Pain Managment: Goal: General experience of comfort will improve Outcome: Progressing   

## 2021-12-05 LAB — TYPE AND SCREEN
ABO/RH(D): O POS
Antibody Screen: NEGATIVE
Donor AG Type: NEGATIVE
Donor AG Type: NEGATIVE
Unit division: 0
Unit division: 0

## 2021-12-05 LAB — BPAM RBC
Blood Product Expiration Date: 202304012359
Blood Product Expiration Date: 202304022359
Unit Type and Rh: 5100
Unit Type and Rh: 5100

## 2021-12-06 ENCOUNTER — Encounter (HOSPITAL_COMMUNITY): Payer: Self-pay | Admitting: Orthopedic Surgery

## 2021-12-10 ENCOUNTER — Ambulatory Visit: Payer: PPO | Admitting: Cardiology

## 2022-02-02 ENCOUNTER — Other Ambulatory Visit: Payer: Self-pay

## 2022-02-02 ENCOUNTER — Encounter (HOSPITAL_COMMUNITY): Payer: Self-pay | Admitting: Emergency Medicine

## 2022-02-02 ENCOUNTER — Emergency Department (HOSPITAL_COMMUNITY)
Admission: EM | Admit: 2022-02-02 | Discharge: 2022-02-03 | Disposition: A | Discharge status: Home / Self Care | Payer: PPO | Attending: Emergency Medicine | Admitting: Emergency Medicine

## 2022-02-02 DIAGNOSIS — Z7901 Long term (current) use of anticoagulants: Secondary | ICD-10-CM | POA: Insufficient documentation

## 2022-02-02 DIAGNOSIS — I509 Heart failure, unspecified: Secondary | ICD-10-CM | POA: Diagnosis not present

## 2022-02-02 DIAGNOSIS — Z79899 Other long term (current) drug therapy: Secondary | ICD-10-CM | POA: Insufficient documentation

## 2022-02-02 DIAGNOSIS — I2510 Atherosclerotic heart disease of native coronary artery without angina pectoris: Secondary | ICD-10-CM | POA: Diagnosis not present

## 2022-02-02 DIAGNOSIS — K8020 Calculus of gallbladder without cholecystitis without obstruction: Secondary | ICD-10-CM

## 2022-02-02 DIAGNOSIS — R197 Diarrhea, unspecified: Secondary | ICD-10-CM | POA: Diagnosis not present

## 2022-02-02 DIAGNOSIS — R110 Nausea: Secondary | ICD-10-CM | POA: Insufficient documentation

## 2022-02-02 DIAGNOSIS — R1011 Right upper quadrant pain: Secondary | ICD-10-CM | POA: Diagnosis present

## 2022-02-02 DIAGNOSIS — R1012 Left upper quadrant pain: Secondary | ICD-10-CM | POA: Diagnosis not present

## 2022-02-02 LAB — CBC WITH DIFFERENTIAL/PLATELET
Abs Immature Granulocytes: 0.05 10*3/uL (ref 0.00–0.07)
Basophils Absolute: 0 10*3/uL (ref 0.0–0.1)
Basophils Relative: 0 %
Eosinophils Absolute: 0.1 10*3/uL (ref 0.0–0.5)
Eosinophils Relative: 1 %
HCT: 37.7 % (ref 36.0–46.0)
Hemoglobin: 11.6 g/dL — ABNORMAL LOW (ref 12.0–15.0)
Immature Granulocytes: 1 %
Lymphocytes Relative: 12 %
Lymphs Abs: 1.3 10*3/uL (ref 0.7–4.0)
MCH: 27.2 pg (ref 26.0–34.0)
MCHC: 30.8 g/dL (ref 30.0–36.0)
MCV: 88.5 fL (ref 80.0–100.0)
Monocytes Absolute: 0.6 10*3/uL (ref 0.1–1.0)
Monocytes Relative: 6 %
Neutro Abs: 8.6 10*3/uL — ABNORMAL HIGH (ref 1.7–7.7)
Neutrophils Relative %: 80 %
Platelets: 408 10*3/uL — ABNORMAL HIGH (ref 150–400)
RBC: 4.26 MIL/uL (ref 3.87–5.11)
RDW: 15.1 % (ref 11.5–15.5)
WBC: 10.7 10*3/uL — ABNORMAL HIGH (ref 4.0–10.5)
nRBC: 0 % (ref 0.0–0.2)

## 2022-02-02 LAB — URINALYSIS, ROUTINE W REFLEX MICROSCOPIC
Bilirubin Urine: NEGATIVE
Glucose, UA: NEGATIVE mg/dL
Hgb urine dipstick: NEGATIVE
Ketones, ur: NEGATIVE mg/dL
Nitrite: NEGATIVE
Protein, ur: NEGATIVE mg/dL
Specific Gravity, Urine: 1.015 (ref 1.005–1.030)
pH: 7 (ref 5.0–8.0)

## 2022-02-02 LAB — COMPREHENSIVE METABOLIC PANEL
ALT: 13 U/L (ref 0–44)
AST: 25 U/L (ref 15–41)
Albumin: 3.6 g/dL (ref 3.5–5.0)
Alkaline Phosphatase: 114 U/L (ref 38–126)
Anion gap: 8 (ref 5–15)
BUN: 17 mg/dL (ref 8–23)
CO2: 29 mmol/L (ref 22–32)
Calcium: 9.3 mg/dL (ref 8.9–10.3)
Chloride: 101 mmol/L (ref 98–111)
Creatinine, Ser: 1.32 mg/dL — ABNORMAL HIGH (ref 0.44–1.00)
GFR, Estimated: 41 mL/min — ABNORMAL LOW (ref >60)
Glucose, Bld: 153 mg/dL — ABNORMAL HIGH (ref 70–99)
Potassium: 4.8 mmol/L (ref 3.5–5.1)
Sodium: 138 mmol/L (ref 135–145)
Total Bilirubin: 0.7 mg/dL (ref 0.3–1.2)
Total Protein: 7.6 g/dL (ref 6.5–8.1)

## 2022-02-02 LAB — LIPASE, BLOOD: Lipase: 22 U/L (ref 11–51)

## 2022-02-02 LAB — TROPONIN I (HIGH SENSITIVITY): Troponin I (High Sensitivity): 9 ng/L (ref <18)

## 2022-02-02 NOTE — ED Provider Triage Note (Signed)
Emergency Medicine Provider Triage Evaluation Note ? ?Tina Dominguez , a 81 y.o. female  was evaluated in triage.  Pt complains of  upper abdominal pain and nausea since yesterday. She reports that this feels like the pancreatitis she had in January. Supposed to be on home oxygen but doesn't wear it. Denies any melena, hematochezia, dysuria, hematuria, constipation, diarrhea, or fever. Denies any chest pain or SOB. Recent knee replacement in march.  ? ?Review of Systems  ?Positive:  ?Negative:  ? ?Physical Exam  ?There were no vitals taken for this visit. ?Gen:   Awake, no distress   ?Resp:  Normal effort  ?MSK:   Moves extremities without difficulty  ?Other:  NBS. Abdomen soft with epigastric and RUQ tenderness.  ? ?Medical Decision Making  ?Medically screening exam initiated at 10:29 PM.  Appropriate orders placed.  Tina Dominguez was informed that the remainder of the evaluation will be completed by another provider, this initial triage assessment does not replace that evaluation, and the importance of remaining in the ED until their evaluation is complete. ? ?Will order basic labs and imaging. Patient well appearing in no acute distress.  ?  ?Sherrell Puller, PA-C ?02/02/22 2234 ? ?

## 2022-02-02 NOTE — ED Triage Notes (Signed)
Pt reports abdominal pain since yesterday  Diagnosed with pancreatitis and gallstones last month per pt  VSS.  Pain 5/10.  Diffuse RL abdominal pain.  2L [place] at home but does not usually wear it.  Placed by EMS.  VSS.   ?

## 2022-02-03 ENCOUNTER — Emergency Department (HOSPITAL_COMMUNITY): Payer: PPO

## 2022-02-03 LAB — TROPONIN I (HIGH SENSITIVITY): Troponin I (High Sensitivity): 8 ng/L (ref <18)

## 2022-02-03 MED ORDER — ONDANSETRON HCL 4 MG/2ML IJ SOLN
4.0000 mg | Freq: Once | INTRAMUSCULAR | Status: AC
  Administered 2022-02-03: 4 mg via INTRAVENOUS
  Filled 2022-02-03: qty 2

## 2022-02-03 MED ORDER — FENTANYL CITRATE PF 50 MCG/ML IJ SOSY: 25.0000 ug | PREFILLED_SYRINGE | Freq: Once | INTRAMUSCULAR | Status: DC

## 2022-02-03 MED ORDER — IOHEXOL 300 MG/ML  SOLN
80.0000 mL | Freq: Once | INTRAMUSCULAR | Status: AC | PRN
  Administered 2022-02-03: 80 mL via INTRAVENOUS

## 2022-02-03 MED ORDER — FENTANYL CITRATE PF 50 MCG/ML IJ SOSY
50.0000 ug | PREFILLED_SYRINGE | Freq: Once | INTRAMUSCULAR | Status: AC
  Administered 2022-02-03: 50 ug via INTRAVENOUS
  Filled 2022-02-03: qty 1

## 2022-02-03 NOTE — ED Provider Notes (Signed)
?[facility] ?Provider Note ? ? ?CSN: [id number] ?Arrival date & time: 02/02/22  2216 ? ?  ? ?History ? ?Chief Complaint  ?Patient presents with  ? Abdominal Pain  ? ? ?Tina Dominguez is a 81 y.o. female. ? ?The history is provided by the patient, a relative and medical records.  ?Abdominal Pain ?Tina Dominguez is a 81 y.o. female who presents to the Emergency Department complaining of abdominal pain.  She presents to the ED for evaluation of abdominal pain, mostly over the RUQ.  Pain is described as aching.  Pain is constant in nature.  Has associated nausea. Has diarrhea.  ? ?No fever, vomiting. No chest pain, sob.  Has swelling to RLE - had recent knee replacement - ongoing issue since surgery.   ? 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  ?  ? ?Home Medications ?Prior to Admission medications   ?Medication Sig Start Date End Date Taking? Authorizing Provider  ?acetaminophen (TYLENOL) 500 MG tablet Take 500-1,000 mg by mouth every 6 (six) hours as needed for moderate pain.     [provider]  ?amiodarone (PACERONE) 200 MG tablet TAKE 1 TABLET(200 MG) BY MOUTH DAILY 06/16/20   Richardo Priest, MD  ?amLODipine (NORVASC) 5 MG tablet Take 1 tablet (5 mg total) by mouth daily. 08/06/19   Loel Dubonnet, NP  ?atorvastatin (LIPITOR) 40 MG tablet TAKE 1 TABLET BY MOUTH EVERY DAY AT 6 PM 12/28/20   Richardo Priest, MD  ?citalopram (CELEXA) 10 MG tablet Take 10 mg by mouth at bedtime. Take with 40 mg to equal 50 mg at bedtime    [provider]  ?citalopram (CELEXA) 40 MG tablet Take 40 mg by mouth daily. Take with 10 mg to equal 50 mg at bedtime 11/22/18   [provider]  ?cloNIDine (CATAPRES) 0.1 MG tablet Take 1 tablet (0.1 mg total) by mouth at bedtime. Take 1 extra tablet daily if your systolic BP (top number) is greater than 180 and if your heart rate is  greater than 100. 01/01/20   Munley, Hilton Cork, MD  ?famotidine (PEPCID) 20 MG tablet Take 20 mg by mouth daily as needed for heartburn or indigestion.    [provider]  ?furosemide (LASIX) 20 MG tablet Take 20 mg by mouth daily.    [provider]  ?levothyroxine (SYNTHROID) 100 MCG tablet Take 100 mcg by mouth daily. 09/06/19   [provider]  ?linaclotide (LINZESS) 290 MCG CAPS capsule Take 290 mcg by mouth every other day.    [provider]  ?metoprolol tartrate (LOPRESSOR) 25 MG tablet Metoprolol tartrate 12.5 mg (half tablet) in the morning daily. Hold for heart rate <100. 09/13/19   Richardo Priest, MD  ?mirtazapine (REMERON) 30 MG tablet Take 30 mg by mouth at bedtime.     [provider]  ?Multiple Vitamin (MULTIVITAMIN) tablet Take 1 tablet by mouth daily.    [provider]  ?potassium chloride SA (K-DUR) 20 MEQ tablet Take 1 Tab (70mq) every other day with lasix ?Patient taking differently: Take 20 mEq by mouth 2 (two) times daily. 04/09/19   MRichardo Priest MD  ?XAlveda Reasons20 MG TABS tablet Take 20 mg by mouth daily. 10/10/17   [provider]  ?   ? ?Allergies    ?Patient has no known allergies.   ? ?Review of Systems   ?  Review of Systems  ?Gastrointestinal:  Positive for abdominal pain.  ?All other systems reviewed and are negative. ? ?Physical Exam ?Updated Vital Signs ?BP (!) 175/62   Pulse 64   Temp 98.4 ?F (36.9 ?C) (Oral)   Resp (!) 21   SpO2 99%  ?Physical Exam ?Vitals and nursing note reviewed.  ?Constitutional:   ?   Appearance: She is well-developed.  ?HENT:  ?   Head: Normocephalic and atraumatic.  ?Cardiovascular:  ?   Rate and Rhythm: Normal rate and regular rhythm.  ?   Heart sounds: No murmur heard. ?Pulmonary:  ?   Effort: Pulmonary effort is normal. No respiratory distress.  ?   Breath sounds: Normal breath sounds.  ?Abdominal:  ?   Palpations: Abdomen is soft.  ?   Tenderness: There is no guarding or rebound.  ?    Comments: Moderate RUQ tenderness, vol guarding.  Mild LUQ tenderness  ?Musculoskeletal:     ?   General: No tenderness.  ?   Comments: Trace pitting edema to BLE  ?Skin: ?   General: Skin is warm and dry.  ?Neurological:  ?   Mental Status: She is alert and oriented to person, place, and time.  ?Psychiatric:     ?   Behavior: Behavior normal.  ? ? ?ED Results / Procedures / Treatments   ?Labs ?(all labs ordered are listed, but only abnormal results are displayed) ?Labs Reviewed  ?URINALYSIS, ROUTINE W REFLEX MICROSCOPIC - Abnormal; Notable for the following components:  ?    Result Value  ? APPearance HAZY (*)   ? Leukocytes,Ua MODERATE (*)   ? Bacteria, UA RARE (*)   ? All other components within normal limits  ?CBC WITH DIFFERENTIAL/PLATELET - Abnormal; Notable for the following components:  ? WBC 10.7 (*)   ? Hemoglobin 11.6 (*)   ? Platelets 408 (*)   ? Neutro Abs 8.6 (*)   ? All other components within normal limits  ?COMPREHENSIVE METABOLIC PANEL - Abnormal; Notable for the following components:  ? Glucose, Bld 153 (*)   ? Creatinine, Ser 1.32 (*)   ? GFR, Estimated 41 (*)   ? All other components within normal limits  ?LIPASE, BLOOD  ?TROPONIN I (HIGH SENSITIVITY)  ?TROPONIN I (HIGH SENSITIVITY)  ? ? ?EKG ?None ? ?Radiology ?CT ABDOMEN PELVIS W CONTRAST ? ?Result Date: 02/03/2022 ?CLINICAL DATA:  Abdominal pain EXAM: CT ABDOMEN AND PELVIS WITH CONTRAST TECHNIQUE: Multidetector CT imaging of the abdomen and pelvis was performed using the standard protocol following bolus administration of intravenous contrast. RADIATION DOSE REDUCTION: This exam was performed according to the departmental dose-optimization program which includes automated exposure control, adjustment of the mA and/or kV according to patient size and/or use of iterative reconstruction technique. CONTRAST:  80 mL Omnipaque 300 IV COMPARISON:  10/14/2021 FINDINGS: Lower chest: Small to moderate-sized hiatal hernia. No acute abnormality.  Hepatobiliary: Layering gallstones within the gallbladder, unchanged since prior study. No focal hepatic abnormality or biliary ductal dilatation Pancreas: No focal abnormality or ductal dilatation. Spleen: No focal abnormality.  Normal size. Adrenals/Urinary Tract: Stable small cyst in the midpole of the left kidney. No adrenal abnormality. No focal renal abnormality. No stones or hydronephrosis. Urinary bladder is unremarkable. Stomach/Bowel: Stomach, large and small bowel grossly unremarkable. Vascular/Lymphatic: Aortic atherosclerosis. No evidence of aneurysm or adenopathy. IVC filter noted in the infrarenal IVC. Reproductive: Prior hysterectomy.  No adnexal masses. Other: No free fluid or free air. Musculoskeletal: No acute bony abnormality. Postoperative changes in  the lumbar spine. IMPRESSION: No acute findings. Small to moderate hiatal hernia. Cholelithiasis. Aortic atherosclerosis. Electronically Signed   By: Rolm Baptise M.D.   On: 02/03/2022 01:23   ? ?Procedures ?Procedures  ? ? ?Medications Ordered in ED ?Medications  ?iohexol (OMNIPAQUE) 300 MG/ML solution 80 mL (80 mLs Intravenous Contrast Given 02/03/22 0157)  ? ? ?ED Course/ Medical Decision Making/ A&P ?  ?                        ?Medical Decision Making ?Amount and/or Complexity of Data Reviewed ?Radiology: ordered. ? ?Risk ?Prescription drug management. ? ? ?Pt with hx/o CHF, CKD, PE on Xarelto here for evaluation of abdominal pain.  She does have focal tenderness in the right upper quadrant.  Labs with mild leukocytosis.  CT scan demonstrates cholelithiasis, no additional significant abnormalities.  Plan to obtain right upper quadrant ultrasound to rule out cholecystitis.  Patient care transferred pending additional imaging.  Will treat pain. ? ? ? ? ? ? ? ?Final Clinical Impression(s) / ED Diagnoses ?Final diagnoses:  ?None  ? ? ?Rx / DC Orders ?ED Discharge Orders   ? ? None  ? ?  ? ? ?  ?Quintella Reichert, MD ?02/03/22 [phone] ? ?

## 2022-02-03 NOTE — Discharge Instructions (Addendum)
The ultrasound demonstrated gallstones.  I strongly recommend calling the general surgery clinic to schedule an appointment to discuss further management of your gallbladder given your symptoms from today.  If you are having increasing pain, you develop increasing vomiting, fever, or other new concerning symptom, please return immediately to ER for reassessment. ?

## 2022-02-03 NOTE — ED Provider Notes (Signed)
Signout note ? ?81 year old lady who has extensive past medical history including prior history of CAD, CKD, VTE, on anticoagulation s/p IVC filter, episode of pancreatitis in January, thought secondary from gallstones.  Surgery not recommended at the time due to her comorbidities and patient preference to avoid surgery if possible.  Today patient having right upper quadrant pain.  CT negative for acute abdominal pelvic pathology, LFTs within normal limits.  Ultrasound ordered to better evaluate for acute biliary process.  Ultrasound demonstrates cholelithiasis but no evidence for acute cholecystitis.  Reassessed patient, she states her pain is improved but still having a mild amount of ongoing pain.  Offered to potentially admit patient, consult with surgery to discuss potentially further testing, surgical consideration versus discharge and follow-up in the outpatient setting.  Patient states that she does not want to be admitted in the hospital but is willing to follow-up in the outpatient setting.  Tolerating p.o.  Well-appearing with stable vitals.  Will discharge patient, advised follow-up with general surgery clinic to discuss concern for symptomatic cholelithiasis.  Lengthy discussion regarding return precautions if she has increasing or uncontrolled pain, vomiting, fever.  Patient demonstrated good understanding and was discharged home. ?  ?Lucrezia Starch, MD ?02/03/22 [phone] ? ?

## 2022-02-25 ENCOUNTER — Ambulatory Visit: Payer: Self-pay | Admitting: Surgery

## 2022-03-17 ENCOUNTER — Other Ambulatory Visit: Payer: Self-pay | Admitting: Cardiology

## 2022-04-18 ENCOUNTER — Other Ambulatory Visit: Payer: Self-pay

## 2022-04-18 ENCOUNTER — Encounter (HOSPITAL_COMMUNITY): Payer: Self-pay | Admitting: Surgery

## 2022-04-18 NOTE — Progress Notes (Signed)
PCP - Dr Celedonio Miyamoto Cardiologist - Dr Shirlee More Nephrology - Dr Nydia Bouton Nwobu  Chest x-ray - 10/14/21 (1V) EKG - 02/02/22 Stress Test - 12/04/18 ECHO - 10/15/21 Cardiac Cath - 12/10/18  ICD Pacemaker/Loop - n/a  Sleep Study -  Yes CPAP - does not use CPAP  Blood Thinner Instructions:  Hold Xarelto 2 days prior to procedure per MD.  Last dose was on 04/16/22.  ERAS: Clear liquids til 6 AM DOS  Anesthesia review: Yes  STOP now taking any Aspirin (unless otherwise instructed by your surgeon), Aleve, Naproxen, Ibuprofen, Motrin, Advil, Goody's, BC's, all herbal medications, fish oil, and all vitamins.   Coronavirus Screening Do you have any of the following symptoms:  Cough yes/no: No Fever (>100.67F)  yes/no: No Runny nose yes/no: No Sore throat yes/no: No Difficulty breathing/shortness of breath  yes/no: No  Have you traveled in the last 14 days and where? yes/no: No  Patient verbalized understanding of instructions that were given via phone.

## 2022-04-18 NOTE — Progress Notes (Signed)
Anesthesia Chart Review: Tina Dominguez  Case: [id number] Date/Time: 04/19/22 0845   Procedure: LAPAROSCOPIC CHOLECYSTECTOMY WITH POSSIBLE INTRAOPERATIVE CHOLANGIOGRAM   Anesthesia type: General   Pre-op diagnosis: GALLSTONES   Location: [facility] OR ROOM 02 / [facility]   Surgeons: Erroll Luna, MD       DISCUSSION: Patient is a 81 year old female scheduled for the above procedure.  History includes never smoker, HTN, CVA (09/2010), recurrent PE/DVT (s/p IVC filter), Factor V leiden, CAD (CABG 12/11/18: LIMA-LAD, SVG-OM, SVG-dRCA), paroxysmal A-fib, LBBB (09/2021), carotid artery disease (s/p right CEA 11/11/10), CKD (Stage III), hypothyroidism, hiatal hernia, anemia, osteoarthritis (left TKA 2005; right TKA 2005, revision 12/01/21), spinal surgery (L4-S1 PSF 07/11/11), gallstone pancreatitis (09/2021).    Last cardiology visit 11/30/21 with Dr. Bettina Gavia. She was felt to be optimized for orthopedic surgery. S/p right TKA revision 12/01/21. She was advised to hold Xarelto for 3 days for that procedure. Follow-up six months planned.   Per posting, patient to hold Xarelto 2 days prior to surgery. She is a same day work-up, so labs and anesthesia team evaluation on the day of surgery.     VS: BP (!) 148/86   Pulse (!) 52   Temp 37.1 C   Resp 16   Ht '5\' 3"'$  (1.6 m)   Wt 72.6 kg   SpO2 98%   BMI 28.34 kg/m    VS:  BP Readings from Last 3 Encounters:  02/03/22 (!) 172/77  12/03/21 (!) 165/89  11/30/21 (!) 146/58   Pulse Readings from Last 3 Encounters:  02/03/22 83  12/03/21 71  11/30/21 (!) 54     PROVIDERS: [place], Rosalyn Charters, MD is PCP  Shirlee More, MD is Cardiologist  Serafina Mitchell., MD is Vascular Surgeon. Last visit 03/26/18.   LABS: For day of surgery as indicated. Last results include: Lab Results  Component Value Date   WBC 10.7 (H) 02/02/2022   HGB 11.6 (L) 02/02/2022   HCT 37.7 02/02/2022   PLT 408 (H) 02/02/2022   GLUCOSE 153 (H) 02/02/2022   ALT 13 02/02/2022   AST 25  02/02/2022   NA 138 02/02/2022   K 4.8 02/02/2022   CL 101 02/02/2022   CREATININE 1.32 (H) 02/02/2022   BUN 17 02/02/2022   CO2 29 02/02/2022   INR 1.0 10/22/2021    IMAGES: 1V PCXR 10/14/21: FINDINGS: The heart size and mediastinal contours are within normal limits for technique. Both lungs are clear. No pleural effusion or pneumothorax. The visualized skeletal structures are unremarkable. IMPRESSION: No acute process in the chest.   EKG: EKG 02/02/22: Normal sinus rhythm Left axis deviation Left ventricular hypertrophy with repolarization abnormality ( R in aVL , Cornell product , Romhilt-Estes ) Cannot rule out Septal infarct , age undetermined Abnormal ECG Confirmed by Quintella Reichert [phone]) on 02/03/2022 7:22:33 AM  EKG 11/30/21:  Sinus bradycardia at 54 bpm  Occasional PVCs Left BBB.    CV: BLE Venous US 10/18/2021 ([facility]/PACS) IMPRESSION: Negative.   Echo 10/15/2021  1. Left ventricular ejection fraction, by estimation, is 60 to 65%. The  left ventricle has normal function. The left ventricle has no regional  wall motion abnormalities. There is mild concentric left ventricular  hypertrophy. Left ventricular diastolic  parameters are consistent with Grade II diastolic dysfunction  (pseudonormalization).   2. Right ventricular systolic function is mildly reduced. The right  ventricular size is normal. There is mildly elevated pulmonary artery  systolic pressure.   3. Left atrial size was moderately dilated.  4. Right atrial size was moderately dilated.   5. The mitral valve is grossly normal. No evidence of mitral valve  regurgitation.   6. Tricuspid valve regurgitation is mild to moderate.   7. The aortic valve was not well visualized. Aortic valve regurgitation  is not visualized.   8. The inferior vena cava is normal in size with <50% respiratory  variability, suggesting right atrial pressure of 8 mmHg.       14 Day ZioPatch Monitor 08/12/2019 - A  ZIO monitor was performed for 14 days beginning 07/17/2019 to assess for atrial fibrillation.  - The rhythm throughout was sinus with minimum average and maximum heart rates of 31, 47 and 127 bpm. - There were no pauses of 3 seconds or greater.  There is nocturnal sinus bradycardia with rates down to 31 bpm.  Associated with it was brief accelerated idioventricular rhythm at the same rate.  There are no episodes of second-degree third-degree AV block or sinus node exit block. - Ventricular ectopy was rare with isolated PVCs - Supraventricular ectopy was rare with isolated APCs and one 4 beat run of atrial premature contractions.  There were no episodes of atrial fibrillation or flutter. - There were 9 triggered and 4 diary events all associated with sinus rhythm and sinus tachycardia. - Conclusion  1 absence of atrial fibrillation 2 sinus bradycardia with brief accelerated idioventricular rhythm.   US Carotid 12/10/2018 Summary:  - Right Carotid: Velocities in the right ICA are consistent with a low-range 60-79% stenosis.  -Left Carotid: Velocities in the left ICA are consistent with a low-range 40-59% stenosis.  - Vertebrals:  Right vertebral artery exhibits atypical flow. Left vertebral artery demonstrates antegrade flow.  - Subclavians: Normal flow hemodynamics were seen in bilateral subclavian arteries.    Myocardial Perfusion 12/04/2018 Nuclear stress EF: 58%. There was no ST segment deviation noted during stress. Defect 1: There is a medium defect of moderate severity present in the basal inferior, mid inferior and apical inferior location. Findings consistent with ischemia. This is an intermediate risk study. The left ventricular ejection fraction is normal (55-65%). Significant breast attenuation suggested on this study. This makes that study fair quality.   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   Hypothyroidism    Leg pain    with walking   Myocardial infarction [facility])    Occlusion and stenosis of carotid artery without mention of cerebral infarction 10/08/2012   Osteoarthritis  05/26/2011   Osteoporosis    Pancreatitis    PAT (paroxysmal atrial tachycardia) ([place]) 12/05/2018   PE (pulmonary embolism) 05/26/2011   Pneumonia    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[place]) 04/17/2017   Reflux    Renal failure 12/13/2017   S/P IVC filter 12/13/2017   Schatzki's ring 12/13/2017   Stroke ([place])    X's 4   Valvular heart disease 12/13/2017    Past Surgical History:  Procedure Laterality Date   ABDOMINAL HYSTERECTOMY  1980   TAH    APPENDECTOMY     BACK SURGERY  06/2011   BREAST SURGERY     x2   CAROTID ENDARTERECTOMY  11/11/2010   Right CEA   CORONARY ARTERY BYPASS GRAFT N/A 12/11/2018   Procedure: CORONARY ARTERY BYPASS GRAFTING (CABG) x three , using left internal mammary artery, and right leg greater saphenous vein harvested endoscopically - SVG to OM1, SVG to Distal RCA;   Surgeon: Grace Isaac, MD;  Location: [facility];  Service: Open Heart Surgery;  Laterality: N/A;   IVC FILTER INSERTION     JOINT REPLACEMENT Right 2005   Right Total Knee   JOINT REPLACEMENT Left 2005   Left Total Knee   LEFT HEART CATH AND CORONARY ANGIOGRAPHY N/A 12/10/2018   Procedure: LEFT HEART CATH AND CORONARY ANGIOGRAPHY;  Surgeon: Jettie Booze, MD;  Location: [facility] CV LAB;  Service: Cardiovascular;  Laterality: N/A;   SHOULDER SURGERY     right   SPINE SURGERY  07/11/2011   Decomp. Laminectomy, fusion of L4-5,L5-S1 by Dr. Saintclair Halsted   TEE WITHOUT CARDIOVERSION N/A 12/11/2018   Procedure: TRANSESOPHAGEAL ECHOCARDIOGRAM (TEE);  Surgeon: Grace Isaac, MD;  Location: [facility];  Service: Open Heart Surgery;  Laterality: N/A;   TOTAL KNEE ARTHROPLASTY     right  and left   TOTAL KNEE ARTHROPLASTY WITH REVISION COMPONENTS Right 12/01/2021   Procedure: TOTAL KNEE ARTHROPLASTY WITH REVISION COMPONENTS;  Surgeon: Willaim Sheng, MD;  Location: [facility];  Service: Orthopedics;  Laterality: Right;   WISDOM TOOTH EXTRACTION      MEDICATIONS: No current facility-administered medications for this encounter.    acetaminophen (TYLENOL) 500 MG tablet   amiodarone (PACERONE) 200 MG tablet   amLODipine (NORVASC) 5 MG tablet   atorvastatin (LIPITOR) 40 MG tablet   citalopram (CELEXA) 10 MG tablet   citalopram (CELEXA) 40 MG tablet   cloNIDine (CATAPRES) 0.1 MG tablet   famotidine (PEPCID) 20 MG tablet   furosemide (LASIX) 20 MG tablet   levothyroxine (SYNTHROID) 100 MCG tablet   linaclotide (LINZESS) 290 MCG CAPS capsule   metoprolol tartrate (LOPRESSOR) 25 MG tablet   mirtazapine (REMERON) 30 MG tablet   Multiple Vitamin (MULTIVITAMIN) tablet   Oxycodone HCl 10 MG TABS   potassium chloride SA (K-DUR) 20 MEQ tablet   XARELTO 20 MG TABS tablet    Myra Gianotti, PA-C Surgical Short Stay/Anesthesiology [facility] Phone [phone] [facility] Phone [phone] 04/18/2022 12:12  PM

## 2022-04-18 NOTE — Anesthesia Preprocedure Evaluation (Signed)
Anesthesia Evaluation  Patient identified by MRN, date of birth, ID band Patient awake    Reviewed: Allergy & Precautions, NPO status , Patient's Chart, lab work & pertinent test results, reviewed documented beta blocker date and time   History of Anesthesia Complications Negative for: history of anesthetic complications  Airway Mallampati: I  TM Distance: >3 FB Neck ROM: Full    Dental  (+) Edentulous Upper, Edentulous Lower   Pulmonary sleep apnea (does not use CPAP) ,    breath sounds clear to auscultation       Cardiovascular hypertension, Pt. on medications and Pt. on home beta blockers (-) angina+ CAD, + Cardiac Stents, + CABG (2020), + Peripheral Vascular Disease and + DVT  + dysrhythmias Atrial Fibrillation  Rhythm:Irregular Rate:Normal  09/2021 ECHO: EF 60-65%, normal LVF, mild LVH, Grade 2 DD mildly reduced RVF, mild-mod TR   Neuro/Psych Anxiety Depression TIACVA, No Residual Symptoms    GI/Hepatic Neg liver ROS, GERD  Medicated and Controlled,  Endo/Other  Hypothyroidism   Renal/GU Renal InsufficiencyRenal disease     Musculoskeletal  (+) Arthritis ,   Abdominal   Peds  Hematology  (+) Blood dyscrasia (xarelto: last dose sat), , Factor V Leiden: IVC filter   Anesthesia Other Findings   Reproductive/Obstetrics                           Anesthesia Physical Anesthesia Plan  ASA: 3  Anesthesia Plan: General   Post-op Pain Management: Tylenol PO (pre-op)*   Induction: Intravenous  PONV Risk Score and Plan: 3 and Ondansetron, Dexamethasone and Treatment may vary due to age or medical condition  Airway Management Planned: Oral ETT  Additional Equipment: None  Intra-op Plan:   Post-operative Plan: Extubation in OR  Informed Consent: I have reviewed the patients History and Physical, chart, labs and discussed the procedure including the risks, benefits and alternatives for the  proposed anesthesia with the patient or authorized representative who has indicated his/her understanding and acceptance.       Plan Discussed with: CRNA and Surgeon  Anesthesia Plan Comments: (PAT note written 04/18/2022 by Shonna Chock, PA-C. )      Anesthesia Quick Evaluation

## 2022-04-19 ENCOUNTER — Observation Stay (HOSPITAL_COMMUNITY)
Admission: RE | Admit: 2022-04-19 | Discharge: 2022-04-20 | Disposition: A | Discharge status: Home / Self Care | Payer: PPO | Attending: Surgery | Admitting: Surgery

## 2022-04-19 ENCOUNTER — Ambulatory Visit (HOSPITAL_BASED_OUTPATIENT_CLINIC_OR_DEPARTMENT_OTHER): Payer: PPO | Admitting: Vascular Surgery

## 2022-04-19 ENCOUNTER — Encounter (HOSPITAL_COMMUNITY)
Admission: RE | Disposition: A | Discharge status: Home / Self Care | Payer: Self-pay | Source: Home / Self Care | Attending: Surgery

## 2022-04-19 ENCOUNTER — Ambulatory Visit (HOSPITAL_COMMUNITY): Payer: PPO | Admitting: Vascular Surgery

## 2022-04-19 ENCOUNTER — Encounter (HOSPITAL_COMMUNITY): Payer: Self-pay | Admitting: Surgery

## 2022-04-19 ENCOUNTER — Other Ambulatory Visit: Payer: Self-pay

## 2022-04-19 DIAGNOSIS — Z79899 Other long term (current) drug therapy: Secondary | ICD-10-CM | POA: Diagnosis not present

## 2022-04-19 DIAGNOSIS — I10 Essential (primary) hypertension: Secondary | ICD-10-CM | POA: Diagnosis not present

## 2022-04-19 DIAGNOSIS — Z96659 Presence of unspecified artificial knee joint: Secondary | ICD-10-CM | POA: Insufficient documentation

## 2022-04-19 DIAGNOSIS — I130 Hypertensive heart and chronic kidney disease with heart failure and stage 1 through stage 4 chronic kidney disease, or unspecified chronic kidney disease: Secondary | ICD-10-CM | POA: Diagnosis not present

## 2022-04-19 DIAGNOSIS — K8020 Calculus of gallbladder without cholecystitis without obstruction: Secondary | ICD-10-CM | POA: Diagnosis present

## 2022-04-19 DIAGNOSIS — Z86718 Personal history of other venous thrombosis and embolism: Secondary | ICD-10-CM | POA: Insufficient documentation

## 2022-04-19 DIAGNOSIS — I509 Heart failure, unspecified: Secondary | ICD-10-CM | POA: Diagnosis not present

## 2022-04-19 DIAGNOSIS — I2510 Atherosclerotic heart disease of native coronary artery without angina pectoris: Secondary | ICD-10-CM

## 2022-04-19 DIAGNOSIS — Z8673 Personal history of transient ischemic attack (TIA), and cerebral infarction without residual deficits: Secondary | ICD-10-CM | POA: Diagnosis not present

## 2022-04-19 DIAGNOSIS — K8010 Calculus of gallbladder with chronic cholecystitis without obstruction: Secondary | ICD-10-CM

## 2022-04-19 DIAGNOSIS — N189 Chronic kidney disease, unspecified: Secondary | ICD-10-CM | POA: Insufficient documentation

## 2022-04-19 DIAGNOSIS — R1011 Right upper quadrant pain: Secondary | ICD-10-CM | POA: Diagnosis present

## 2022-04-19 DIAGNOSIS — Z8719 Personal history of other diseases of the digestive system: Secondary | ICD-10-CM | POA: Diagnosis not present

## 2022-04-19 HISTORY — PX: LAPAROSCOPIC LYSIS OF ADHESIONS: SHX5905

## 2022-04-19 HISTORY — DX: Sleep apnea, unspecified: G47.30

## 2022-04-19 HISTORY — PX: CHOLECYSTECTOMY: SHX55

## 2022-04-19 LAB — CBC
HCT: 35.1 % — ABNORMAL LOW (ref 36.0–46.0)
Hemoglobin: 10.2 g/dL — ABNORMAL LOW (ref 12.0–15.0)
MCH: 25.6 pg — ABNORMAL LOW (ref 26.0–34.0)
MCHC: 29.1 g/dL — ABNORMAL LOW (ref 30.0–36.0)
MCV: 88.2 fL (ref 80.0–100.0)
Platelets: 287 10*3/uL (ref 150–400)
RBC: 3.98 MIL/uL (ref 3.87–5.11)
RDW: 16 % — ABNORMAL HIGH (ref 11.5–15.5)
WBC: 6.2 10*3/uL (ref 4.0–10.5)
nRBC: 0 % (ref 0.0–0.2)

## 2022-04-19 LAB — BASIC METABOLIC PANEL
Anion gap: 10 (ref 5–15)
BUN: 14 mg/dL (ref 8–23)
CO2: 21 mmol/L — ABNORMAL LOW (ref 22–32)
Calcium: 8.8 mg/dL — ABNORMAL LOW (ref 8.9–10.3)
Chloride: 107 mmol/L (ref 98–111)
Creatinine, Ser: 1.16 mg/dL — ABNORMAL HIGH (ref 0.44–1.00)
GFR, Estimated: 48 mL/min — ABNORMAL LOW (ref >60)
Glucose, Bld: 101 mg/dL — ABNORMAL HIGH (ref 70–99)
Potassium: 4.3 mmol/L (ref 3.5–5.1)
Sodium: 138 mmol/L (ref 135–145)

## 2022-04-19 SURGERY — LAPAROSCOPIC CHOLECYSTECTOMY
Anesthesia: General | Site: Abdomen

## 2022-04-19 MED ORDER — HYDROMORPHONE HCL 1 MG/ML IJ SOLN
1.0000 mg | INTRAMUSCULAR | Status: DC | PRN
  Administered 2022-04-19 – 2022-04-20 (×3): 1 mg via INTRAVENOUS
  Filled 2022-04-19 (×3): qty 1

## 2022-04-19 MED ORDER — BUPIVACAINE-EPINEPHRINE 0.25% -1:200000 IJ SOLN
INTRAMUSCULAR | Status: DC | PRN
  Administered 2022-04-19: 10 mL

## 2022-04-19 MED ORDER — PROMETHAZINE HCL 25 MG/ML IJ SOLN: 6.2500 mg | INTRAMUSCULAR | Status: DC | PRN

## 2022-04-19 MED ORDER — PHENYLEPHRINE HCL-NACL 20-0.9 MG/250ML-% IV SOLN
INTRAVENOUS | Status: DC | PRN
  Administered 2022-04-19: 30 ug/min via INTRAVENOUS

## 2022-04-19 MED ORDER — LIDOCAINE 2% (20 MG/ML) 5 ML SYRINGE
INTRAMUSCULAR | Status: AC
Start: 2022-04-19 — End: ?
  Filled 2022-04-19: qty 5

## 2022-04-19 MED ORDER — ATORVASTATIN CALCIUM 40 MG PO TABS
40.0000 mg | ORAL_TABLET | Freq: Every day | ORAL | Status: DC
  Administered 2022-04-19 – 2022-04-20 (×2): 40 mg via ORAL
  Filled 2022-04-19 (×2): qty 1

## 2022-04-19 MED ORDER — ONDANSETRON 4 MG PO TBDP
4.0000 mg | ORAL_TABLET | Freq: Four times a day (QID) | ORAL | Status: DC | PRN
  Administered 2022-04-19 – 2022-04-20 (×2): 4 mg via ORAL
  Filled 2022-04-19 (×2): qty 1

## 2022-04-19 MED ORDER — HEMOSTATIC AGENTS (NO CHARGE) OPTIME
TOPICAL | Status: DC | PRN
  Administered 2022-04-19: 1 via TOPICAL

## 2022-04-19 MED ORDER — CHLORHEXIDINE GLUCONATE 0.12 % MT SOLN
15.0000 mL | Freq: Once | OROMUCOSAL | Status: AC
  Administered 2022-04-19: 15 mL via OROMUCOSAL
  Filled 2022-04-19: qty 15

## 2022-04-19 MED ORDER — OXYCODONE HCL 5 MG PO TABS
ORAL_TABLET | ORAL | Status: AC
  Filled 2022-04-19: qty 1

## 2022-04-19 MED ORDER — FUROSEMIDE 20 MG PO TABS
20.0000 mg | ORAL_TABLET | Freq: Every day | ORAL | Status: DC
  Filled 2022-04-19 (×2): qty 1

## 2022-04-19 MED ORDER — METHOCARBAMOL 500 MG PO TABS
ORAL_TABLET | ORAL | Status: AC
  Filled 2022-04-19: qty 1

## 2022-04-19 MED ORDER — CITALOPRAM HYDROBROMIDE 20 MG PO TABS
10.0000 mg | ORAL_TABLET | Freq: Every day | ORAL | Status: DC
Start: 2022-04-19 — End: 2022-04-19

## 2022-04-19 MED ORDER — CEFAZOLIN SODIUM-DEXTROSE 2-4 GM/100ML-% IV SOLN
2.0000 g | INTRAVENOUS | Status: AC
  Administered 2022-04-19: 2 g via INTRAVENOUS
  Filled 2022-04-19: qty 100

## 2022-04-19 MED ORDER — FAMOTIDINE 20 MG PO TABS: 20.0000 mg | ORAL_TABLET | Freq: Every day | ORAL | Status: DC | PRN

## 2022-04-19 MED ORDER — PROPOFOL 10 MG/ML IV BOLUS
INTRAVENOUS | Status: AC
  Filled 2022-04-19: qty 20

## 2022-04-19 MED ORDER — SUGAMMADEX SODIUM 200 MG/2ML IV SOLN
INTRAVENOUS | Status: DC | PRN
  Administered 2022-04-19: 150 mg via INTRAVENOUS

## 2022-04-19 MED ORDER — ACETAMINOPHEN 500 MG PO TABS
1000.0000 mg | ORAL_TABLET | ORAL | Status: AC
  Administered 2022-04-19: 1000 mg via ORAL
  Filled 2022-04-19: qty 2

## 2022-04-19 MED ORDER — DEXTROSE-NACL 5-0.9 % IV SOLN
INTRAVENOUS | Status: DC
Start: 2022-04-19 — End: 2022-04-20

## 2022-04-19 MED ORDER — ONDANSETRON HCL 4 MG/2ML IJ SOLN: 4.0000 mg | Freq: Four times a day (QID) | INTRAMUSCULAR | Status: DC | PRN

## 2022-04-19 MED ORDER — PHENYLEPHRINE HCL (PRESSORS) 10 MG/ML IV SOLN
INTRAVENOUS | Status: AC
  Filled 2022-04-19: qty 1

## 2022-04-19 MED ORDER — ENOXAPARIN SODIUM 40 MG/0.4ML IJ SOSY: 40.0000 mg | PREFILLED_SYRINGE | INTRAMUSCULAR | Status: DC

## 2022-04-19 MED ORDER — ACETAMINOPHEN 500 MG PO TABS
500.0000 mg | ORAL_TABLET | Freq: Four times a day (QID) | ORAL | Status: DC | PRN
  Administered 2022-04-19: 1000 mg via ORAL

## 2022-04-19 MED ORDER — MIRTAZAPINE 15 MG PO TABS
30.0000 mg | ORAL_TABLET | Freq: Every day | ORAL | Status: DC
Start: 2022-04-19 — End: 2022-04-20
  Administered 2022-04-19: 30 mg via ORAL
  Filled 2022-04-19: qty 2

## 2022-04-19 MED ORDER — LACTATED RINGERS IV SOLN: INTRAVENOUS | Status: DC

## 2022-04-19 MED ORDER — METOPROLOL TARTRATE 12.5 MG HALF TABLET
12.5000 mg | ORAL_TABLET | Freq: Every day | ORAL | Status: DC
  Administered 2022-04-19: 12.5 mg via ORAL
  Filled 2022-04-19 (×2): qty 1

## 2022-04-19 MED ORDER — CHLORHEXIDINE GLUCONATE CLOTH 2 % EX PADS: 6.0000 | MEDICATED_PAD | Freq: Once | CUTANEOUS | Status: DC

## 2022-04-19 MED ORDER — FENTANYL CITRATE (PF) 250 MCG/5ML IJ SOLN
INTRAMUSCULAR | Status: AC
  Filled 2022-04-19: qty 5

## 2022-04-19 MED ORDER — EPHEDRINE SULFATE-NACL 50-0.9 MG/10ML-% IV SOSY
PREFILLED_SYRINGE | INTRAVENOUS | Status: DC | PRN
  Administered 2022-04-19 (×2): 5 mg via INTRAVENOUS

## 2022-04-19 MED ORDER — ORAL CARE MOUTH RINSE: 15.0000 mL | Freq: Once | OROMUCOSAL | Status: AC

## 2022-04-19 MED ORDER — FENTANYL CITRATE (PF) 100 MCG/2ML IJ SOLN
INTRAMUSCULAR | Status: DC | PRN
  Administered 2022-04-19: 50 ug via INTRAVENOUS
  Administered 2022-04-19: 25 ug via INTRAVENOUS
  Administered 2022-04-19: 50 ug via INTRAVENOUS
  Administered 2022-04-19: 25 ug via INTRAVENOUS

## 2022-04-19 MED ORDER — ROCURONIUM BROMIDE 100 MG/10ML IV SOLN
INTRAVENOUS | Status: DC | PRN
  Administered 2022-04-19 (×2): 10 mg via INTRAVENOUS
  Administered 2022-04-19: 60 mg via INTRAVENOUS

## 2022-04-19 MED ORDER — ZOLPIDEM TARTRATE 5 MG PO TABS: 5.0000 mg | ORAL_TABLET | Freq: Every evening | ORAL | Status: DC | PRN

## 2022-04-19 MED ORDER — ACETAMINOPHEN 500 MG PO TABS
ORAL_TABLET | ORAL | Status: AC
  Filled 2022-04-19: qty 2

## 2022-04-19 MED ORDER — ONDANSETRON HCL 4 MG/2ML IJ SOLN
INTRAMUSCULAR | Status: DC | PRN
  Administered 2022-04-19: 4 mg via INTRAVENOUS

## 2022-04-19 MED ORDER — ROCURONIUM BROMIDE 10 MG/ML (PF) SYRINGE
PREFILLED_SYRINGE | INTRAVENOUS | Status: AC
  Filled 2022-04-19: qty 10

## 2022-04-19 MED ORDER — OXYCODONE HCL 5 MG PO TABS
5.0000 mg | ORAL_TABLET | ORAL | Status: DC | PRN
  Administered 2022-04-19: 10 mg via ORAL
  Administered 2022-04-19: 5 mg via ORAL
  Administered 2022-04-20 (×2): 10 mg via ORAL
  Filled 2022-04-19 (×3): qty 2

## 2022-04-19 MED ORDER — HYDROMORPHONE HCL 1 MG/ML IJ SOLN
INTRAMUSCULAR | Status: AC
  Filled 2022-04-19: qty 1

## 2022-04-19 MED ORDER — PHENYLEPHRINE 80 MCG/ML (10ML) SYRINGE FOR IV PUSH (FOR BLOOD PRESSURE SUPPORT)
PREFILLED_SYRINGE | INTRAVENOUS | Status: DC | PRN
  Administered 2022-04-19 (×4): 80 ug via INTRAVENOUS

## 2022-04-19 MED ORDER — LIDOCAINE 2% (20 MG/ML) 5 ML SYRINGE
INTRAMUSCULAR | Status: DC | PRN
  Administered 2022-04-19: 40 mg via INTRAVENOUS

## 2022-04-19 MED ORDER — CLONIDINE HCL 0.1 MG PO TABS
0.1000 mg | ORAL_TABLET | Freq: Every day | ORAL | Status: DC
  Administered 2022-04-19: 0.1 mg via ORAL
  Filled 2022-04-19: qty 1

## 2022-04-19 MED ORDER — AMIODARONE HCL 200 MG PO TABS
200.0000 mg | ORAL_TABLET | Freq: Every day | ORAL | Status: DC
  Administered 2022-04-19 – 2022-04-20 (×2): 200 mg via ORAL
  Filled 2022-04-19 (×2): qty 1

## 2022-04-19 MED ORDER — HYDROMORPHONE HCL 1 MG/ML IJ SOLN
0.2500 mg | INTRAMUSCULAR | Status: DC | PRN
  Administered 2022-04-19: 0.25 mg via INTRAVENOUS
  Administered 2022-04-19: 0.5 mg via INTRAVENOUS
  Administered 2022-04-19: 0.25 mg via INTRAVENOUS

## 2022-04-19 MED ORDER — OXYCODONE HCL 5 MG PO TABS
5.0000 mg | ORAL_TABLET | Freq: Once | ORAL | Status: AC | PRN
  Administered 2022-04-19: 5 mg via ORAL

## 2022-04-19 MED ORDER — ACETAMINOPHEN 650 MG RE SUPP: 650.0000 mg | Freq: Four times a day (QID) | RECTAL | Status: DC | PRN

## 2022-04-19 MED ORDER — INDOCYANINE GREEN 25 MG IV SOLR
INTRAVENOUS | Status: DC | PRN
  Administered 2022-04-19: 2.5 mg via INTRAVENOUS

## 2022-04-19 MED ORDER — MIDAZOLAM HCL 2 MG/2ML IJ SOLN: 0.5000 mg | Freq: Once | INTRAMUSCULAR | Status: DC | PRN

## 2022-04-19 MED ORDER — PROPOFOL 10 MG/ML IV BOLUS
INTRAVENOUS | Status: DC | PRN
  Administered 2022-04-19: 100 mg via INTRAVENOUS

## 2022-04-19 MED ORDER — LEVOTHYROXINE SODIUM 100 MCG PO TABS
100.0000 ug | ORAL_TABLET | Freq: Every day | ORAL | Status: DC
  Administered 2022-04-19: 100 ug via ORAL
  Filled 2022-04-19: qty 1

## 2022-04-19 MED ORDER — ONDANSETRON HCL 4 MG/2ML IJ SOLN
INTRAMUSCULAR | Status: AC
  Filled 2022-04-19: qty 2

## 2022-04-19 MED ORDER — SODIUM CHLORIDE 0.9 % IR SOLN
Status: DC | PRN
  Administered 2022-04-19: 1000 mL

## 2022-04-19 MED ORDER — CITALOPRAM HYDROBROMIDE 40 MG PO TABS: 40.0000 mg | ORAL_TABLET | Freq: Every day | ORAL | Status: DC

## 2022-04-19 MED ORDER — OXYCODONE HCL 5 MG/5ML PO SOLN: 5.0000 mg | Freq: Once | ORAL | Status: AC | PRN

## 2022-04-19 MED ORDER — AMLODIPINE BESYLATE 5 MG PO TABS
5.0000 mg | ORAL_TABLET | Freq: Every day | ORAL | Status: DC
  Administered 2022-04-19 – 2022-04-20 (×2): 5 mg via ORAL
  Filled 2022-04-19 (×2): qty 1

## 2022-04-19 MED ORDER — METOPROLOL TARTRATE 5 MG/5ML IV SOLN: 5.0000 mg | Freq: Four times a day (QID) | INTRAVENOUS | Status: DC | PRN

## 2022-04-19 MED ORDER — ACETAMINOPHEN 325 MG PO TABS
650.0000 mg | ORAL_TABLET | Freq: Four times a day (QID) | ORAL | Status: DC | PRN
  Administered 2022-04-20: 650 mg via ORAL
  Filled 2022-04-19: qty 2

## 2022-04-19 MED ORDER — BUPIVACAINE-EPINEPHRINE (PF) 0.25% -1:200000 IJ SOLN
INTRAMUSCULAR | Status: AC
Start: 2022-04-19 — End: ?
  Filled 2022-04-19: qty 30

## 2022-04-19 MED ORDER — CITALOPRAM HYDROBROMIDE 40 MG PO TABS
50.0000 mg | ORAL_TABLET | Freq: Every day | ORAL | Status: DC
  Administered 2022-04-19: 50 mg via ORAL
  Filled 2022-04-19: qty 1

## 2022-04-19 MED ORDER — METHOCARBAMOL 500 MG PO TABS
500.0000 mg | ORAL_TABLET | Freq: Four times a day (QID) | ORAL | Status: DC | PRN
  Administered 2022-04-19 – 2022-04-20 (×3): 500 mg via ORAL
  Filled 2022-04-19 (×2): qty 1

## 2022-04-19 MED ORDER — 0.9 % SODIUM CHLORIDE (POUR BTL) OPTIME
TOPICAL | Status: DC | PRN
  Administered 2022-04-19: 1000 mL

## 2022-04-19 MED ORDER — GLYCOPYRROLATE 0.2 MG/ML IJ SOLN
INTRAMUSCULAR | Status: DC | PRN
  Administered 2022-04-19: .1 mg via INTRAVENOUS

## 2022-04-19 SURGICAL SUPPLY — 42 items
APPLICATOR ARISTA FLEXITIP XL (MISCELLANEOUS) ×1 IMPLANT
APPLIER CLIP ROT 10 11.4 M/L (STAPLE) ×2
BAG COUNTER SPONGE SURGICOUNT (BAG) ×2 IMPLANT
BLADE CLIPPER SURG (BLADE) IMPLANT
CANISTER SUCT 3000ML PPV (MISCELLANEOUS) ×2 IMPLANT
CHLORAPREP W/TINT 26 (MISCELLANEOUS) ×2 IMPLANT
CLIP APPLIE ROT 10 11.4 M/L (STAPLE) ×1 IMPLANT
COVER SURGICAL LIGHT HANDLE (MISCELLANEOUS) ×2 IMPLANT
DERMABOND ADVANCED (GAUZE/BANDAGES/DRESSINGS) ×1
DERMABOND ADVANCED .7 DNX12 (GAUZE/BANDAGES/DRESSINGS) ×1 IMPLANT
ELECT REM PT RETURN 9FT ADLT (ELECTROSURGICAL) ×2
ELECTRODE REM PT RTRN 9FT ADLT (ELECTROSURGICAL) ×1 IMPLANT
GLOVE BIO SURGEON STRL SZ8 (GLOVE) ×2 IMPLANT
GLOVE BIOGEL PI IND STRL 8 (GLOVE) ×1 IMPLANT
GLOVE BIOGEL PI INDICATOR 8 (GLOVE) ×1
GOWN STRL REUS W/ TWL LRG LVL3 (GOWN DISPOSABLE) ×2 IMPLANT
GOWN STRL REUS W/ TWL XL LVL3 (GOWN DISPOSABLE) ×1 IMPLANT
GOWN STRL REUS W/TWL LRG LVL3 (GOWN DISPOSABLE) ×2
GOWN STRL REUS W/TWL XL LVL3 (GOWN DISPOSABLE) ×1
HEMOSTAT ARISTA ABSORB 3G PWDR (HEMOSTASIS) ×1 IMPLANT
KIT BASIN OR (CUSTOM PROCEDURE TRAY) ×2 IMPLANT
KIT IMAGING PINPOINTPAQ (MISCELLANEOUS) ×1 IMPLANT
KIT TURNOVER KIT B (KITS) ×2 IMPLANT
NS IRRIG 1000ML POUR BTL (IV SOLUTION) ×2 IMPLANT
PAD ARMBOARD 7.5X6 YLW CONV (MISCELLANEOUS) ×2 IMPLANT
POUCH RETRIEVAL ECOSAC 10 (ENDOMECHANICALS) ×1 IMPLANT
POUCH RETRIEVAL ECOSAC 10MM (ENDOMECHANICALS) ×1
SCISSORS LAP 5X35 DISP (ENDOMECHANICALS) ×2 IMPLANT
SET IRRIG TUBING LAPAROSCOPIC (IRRIGATION / IRRIGATOR) ×2 IMPLANT
SET TUBE SMOKE EVAC HIGH FLOW (TUBING) ×2 IMPLANT
SLEEVE Z-THREAD 5X100MM (TROCAR) ×3 IMPLANT
SPECIMEN JAR SMALL (MISCELLANEOUS) ×2 IMPLANT
SUT MNCRL AB 4-0 PS2 18 (SUTURE) ×2 IMPLANT
SUT VICRYL 0 UR6 27IN ABS (SUTURE) ×1 IMPLANT
TOWEL GREEN STERILE (TOWEL DISPOSABLE) ×2 IMPLANT
TOWEL GREEN STERILE FF (TOWEL DISPOSABLE) ×2 IMPLANT
TRAY LAPAROSCOPIC MC (CUSTOM PROCEDURE TRAY) ×2 IMPLANT
TROCAR 11X100 Z THREAD (TROCAR) ×1 IMPLANT
TROCAR BALLN 12MMX100 BLUNT (TROCAR) ×1 IMPLANT
TROCAR Z-THREAD OPTICAL 5X100M (TROCAR) ×1 IMPLANT
WARMER LAPAROSCOPE (MISCELLANEOUS) ×2 IMPLANT
WATER STERILE IRR 1000ML POUR (IV SOLUTION) ×2 IMPLANT

## 2022-04-19 NOTE — Care Management Important Message (Signed)
Important Message  Patient Details  Name: Tina Dominguez MRN: [id number] Date of Birth: 06-03-1941   Medicare Important Message Given:  Yes     Orbie Pyo 04/19/2022, 3:23 PM

## 2022-04-19 NOTE — Op Note (Addendum)
Preoperative diagnosis: Gallstones and chronic cholecystitis  Postoperative diagnosis: Gallstones chronic cholecystitis and history of gallstone pancreatitis  Procedure: Laparoscopic cholecystectomy with lysis of adhesions  Surgeon: Erroll Luna, MD  Assistant: Dr. Clerance Lav MD  Anesthesia: General with 0.25% Marcaine plain  EBL: 10 cc  Specimen: Gallbladder to pathology  IV fluids: Per OR record  Indications for procedure: The patient is a 81 year old female with history of gallstones cholecystitis and possibly history of gallstone pancreatitis.  That all resolved but she presents for elective cholecystectomy due to her above symptoms.  Risks and benefits of surgery reviewed with the patient.  Alternatives were reviewed with the patient.The procedure has been discussed with the patient. Operative and non operative treatments have been discussed. Risks of surgery include bleeding, infection,  Common bile duct injury,  Injury to the stomach,liver, colon,small intestine, abdominal wall,  Diaphragm,  Major blood vessels,  And the need for an open procedure.  Other risks include worsening of medical problems, death,  DVT and pulmonary embolism, and cardiovascular events.   Medical options have also been discussed. The patient has been informed of long term expectations of surgery and non surgical options,  The patient agrees to proceed.      Description of procedure: The patient was met in the holding area and questions were answered.  She is taken back to the operative room placed upon upon the OR table.  After induction of general esthesia, the abdomen was prepped and draped in sterile fashion timeout performed.  Patient a previous midline scar from just above the umbilicus to the pubic symphysis.  Incision was made just above this in the midline.  Dissection was carried down and the fascia was identified.  Incision was made the fascia and entered the abdominal cavity without  difficulty.  A balloon port was placed and a pursestring suture placed to secure this.  Pneumoperitoneum was created to 15 mmHg CO2 pressure.  There were dense intra-abdominal adhesions.  Is able to find some free space and placed a port in the patient's epigastrium.  This was a 10/11 mm port.  Once this was placed was able to place a 5 mm port in the right upper quadrant.  Looking at the adhesions were quite dense.  These were taken down carefully toward the balloon port site.  Once or created space we could easily see this.  There is some small bowel adherent to the anterior abdominal wall and care was taken not to injure that.  Some this was taken down to create space but the majority is left in place since it extended down into the pelvis.  Upon examination the small bowel there is no evidence of injury.  Patient was then placed in Trendelenburg.  Of note lysis of adhesions took about 40 minutes.  We then placed an additional 5 mm port in the right upper quadrant under direct vision.  She had a very large floppy left lobe of her liver.  The gallbladder was quite lengthy.  I was able to grab the dome and retracted upward to the patient's right shoulder but the left lobe was quite floppy and flopped over the infundibulum.  We rolled the patient more to her left unfortunately could not visualize this well.  I placed an additional 5 mm port in the left upper quadrant and a grasper was put through this to help retract the left lobe off the gallbladder.  Once doing so we could visualize the infundibulum.  Of note she received  ICG dye 30 minutes prior to the procedure.  The gallbladder infundibulum was grasped and pulled the patient's right lower quadrant.  This exposed the triangle of CLO.  We dissected this out to obtain the critical view.  We then used fluorescence with the ICG dye to visualize the gallbladder, the cystic duct and actually could see if this common bile duct which was well out of her operative field  with the ICG visualization.  Once this was identified we were able to clear the critical view out to to identify the cystic duct and cystic artery as the only tube structures into the gallbladder.  Due to the small size of her cystic duct, we did not perform a cholangiogram.  We then clipped the cystic duct and divided.  Cystic artery was clipped and divided as well.  We then used cautery to dissect the gallbladder from the gallbladder bed.  Of note the posterior cystic artery identified and clipped well up on the gallbladder body.  Remainder the gallbladder was removed with cautery.  An EKOS sac was used and the gallbladder was placed in this.  We examined the gallbladder bed and is hemostatic with no bleeding or bile leakage.  Arista was placed in the gallbladder bed.  We then extracted the gallbladder through the subxiphoid port and passed off the field.  Four-quadrant laparoscopy performed.  There is no signs of bleeding or bile leakage.  I reexamined the small intestine and there is no signs of injury with the lysis of adhesions.  We removed the umbilical port under direct vision and closed this with a pursestring suture that was already in place and there is no evidence of this causing any kinking of the bowel or any twisting of the bowel.  The remainder of the laparoscopy was normal at this point in time.  We then went ahead and removed all 4 ports allow the CO2 to escape.  The subxiphoid fascia was closed with interrupted 0 Vicryl.  All skin incisions were closed with 4 Monocryl.  Dermabond was applied.  All counts found to be correct.  The patient was then awoke extubated taken to recovery in satisfactory condition.

## 2022-04-19 NOTE — Interval H&P Note (Signed)
History and Physical Interval Note:  04/19/2022 8:16 AM  Tina Dominguez  has presented today for surgery, with the diagnosis of GALLSTONES.  The various methods of treatment have been discussed with the patient and family. After consideration of risks, benefits and other options for treatment, the patient has consented to  Procedure(s): LAPAROSCOPIC CHOLECYSTECTOMY WITH POSSIBLE INTRAOPERATIVE CHOLANGIOGRAM (N/A) as a surgical intervention.  The patient's history has been reviewed, patient examined, no change in status, stable for surgery.  I have reviewed the patient's chart and labs.  Questions were answered to the patient's satisfaction.   The procedure has been discussed with the patient. Operative and non operative treatments have been discussed. Risks of surgery include bleeding, infection,  Common bile duct injury,  Injury to the stomach,liver, colon,small intestine, abdominal wall,  Diaphragm,  Major blood vessels,  And the need for an open procedure.  Other risks include worsening of medical problems, death,  DVT and pulmonary embolism, and cardiovascular events.   Medical options have also been discussed. The patient has been informed of long term expectations of surgery and non surgical options,  The patient agrees to proceed.     [place]

## 2022-04-19 NOTE — Discharge Instructions (Signed)
[facility] ______[facility] LAPAROSCOPIC SURGERY: POST OP INSTRUCTIONS Always review your discharge instruction sheet given to you by the facility where your surgery was performed. IF YOU HAVE DISABILITY OR FAMILY LEAVE FORMS, YOU MUST BRING THEM TO THE OFFICE FOR PROCESSING.   DO NOT GIVE THEM TO YOUR DOCTOR.  A prescription for pain medication may be given to you upon discharge.  Take your pain medication as prescribed, if needed.  If narcotic pain medicine is not needed, then you may take acetaminophen (Tylenol) or ibuprofen (Advil) as needed. Take your usually prescribed medications unless otherwise directed. If you need a refill on your pain medication, please contact your pharmacy.  They will contact our office to request authorization. Prescriptions will not be filled after 5pm or on week-ends. You should follow a light diet the first few days after arrival home, such as soup and crackers, etc.  Be sure to include lots of fluids daily. Most patients will experience some swelling and bruising in the area of the incisions.  Ice packs will help.  Swelling and bruising can take several days to resolve.  It is common to experience some constipation if taking pain medication after surgery.  Increasing fluid intake and taking a stool softener (such as Colace) will usually help or prevent this problem from occurring.  A mild laxative (Milk of Magnesia or Miralax) should be taken according to package instructions if there are no bowel movements after 48 hours. Unless discharge instructions indicate otherwise, you may remove your bandages 24-48 hours after surgery, and you may shower at that time.  You may have steri-strips (small skin tapes) in place directly over the incision.  These strips should be left on the skin for 7-10 days.  If your surgeon used skin glue on the incision, you may shower in 24 hours.  The glue will flake off over the next 2-3 weeks.  Any sutures or staples will be  removed at the office during your follow-up visit. ACTIVITIES:  You may resume regular (light) daily activities beginning the next day--such as daily self-care, walking, climbing stairs--gradually increasing activities as tolerated.  You may have sexual intercourse when it is comfortable.  Refrain from any heavy lifting or straining until approved by your doctor. You may drive when you are no longer taking prescription pain medication, you can comfortably wear a seatbelt, and you can safely maneuver your car and apply brakes. RETURN TO WORK:  __________________________________________________________ Tina Dominguez should see your doctor in the office for a follow-up appointment approximately 2-3 weeks after your surgery.  Make sure that you call for this appointment within a day or two after you arrive home to insure a convenient appointment time. OTHER INSTRUCTIONS: __________________________________________________________________________________________________________________________ __________________________________________________________________________________________________________________________ WHEN TO CALL YOUR DOCTOR: Fever over 101.0 Inability to urinate Continued bleeding from incision. Increased pain, redness, or drainage from the incision. Increasing abdominal pain  The clinic staff is available to answer your questions during regular business hours.  Please don't hesitate to call and ask to speak to one of the nurses for clinical concerns.  If you have a medical emergency, go to the nearest emergency room or call 911.  A surgeon from [facility] is always on call at the hospital. [address], [address], [place], [place]  [address] ? [address] [address], [place], [place]   [address] [phone] ? [phone] ? FAX [phone] Web site: [email or web address]       RESTART BLOOD THINNER TOMORROW  USE  HOME OXYGEN AS  NEEDED ESPECIALLY IF SHORT OF BREATH

## 2022-04-19 NOTE — H&P (Signed)
Patient presents for evaluation of gallstones. History of right upper quadrant pain which is episodic over the last 6 months. Admitted in January 2023 for cardiovascular issues was found to have pancreatitis which was felt to be gallstone related. She has had 2 attacks since then. She was last seen in the emergency room last month. She presents today to discuss elective cholecystectomy for symptomatic cholelithiasis. Relates no issue with eating fatty foods but the pain usually happens after meals. No nausea or vomiting. No chest pain or shortness of breath. She had multiple knee replacements.  Review of Systems: A complete review of systems was obtained from the patient. I have reviewed this information and discussed as appropriate with the patient. See HPI as well for other ROS.    Medical History: Past Medical History:  Diagnosis Date   Anemia   Anxiety   Arrhythmia   Arthritis   CHF (congestive heart failure) (CMS-HCC)   Chronic kidney disease   DVT (deep venous thrombosis) (CMS-HCC)   GERD (gastroesophageal reflux disease)   History of stroke   Hyperlipidemia   Hypertension   Sleep apnea   Thyroid disease   There is no problem list on file for this patient.  Past Surgical History:  Procedure Laterality Date   Back Surgery   JOINT REPLACEMENT  knee x 3 2005, 2023    No Known Allergies  Current Outpatient Medications on File Prior to Visit  Medication Sig Dispense Refill   AMIOdarone (PACERONE) 200 MG tablet 1 tablet   atorvastatin (LIPITOR) 40 MG tablet 1 tablet   FUROsemide (LASIX) 20 MG tablet 1 tablet   potassium chloride (KLOR-CON) 20 MEQ ER tablet 1 tablet with food   amLODIPine (NORVASC) 5 MG tablet Take 1 tablet by mouth once daily   cefadroxil (DURICEF) 500 mg capsule TAKE 1 CAPSULE BY MOUTH TWICE DAILY AS DIRECTED   citalopram (CELEXA) 10 MG tablet Take by mouth   cloNIDine HCL (CATAPRES) 0.1 MG tablet Take 1 tablet by mouth once daily   famotidine (PEPCID)  20 MG tablet Take by mouth   levothyroxine (SYNTHROID) 100 MCG tablet 1 tablet   linaCLOtide (LINZESS) 145 mcg capsule Take by mouth   methocarbamoL (ROBAXIN) 500 MG tablet Take 500 mg by mouth every 6 (six) hours as needed for Pain   metoprolol succinate (TOPROL-XL) 25 MG XL tablet Take 25 mg by mouth once daily   mirtazapine (REMERON) 30 MG tablet Take 1 tablet by mouth every evening   multivitamin tablet Take 1 tablet by mouth once daily   oxyCODONE (ROXICODONE) 5 MG immediate release tablet Take 5 mg by mouth every 4 (four) hours   rivaroxaban (XARELTO) 20 mg tablet 1 tablet with food   No current facility-administered medications on file prior to visit.   History reviewed. No pertinent family history.   Social History   Tobacco Use  Smoking Status Never  Smokeless Tobacco Never    Social History   Socioeconomic History   Marital status: Single  Tobacco Use   Smoking status: Never   Smokeless tobacco: Never  Substance and Sexual Activity   Alcohol use: Not Currently   Drug use: Never   Objective:   Vitals:  02/25/22 0920  BP: (!) 160/62  Pulse: 60  Temp: 36.7 C (98 F)  SpO2: 96%  Weight: 71.2 kg (157 lb)  Height: 160 cm ('5\' 3"'$ )   Body mass index is 27.81 kg/m.    General appearance - alert, well appearing, and in no  distress Neck - supple, no significant adenopathy Chest - clear to auscultation, no wheezes, rales or rhonchi, symmetric air entry Heart - normal rate, regular rhythm, normal S1, S2, no murmurs, rubs, clicks or gallops, nsr Abdomen - soft, nontender, nondistended, no masses or organomegaly Musculoskeletal - no joint tenderness, deformity or swelling, walks with a cane significant knee swelling/pain   Labs, Imaging and Diagnostic Testing: CLINICAL DATA: Right upper quadrant pain   EXAM: ULTRASOUND ABDOMEN LIMITED RIGHT UPPER QUADRANT   COMPARISON: Abdominal CT from earlier today   FINDINGS: Gallbladder:   Gallstones without  gallbladder wall thickening or focal tenderness.   Common bile duct:   Diameter: 6 mm. Where visualized, no filling defect.   Liver:   No focal lesion identified. Within normal limits in parenchymal echogenicity. Portal vein is patent on color Doppler imaging with normal direction of blood flow towards the liver.   IMPRESSION: Cholelithiasis without cholecystitis or biliary dilatation.     Electronically Signed By: Jorje Guild M.D. On: 02/03/2022 07:27  Assessment and Plan:   Diagnoses and all orders for this visit:  Calculus of gallbladder with chronic cholecystitis without obstruction    History of recurrent biliary colic and gallstone pancreatitis January 2023. Recommend laparoscopic cholecystectomy possible cholangiogram. She will require her to hold her anticoagulation 2 days before and 2 days after. Risks and benefits of surgery discussed. The risk of bleeding, infection, exacerbation of underlying medical problems, common bile duct injury, bile leak, injury to neighboring organs/structures, need for additional procedures in this endoscopy, death, DVT, and the need for open surgery and/or other treatments. She agrees to proceed.  No follow-ups on file.  Kennieth Francois, MD

## 2022-04-19 NOTE — Anesthesia Postprocedure Evaluation (Signed)
Anesthesia Post Note  Patient: Tina Dominguez  Procedure(s) Performed: LAPAROSCOPIC CHOLECYSTECTOMY (Abdomen) LAPAROSCOPIC LYSIS OF ADHESIONS (Abdomen) INDOCYANINE GREEN FLUORESCENCE IMAGING (ICG) (Abdomen)     Patient location during evaluation: PACU Anesthesia Type: General Level of consciousness: awake and alert, patient cooperative and oriented Pain management: pain level controlled Vital Signs Assessment: post-procedure vital signs reviewed and stable Respiratory status: spontaneous breathing, nonlabored ventilation, respiratory function stable and patient connected to nasal cannula oxygen Cardiovascular status: blood pressure returned to baseline and stable Postop Assessment: no apparent nausea or vomiting Anesthetic complications: no   No notable events documented.  Last Vitals:  Vitals:   04/19/22 1245 04/19/22 1300  BP: (!) 124/47 (!) 124/58  Pulse: (!) 59 60  Resp: 11 14  Temp:    SpO2: 93% 92%    Last Pain:  Vitals:   04/19/22 1300  PainSc: 0-No pain                 Jacier Gladu,E. Tegh Franek

## 2022-04-19 NOTE — Care Management Important Message (Signed)
Important Message  Patient Details  Name: Tina Dominguez MRN: [id number] Date of Birth: October 03, 1940   Medicare Important Message Given:  No    CORRECTION  NO IM GIVEN NOTE PLACED ON THE WRONG PATIENT   Senaya Dicenso [place] 04/19/2022, 3:24 PM

## 2022-04-19 NOTE — Anesthesia Procedure Notes (Signed)
Procedure Name: Intubation Date/Time: 04/19/2022 9:01 AM  Performed by: Gwyndolyn Saxon, CRNAPre-anesthesia Checklist: Patient identified, Emergency Drugs available, Suction available and Patient being monitored Patient Re-evaluated:Patient Re-evaluated prior to induction Oxygen Delivery Method: Circle system utilized Preoxygenation: Pre-oxygenation with 100% oxygen Induction Type: IV induction Ventilation: Mask ventilation without difficulty Laryngoscope Size: Miller and 2 Grade View: Grade I Tube type: Oral Tube size: 7.0 mm Number of attempts: 1 Airway Equipment and Method: Patient positioned with wedge pillow and Stylet Placement Confirmation: ETT inserted through vocal cords under direct vision, positive ETCO2 and breath sounds checked- equal and bilateral Secured at: 19 cm Tube secured with: Tape Dental Injury: Teeth and Oropharynx as per pre-operative assessment

## 2022-04-19 NOTE — Transfer of Care (Signed)
Immediate Anesthesia Transfer of Care Note  Patient: Tina Dominguez  Procedure(s) Performed: LAPAROSCOPIC CHOLECYSTECTOMY (Abdomen) LAPAROSCOPIC LYSIS OF ADHESIONS (Abdomen) INDOCYANINE GREEN FLUORESCENCE IMAGING (ICG) (Abdomen)  Patient Location: PACU  Anesthesia Type:General  Level of Consciousness: drowsy and patient cooperative  Airway & Oxygen Therapy: Patient Spontanous Breathing and Patient connected to nasal cannula oxygen  Post-op Assessment: Report given to RN and Post -op Vital signs reviewed and stable  Post vital signs: Reviewed and stable  Last Vitals:  Vitals Value Taken Time  BP 156/51 04/19/22 1058  Temp    Pulse 66 04/19/22 1101  Resp 17 04/19/22 1101  SpO2 90 % 04/19/22 1101  Vitals shown include unvalidated device data.  Last Pain:  Vitals:   04/19/22 0718  PainSc: 0-No pain         Complications: No notable events documented.

## 2022-04-20 ENCOUNTER — Encounter (HOSPITAL_COMMUNITY): Payer: Self-pay | Admitting: Surgery

## 2022-04-20 DIAGNOSIS — K8010 Calculus of gallbladder with chronic cholecystitis without obstruction: Secondary | ICD-10-CM | POA: Diagnosis not present

## 2022-04-20 LAB — SURGICAL PATHOLOGY

## 2022-04-20 NOTE — Plan of Care (Signed)
  Problem: Safety: Goal: Ability to remain free from injury will improve Outcome: Progressing   Problem: Pain Managment: Goal: General experience of comfort will improve Outcome: Progressing   

## 2022-04-20 NOTE — Progress Notes (Signed)
Discharge instructions given at this time with family at bedside.

## 2022-04-20 NOTE — Discharge Summary (Signed)
Physician Discharge Summary  Patient ID: Tina Dominguez MRN: [id number] DOB/AGE: 79-Oct-1942 79 y.o.  Admit date: 04/19/2022 Discharge date: 04/20/2022  Admission Diagnoses: Gallstones with history of chronic cholecystitis and possible gallstone pancreatitis  Discharge Diagnoses:  Principal Problem:   Gallstones   Discharged Condition: good  Hospital Course: Patient admitted after laparoscopic cholecystectomy.  She had an extensive lysis of adhesions.  She was observed observed overnight and had no nausea, vomiting or difficulty with liquids.  She was stable postop.  She was on oxygen but is on home oxygen but does not stay on that as she should.  I talked her about using home oxygen as directed by her primary care doctor at discharge.  Her pain was well controlled.  Her vital signs were stable.  Her wounds were clean dry and intact.  She was tolerating p.o. intake and was discharged home on postoperative day 1.  She will restart her anticoagulation tomorrow talked her about today and placed instructions on her discharge instructions.  She will resume regular diet.      Treatments: surgery: Laparoscopic cholecystectomy with extensive lysis of adhesions  Discharge Exam: Blood pressure (!) 142/50, pulse (!) 54, temperature 98.4 F (36.9 C), temperature source Oral, resp. rate 17, height '5\' 1"'$  (1.549 m), weight 71.2 kg, SpO2 96 %. General appearance: alert, cooperative, appears older than stated age, and fatigued Resp: clear to auscultation bilaterally Cardio: Normal sinus rhythm Incision/Wound: Incisions clean dry intact.  Abdomen soft and appropriately tender without rebound guarding or distention  Disposition: Discharge disposition: 01-Home or Self Care       Discharge Instructions     Diet - low sodium heart healthy   Complete by: As directed    Increase activity slowly   Complete by: As directed       Allergies as of 04/20/2022   No Known Allergies       Medication List     TAKE these medications    acetaminophen 500 MG tablet Commonly known as: TYLENOL Take 500-1,000 mg by mouth every 6 (six) hours as needed for moderate pain.   amiodarone 200 MG tablet Commonly known as: PACERONE TAKE 1 TABLET(200 MG) BY MOUTH DAILY   amLODipine 5 MG tablet Commonly known as: NORVASC Take 1 tablet (5 mg total) by mouth daily.   atorvastatin 40 MG tablet Commonly known as: LIPITOR TAKE 1 TABLET BY MOUTH EVERY DAY AT 6 PM   citalopram 10 MG tablet Commonly known as: CELEXA Take 10 mg by mouth at bedtime. Take with 40 mg to equal 50 mg at bedtime   citalopram 40 MG tablet Commonly known as: CELEXA Take 40 mg by mouth at bedtime. Take with 10 mg to equal 50 mg at bedtime   cloNIDine 0.1 MG tablet Commonly known as: CATAPRES Take 1 tablet (0.1 mg total) by mouth at bedtime. Take 1 extra tablet daily if your systolic BP (top number) is greater than 180 and if your heart rate is greater than 100.   famotidine 20 MG tablet Commonly known as: PEPCID Take 20 mg by mouth daily as needed for heartburn or indigestion.   furosemide 20 MG tablet Commonly known as: LASIX Take 20 mg by mouth daily.   levothyroxine 100 MCG tablet Commonly known as: SYNTHROID Take 100 mcg by mouth at bedtime.   linaclotide 290 MCG Caps capsule Commonly known as: LINZESS Take 290 mcg by mouth every other day.   metoprolol tartrate 25 MG tablet Commonly known as: LOPRESSOR Metoprolol  tartrate 12.5 mg (half tablet) in the morning daily. Hold for heart rate <100. What changed:  how much to take how to take this when to take this reasons to take this additional instructions   mirtazapine 30 MG tablet Commonly known as: REMERON Take 30 mg by mouth at bedtime.   multivitamin tablet Take 1 tablet by mouth daily.   Oxycodone HCl 10 MG Tabs Take 10 mg by mouth every 6 (six) hours as needed (pain).   potassium chloride SA 20 MEQ tablet Commonly known as:  KLOR-CON M Take 1 Tab (22mq) every other day with lasix What changed:  how much to take how to take this when to take this additional instructions   Xarelto 20 MG Tabs tablet Generic drug: rivaroxaban Take 20 mg by mouth daily.         Signed: TTurner DanielsMD  04/20/2022, 8:01 AM

## 2022-06-02 ENCOUNTER — Ambulatory Visit: Payer: PPO | Attending: Cardiology | Admitting: Cardiology

## 2022-06-02 ENCOUNTER — Encounter: Payer: Self-pay | Admitting: Cardiology

## 2022-06-02 VITALS — BP 178/70 | HR 58 | Ht 63.0 in | Wt 158.8 lb

## 2022-06-02 DIAGNOSIS — E782 Mixed hyperlipidemia: Secondary | ICD-10-CM

## 2022-06-02 DIAGNOSIS — Z79899 Other long term (current) drug therapy: Secondary | ICD-10-CM | POA: Diagnosis not present

## 2022-06-02 DIAGNOSIS — D6869 Other thrombophilia: Secondary | ICD-10-CM | POA: Diagnosis not present

## 2022-06-02 DIAGNOSIS — I480 Paroxysmal atrial fibrillation: Secondary | ICD-10-CM | POA: Diagnosis not present

## 2022-06-02 DIAGNOSIS — I110 Hypertensive heart disease with heart failure: Secondary | ICD-10-CM

## 2022-06-02 DIAGNOSIS — Z951 Presence of aortocoronary bypass graft: Secondary | ICD-10-CM

## 2022-06-02 DIAGNOSIS — Z7901 Long term (current) use of anticoagulants: Secondary | ICD-10-CM

## 2022-06-02 DIAGNOSIS — I25118 Atherosclerotic heart disease of native coronary artery with other forms of angina pectoris: Secondary | ICD-10-CM

## 2022-06-02 NOTE — Patient Instructions (Signed)
Medication Instructions:  Your physician recommends that you continue on your current medications as directed. Please refer to the Current Medication list given to you today.  *If you need a refill on your cardiac medications before your next appointment, please call your pharmacy*   Lab Work: Your physician recommends that you return for lab work in:   Labs today: CMP, TSH T3 T4, Lipids  If you have labs (blood work) drawn today and your tests are completely normal, you will receive your results only by: MyChart Message (if you have MyChart) OR A paper copy in the mail If you have any lab test that is abnormal or we need to change your treatment, we will call you to review the results.   Testing/Procedures: Your physician has requested that you have a carotid duplex. This test is an ultrasound of the carotid arteries in your neck. It looks at blood flow through these arteries that supply the brain with blood. Allow one hour for this exam. There are no restrictions or special instructions.    Follow-Up: At [facility], you and your health needs are our priority.  As part of our continuing mission to provide you with exceptional heart care, we have created designated Provider Care Teams.  These Care Teams include your primary Cardiologist (physician) and Advanced Practice Providers (APPs -  Physician Assistants and Nurse Practitioners) who all work together to provide you with the care you need, when you need it.  We recommend signing up for the patient portal called "MyChart".  Sign up information is provided on this After Visit Summary.  MyChart is used to connect with patients for Virtual Visits (Telemedicine).  Patients are able to view lab/test results, encounter notes, upcoming appointments, etc.  Non-urgent messages can be sent to your provider as well.   To learn more about what you can do with MyChart, go to [email or web address].    Your next appointment:   6  month(s)  The format for your next appointment:   In Person  Provider:   Shirlee More, MD    Other Instructions None  Important Information About Sugar

## 2022-06-02 NOTE — Progress Notes (Signed)
Cardiology Office Note:    Date:  06/02/2022   ID:  Tina Dominguez, DOB 1941-07-10, MRN [id number]  PCP:  Bonnita Nasuti, MD  Cardiologist:  Shirlee More, MD    Referring MD: Bonnita Nasuti, MD    ASSESSMENT:    1. PAF (paroxysmal atrial fibrillation) ([place])   2. On amiodarone therapy   3. Chronic anticoagulation   4. Acquired hypercoagulable state ([place])   5. Coronary artery disease of native artery of native heart with stable angina pectoris ([place])   6. S/P CABG x 3   7. Hypertensive heart disease with heart failure ([place])   8. Mixed hyperlipidemia    PLAN:    In order of problems listed above:  She is maintaining sinus rhythm on low-dose amiodarone without toxicity we will recheck labs including her thyroid liver function continue amiodarone and her current anticoagulant Stable CAD she has had no angina following remote bypass doing well on her current medical therapy and will continue her anticoagulant beta-blocker and lipid-lowering with a high intensity statin Continue her loop diuretic current antihypertensives and we will recheck her office blood pressure at rest. Continue current lipid-lowering treatment which has been effective and check lipid profile   Next appointment: 6 months   Medication Adjustments/Labs and Tests Ordered: Current medicines are reviewed at length with the patient today.  Concerns regarding medicines are outlined above.  No orders of the defined types were placed in this encounter.  No orders of the defined types were placed in this encounter.   Chief Complaint  Patient presents with   Follow-up   Atrial Fibrillation   Anticoagulation   Coronary Artery Disease    History of Present Illness:    Tina Dominguez is a 81 y.o. female with a hx of paroxysmal atrial fibrillation maintaining sinus rhythm on low-dose amiodarone with chronic anticoagulation, CAD with CABG in March 2020 hypertensive heart disease with heart failure hyperlipidemia  RCEA and hypercoagulable state with factor V Leiden mutation last seen 11/30/21.  She was previously seen by cardiology Dr Harrington Challenger as an inpatient 10/15/2021 for sinus bradycardia and left bundle branch block.   Compliance with diet, lifestyle and medications: Yes  She has had a busy time she has had both a cholecystectomy done for cholecystitis and gallstone pancreatitis and redo right total knee replacement. She had no cardiology complications uncomplicated course and feels as if she is recovered He has had no follow-up for her carotid disease we will do a duplex in my office She had no recent lab work since April She has had no angina chest pain edema shortness of breath palpitation or syncope She tolerates her statin without muscle pain or weakness  Recent labs PCP 01/18/2022: Cholesterol 126 LDL 39 HDL 64 triglyceride 134 hemoglobin 10.5 A1c 5.8 sodium 146 creatinine normal 0.99 her TSH free T3 and T4 were all normal potassium 4.2 and normal liver function test.   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[place]) 12/18/2017   Carotid artery occlusion    Carotid stenosis, bilateral 05/26/2011   CKD (chronic kidney disease) stage 3, GFR 30-59 ml/min (HCC) 12/13/2017   Clotting disorder ([place])    Coronary artery disease    Depression    Dysphagia 04/17/2017   Factor V Leiden mutation ([place]) 12/18/2017  Per patient, MD in [facility] said pt did not have Factor 5 but did have some kind of blood disorder-clots   GERD (gastroesophageal reflux disease) 05/26/2011   GI bleeding 10/04/2017   Gout 12/13/2017   Hiatal hernia 12/13/2017   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[place]) 04/17/2017   Reflux    Renal failure 12/13/2017   S/P IVC filter 12/13/2017   Schatzki's ring 12/13/2017   Sleep apnea    does not use cpap   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   Valvular heart disease 12/13/2017    Past Surgical History:  Procedure Laterality Date   ABDOMINAL HYSTERECTOMY  1980   TAH    APPENDECTOMY     BACK SURGERY  06/2011   BREAST SURGERY     x2   CAROTID ENDARTERECTOMY  11/11/2010   Right CEA   CHOLECYSTECTOMY N/A 04/19/2022   Procedure: LAPAROSCOPIC CHOLECYSTECTOMY;  Surgeon: Erroll Luna, MD;  Location: [place];  Service: General;  Laterality: N/A;   CORONARY ARTERY BYPASS GRAFT N/A 12/11/2018   Procedure: CORONARY ARTERY BYPASS GRAFTING (CABG) x three , using left internal mammary artery, and right leg greater saphenous vein harvested endoscopically - SVG to OM1, SVG to Distal RCA;  Surgeon: Grace Isaac, MD;  Location: [place];  Service: Open Heart Surgery;  Laterality: N/A;   IVC FILTER INSERTION     JOINT REPLACEMENT Right 2005   Right Total Knee   JOINT REPLACEMENT Left 2005   Left Total Knee   LAPAROSCOPIC LYSIS OF ADHESIONS N/A 04/19/2022   Procedure: LAPAROSCOPIC LYSIS OF ADHESIONS;  Surgeon: Erroll Luna, MD;  Location: [place];  Service: General;  Laterality: N/A;   LEFT HEART CATH AND CORONARY ANGIOGRAPHY N/A 12/10/2018   Procedure: LEFT HEART CATH AND CORONARY ANGIOGRAPHY;   Surgeon: Jettie Booze, MD;  Location: [place] [facility];  Service: Cardiovascular;  Laterality: N/A;   SHOULDER SURGERY     right   SPINE SURGERY  07/11/2011   Decomp. Laminectomy, fusion of L4-5,L5-S1 by Dr. Saintclair Halsted   TEE WITHOUT CARDIOVERSION N/A 12/11/2018   Procedure: TRANSESOPHAGEAL ECHOCARDIOGRAM (TEE);  Surgeon: Grace Isaac, MD;  Location: [place];  Service: Open Heart Surgery;  Laterality: N/A;   TOTAL KNEE ARTHROPLASTY     right  and left   TOTAL KNEE ARTHROPLASTY WITH REVISION COMPONENTS Right 12/01/2021   Procedure: TOTAL KNEE ARTHROPLASTY WITH REVISION COMPONENTS;  Surgeon: Willaim Sheng, MD;  Location: [facility];  Service: Orthopedics;  Laterality: Right;   WISDOM TOOTH EXTRACTION      Current Medications: Current Meds  Medication Sig   acetaminophen (TYLENOL) 500 MG tablet Take 500-1,000 mg by mouth every 6 (six) hours as needed for moderate pain.  amiodarone (PACERONE) 200 MG tablet TAKE 1 TABLET(200 MG) BY MOUTH DAILY   amLODipine (NORVASC) 5 MG tablet Take 1 tablet (5 mg total) by mouth daily.   atorvastatin (LIPITOR) 40 MG tablet TAKE 1 TABLET BY MOUTH EVERY DAY AT 6 PM   citalopram (CELEXA) 10 MG tablet Take 10 mg by mouth at bedtime. Take with 40 mg to equal 50 mg at bedtime   citalopram (CELEXA) 40 MG tablet Take 40 mg by mouth at bedtime. Take with 10 mg to equal 50 mg at bedtime   cloNIDine (CATAPRES) 0.1 MG tablet Take 1 tablet (0.1 mg total) by mouth at bedtime. Take 1 extra tablet daily if your systolic BP (top number) is greater than 180 and if your heart rate is greater than 100.   famotidine (PEPCID) 20 MG tablet Take 20 mg by mouth daily as needed for heartburn or indigestion.   furosemide (LASIX) 20 MG tablet Take 20 mg by mouth daily.   levothyroxine (SYNTHROID) 100 MCG tablet Take 100 mcg by mouth at bedtime.   linaclotide (LINZESS) 290 MCG CAPS capsule Take 290 mcg by mouth every other day.   metoprolol tartrate (LOPRESSOR) 25 MG tablet  Metoprolol tartrate 12.5 mg (half tablet) in the morning daily. Hold for heart rate <100. (Patient taking differently: Take 12.5 mg by mouth daily as needed (for heart rate <100.). Metoprolol tartrate 12.5 mg (half tablet) in the morning daily as needed for heart rate <100.)   mirtazapine (REMERON) 30 MG tablet Take 30 mg by mouth at bedtime.    Multiple Vitamin (MULTIVITAMIN) tablet Take 1 tablet by mouth daily.   Oxycodone HCl 10 MG TABS Take 10 mg by mouth every 6 (six) hours as needed (pain).   potassium chloride SA (KLOR-CON M) 20 MEQ tablet Take 20 mEq by mouth 2 (two) times daily.   XARELTO 20 MG TABS tablet Take 20 mg by mouth daily.     Allergies:   Patient has no known allergies.   Social History   Socioeconomic History   Marital status: Widowed    Spouse name: Not on file   Number of children: Not on file   Years of education: Not on file   Highest education level: Not on file  Occupational History   Not on file  Tobacco Use   Smoking status: Never   Smokeless tobacco: Never  Vaping Use   Vaping Use: Never used  Substance and Sexual Activity   Alcohol use: No   Drug use: No   Sexual activity: Not Currently    Birth control/protection: Surgical    Comment: Hysterectomy  Other Topics Concern   Not on file  Social History Narrative   Not on file   Social Determinants of Health   Financial Resource Strain: Not on file  Food Insecurity: Not on file  Transportation Needs: Not on file  Physical Activity: Not on file  Stress: Not on file  Social Connections: Not on file     Family History: The patient's family history includes Clotting disorder in her mother; Deep vein thrombosis in her mother and sister; Diabetes in her mother and sister; Heart disease in her brother and mother; Heart disease (age of onset: 49) in her father; Hyperlipidemia in her mother; Hypertension in her mother; Stroke in her maternal grandmother; Varicose Veins in her father. ROS:   Please  see the history of present illness.    All other systems reviewed and are negative.  EKGs/Labs/Other Studies Reviewed:  The following studies were reviewed today:  She had an echocardiogram performed 10/15/2021 as an inpatient [facility] EF 60 to 65% right ventricle mild right ventricular dysfunction normal size pulmonary pressure mildly elevated both atria are moderately enlarged and there is mild to moderate tricuspid regurgitation.  1. Left ventricular ejection fraction, by estimation, is 60 to 65%. The  left ventricle has normal function. The left ventricle has no regional  wall motion abnormalities. There is mild concentric left ventricular  hypertrophy. Left ventricular diastolic  parameters are consistent with Grade II diastolic dysfunction  (pseudonormalization).   2. Right ventricular systolic function is mildly reduced. The right  ventricular size is normal. There is mildly elevated pulmonary artery  systolic pressure.   3. Left atrial size was moderately dilated.   4. Right atrial size was moderately dilated.   5. The mitral valve is grossly normal. No evidence of mitral valve  regurgitation.   6. Tricuspid valve regurgitation is mild to moderate.   7. The aortic valve was not well visualized. Aortic valve regurgitation  is not visualized.   8. The inferior vena cava is normal in size with <50% respiratory  variability, suggesting right atrial pressure of 8 mmHg.  EKG:  EKG ordered today and personally reviewed.  The ekg ordered today demonstrates sinus bradycardia 58 bpm left axis deviation nonspecific conduction delay  Recent Labs: 10/16/2021: Magnesium 2.1 02/02/2022: ALT 13 04/19/2022: BUN 14; Creatinine, Ser 1.16; Hemoglobin 10.2; Platelets 287; Potassium 4.3; Sodium 138  Recent Lipid Panel    Component Value Date/Time   CHOL 118 10/14/2021 1131   CHOL 159 12/13/2019 1346   TRIG 96 10/14/2021 1131   HDL 58 10/14/2021 1131   HDL 69 12/13/2019 1346    CHOLHDL 2.0 10/14/2021 1131   VLDL 19 10/14/2021 1131   LDLCALC 41 10/14/2021 1131   LDLCALC 62 12/13/2019 1346    Physical Exam:    VS:  BP (!) 178/70 (BP Location: Right Arm, Patient Position: Sitting)   Pulse (!) 58   Ht '5\' 3"'$  (1.6 m)   Wt 158 lb 12.8 oz (72 kg)   SpO2 95%   BMI 28.13 kg/m     Wt Readings from Last 3 Encounters:  06/02/22 158 lb 12.8 oz (72 kg)  04/19/22 157 lb (71.2 kg)  12/01/21 158 lb 1.1 oz (71.7 kg)     GEN:  Well nourished, well developed in no acute distress HEENT: Normal NECK: No JVD; No carotid bruits LYMPHATICS: No lymphadenopathy CARDIAC: RRR, no murmurs, rubs, gallops RESPIRATORY:  Clear to auscultation without rales, wheezing or rhonchi  ABDOMEN: Soft, non-tender, non-distended MUSCULOSKELETAL:  No edema; No deformity  SKIN: Warm and dry NEUROLOGIC:  Alert and oriented x 3 PSYCHIATRIC:  Normal affect    Signed, Shirlee More, MD  06/02/2022 2:37 PM    [place]

## 2022-06-03 LAB — COMPREHENSIVE METABOLIC PANEL
ALT: 10 IU/L (ref 0–32)
AST: 20 IU/L (ref 0–40)
Albumin/Globulin Ratio: 1.4 (ref 1.2–2.2)
Albumin: 4 g/dL (ref 3.8–4.8)
Alkaline Phosphatase: 132 IU/L — ABNORMAL HIGH (ref 44–121)
BUN/Creatinine Ratio: 13 (ref 12–28)
BUN: 16 mg/dL (ref 8–27)
Bilirubin Total: 0.3 mg/dL (ref 0.0–1.2)
CO2: 23 mmol/L (ref 20–29)
Calcium: 8.7 mg/dL (ref 8.7–10.3)
Chloride: 104 mmol/L (ref 96–106)
Creatinine, Ser: 1.2 mg/dL — ABNORMAL HIGH (ref 0.57–1.00)
Globulin, Total: 2.9 g/dL (ref 1.5–4.5)
Glucose: 108 mg/dL — ABNORMAL HIGH (ref 70–99)
Potassium: 4.7 mmol/L (ref 3.5–5.2)
Sodium: 143 mmol/L (ref 134–144)
Total Protein: 6.9 g/dL (ref 6.0–8.5)
eGFR: 46 mL/min/{1.73_m2} — ABNORMAL LOW (ref >59)

## 2022-06-03 LAB — LIPID PANEL
Chol/HDL Ratio: 1.9 ratio (ref 0.0–4.4)
Cholesterol, Total: 140 mg/dL (ref 100–199)
HDL: 73 mg/dL (ref >39)
LDL Chol Calc (NIH): 48 mg/dL (ref 0–99)
Triglycerides: 110 mg/dL (ref 0–149)
VLDL Cholesterol Cal: 19 mg/dL (ref 5–40)

## 2022-06-03 LAB — TSH+T4F+T3FREE
Free T4: 1.78 ng/dL — ABNORMAL HIGH (ref 0.82–1.77)
T3, Free: 2 pg/mL (ref 2.0–4.4)
TSH: 1.91 u[IU]/mL (ref 0.450–4.500)

## 2022-06-15 ENCOUNTER — Ambulatory Visit: Payer: PPO | Attending: Cardiology

## 2022-06-15 DIAGNOSIS — I480 Paroxysmal atrial fibrillation: Secondary | ICD-10-CM

## 2022-06-15 DIAGNOSIS — I110 Hypertensive heart disease with heart failure: Secondary | ICD-10-CM

## 2022-06-15 DIAGNOSIS — Z7901 Long term (current) use of anticoagulants: Secondary | ICD-10-CM

## 2022-06-15 DIAGNOSIS — I25118 Atherosclerotic heart disease of native coronary artery with other forms of angina pectoris: Secondary | ICD-10-CM

## 2022-06-15 DIAGNOSIS — E782 Mixed hyperlipidemia: Secondary | ICD-10-CM

## 2022-06-15 DIAGNOSIS — D6869 Other thrombophilia: Secondary | ICD-10-CM

## 2022-06-15 DIAGNOSIS — I6523 Occlusion and stenosis of bilateral carotid arteries: Secondary | ICD-10-CM

## 2022-06-15 DIAGNOSIS — Z951 Presence of aortocoronary bypass graft: Secondary | ICD-10-CM

## 2022-06-15 DIAGNOSIS — Z79899 Other long term (current) drug therapy: Secondary | ICD-10-CM

## 2022-07-18 ENCOUNTER — Telehealth: Payer: Self-pay

## 2022-07-18 NOTE — Telephone Encounter (Signed)
-----   Message from Truddie Hidden, RN sent at 06/17/2022  7:42 AM EDT -----  ----- Message ----- From: Richardo Priest, MD Sent: 06/16/2022   5:18 PM EDT To: Serafina Mitchell, MD; Rebec[facility] Alert [facility] Triage  She has focal stenosis proximal right coronary artery following carotid endarterectomy  She should be seen by vascular surgery and follow-up CEA with occurred 2012

## 2022-08-01 ENCOUNTER — Telehealth: Payer: Self-pay | Admitting: Cardiology

## 2022-08-01 DIAGNOSIS — Z9889 Other specified postprocedural states: Secondary | ICD-10-CM

## 2022-08-01 NOTE — Telephone Encounter (Signed)
Referral has been placed. Left a detailed message that referral has been placed.

## 2022-08-01 NOTE — Telephone Encounter (Signed)
Patient's son is calling stating the patient needs a referral back to the vascular surgeon Dr. Trula Slade due to it being over 3 years since the last time she was seen. He is requesting the patient be called back once the referral has been sent so they know to call and schedule as Dr. Bettina Gavia recommended due to him being at a convention for the next few days. Please advise.

## 2022-08-22 ENCOUNTER — Encounter: Payer: Self-pay | Admitting: Surgery

## 2022-08-22 ENCOUNTER — Ambulatory Visit: Payer: PPO | Admitting: Surgery

## 2022-08-22 VITALS — BP 181/74 | HR 58 | Temp 97.9°F | Resp 20 | Ht 63.0 in | Wt 161.5 lb

## 2022-08-22 DIAGNOSIS — I6523 Occlusion and stenosis of bilateral carotid arteries: Secondary | ICD-10-CM | POA: Diagnosis not present

## 2022-08-22 NOTE — Progress Notes (Signed)
[facility] [place]  Patient name: Tina Dominguez MRN: [id number] DOB: [birth year] Sex: female   REQUESTING PROVIDER:    Dr. Bettina Gavia   REASON FOR CONSULT:    carotid  HISTORY OF PRESENT ILLNESS:   Tina Dominguez is a 81 y.o. female, who is returning for evaluation of her carotid disease.  She has a history of a right carotid endarterectomy performed in 2012 for symptomatic stenosis.  At that time, she had a preoperative right brain stroke.  Her only residual symptoms were numbness in her left fifth finger.  I last saw her in 2019.  She was having generalized neurologic symptoms which I did not feel were related to her carotid disease.  Since I last saw her, she has had a CABG, a third knee replacement, and a cholecystectomy.  Today she denies any neurologic symptoms such as numbness or weakness in either extremity, slurred speech, or amaurosis fugax.   Is a non-smoker.  She is on a statin for hypercholesterolemia..  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  She does have a [place] filter in place.  She has a history of PE as well as DVT.  She is anticoagulated with Xarelto.  She is medically managed for hypertension.    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/17/2017   Reflux    Renal failure 12/13/2017   S/P IVC filter 12/13/2017   Schatzki's ring 12/13/2017   Sleep apnea    does not use cpap   Stroke [facility])    X's 4   Valvular heart disease 12/13/2017     FAMILY HISTORY   Family History  Problem Relation Age of Onset   Clotting disorder Mother    Deep vein thrombosis  Mother    Diabetes Mother    Heart disease Mother    Hyperlipidemia Mother    Hypertension Mother    Heart disease Father 28   Varicose Veins Father    Stroke Maternal Grandmother    Deep vein thrombosis Sister    Diabetes Sister    Heart disease Brother     SOCIAL HISTORY:   Social History   Socioeconomic History   Marital status: Widowed    Spouse name: Not on file   Number of children: Not on file   Years of education: Not on file   Highest education level: Not on file  Occupational History   Not on file  Tobacco Use   Smoking status: Never   Smokeless tobacco: Never  Vaping Use   Vaping Use: Never used  Substance and  Sexual Activity   Alcohol use: No   Drug use: No   Sexual activity: Not Currently    Birth control/protection: Surgical    Comment: Hysterectomy  Other Topics Concern   Not on file  Social History Narrative   Not on file   Social Determinants of Health   Financial Resource Strain: Not on file  Food Insecurity: Not on file  Transportation Needs: Not on file  Physical Activity: Not on file  Stress: Not on file  Social Connections: Not on file  Intimate Partner Violence: Not on file    ALLERGIES:    No Known Allergies  CURRENT MEDICATIONS:    Current Outpatient Medications  Medication Sig Dispense Refill   acetaminophen (TYLENOL) 500 MG tablet Take 500-1,000 mg by mouth every 6 (six) hours as needed for moderate pain.      amiodarone (PACERONE) 200 MG tablet TAKE 1 TABLET(200 MG) BY MOUTH DAILY 90 tablet 3   amLODipine (NORVASC) 5 MG tablet Take 1 tablet (5 mg total) by mouth daily. 90 tablet 3   atorvastatin (LIPITOR) 40 MG tablet TAKE 1 TABLET BY MOUTH EVERY DAY AT 6 PM 90 tablet 3   citalopram (CELEXA) 10 MG tablet Take 10 mg by mouth at bedtime. Take with 40 mg to equal 50 mg at bedtime     citalopram (CELEXA) 40 MG tablet Take 40 mg by mouth at bedtime. Take with 10 mg to equal 50 mg at bedtime     cloNIDine (CATAPRES) 0.1 MG tablet Take 1 tablet (0.1 mg total) by mouth at bedtime. Take 1 extra tablet daily if your systolic BP (top number) is greater than 180 and if your heart rate is greater than 100. 120 tablet 3   famotidine (PEPCID) 20 MG tablet Take 20 mg by mouth daily as needed for heartburn or indigestion.     furosemide (LASIX) 20 MG tablet Take 20 mg by mouth daily.     levothyroxine (SYNTHROID) 100 MCG tablet Take 100 mcg by mouth at bedtime.     linaclotide (LINZESS) 290 MCG CAPS capsule Take 290 mcg by mouth every other day.     metoprolol tartrate (LOPRESSOR) 25 MG tablet Metoprolol tartrate 12.5 mg (half tablet) in the morning daily. Hold for heart rate  <100. (Patient taking differently: Take 12.5 mg by mouth daily as needed (for heart rate <100.). Metoprolol tartrate 12.5 mg (half tablet) in the morning daily as needed for heart rate <100.) 30 tablet 2   mirtazapine (REMERON) 30 MG tablet Take 30 mg by mouth at bedtime.      Multiple Vitamin (MULTIVITAMIN) tablet Take 1 tablet by mouth daily.  naloxone (NARCAN) nasal spray 4 mg/0.1 mL as directed Nasally daily as needed for opioid over dose for 30 days     Oxycodone HCl 10 MG TABS Take 10 mg by mouth every 6 (six) hours as needed (pain).     potassium chloride SA (KLOR-CON M) 20 MEQ tablet Take 20 mEq by mouth 2 (two) times daily.     XARELTO 20 MG TABS tablet Take 20 mg by mouth daily.  0   No current facility-administered medications for this visit.    REVIEW OF SYSTEMS:   '[X]'$  denotes positive finding, '[ ]'$  denotes negative finding Cardiac  Comments:  Chest pain or chest pressure:    Shortness of breath upon exertion:    Short of breath when lying flat:    Irregular heart rhythm:        Vascular    Pain in calf, thigh, or hip brought on by ambulation:    Pain in feet at night that wakes you up from your sleep:     Blood clot in your veins:    Leg swelling:         Pulmonary    Oxygen at home:    Productive cough:     Wheezing:         Neurologic    Sudden weakness in arms or legs:     Sudden numbness in arms or legs:     Sudden onset of difficulty speaking or slurred speech:    Temporary loss of vision in one eye:     Problems with dizziness:         Gastrointestinal    Blood in stool:      Vomited blood:         Genitourinary    Burning when urinating:     Blood in urine:        Psychiatric    Major depression:         Hematologic    Bleeding problems:    Problems with blood clotting too easily:        Skin    Rashes or ulcers:        Constitutional    Fever or chills:     PHYSICAL EXAM:   Vitals:   08/22/22 0839  BP: (!) 181/74  Pulse: (!) 58   Resp: 20  Temp: 97.9 F (36.6 C)  SpO2: 95%  Weight: 161 lb 8 oz (73.3 kg)  Height: '5\' 3"'$  (1.6 m)    GENERAL: The patient is a well-nourished female, in no acute distress. The vital signs are documented above. CARDIAC: There is a regular rate and rhythm.  VASCULAR: Palpable pedal pulses PULMONARY: Nonlabored respirations ABDOMEN: Soft and non-tender with normal pitched bowel sounds.  MUSCULOSKELETAL: There are no major deformities or cyanosis. NEUROLOGIC: No focal weakness or paresthesias are detected. SKIN: There are no ulcers or rashes noted. PSYCHIATRIC: The patient has a normal affect.  STUDIES:   I reviewed her ultrasound with the following findings:  Right Carotid: Upper limits of 50-75% range, restenosis post carotic                 endarterectomy.   Left Carotid: Velocities in the left ICA are consistent with a 40-59%  stenosis.   Vertebrals: Bilateral vertebral arteries demonstrate antegrade flow.  Subclavians: Left subclavian artery flow was disturbed. Normal flow  hemodynamics              were seen in the right subclavian artery.  ASSESSMENT and PLAN   Recurrent right carotid stenosis: Most recent ultrasound shows 50-75% stenosis, towards the upper end of the range which is consistent with her prior ultrasounds.  This does appear to be stable over several years.  She is asymptomatic.  She is here today with her son.  We discussed the signs and symptoms of stroke and to call 911 should these occur.  I told her I would plan on intervention if she develops symptoms, or if her stenosis becomes greater than 80%.  She will follow-up with me with a repeat ultrasound in 6 months   Leia Alf, MD, FACS [facility] [phone] Pager [phone]

## 2022-08-24 ENCOUNTER — Other Ambulatory Visit: Payer: Self-pay

## 2022-08-24 DIAGNOSIS — I6523 Occlusion and stenosis of bilateral carotid arteries: Secondary | ICD-10-CM

## 2022-11-30 NOTE — Progress Notes (Unsigned)
Cardiology Office Note:    Date:  12/01/2022   ID:  Tina Dominguez, DOB 1941/09/17, MRN [id number] 1 cardiology perspective [place] continues to do well maintaining sinus rhythm on low-dose amiodarone without signs of toxicity  PCP:  Bonnita Nasuti, MD  Cardiologist:  Shirlee More, MD    Referring MD: Bonnita Nasuti, MD    ASSESSMENT:    1. PAF (paroxysmal atrial fibrillation) ([place])   2. On amiodarone therapy   3. Chronic anticoagulation   4. Coronary artery disease of native artery of native heart with stable angina pectoris ([place])   5. S/P CABG x 3   6. Hypertensive heart disease with heart failure ([place])   7. Mixed hyperlipidemia    PLAN:    In order of problems listed above:  She will continue at 200 mg daily along with minimal dose of beta-blocker Lopressor 12 and half milligrams as needed for tachycardia.  She is anticoagulated with Xarelto. Stable CAD no anginal discomfort on good medical therapy including lipid-lowering with statin Stable CAD continue medical therapy including lipid-lowering with statin Well compensated continue her current loop diuretic and antihypertensive calcium channel blocker Continue her statin to see if we can access her recent lipid profile unavailable in epic   Next appointment: 9 months   Medication Adjustments/Labs and Tests Ordered: Current medicines are reviewed at length with the patient today.  Concerns regarding medicines are outlined above.  No orders of the defined types were placed in this encounter.  No orders of the defined types were placed in this encounter.   Chief complaint follow-up for atrial fibrillation   History of Present Illness:    Tina Dominguez is a 82 y.o. female with a hx of paroxysmal atrial fibrillation maintaining sinus rhythm on low-dose amiodarone and chronic anticoagulation CAD with CABG March 2020 hypertensive heart disease with heart failure hyperlipidemia carotid disease a right carotid  endarterectomy hypercoagulable disorder with factor V Leiden mutation and left bundle branch block last seen 06/02/2022.  Compliance with diet, lifestyle and medications: Yes  Unfortunately she has had recurrent pancreatitis will be seeing a specialist at [facility] She has recovered She is maintaining sinus rhythm and is not having cardiovascular symptoms of edema shortness of breath chest pain palpitation or syncope She was told by her PCP she had a heart murmur I reviewed her records and she had an echocardiogram performed January 2023 she had mild concentric LVH EF 60 to 65% mild right ventricular dysfunction PA systolic mildly elevated both atria are moderately enlarged and there is no aortic stenosis or regurgitation .  The recent labs at her PCP office I requested not available in epic 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[place]) 12/18/2017   Carotid artery occlusion    Carotid stenosis, bilateral 05/26/2011   CKD (chronic kidney disease) stage 3, GFR 30-59 ml/min (HCC) 12/13/2017   Clotting disorder ([place])    Coronary artery disease    Depression    Dysphagia 04/17/2017   Factor V Leiden mutation ([place]) 12/18/2017   Per patient, MD in [place] said pt did not have Factor 5 but did have some kind of blood disorder-clots   GERD (gastroesophageal reflux disease) 05/26/2011   GI bleeding 10/04/2017   Gout 12/13/2017   Hiatal hernia  12/13/2017   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    History of kidney stones    History of recurrent TIAs 12/13/2017   History of right-sided carotid endarterectomy 10/10/2011   Hyperlipidemia    Hypertension    Hypertensive heart disease  with heart failure ([place]) 01/29/2019   Hypothyroidism    Leg pain    with walking   Myocardial infarction [facility])    Occlusion and stenosis of carotid artery without mention of cerebral infarction 10/08/2012   Osteoarthritis 05/26/2011   Osteoporosis    Pancreatitis    PAT (paroxysmal atrial tachycardia) 12/05/2018   PE (pulmonary embolism) 05/26/2011   Pneumonia    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[place]) 04/17/2017   Reflux    Renal failure 12/13/2017   S/P IVC filter 12/13/2017   Schatzki's ring 12/13/2017   Sleep apnea    does not use cpap   Stroke ([place])    X's 4   Valvular heart disease 12/13/2017    Past Surgical History:  Procedure Laterality Date   ABDOMINAL HYSTERECTOMY  1980   TAH    APPENDECTOMY     BACK SURGERY  06/2011   BREAST SURGERY     x2   CAROTID ENDARTERECTOMY  11/11/2010   Right CEA   CHOLECYSTECTOMY N/A 04/19/2022   Procedure: LAPAROSCOPIC CHOLECYSTECTOMY;  Surgeon: Erroll Luna, MD;  Location: [place];  Service: General;  Laterality: N/A;   CORONARY ARTERY BYPASS GRAFT N/A 12/11/2018   Procedure: CORONARY ARTERY BYPASS GRAFTING (CABG) x three , using left internal mammary artery, and right leg greater saphenous vein harvested endoscopically - SVG to OM1, SVG to Distal RCA;  Surgeon: Grace Isaac, MD;  Location: [place];  Service: Open Heart Surgery;  Laterality: N/A;   IVC FILTER INSERTION     JOINT REPLACEMENT Right 2005   Right Total Knee   JOINT REPLACEMENT Left 2005   Left Total Knee   LAPAROSCOPIC LYSIS OF ADHESIONS N/A 04/19/2022   Procedure: LAPAROSCOPIC LYSIS OF ADHESIONS;  Surgeon: Erroll Luna, MD;  Location: [place];  Service: General;  Laterality: N/A;   LEFT HEART CATH AND CORONARY ANGIOGRAPHY N/A 12/10/2018   Procedure: LEFT HEART CATH AND CORONARY ANGIOGRAPHY;  Surgeon: Jettie Booze, MD;  Location: [place] [facility];  Service: Cardiovascular;  Laterality: N/A;   SHOULDER SURGERY     right   SPINE SURGERY  07/11/2011   Decomp.  Laminectomy, fusion of L4-5,L5-S1 by Dr. Saintclair Halsted   TEE WITHOUT CARDIOVERSION N/A 12/11/2018   Procedure: TRANSESOPHAGEAL ECHOCARDIOGRAM (TEE);  Surgeon: Grace Isaac, MD;  Location: [place];  Service: Open Heart Surgery;  Laterality: N/A;   TOTAL KNEE ARTHROPLASTY     right  and left   TOTAL KNEE ARTHROPLASTY WITH REVISION COMPONENTS Right 12/01/2021   Procedure: TOTAL KNEE ARTHROPLASTY WITH REVISION COMPONENTS;  Surgeon: Willaim Sheng, MD;  Location: [facility];  Service: Orthopedics;  Laterality: Right;   WISDOM TOOTH EXTRACTION      Current Medications: Current Meds  Medication Sig   acetaminophen (TYLENOL) 500 MG tablet Take 500-1,000 mg by mouth every 6 (six) hours as needed for moderate pain.    amiodarone (PACERONE) 200 MG tablet TAKE 1 TABLET(200 MG) BY MOUTH DAILY   amLODipine (NORVASC) 10 MG tablet Take 10 mg by mouth daily.   atorvastatin (LIPITOR) 40 MG tablet TAKE 1 TABLET BY MOUTH EVERY DAY AT  6 PM   citalopram (CELEXA) 10 MG tablet Take 10 mg by mouth at bedtime. Take with 40 mg to equal 50 mg at bedtime   citalopram (CELEXA) 40 MG tablet Take 40 mg by mouth at bedtime. Take with 10 mg to equal 50 mg at bedtime   cloNIDine (CATAPRES) 0.1 MG tablet Take 1 tablet (0.1 mg total) by mouth at bedtime. Take 1 extra tablet daily if your systolic BP (top number) is greater than 180 and if your heart rate is greater than 100.   famotidine (PEPCID) 20 MG tablet Take 20 mg by mouth daily as needed for heartburn or indigestion.   furosemide (LASIX) 20 MG tablet Take 20 mg by mouth daily.   levothyroxine (SYNTHROID) 100 MCG tablet Take 100 mcg by mouth at bedtime.   linaclotide (LINZESS) 290 MCG CAPS capsule Take 290 mcg by mouth every other day.   metoprolol tartrate (LOPRESSOR) 25 MG tablet Metoprolol tartrate 12.5 mg (half tablet) in the morning daily. Hold for heart rate <100. (Patient taking differently: Take 12.5 mg by mouth daily as needed (Heart rate >100). Metoprolol tartrate  12.5 mg (half tablet) in the morning daily as needed for heart >100)   mirtazapine (REMERON) 30 MG tablet Take 30 mg by mouth at bedtime.    Multiple Vitamin (MULTIVITAMIN) tablet Take 1 tablet by mouth daily.   naloxone (NARCAN) nasal spray 4 mg/0.1 mL as directed Nasally daily as needed for opioid over dose for 30 days   Oxycodone HCl 10 MG TABS Take 10 mg by mouth every 6 (six) hours as needed (pain).   potassium chloride SA (KLOR-CON M) 20 MEQ tablet Take 20 mEq by mouth 2 (two) times daily.   XARELTO 20 MG TABS tablet Take 20 mg by mouth daily.     Allergies:   Patient has no known allergies.   Social History   Socioeconomic History   Marital status: Widowed    Spouse name: Not on file   Number of children: Not on file   Years of education: Not on file   Highest education level: Not on file  Occupational History   Not on file  Tobacco Use   Smoking status: Never   Smokeless tobacco: Never  Vaping Use   Vaping Use: Never used  Substance and Sexual Activity   Alcohol use: No   Drug use: No   Sexual activity: Not Currently    Birth control/protection: Surgical    Comment: Hysterectomy  Other Topics Concern   Not on file  Social History Narrative   Not on file   Social Determinants of Health   Financial Resource Strain: Not on file  Food Insecurity: Not on file  Transportation Needs: Not on file  Physical Activity: Not on file  Stress: Not on file  Social Connections: Not on file     Family History: The patient's family history includes Clotting disorder in her mother; Deep vein thrombosis in her mother and sister; Diabetes in her mother and sister; Heart disease in her brother and mother; Heart disease (age of onset: 2) in her father; Hyperlipidemia in her mother; Hypertension in her mother; Stroke in her maternal grandmother; Varicose Veins in her father. ROS:   Please see the history of present illness.    All other systems reviewed and are  negative.  EKGs/Labs/Other Studies Reviewed:    The following studies were reviewed today:  Cardiac Studies & Procedures   CARDIAC CATHETERIZATION  CARDIAC CATHETERIZATION 12/10/2018  Narrative  Ost LM to Mid LM lesion is 60% stenosed.  Ost RCA to Prox RCA lesion is 80% stenosed.  Mid RCA lesion is 99% stenosed.  Prox LAD lesion is 90% stenosed.  There is no aortic valve stenosis.  The left ventricular systolic function is normal.  LV end diastolic pressure is normal.  The left ventricular ejection fraction is 55-65% by visual estimate.  Tortuosity in the right subclavian made catheter engagement difficult.  Would not use right radial approach.  Severe multivessel, calcific CAD.  Patient with rest pain at home.  Only received 1 dose of PLavix, 75 mg this AM since she was off of her Xarelto.  THis is not a chronic medicine for her so she would not have therapeutic antiplatelet inhibition.  Start IV heparin.  Cardiac surgery consult.  Findings Coronary Findings Diagnostic  Dominance: Right  Left Main Ost LM to Mid LM lesion is 60% stenosed. The lesion is eccentric. The lesion is calcified.  Left Anterior Descending Prox LAD lesion is 90% stenosed. The lesion is severely calcified.  Right Coronary Artery Ost RCA to Prox RCA lesion is 80% stenosed. Mid RCA lesion is 99% stenosed.  with left-to-right collateral flow. The lesion is severely calcified.  Intervention  No interventions have been documented.   STRESS TESTS  MYOCARDIAL PERFUSION IMAGING 12/04/2018  Narrative  Nuclear stress EF: 58%.  There was no ST segment deviation noted during stress.  Defect 1: There is a medium defect of moderate severity present in the basal inferior, mid inferior and apical inferior location.  Findings consistent with ischemia.  This is an intermediate risk study.  The left ventricular ejection fraction is normal (55-65%).  Significant breast attenuation suggested on  this study. This makes that study fair quality.   ECHOCARDIOGRAM  ECHOCARDIOGRAM COMPLETE 10/15/2021  Narrative ECHOCARDIOGRAM REPORT    Patient Name:   Tina Dominguez Date of Exam: 10/15/2021 Medical Rec #:  [id number]        Height:       63.0 in Accession #:    [id number]       Weight:       160.0 lb Date of Birth:  28-May-1941       BSA:          1.759 m Patient Age:    68 years         BP:           130/52 mmHg Patient Gender: F                HR:           50 bpm. Exam Location:  Inpatient  Procedure: 2D Echo  Indications:    Heart block  History:        Patient has no prior history of Echocardiogram examinations. Risk Factors:Hypertension.  Sonographer:    Jefferey Pica Referring Phys: [id number] [place]   1. Left ventricular ejection fraction, by estimation, is 60 to 65%. The left ventricle has normal function. The left ventricle has no regional wall motion abnormalities. There is mild concentric left ventricular hypertrophy. Left ventricular diastolic parameters are consistent with Grade II diastolic dysfunction (pseudonormalization). 2. Right ventricular systolic function is mildly reduced. The right ventricular size is normal. There is mildly elevated pulmonary artery systolic pressure. 3. Left atrial size was moderately dilated. 4. Right atrial size was moderately dilated. 5. The mitral valve is grossly normal. No evidence of mitral valve regurgitation. 6. Tricuspid valve regurgitation is  mild to moderate. 7. The aortic valve was not well visualized. Aortic valve regurgitation is not visualized. 8. The inferior vena cava is normal in size with <50% respiratory variability, suggesting right atrial pressure of 8 mmHg.  Comparison(s): No prior Echocardiogram.  FINDINGS Left Ventricle: Left ventricular ejection fraction, by estimation, is 60 to 65%. The left ventricle has normal function. The left ventricle has no regional wall motion abnormalities.  The left ventricular internal cavity size was normal in size. There is mild concentric left ventricular hypertrophy. Left ventricular diastolic parameters are consistent with Grade II diastolic dysfunction (pseudonormalization).  Right Ventricle: The right ventricular size is normal. No increase in right ventricular wall thickness. Right ventricular systolic function is mildly reduced. There is mildly elevated pulmonary artery systolic pressure. The tricuspid regurgitant velocity is 2.88 m/s, and with an assumed right atrial pressure of 8 mmHg, the estimated right ventricular systolic pressure is [id number] mmHg.  Left Atrium: Left atrial size was moderately dilated.  Right Atrium: Right atrial size was moderately dilated.  Pericardium: There is no evidence of pericardial effusion.  Mitral Valve: The mitral valve is grossly normal. There is mild calcification of the mitral valve leaflet(s). No evidence of mitral valve regurgitation.  Tricuspid Valve: The tricuspid valve is not well visualized. Tricuspid valve regurgitation is mild to moderate.  Aortic Valve: The aortic valve was not well visualized. Aortic valve regurgitation is not visualized. Aortic valve peak gradient measures 9.6 mmHg.  Pulmonic Valve: The pulmonic valve was not well visualized. Pulmonic valve regurgitation is not visualized.  Aorta: The aortic root and ascending aorta are structurally normal, with no evidence of dilitation.  Venous: The inferior vena cava is normal in size with less than 50% respiratory variability, suggesting right atrial pressure of 8 mmHg.  IAS/Shunts: No atrial level shunt detected by color flow Doppler.   LEFT VENTRICLE PLAX 2D LVIDd:         4.20 cm   Diastology LVIDs:         3.00 cm   LV e' medial:    6.80 cm/s LV PW:         1.20 cm   LV E/e' medial:  16.9 LV IVS:        1.20 cm   LV e' lateral:   10.70 cm/s LVOT diam:     1.90 cm   LV E/e' lateral: 10.7 LV SV:         82 LV SV Index:    47 LVOT Area:     2.84 cm   RIGHT VENTRICLE          IVC RV Basal diam:  3.00 cm  IVC diam: 1.80 cm TAPSE (M-mode): 1.5 cm  LEFT ATRIUM             Index        RIGHT ATRIUM           Index LA diam:        4.30 cm 2.44 cm/m   RA Area:     22.90 cm LA Vol (A2C):   80.5 ml 45.77 ml/m  RA Volume:   69.70 ml  39.63 ml/m LA Vol (A4C):   71.6 ml 40.71 ml/m LA Biplane Vol: 77.4 ml 44.01 ml/m AORTIC VALVE                 PULMONIC VALVE AV Area (Vmax): 2.38 cm     PV Vmax:       0.97 m/s AV Vmax:  155.00 cm/s  PV Peak grad:  3.7 mmHg AV Peak Grad:   9.6 mmHg LVOT Vmax:      130.00 cm/s LVOT Vmean:     78.000 cm/s LVOT VTI:       0.290 m  AORTA Ao Root diam: 3.10 cm Ao Asc diam:  2.80 cm  MITRAL VALVE                TRICUSPID VALVE MV Area (PHT): 3.93 cm     TR Peak grad:   33.2 mmHg MV Decel Time: 193 msec     TR Vmax:        288.00 cm/s MV E velocity: 115.00 cm/s MV A velocity: 83.20 cm/s   SHUNTS MV E/A ratio:  1.38         Systemic VTI:  0.29 m Systemic Diam: 1.90 cm  Phineas Inches Electronically signed by Phineas Inches Signature Date/Time: 10/15/2021/3:30:33 PM    Final   TEE  ECHO INTRAOPERATIVE TEE 01/01/2019  Interpretation Summary   Left ventricle: Normal cavity size. Concentric hypertrophy of mild severity.   Aortic valve: Mild valve thickening present. Mild valve calcification present. No stenosis.   Mitral valve:  Mild regurgitation.   Right ventricle:  Normal wall thickness and ejection fraction. There is a prominent moderator band in the RV cavity.   Tricuspid valve: Mild regurgitation. The tricuspid valve regurgitation jet is central.   MONITORS  LONG TERM MONITOR (3-14 DAYS) 08/13/2019  Narrative A ZIO monitor was performed for 14 days beginning 07/17/2019 to assess for atrial fibrillation.  The rhythm throughout was sinus with minimum average and maximum heart rates of 31, 47 and 127 bpm.  There were no pauses of 3 seconds or greater.  There  is nocturnal sinus bradycardia with rates down to 31 bpm.  Associated with it was brief accelerated idioventricular rhythm at the same rate.  There are no episodes of second-degree third-degree AV block or sinus node exit block.  Ventricular ectopy was rare with isolated PVCs  Supraventricular ectopy was rare with isolated APCs and one 4 beat run of atrial premature contractions.  There were no episodes of atrial fibrillation or flutter.  There were 9 triggered and 4 diary events all associated with sinus rhythm and sinus tachycardia.   Conclusion 1 absence of atrial fibrillation 2 sinus bradycardia with brief accelerated idioventricular rhythm.           EKG:  EKG ordered today and personally reviewed.  The ekg ordered today demonstrates sinus rhythm she has a single PVC present otherwise normal including QT interval  Recent Labs: 04/19/2022: Hemoglobin 10.2; Platelets 287 06/02/2022: ALT 10; BUN 16; Creatinine, Ser 1.20; Potassium 4.7; Sodium 143; TSH 1.910  Recent Lipid Panel    Component Value Date/Time   CHOL 140 06/02/2022 1449   TRIG 110 06/02/2022 1449   HDL 73 06/02/2022 1449   CHOLHDL 1.9 06/02/2022 1449   CHOLHDL 2.0 10/14/2021 1131   VLDL 19 10/14/2021 1131   LDLCALC 48 06/02/2022 1449    Physical Exam:    VS:  BP (!) 144/58 (BP Location: Right Arm, Patient Position: Sitting)   Pulse 61   Ht '5\' 3"'$  (1.6 m)   Wt 166 lb 12.8 oz (75.7 kg)   SpO2 98%   BMI 29.55 kg/m     Wt Readings from Last 3 Encounters:  12/01/22 166 lb 12.8 oz (75.7 kg)  08/22/22 161 lb 8 oz (73.3 kg)  06/02/22 158 lb 12.8 oz (72 kg)  GEN:  Well nourished, well developed in no acute distress HEENT: Normal NECK: No JVD; No carotid bruits LYMPHATICS: No lymphadenopathy CARDIAC: RRR, no murmurs, rubs, gallops RESPIRATORY:  Clear to auscultation without rales, wheezing or rhonchi  ABDOMEN: Soft, non-tender, non-distended grade 1/6 systolic ejection murmur localized aortic  area MUSCULOSKELETAL:  No edema; No deformity  SKIN: Warm and dry NEUROLOGIC:  Alert and oriented x 3 PSYCHIATRIC:  Normal affect    Signed, Shirlee More, MD  12/01/2022 2:28 PM    [facility]

## 2022-12-01 ENCOUNTER — Encounter: Payer: Self-pay | Admitting: Cardiology

## 2022-12-01 ENCOUNTER — Other Ambulatory Visit: Payer: Self-pay

## 2022-12-01 ENCOUNTER — Ambulatory Visit: Payer: PPO | Attending: Cardiology | Admitting: Cardiology

## 2022-12-01 VITALS — BP 144/58 | HR 61 | Ht 63.0 in | Wt 166.8 lb

## 2022-12-01 DIAGNOSIS — I480 Paroxysmal atrial fibrillation: Secondary | ICD-10-CM

## 2022-12-01 DIAGNOSIS — Z79899 Other long term (current) drug therapy: Secondary | ICD-10-CM

## 2022-12-01 DIAGNOSIS — Z7901 Long term (current) use of anticoagulants: Secondary | ICD-10-CM | POA: Diagnosis not present

## 2022-12-01 DIAGNOSIS — I110 Hypertensive heart disease with heart failure: Secondary | ICD-10-CM

## 2022-12-01 DIAGNOSIS — I25118 Atherosclerotic heart disease of native coronary artery with other forms of angina pectoris: Secondary | ICD-10-CM

## 2022-12-01 DIAGNOSIS — R011 Cardiac murmur, unspecified: Secondary | ICD-10-CM

## 2022-12-01 DIAGNOSIS — E782 Mixed hyperlipidemia: Secondary | ICD-10-CM

## 2022-12-01 DIAGNOSIS — Z951 Presence of aortocoronary bypass graft: Secondary | ICD-10-CM

## 2022-12-01 NOTE — Patient Instructions (Signed)
Medication Instructions:  Your physician recommends that you continue on your current medications as directed. Please refer to the Current Medication list given to you today.  *If you need a refill on your cardiac medications before your next appointment, please call your pharmacy*   Lab Work: None If you have labs (blood work) drawn today and your tests are completely normal, you will receive your results only by: [place] (if you have MyChart) OR A paper copy in the mail If you have any lab test that is abnormal or we need to change your treatment, we will call you to review the results.   Testing/Procedures: None   Follow-Up: At [facility], you and your health needs are our priority.  As part of our continuing mission to provide you with exceptional heart care, we have created designated Provider Care Teams.  These Care Teams include your primary Cardiologist (physician) and Advanced Practice Providers (APPs -  Physician Assistants and Nurse Practitioners) who all work together to provide you with the care you need, when you need it.  We recommend signing up for the patient portal called "MyChart".  Sign up information is provided on this After Visit Summary.  MyChart is used to connect with patients for Virtual Visits (Telemedicine).  Patients are able to view lab/test results, encounter notes, upcoming appointments, etc.  Non-urgent messages can be sent to your provider as well.   To learn more about what you can do with MyChart, go to [email or web address].    Your next appointment:   9 month(s)  Provider:   Shirlee More, MD    Other Instructions None

## 2022-12-07 LAB — LAB REPORT - SCANNED: EGFR: 62

## 2022-12-14 ENCOUNTER — Telehealth: Payer: Self-pay | Admitting: Cardiology

## 2022-12-14 DIAGNOSIS — R600 Localized edema: Secondary | ICD-10-CM

## 2022-12-14 DIAGNOSIS — I110 Hypertensive heart disease with heart failure: Secondary | ICD-10-CM

## 2022-12-14 DIAGNOSIS — R0609 Other forms of dyspnea: Secondary | ICD-10-CM

## 2022-12-14 NOTE — Telephone Encounter (Signed)
  Pt c/o swelling: STAT is pt has developed SOB within 24 hours  If swelling, where is the swelling located? Both legs   How much weight have you gained and in what time span? Not sure   Have you gained 3 pounds in a day or 5 pounds in a week? Not sure   Do you have a log of your daily weights (if so, list)? Not sure   Are you currently taking a fluid pill? Yes   Are you currently SOB? Yes   Have you traveled recently? No     Pt said, she was told by her pcp to call Dr. Bettina Gavia about her swelling. She said, even though she is taking her fluid pill her legs is still swelling and she is starting to get SOB when moving round

## 2022-12-14 NOTE — Telephone Encounter (Signed)
Spoke with pt about increased swelling in her legs and shortness of breath when moving around. Dr. Agustin Cree recommended BMP & ProBNP before changing any medications. Pt agreed and will come in for labs.

## 2022-12-16 ENCOUNTER — Telehealth: Payer: Self-pay | Admitting: Cardiology

## 2022-12-16 LAB — BASIC METABOLIC PANEL
BUN/Creatinine Ratio: 18 (ref 12–28)
BUN: 22 mg/dL (ref 8–27)
CO2: 19 mmol/L — ABNORMAL LOW (ref 20–29)
Calcium: 9.2 mg/dL (ref 8.7–10.3)
Chloride: 109 mmol/L — ABNORMAL HIGH (ref 96–106)
Creatinine, Ser: 1.22 mg/dL — ABNORMAL HIGH (ref 0.57–1.00)
Glucose: 123 mg/dL — ABNORMAL HIGH (ref 70–99)
Potassium: 4.4 mmol/L (ref 3.5–5.2)
Sodium: 143 mmol/L (ref 134–144)
eGFR: 45 mL/min/{1.73_m2} — ABNORMAL LOW (ref >59)

## 2022-12-16 LAB — PRO B NATRIURETIC PEPTIDE: NT-Pro BNP: 889 pg/mL — ABNORMAL HIGH (ref 0–738)

## 2022-12-16 NOTE — Telephone Encounter (Signed)
Called patient and informed her that Dr. Agustin Cree had not interpreted her lab results at this time. Asked her to give Dr. Agustin Cree another day to interpret the results of her lab work. Patient was agreeable with this plan and had no further questions at this time.

## 2022-12-16 NOTE — Telephone Encounter (Signed)
Follow Up:     Patient says she needs someone to please call and explain her lab results.

## 2022-12-19 NOTE — Telephone Encounter (Signed)
Patient's son is following up regarding lab results. He is requesting the patient be called regarding them due to him having a meeting. Please advise.

## 2022-12-20 ENCOUNTER — Telehealth: Payer: Self-pay | Admitting: Cardiology

## 2022-12-20 ENCOUNTER — Encounter: Payer: Self-pay | Admitting: Cardiology

## 2022-12-20 NOTE — Telephone Encounter (Signed)
Called patient and she reported that she has lower extremity edema in both of her lower legs and feet. She states that she was advised to take an extra fluid pill but that doesn't seem to be working She also reported that a few days ago she had a sharp pain/ pressure in the center of her chest that lasted for 10 minutes and went straight from the center of her chest to her back. After about 10 minutes she stated that the pressure eased off. Patient wears oxygen around her house and she stated that she was not wearing her oxygen when she had the pain/pressure that lasted for 10 minutes. I advised her that when she is moving around her house to wear her oxygen. Please advise

## 2022-12-20 NOTE — Telephone Encounter (Signed)
I have called and spoke with the pt regarding her swelling and Dr. Joya Gaskins recommendation. Advised to wear compression hose to decrease swelling. Pt states that due to arthritis she is unable to get hose on. Advised to try ace wrap for compression. Decrease salt in diet. Pt states that she does not use salt. Pt and her son verbalized understanding and had no additional questions.

## 2022-12-20 NOTE — Telephone Encounter (Signed)
Pt c/o swelling: STAT is pt has developed SOB within 24 hours  If swelling, where is the swelling located?   Both legs  How much weight have you gained and in what time span?   Unknown  Have you gained 3 pounds in a day or 5 pounds in a week?   Unknown  Do you have a log of your daily weights (if so, list)?   No scales at patient's home  Are you currently taking a fluid pill?   Yes  Are you currently SOB?   Unknown  Have you traveled recently?   No  Son stated patient has been having a hard time breathing and noted patient is on oxygen.  Son stated patient was advised to take an extra fluid pill which helped a little.  Son stated yesterday patient told him she had a bad pain in her chest which lasted a short time. Son stated patient can be contacted directly.

## 2022-12-20 NOTE — Telephone Encounter (Signed)
Patient informed of the results

## 2022-12-20 NOTE — Telephone Encounter (Signed)
Called patient and informed her that the results have not been interpreted at this time. Asked the patient to give [place] a couple of days for the results to be interpreted. Patient was agreeable with this plan and had no further questions at this time.

## 2022-12-22 ENCOUNTER — Telehealth (HOSPITAL_COMMUNITY): Payer: Self-pay | Admitting: Vascular Surgery

## 2022-12-22 NOTE — Telephone Encounter (Signed)
Pt is not appropriate for [facility]/ referring office is aware

## 2022-12-28 ENCOUNTER — Telehealth: Payer: Self-pay | Admitting: Cardiology

## 2022-12-28 NOTE — Telephone Encounter (Signed)
Office calling to get patient last office notes as well as the referral from dr. Bettina Gavia. Fax [phone]

## 2023-01-26 ENCOUNTER — Telehealth: Payer: Self-pay | Admitting: Cardiology

## 2023-01-26 NOTE — Telephone Encounter (Signed)
Patient's son is requesting provider switch from Dr. Dulce Sellar to Dr. Tomie China.

## 2023-03-11 ENCOUNTER — Other Ambulatory Visit: Payer: Self-pay | Admitting: Cardiology

## 2023-05-04 ENCOUNTER — Encounter (HOSPITAL_BASED_OUTPATIENT_CLINIC_OR_DEPARTMENT_OTHER): Payer: Self-pay | Admitting: Pulmonary Disease

## 2023-05-04 ENCOUNTER — Ambulatory Visit (HOSPITAL_BASED_OUTPATIENT_CLINIC_OR_DEPARTMENT_OTHER): Payer: PPO | Admitting: Pulmonary Disease

## 2023-05-04 VITALS — BP 134/52 | HR 55 | Resp 16 | Ht 63.0 in | Wt 167.0 lb

## 2023-05-04 DIAGNOSIS — J9611 Chronic respiratory failure with hypoxia: Secondary | ICD-10-CM

## 2023-05-04 DIAGNOSIS — R0602 Shortness of breath: Secondary | ICD-10-CM

## 2023-05-04 MED ORDER — ALBUTEROL SULFATE (2.5 MG/3ML) 0.083% IN NEBU: 2.5000 mg | INHALATION_SOLUTION | Freq: Four times a day (QID) | RESPIRATORY_TRACT | 12 refills | Status: AC | PRN

## 2023-05-04 NOTE — Patient Instructions (Addendum)
Chronic hypoxemic respiratory failure - unclear etiology, ?pulmonary HTN OSA not on CPAP --Ambulatory O2 with desaturations that improved on 2L --CONTINUE 2L O2 pulsed with activity --CONTINUE 2L O2 nightly with sleep --Reconsider CPAP in the future --Will obtain records from [facility] including notes, PFTs (DLCO) --ORDER dedicated CT Chest without contrast to evaluate for parenchymal disease --Consider right heart cath if DLCO is low  Shortness of breath --ORDER nebulizer --Albuterol neb ONE in the morning and evening

## 2023-05-04 NOTE — Progress Notes (Signed)
Subjective:   PATIENT ID: Tina Dominguez GENDER: female DOB: 1941-01-19, MRN: [id number]  Chief Complaint  Patient presents with   Consult    SOB possible COPD    Reason for Visit: New consult for shortness of breath. Does not think she has COPD  [name] is a 82 year old female never smoker with OSA not CPAP, HTN, CAD s/p CABG, atrial fibrillation, hx recurrent pulmonary embolism, clotting disorder, chronic anemia, HLD, hx TIAs, bilateral carotid stenosis, CKD IIIa, hypothyroidism who presents for second opinion.  Previously followed by Pulmonary in [place] with Dr. Rachael Darby for 2 years for presumed COPD/asthma. She presents for a second opinion for management of shortness of breath. Denies shortness of breath and wheezing. Denies nocturnal symptoms. Son reports witness apnea. She has tried Clinical cytogeneticist and Advair for a few weeks without improvement. She reports she has been on oxygen as needed for around 5-7 years but has been wearing her oxygen more frequently in the last two months due to shortness of breath. She wears 2L pulsed when O2 saturations in the 80s on room air.  Wears oxygen nightly.  Social History: Never smoker Second hand smoke exposure  I have personally reviewed patient's past medical/family/social history, allergies, current medications.  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   Hypothyroidism    Leg pain    with walking   Myocardial infarction [facility])    Occlusion and stenosis of carotid artery without mention of cerebral infarction 10/08/2012   Osteoarthritis 05/26/2011   Osteoporosis    Pancreatitis    PAT (paroxysmal atrial tachycardia) 12/05/2018   PE (pulmonary embolism) 05/26/2011   Pneumonia    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HCC) 04/17/2017   Reflux    Renal failure 12/13/2017   S/P IVC filter 12/13/2017   Schatzki's ring 12/13/2017   Sleep apnea    does not use cpap   Stroke [facility])    X's 4   Valvular heart disease 12/13/2017     Family History  Problem Relation Age of Onset   Clotting disorder Mother    Deep vein thrombosis Mother    Diabetes Mother    Heart disease Mother    Hyperlipidemia Mother    Hypertension Mother    Heart disease Father 37  Varicose Veins Father    Stroke Maternal Grandmother    Deep vein thrombosis Sister    Diabetes Sister    Heart disease Brother      Social History   Occupational History   Not on file  Tobacco Use   Smoking status: Never   Smokeless tobacco: Never  Vaping Use   Vaping status: Never Used  Substance and Sexual Activity   Alcohol use: No   Drug use: No   Sexual activity: Not Currently    Birth control/protection: Surgical    Comment: Hysterectomy    No Known Allergies   Outpatient Medications Prior to Visit  Medication Sig Dispense  Refill   acetaminophen (TYLENOL) 500 MG tablet Take 500-1,000 mg by mouth every 6 (six) hours as needed for moderate pain.      amiodarone (PACERONE) 200 MG tablet TAKE 1 TABLET(200 MG) BY MOUTH DAILY 90 tablet 3   amLODipine (NORVASC) 10 MG tablet Take 10 mg by mouth daily.     atorvastatin (LIPITOR) 40 MG tablet Take 1 tablet (40 mg total) by mouth daily. 90 tablet 2   citalopram (CELEXA) 40 MG tablet Take 40 mg by mouth at bedtime. Take with 10 mg to equal 50 mg at bedtime     cloNIDine (CATAPRES) 0.1 MG tablet Take 1 tablet (0.1 mg total) by mouth at bedtime. Take 1 extra tablet daily if your systolic BP (top number) is greater than 180 and if your heart rate is greater than 100. 120 tablet 3   famotidine (PEPCID) 20 MG tablet Take 20 mg by mouth daily as needed for heartburn or indigestion.     furosemide (LASIX) 20 MG tablet Take 20 mg by mouth daily.     levothyroxine (SYNTHROID) 125 MCG tablet Take 125 mcg by mouth daily before breakfast.     linaclotide (LINZESS) 290 MCG CAPS capsule Take 290 mcg by mouth every other day.     lisinopril (ZESTRIL) 20 MG tablet Take 20 mg by mouth daily.     metoprolol tartrate (LOPRESSOR) 25 MG tablet Metoprolol tartrate 12.5 mg (half tablet) in the morning daily. Hold for heart rate <100. (Patient taking differently: Take 12.5 mg by mouth daily as needed (Heart rate >100). Metoprolol tartrate 12.5 mg (half tablet) in the morning daily as needed for heart >100) 30 tablet 2   mirtazapine (REMERON) 30 MG tablet Take 30 mg by mouth at bedtime.      Multiple Vitamin (MULTIVITAMIN) tablet Take 1 tablet by mouth daily.     naloxone (NARCAN) nasal spray 4 mg/0.1 mL as directed Nasally daily as needed for opioid over dose for 30 days     Oxycodone HCl 10 MG TABS Take 10 mg by mouth every 6 (six) hours as needed (pain).     OXYGEN Inhale 2 L into the lungs.     potassium chloride SA (KLOR-CON M) 20 MEQ tablet Take 20 mEq by mouth 2 (two) times daily.     XARELTO 20  MG TABS tablet Take 20 mg by mouth daily.  0   citalopram (CELEXA) 10 MG tablet Take 10 mg by mouth at bedtime. Take with 40 mg to equal 50 mg at bedtime     No facility-administered medications prior to visit.    Review of Systems  Constitutional:  Negative for chills, diaphoresis, fever, malaise/fatigue and weight loss.  HENT:  Negative for congestion.   Respiratory:  Positive for shortness of breath. Negative for cough, hemoptysis, sputum production and  wheezing.   Cardiovascular:  Negative for chest pain, palpitations and leg swelling.     Objective:   Vitals:   05/04/23 0839  BP: (!) 134/52  Pulse: (!) 55  Resp: 16  SpO2: 97%  Weight: 167 lb (75.8 kg)  Height: 5\' 3"  (1.6 m)   SpO2: 97 % FiO2 (%): 97 % on 2L pulsed  Physical Exam: General: Chronically ill and pale-appearing, no acute distress HENT: [place], AT Eyes: EOMI, no scleral icterus Respiratory: Clear to auscultation bilaterally.  No crackles, wheezing or rales Cardiovascular: RRR, -M/R/G, no JVD Extremities:-Edema,-tenderness Neuro: AAO x4, CNII-XII grossly intact Psych: Normal mood, normal affect  Data Reviewed:  Imaging: CXR 10/14/21 - No acute infiltrate effusion or edema CT A/P 02/03/22 - Lower lung fields. No infiltrate effusion or edema  PFT: Last PFTs 2024  Labs: CBC    Component Value Date/Time   WBC 6.2 04/19/2022 0735   RBC 3.98 04/19/2022 0735   HGB 10.2 (L) 04/19/2022 0735   HGB 12.9 12/13/2019 1346   HCT 35.1 (L) 04/19/2022 0735   HCT 38.8 12/13/2019 1346   PLT 287 04/19/2022 0735   PLT 382 12/13/2019 1346   MCV 88.2 04/19/2022 0735   MCV 95 12/13/2019 1346   MCH 25.6 (L) 04/19/2022 0735   MCHC 29.1 (L) 04/19/2022 0735   RDW 16.0 (H) 04/19/2022 0735   RDW 12.7 12/13/2019 1346   LYMPHSABS 1.3 02/02/2022 2241   MONOABS 0.6 02/02/2022 2241   EOSABS 0.1 02/02/2022 2241   BASOSABS 0.0 02/02/2022 2241   02/03/23 Hg 10  Echo: 12/2022 [facility] - EF 60-65%. RV cavity normal. RV global  systolic function normal. Severely dilated left atria. Moderately dilated right atria. PASP 36 mm Hg.    Assessment & Plan:   Discussion: 82 year old female never smoker with OSA not CPAP, HTN, CAD s/p CABG, atrial fibrillation, hx recurrent pulmonary embolism, clotting disorder, chronic anemia, HLD, hx TIAs, bilateral carotid stenosis, CKD IIIa, hypothyroidism who presents for evaluation for shortness of breath. Ambulatory O2 with desaturations that improved on 2L. Echo suggestive of pulmonary hypertension. Likely secondary to uncontrolled OSA. Mild anemia could be contributing as well, unchanged Hg 10 in the since 2023. Management and further work-up as noted below.  Chronic hypoxemic respiratory failure - unclear etiology, ?pulmonary HTN OSA not on CPAP --Ambulatory O2 with desaturations that improved on 2L --CONTINUE 2L O2 pulsed with activity --CONTINUE 2L O2 nightly with sleep --Reconsider CPAP in the future --Will obtain records from [facility] including notes, PFTs (DLCO) --ORDER dedicated CT Chest without contrast to evaluate for parenchymal disease --Consider right heart cath if DLCO is low  Shortness of breath --ORDER nebulizer --Albuterol neb ONE in the morning and evening  Health Maintenance Immunization History  Administered Date(s) Administered   PFIZER Comirnaty(Gray Top)Covid-19 Tri-Sucrose Vaccine 10/02/2019, 10/23/2019, 09/15/2020   Pfizer Covid-19 Vaccine Bivalent Booster 70yrs & up 08/03/2021   CT Lung Screen - not qualified  Orders Placed This Encounter  Procedures   CT Chest Wo Contrast    Standing Status:   Future    Standing Expiration Date:   05/03/2024    Order Specific Question:   Preferred imaging location?    Answer:   [facility]   Meds ordered this encounter  Medications   albuterol (PROVENTIL) (2.5 MG/3ML) 0.083% nebulizer solution    Sig: Take 3 mLs (2.5 mg total) by nebulization every 6 (six) hours as needed for wheezing or  shortness of breath.    Dispense:  75 mL    Refill:  12    Return end of September.  I have spent a total time of 45-minutes on the day of the appointment reviewing prior documentation, coordinating care and discussing medical diagnosis and plan with the patient/family. Imaging, labs and tests included in this note have been reviewed and interpreted independently by me.   Mechele Collin, MD [facility] 05/04/2023 9:37 AM  Office Number [phone]

## 2023-05-18 ENCOUNTER — Telehealth: Payer: Self-pay

## 2023-05-18 NOTE — Telephone Encounter (Signed)
Entered in error

## 2023-06-02 ENCOUNTER — Ambulatory Visit (HOSPITAL_BASED_OUTPATIENT_CLINIC_OR_DEPARTMENT_OTHER)
Admission: RE | Admit: 2023-06-02 | Discharge: 2023-06-02 | Disposition: A | Discharge status: Home / Self Care | Payer: PPO | Source: Ambulatory Visit | Attending: Pulmonary Disease

## 2023-06-02 DIAGNOSIS — J9611 Chronic respiratory failure with hypoxia: Secondary | ICD-10-CM | POA: Insufficient documentation

## 2023-06-15 ENCOUNTER — Encounter (HOSPITAL_BASED_OUTPATIENT_CLINIC_OR_DEPARTMENT_OTHER): Payer: Self-pay | Admitting: Pulmonary Disease

## 2023-08-29 ENCOUNTER — Encounter (HOSPITAL_BASED_OUTPATIENT_CLINIC_OR_DEPARTMENT_OTHER): Payer: Self-pay | Admitting: Pulmonary Disease

## 2023-08-29 ENCOUNTER — Ambulatory Visit (HOSPITAL_BASED_OUTPATIENT_CLINIC_OR_DEPARTMENT_OTHER): Payer: PPO | Admitting: Pulmonary Disease

## 2023-08-29 VITALS — BP 122/62 | HR 54 | Resp 16 | Ht 63.0 in | Wt 167.0 lb

## 2023-08-29 DIAGNOSIS — G4733 Obstructive sleep apnea (adult) (pediatric): Secondary | ICD-10-CM

## 2023-08-29 DIAGNOSIS — J9611 Chronic respiratory failure with hypoxia: Secondary | ICD-10-CM

## 2023-08-29 DIAGNOSIS — R0602 Shortness of breath: Secondary | ICD-10-CM

## 2023-08-29 NOTE — Patient Instructions (Addendum)
Chronic hypoxemic respiratory failure - secondary to infiltrates which are not improved +/- possible pulmonary HTN OSA not on CPAP --Reviewed CT scan with improved parenchymal invovlement --Ambulatory O2 with desaturations to 91%. Improved --CONTINUE 2L O2 pulsed with activity for goal SpO2 >88% --CONTINUE 2L O2 nightly with sleep  Shortness of breath --ORDER nebulizer --Albuterol neb ONE in the morning and evening

## 2023-08-29 NOTE — Progress Notes (Signed)
Subjective:   PATIENT ID: Tina Broom Dominguez: female DOB: 1941/06/20, MRN: [id number]  Chief Complaint  Patient presents with   Follow-up    CT FU.     Reason for Visit: Follow-up  Ms. Keairah Tuley is a 82 year old female never smoker with OSA not CPAP, HTN, CAD s/p CABG, atrial fibrillation, hx recurrent pulmonary embolism, clotting disorder, chronic anemia, HLD, hx TIAs, bilateral carotid stenosis, CKD IIIa, hypothyroidism who presents for follow-up  Initial consult Previously followed by Pulmonary in [place] with Dr. Rachael Darby for 2 years for presumed COPD/asthma. She presents for a second opinion for management of shortness of breath. Denies shortness of breath and wheezing. Denies nocturnal symptoms. Son reports witness apnea. She has tried Clinical cytogeneticist and Advair for a few weeks without improvement. She reports she has been on oxygen as needed for around 5-7 years but has been wearing her oxygen more frequently in the last two months due to shortness of breath. She wears 2L pulsed when O2 saturations in the 80s on room air.  Wears oxygen nightly.  08/29/23 Since our last visit she has been compliant with oxygen. Does believe she desats when walking to 80s but has not checked for a few weeks. Denies coughing or wheezing. Does have shortness of breath with activity and short distances in the home. Has not received nebulizer so not on any respiratory medications at home.  Social History: Never smoker Second hand smoke exposure  Past Medical History:  Diagnosis Date   Acute pancreatitis 10/14/2021   Aftercare following surgery of the circulatory system, NEC 10/14/2013   Anemia 12/13/2017   Anxiety 12/13/2017   Arthritis    CAD (coronary artery disease) 12/10/2018   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HCC) 12/18/2017   Carotid artery occlusion    Carotid stenosis, bilateral 05/26/2011   CKD (chronic kidney disease) stage 3, GFR 30-59 ml/min (HCC) 12/13/2017   Clotting disorder  (HCC)    Coronary artery disease    Depression    Dysphagia 04/17/2017   Factor V Leiden mutation (HCC) 12/18/2017   Per patient, MD in [place] said pt did not have Factor 5 but did have some kind of blood disorder-clots   GERD (gastroesophageal reflux disease) 05/26/2011   GI bleeding 10/04/2017   Gout 12/13/2017   Hiatal hernia 12/13/2017   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   Hypothyroidism    Leg pain    with walking   Myocardial infarction [facility])    Occlusion and stenosis of carotid artery without mention of cerebral infarction 10/08/2012   Osteoarthritis 05/26/2011   Osteoporosis    Pancreatitis    PAT (paroxysmal atrial tachycardia) (HCC) 12/05/2018   PE (pulmonary embolism) 05/26/2011   Pneumonia    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HCC) 04/17/2017   Reflux    Renal failure 12/13/2017   S/P IVC filter 12/13/2017   Schatzki's ring 12/13/2017   Sleep apnea    does not use cpap   Stroke [facility])    X's 4   Valvular heart disease 12/13/2017     Family History  Problem Relation Age of Onset   Clotting disorder Mother  Deep vein thrombosis Mother    Diabetes Mother    Heart disease Mother    Hyperlipidemia Mother    Hypertension Mother    Heart disease Father 42   Varicose Veins Father    Stroke Maternal Grandmother    Deep vein thrombosis Sister    Diabetes Sister    Heart disease Brother      Social History   Occupational History   Not on file  Tobacco Use   Smoking status: Never   Smokeless tobacco: Never  Vaping Use   Vaping status: Never Used  Substance and Sexual Activity   Alcohol use: No   Drug use: No   Sexual  activity: Not Currently    Birth control/protection: Surgical    Comment: Hysterectomy    No Known Allergies   Outpatient Medications Prior to Visit  Medication Sig Dispense Refill   acetaminophen (TYLENOL) 500 MG tablet Take 500-1,000 mg by mouth every 6 (six) hours as needed for moderate pain.      albuterol (PROVENTIL) (2.5 MG/3ML) 0.083% nebulizer solution Take 3 mLs (2.5 mg total) by nebulization every 6 (six) hours as needed for wheezing or shortness of breath. 75 mL 12   amiodarone (PACERONE) 200 MG tablet TAKE 1 TABLET(200 MG) BY MOUTH DAILY 90 tablet 3   amLODipine (NORVASC) 10 MG tablet Take 10 mg by mouth daily.     atorvastatin (LIPITOR) 40 MG tablet Take 1 tablet (40 mg total) by mouth daily. 90 tablet 2   citalopram (CELEXA) 40 MG tablet Take 40 mg by mouth at bedtime. Take with 10 mg to equal 50 mg at bedtime     cloNIDine (CATAPRES) 0.1 MG tablet Take 1 tablet (0.1 mg total) by mouth at bedtime. Take 1 extra tablet daily if your systolic BP (top number) is greater than 180 and if your heart rate is greater than 100. 120 tablet 3   famotidine (PEPCID) 20 MG tablet Take 20 mg by mouth daily as needed for heartburn or indigestion.     furosemide (LASIX) 20 MG tablet Take 20 mg by mouth daily.     levothyroxine (SYNTHROID) 125 MCG tablet Take 125 mcg by mouth daily before breakfast.     linaclotide (LINZESS) 290 MCG CAPS capsule Take 290 mcg by mouth every other day.     lisinopril (ZESTRIL) 20 MG tablet Take 20 mg by mouth daily.     metoprolol tartrate (LOPRESSOR) 25 MG tablet Metoprolol tartrate 12.5 mg (half tablet) in the morning daily. Hold for heart rate <100. (Patient taking differently: Take 12.5 mg by mouth daily as needed (Heart rate >100). Metoprolol tartrate 12.5 mg (half tablet) in the morning daily as needed for heart >100) 30 tablet 2   mirtazapine (REMERON) 30 MG tablet Take 30 mg by mouth at bedtime.      Multiple Vitamin (MULTIVITAMIN) tablet Take 1 tablet by  mouth daily.     naloxone (NARCAN) nasal spray 4 mg/0.1 mL as directed Nasally daily as needed for opioid over dose for 30 days     Oxycodone HCl 10 MG TABS Take 10 mg by mouth every 6 (six) hours as needed (pain).     OXYGEN Inhale 2 L into the lungs.     potassium chloride SA (KLOR-CON M) 20 MEQ tablet Take 20 mEq by mouth 2 (two) times daily.     XARELTO 20 MG TABS tablet Take 20 mg by mouth daily.  0   No facility-administered medications prior to visit.  Review of Systems  Constitutional:  Negative for chills, diaphoresis, fever, malaise/fatigue and weight loss.  HENT:  Negative for congestion.   Respiratory:  Positive for shortness of breath. Negative for cough, hemoptysis, sputum production and wheezing.   Cardiovascular:  Negative for chest pain, palpitations and leg swelling.     Objective:   Vitals:   08/29/23 0931  BP: 122/62  Pulse: (!) 54  Resp: 16  SpO2: 98%  Weight: 167 lb (75.8 kg)  Height: 5\' 3"  (1.6 m)   SpO2: 98 % on 2L pulsed  Physical Exam: General: Chronically ill and pale-appearing, no acute distress HENT: [place], AT Eyes: EOMI, no scleral icterus Respiratory: Clear to auscultation bilaterally.  No crackles, wheezing or rales Cardiovascular: RRR, -M/R/G, no JVD Extremities:-Edema,-tenderness Neuro: AAO x4, CNII-XII grossly intact Psych: Normal mood, normal affect   Data Reviewed:  Imaging: CXR 10/14/21 - No acute infiltrate effusion or edema CT A/P 02/03/22 - Lower lung fields. No infiltrate effusion or edema CT Chest 06/02/23 - Significantly improved aeration bilateral with small GGO scattered compared to 01/19/23 CT imaging.  PFT: Last PFTs 2024  Labs: CBC    Component Value Date/Time   WBC 6.2 04/19/2022 0735   RBC 3.98 04/19/2022 0735   HGB 10.2 (L) 04/19/2022 0735   HGB 12.9 12/13/2019 1346   HCT 35.1 (L) 04/19/2022 0735   HCT 38.8 12/13/2019 1346   PLT 287 04/19/2022 0735   PLT 382 12/13/2019 1346   MCV 88.2 04/19/2022 0735   MCV  95 12/13/2019 1346   MCH 25.6 (L) 04/19/2022 0735   MCHC 29.1 (L) 04/19/2022 0735   RDW 16.0 (H) 04/19/2022 0735   RDW 12.7 12/13/2019 1346   LYMPHSABS 1.3 02/02/2022 2241   MONOABS 0.6 02/02/2022 2241   EOSABS 0.1 02/02/2022 2241   BASOSABS 0.0 02/02/2022 2241   02/03/23 Hg 10  Echo: 12/2022 [facility] - EF 60-65%. RV cavity normal. RV global systolic function normal. Severely dilated left atria. Moderately dilated right atria. PASP 36 mm Hg.    Assessment & Plan:   Discussion: 82 year old female never smoker with OSA not CPAP, HTN, CAD s/p CABG, atrial fibrillation, hx recurrent pulmonary embolism, clotting disorder, chronic anemia, HLD, hx TIAs, bilateral carotid stenosis, CKD IIIa, hypothyroidism who presents for follow-up for shortness of breath. Improved CT scan and improved ambulatory O2 saturations with walking. Will still need to continue nocturnal O2 and recommendation to wear POC when saturations <88%.  Priorcho suggestive of pulmonary hypertension. Likely secondary to uncontrolled OSA however patient not interested in CPAP. Deconditioning likely playing a role however she declines any therapy.  Chronic hypoxemic respiratory failure - unclear etiology, ?pulmonary HTN OSA not on CPAP --Reviewed CT scan with improved parenchymal invovlement --Ambulatory O2 with desaturations to 91%. Improved --CONTINUE 2L O2 pulsed with activity for goal SpO2 >88% --CONTINUE 2L O2 nightly with sleep --Reconsider CPAP in the future. Patient declines for now --Will obtain records from [facility] including notes, PFTs (DLCO) --Consider right heart cath if DLCO is low  Shortness of breath Deconditioning --ORDER nebulizer --Albuterol neb ONE in the morning and evening --Patient declined pulmonary rehab and declined HHPT  Health Maintenance Immunization History  Administered Date(s) Administered   PFIZER Comirnaty(Gray Top)Covid-19 Tri-Sucrose Vaccine 10/02/2019, 10/23/2019, 09/15/2020    Pfizer Covid-19 Vaccine Bivalent Booster 4yrs & up 08/03/2021   CT Lung Screen - not qualified  Orders Placed This Encounter  Procedures   Ambulatory Referral for DME    Referral Priority:  Routine    Referral Type:   Durable Medical Equipment Purchase    Number of Visits Requested:   1   No orders of the defined types were placed in this encounter.   Return in about 4 months (around 12/28/2023).  I have spent a total time of 30-minutes on the day of the appointment including chart review, data review, collecting history, coordinating care and discussing medical diagnosis and plan with the patient/family. Past medical history, allergies, medications were reviewed. Pertinent imaging, labs and tests included in this note have been reviewed and interpreted independently by me.  Kerigan Narvaez Mechele Collin, MD [facility] 08/29/2023 10:27 AM  Office Number [phone]

## 2023-12-08 ENCOUNTER — Other Ambulatory Visit: Payer: Self-pay | Admitting: Cardiology

## 2023-12-11 ENCOUNTER — Other Ambulatory Visit: Payer: Self-pay | Admitting: Cardiology

## 2023-12-11 NOTE — Telephone Encounter (Signed)
 Rx refill sent to pharmacy.

## 2024-01-13 ENCOUNTER — Encounter (HOSPITAL_BASED_OUTPATIENT_CLINIC_OR_DEPARTMENT_OTHER): Payer: Self-pay | Admitting: Pulmonary Disease

## 2024-01-15 NOTE — Telephone Encounter (Signed)
 Can you help with this?

## 2024-01-17 ENCOUNTER — Ambulatory Visit (HOSPITAL_BASED_OUTPATIENT_CLINIC_OR_DEPARTMENT_OTHER): Admitting: Pulmonary Disease

## 2024-01-24 ENCOUNTER — Ambulatory Visit (HOSPITAL_BASED_OUTPATIENT_CLINIC_OR_DEPARTMENT_OTHER): Admitting: Pulmonary Disease

## 2024-07-18 ENCOUNTER — Encounter: Payer: Self-pay | Admitting: Hematology and Oncology

## 2024-07-18 ENCOUNTER — Other Ambulatory Visit: Payer: Self-pay | Admitting: Hematology and Oncology

## 2024-07-18 DIAGNOSIS — D649 Anemia, unspecified: Secondary | ICD-10-CM

## 2024-07-18 NOTE — Progress Notes (Unsigned)
 [facility] [address] [place],  [place]  [address] [phone]  Clinic Day:  07/19/2024   Referring physician: Dorene Perkins, NP  Patient Care Team: Patient Care Team: Dorene Perkins, NP as PCP - General (Nurse Practitioner) Monetta Redell PARAS, MD as PCP - Cardiology (Cardiology) Nwobu, Eulas KIDD, MD as Consulting Physician Edna Toribio LABOR, MD as Consulting Physician (Orthopedic Surgery)   REASON FOR CONSULTATION:  Anemia  HISTORY OF PRESENT ILLNESS:   Tina Dominguez is a 83 y.o. female with a history of anemia who is referred in consultation by Perkins Dorene, NP for assessment and management. She was seen in the home on October 1 due to being homebound.  CBC on October 7 revealed hemoglobin 10.7 with an MCV of 96.9.  Platelets 281,000.  WBC 6.2, 64% neutrophils, 24% lymphocytes, 8% monocytes, 4% eosinophils and 1% basophils.  B12 was 321, iron 42, ferritin 63, TIBC 264, folate 68.  TSH was normal CMP was unremarkable except for calcium  8.4 BUN 23.  In August, hemoglobin was 11.9 with an MCV of 96, platelets 423,000, normal WBCs.  In April, her hemoglobin had been 10.1 with an MCV of 99, normal WBCs and platelets.  Serum iron 26, TIBC 260, ferritin 49, B12 508.  In October 2024, her hemoglobin was 11.8 with an MCV of 92.  She reports chronic fatigue.  She has shortness of breath with exertion.  She has oxygen  to use as needed.  She denies any overt form of blood loss.  She denies pica to ice.  She states she has been on a oral iron supplement, but this caused nausea and vomiting.  She is taking a multivitamin with iron.  Her activity is limited by severe arthritis.  In August, she had worsening joint pain and swelling.  Her ANA was positive, EST and CRP elevated.  She was seen by Dr. Mikel in rheumatology and diagnosed with Sjogren's syndrome.  She has a history of recurrent pulmonary emboli and stroke.  States that she has hypercoagulable disorder, but  not factor V Leiden.  Review of her records shows factor V Leiden was negative in 2018 at [facility].  She remains on lifelong anticoagulation, currently with rivaroxaban .  She is bridged with enoxaparin  for procedure.  She follows with nephrology for chronic kidney disease.  Past Medical History: Hypertension, heart failure, chronic kidney disease, hypercoagulability, coronary artery disease, carotid stenosis, valvular heart disease, paroxysmal atrial fibrillation, hiatal hernia, GERD, hypothyroidism, osteoarthritis, osteoporosis, sleep apnea, depression/anxiety.  History of recurrent pulmonary emboli with IVC filter placement, stroke, recurrent TIAs, myocardial infarction, kidney stones x 3-4, gout, acute pancreatitis x3.  Status post CABG x 3, cholecystectomy, hysterectomy for dub, appendectomy, bilateral knee replacement, benign bilateral breast biopsy, left carotid endarterectomy, lumbar fusion, bladder surgery.  Social History:  She has never smoked. She denies alcohol or other substance use. She was born and raised in [place].  She is widowed and lives alone.  She has 4 children, 9 grandchildren and 40 great-grandchildren, with another due soon.  She is a retired Lawyer, worked at [employer] for 30 years.   Family History:  Mother had uncertain hypercoagulable disorder and associated deep venous thrombosis.  There is no known family history of cancer.   REVIEW OF SYSTEMS:   Review of Systems  Constitutional:  Positive for fatigue. Negative for appetite change, chills, fever and unexpected weight change.  HENT:   Negative for lump/mass, mouth sores, nosebleeds and sore throat.   Respiratory:  Positive for shortness of breath. Negative for cough and hemoptysis.   Cardiovascular:  Negative for chest pain and leg swelling.  Gastrointestinal:  Negative for abdominal pain, blood in stool, constipation, diarrhea, nausea and vomiting.  Genitourinary:  Negative for difficulty urinating, dysuria,  frequency, hematuria and vaginal bleeding.   Musculoskeletal:  Positive for back pain. Negative for arthralgias and myalgias.  Skin:  Negative for rash.  Neurological:  Negative for dizziness and headaches.  Hematological:  Negative for adenopathy. Does not bruise/bleed easily.  Psychiatric/Behavioral:  Negative for depression and sleep disturbance. The patient is not nervous/anxious.      VITALS:   Blood pressure 131/60, pulse (!) 59, temperature 98.3 F (36.8 C), temperature source Oral, resp. rate 18, height 5' 1 (1.549 m), weight 146 lb 11.2 oz (66.5 kg), SpO2 96%.  Wt Readings from Last 3 Encounters:  07/19/24 146 lb 11.2 oz (66.5 kg)  08/29/23 167 lb (75.8 kg)  05/04/23 167 lb (75.8 kg)    Body mass index is 27.72 kg/m.  Performance status (ECOG): 2 - Symptomatic, <50% confined to bed  PHYSICAL EXAM:   Physical Exam Vitals and nursing note reviewed.  Constitutional:      General: She is not in acute distress.    Appearance: Normal appearance.  HENT:     Head: Normocephalic and atraumatic.     Mouth/Throat:     Mouth: Mucous membranes are moist.     Pharynx: Oropharynx is clear. No oropharyngeal exudate or posterior oropharyngeal erythema.  Eyes:     General: No scleral icterus.    Extraocular Movements: Extraocular movements intact.     Conjunctiva/sclera: Conjunctivae normal.     Pupils: Pupils are equal, round, and reactive to light.  Cardiovascular:     Rate and Rhythm: Normal rate and regular rhythm.     Heart sounds: Normal heart sounds. No murmur heard.    No friction rub. No gallop.  Pulmonary:     Effort: Pulmonary effort is normal.     Breath sounds: Normal breath sounds. No wheezing, rhonchi or rales.  Abdominal:     General: There is no distension.     Palpations: Abdomen is soft. There is no hepatomegaly, splenomegaly or mass.     Tenderness: There is no abdominal tenderness.  Musculoskeletal:        General: Normal range of motion.     Cervical  back: Normal range of motion and neck supple. No tenderness.     Right lower leg: No edema.     Left lower leg: No edema.  Lymphadenopathy:     Cervical: No cervical adenopathy.     Upper Body:     Right upper body: No supraclavicular or axillary adenopathy.     Left upper body: No supraclavicular or axillary adenopathy.     Lower Body: No right inguinal adenopathy. No left inguinal adenopathy.  Skin:    General: Skin is warm and dry.     Coloration: Skin is not jaundiced.     Findings: No rash.  Neurological:     Mental Status: She is alert and oriented to person, place, and time.     Cranial Nerves: No cranial nerve deficit.  Psychiatric:        Mood and Affect: Mood normal.        Behavior: Behavior normal.        Thought Content: Thought content normal.      LABS:      Latest Ref Rng & Units  07/19/2024    9:07 AM 04/19/2022    7:35 AM 02/02/2022   10:41 PM  CBC  WBC 4.0 - 10.5 K/uL 5.8  6.2  10.7   Hemoglobin 12.0 - 15.0 g/dL 88.0  89.7  88.3   Hematocrit 36.0 - 46.0 % 38.1  35.1  37.7   Platelets 150 - 400 K/uL 277  287  408       Latest Ref Rng & Units 07/19/2024    9:07 AM 12/15/2022    9:20 AM 06/02/2022    2:49 PM  CMP  Glucose 70 - 99 mg/dL 894  876  891   BUN 8 - 23 mg/dL 18  22  16    Creatinine 0.44 - 1.00 mg/dL 8.92  8.77  8.79   Sodium 135 - 145 mmol/L 143  143  143   Potassium 3.5 - 5.1 mmol/L 4.7  4.4  4.7   Chloride 98 - 111 mmol/L 107  109  104   CO2 22 - 32 mmol/L 25  19  23    Calcium  8.9 - 10.3 mg/dL 9.3  9.2  8.7   Total Protein 6.0 - 8.5 g/dL   6.9   Total Bilirubin 0.0 - 1.2 mg/dL   0.3   Alkaline Phos 44 - 121 IU/L   132   AST 0 - 40 IU/L   20   ALT 0 - 32 IU/L   10     STUDIES:   No results found.    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   Carotid artery occlusion    Carotid stenosis, bilateral 05/26/2011   Chronic pain    CKD (chronic kidney disease) stage 3, GFR 30-59 ml/min (HCC) 12/13/2017   Clotting disorder    COPD (chronic obstructive pulmonary disease) (HCC)    Coronary artery disease    Depression    Dysphagia 04/17/2017   Factor V Leiden mutation 12/18/2017   Per patient, MD in [place] said pt did not have Factor 5 but did have some kind of blood disorder-clots   GERD (gastroesophageal reflux disease) 05/26/2011   GI bleeding 10/04/2017   Gout 12/13/2017   Hiatal hernia 12/13/2017   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   Hypothyroidism    Leg pain    with walking   Myocardial infarction [facility])    Occlusion and stenosis of carotid artery without mention of cerebral infarction 10/08/2012   Osteoarthritis 05/26/2011   Osteoporosis    Pancreatitis    PAT (paroxysmal atrial tachycardia) 12/05/2018   PE (pulmonary embolism) 05/26/2011   Pneumonia    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HCC) 04/17/2017   Reflux    Renal failure 12/13/2017   S/P IVC filter 12/13/2017   Schatzki's ring 12/13/2017   Sleep apnea    does not use cpap   Stroke (HCC)    X's 4   Valvular heart  disease 12/13/2017    Past Surgical History:  Procedure Laterality Date   ABDOMINAL HYSTERECTOMY  1980   TAH    APPENDECTOMY     BACK SURGERY  06/2011   BREAST SURGERY     x2   CAROTID ENDARTERECTOMY  11/11/2010   Right CEA   CHOLECYSTECTOMY N/A 04/19/2022   Procedure: LAPAROSCOPIC CHOLECYSTECTOMY;  Surgeon: Vanderbilt Ned, MD;  Location: [facility]  OR;  Service: General;  Laterality: N/A;   CORONARY ARTERY BYPASS GRAFT N/A 12/11/2018   Procedure: CORONARY ARTERY BYPASS GRAFTING (CABG) x three , using left internal mammary artery, and right leg greater saphenous vein harvested endoscopically - SVG to OM1, SVG to Distal RCA;  Surgeon: Army Dallas NOVAK, MD;  Location: [facility] OR;  Service: Open Heart Surgery;  Laterality: N/A;   IVC FILTER INSERTION     JOINT REPLACEMENT Right 2005   Right Total Knee   JOINT REPLACEMENT Left 2005   Left Total Knee   LAPAROSCOPIC LYSIS OF ADHESIONS N/A 04/19/2022   Procedure: LAPAROSCOPIC LYSIS OF ADHESIONS;  Surgeon: Vanderbilt Ned, MD;  Location: [facility] OR;  Service: General;  Laterality: N/A;   LEFT HEART CATH AND CORONARY ANGIOGRAPHY N/A 12/10/2018   Procedure: LEFT HEART CATH AND CORONARY ANGIOGRAPHY;  Surgeon: Dann Candyce RAMAN, MD;  Location: [facility] INVASIVE CV LAB;  Service: Cardiovascular;  Laterality: N/A;   SHOULDER SURGERY     right   SPINE SURGERY  07/11/2011   Decomp. Laminectomy, fusion of L4-5,L5-S1 by Dr. Onetha   TEE WITHOUT CARDIOVERSION N/A 12/11/2018   Procedure: TRANSESOPHAGEAL ECHOCARDIOGRAM (TEE);  Surgeon: Army Dallas NOVAK, MD;  Location: [facility] OR;  Service: Open Heart Surgery;  Laterality: N/A;   TOTAL KNEE ARTHROPLASTY     right  and left   TOTAL KNEE ARTHROPLASTY WITH REVISION COMPONENTS Right 12/01/2021   Procedure: TOTAL KNEE ARTHROPLASTY WITH REVISION COMPONENTS;  Surgeon: Edna Toribio LABOR, MD;  Location: [facility] ORS;  Service: Orthopedics;  Laterality: Right;   WISDOM TOOTH EXTRACTION      Family History  Problem Relation Age of Onset   Stroke Mother    Clotting disorder Mother    Deep vein thrombosis Mother    Diabetes Mother    Heart disease Mother    Hyperlipidemia Mother    Hypertension Mother    Heart disease Father 81   Varicose Veins Father    Prostate cancer Father    Deep vein thrombosis Sister    Diabetes Sister    Heart disease Brother    Stroke Maternal  Grandmother    Basal cell carcinoma Daughter    Basal cell carcinoma Daughter    Basal cell carcinoma Daughter     Social History:  reports that she has never smoked. She has never used smokeless tobacco. She reports that she does not drink alcohol and does not use drugs.The patient is accompanied by son, Tina Dominguez, today.  Allergies:  Allergies  Allergen Reactions   Sulfamethoxazole-Trimethoprim Other (See Comments)    Kidney failure    Current Medications: Current Outpatient Medications  Medication Sig Dispense Refill   furosemide  (LASIX ) 20 MG tablet Take 20 mg by mouth 2 (two) times daily as needed. (Patient taking differently: Take 20 mg by mouth 2 (two) times daily as needed.)     metoprolol  tartrate (LOPRESSOR ) 25 MG tablet Metoprolol  tartrate 12.5 mg (half tablet) in the morning daily. Hold for heart rate <100. 30 tablet 2   potassium chloride  (KLOR-CON  M) 10 MEQ tablet Take 10 mEq by mouth  daily.     acetaminophen  (TYLENOL ) 500 MG tablet Take 500-1,000 mg by mouth every 6 (six) hours as needed for moderate pain.      albuterol  (PROVENTIL ) (2.5 MG/3ML) 0.083% nebulizer solution Take 3 mLs (2.5 mg total) by nebulization every 6 (six) hours as needed for wheezing or shortness of breath. 75 mL 12   amiodarone  (PACERONE ) 200 MG tablet TAKE 1 TABLET(200 MG) BY MOUTH DAILY 90 tablet 3   amLODipine  (NORVASC ) 10 MG tablet Take 10 mg by mouth daily.     atorvastatin  (LIPITOR) 40 MG tablet Take 1 tablet (40 mg total) by mouth daily. 2nd attempt, patient needs and appt for additional refills 15 tablet 0   citalopram  (CELEXA ) 40 MG tablet Take 40 mg by mouth at bedtime. Take with 10 mg to equal 50 mg at bedtime     cloNIDine  (CATAPRES ) 0.1 MG tablet Take 1 tablet (0.1 mg total) by mouth at bedtime. Take 1 extra tablet daily if your systolic BP (top number) is greater than 180 and if your heart rate is greater than 100. 120 tablet 3   famotidine  (PEPCID ) 20 MG tablet Take 20 mg by mouth daily as  needed for heartburn or indigestion.     furosemide  (LASIX ) 20 MG tablet Take 20 mg by mouth daily.     gabapentin (NEURONTIN) 100 MG capsule Take 100 mg by mouth 2 (two) times daily.     levothyroxine  (SYNTHROID ) 125 MCG tablet Take 125 mcg by mouth daily before breakfast.     linaclotide  (LINZESS ) 290 MCG CAPS capsule Take 290 mcg by mouth every other day.     lisinopril  (ZESTRIL ) 20 MG tablet Take 20 mg by mouth daily.     mirtazapine  (REMERON ) 30 MG tablet Take 30 mg by mouth at bedtime.      Multiple Vitamin (MULTIVITAMIN) tablet Take 1 tablet by mouth daily.     naloxone (NARCAN) nasal spray 4 mg/0.1 mL as directed Nasally daily as needed for opioid over dose for 30 days     Oxycodone  HCl 10 MG TABS Take 10 mg by mouth every 6 (six) hours as needed (pain).     OXYGEN  Inhale 2 L into the lungs.     XARELTO  20 MG TABS tablet Take 20 mg by mouth daily.  0   No current facility-administered medications for this visit.     ASSESSMENT & PLAN:   Assessment/Plan:  ADALEI NOVELL is a 83 y.o. female with mild chronic anemia, most likely anemia of chronic kidney disease/anemia of chronic disease.  Certainly, she could have slow GI blood loss being on rivaroxaban , however, her iron tests are normal.  I ordered a soluble transferrin to further assess for iron deficiency.  I also ordered a serum protein electrophoresis to evaluate for multiple myeloma.  As her hemoglobin is in good range today, we do not recommend any intervention.  Erythropoietin stimulating agents would be indicated if her hemoglobin dropped below 10.  As she is basically homebound, I will not schedule routine follow-up in our office.  We are glad to see the patient back in the future if she has worsening anemia.   I discussed the assessment and plan with the patient.  The patient was provided an opportunity to ask questions and all were answered.  The patient agreed with the plan and demonstrated an understanding of the  instructions.    Thank you for the referral    45 minutes was spent in patient care.  This included time spent preparing to see the patient (e.g., review of tests), obtaining and/or reviewing separately obtained history, counseling and educating the patient/family/caregiver, ordering medications, tests, or procedures; documenting clinical information in the electronic or other health record, independently interpreting results and communicating results to the patient/family/caregiver as well as coordination of care.      Andrez DELENA Foy, PA-C   Physician Assistant [facility] [place] [phone]

## 2024-07-19 ENCOUNTER — Encounter: Payer: Self-pay | Admitting: Hematology and Oncology

## 2024-07-19 ENCOUNTER — Other Ambulatory Visit: Payer: Self-pay

## 2024-07-19 ENCOUNTER — Inpatient Hospital Stay

## 2024-07-19 ENCOUNTER — Inpatient Hospital Stay: Attending: Hematology and Oncology | Admitting: Hematology and Oncology

## 2024-07-19 VITALS — BP 131/60 | HR 59 | Temp 98.3°F | Resp 18 | Ht 61.0 in | Wt 146.7 lb

## 2024-07-19 DIAGNOSIS — Z8673 Personal history of transient ischemic attack (TIA), and cerebral infarction without residual deficits: Secondary | ICD-10-CM | POA: Diagnosis not present

## 2024-07-19 DIAGNOSIS — D649 Anemia, unspecified: Secondary | ICD-10-CM | POA: Diagnosis present

## 2024-07-19 DIAGNOSIS — N189 Chronic kidney disease, unspecified: Secondary | ICD-10-CM | POA: Insufficient documentation

## 2024-07-19 DIAGNOSIS — M810 Age-related osteoporosis without current pathological fracture: Secondary | ICD-10-CM | POA: Insufficient documentation

## 2024-07-19 DIAGNOSIS — R5382 Chronic fatigue, unspecified: Secondary | ICD-10-CM | POA: Insufficient documentation

## 2024-07-19 DIAGNOSIS — Z808 Family history of malignant neoplasm of other organs or systems: Secondary | ICD-10-CM | POA: Insufficient documentation

## 2024-07-19 DIAGNOSIS — Z86711 Personal history of pulmonary embolism: Secondary | ICD-10-CM | POA: Diagnosis not present

## 2024-07-19 DIAGNOSIS — Z8042 Family history of malignant neoplasm of prostate: Secondary | ICD-10-CM | POA: Insufficient documentation

## 2024-07-19 DIAGNOSIS — Z79899 Other long term (current) drug therapy: Secondary | ICD-10-CM | POA: Diagnosis not present

## 2024-07-19 DIAGNOSIS — M3500 Sicca syndrome, unspecified: Secondary | ICD-10-CM | POA: Insufficient documentation

## 2024-07-19 LAB — CBC WITH DIFFERENTIAL (CANCER CENTER ONLY)
Abs Immature Granulocytes: 0.02 K/uL (ref 0.00–0.07)
Basophils Absolute: 0 K/uL (ref 0.0–0.1)
Basophils Relative: 0 %
Eosinophils Absolute: 0.1 K/uL (ref 0.0–0.5)
Eosinophils Relative: 2 %
HCT: 38.1 % (ref 36.0–46.0)
Hemoglobin: 11.9 g/dL — ABNORMAL LOW (ref 12.0–15.0)
Immature Granulocytes: 0 %
Lymphocytes Relative: 22 %
Lymphs Abs: 1.3 K/uL (ref 0.7–4.0)
MCH: 29.3 pg (ref 26.0–34.0)
MCHC: 31.2 g/dL (ref 30.0–36.0)
MCV: 93.8 fL (ref 80.0–100.0)
Monocytes Absolute: 0.5 K/uL (ref 0.1–1.0)
Monocytes Relative: 8 %
Neutro Abs: 3.9 K/uL (ref 1.7–7.7)
Neutrophils Relative %: 68 %
Platelet Count: 277 K/uL (ref 150–400)
RBC: 4.06 MIL/uL (ref 3.87–5.11)
RDW: 15.6 % — ABNORMAL HIGH (ref 11.5–15.5)
WBC Count: 5.8 K/uL (ref 4.0–10.5)
nRBC: 0 % (ref 0.0–0.2)

## 2024-07-19 LAB — TECHNOLOGIST SMEAR REVIEW

## 2024-07-19 LAB — BASIC METABOLIC PANEL - CANCER CENTER ONLY
Anion gap: 11 (ref 5–15)
BUN: 18 mg/dL (ref 8–23)
CO2: 25 mmol/L (ref 22–32)
Calcium: 9.3 mg/dL (ref 8.9–10.3)
Chloride: 107 mmol/L (ref 98–111)
Creatinine: 1.07 mg/dL — ABNORMAL HIGH (ref 0.44–1.00)
GFR, Estimated: 52 mL/min — ABNORMAL LOW (ref >60)
Glucose, Bld: 105 mg/dL — ABNORMAL HIGH (ref 70–99)
Potassium: 4.7 mmol/L (ref 3.5–5.1)
Sodium: 143 mmol/L (ref 135–145)

## 2024-07-21 LAB — SOLUBLE TRANSFERRIN RECEPTOR: Transferrin Receptor: 22.5 nmol/L (ref 12.2–27.3)

## 2024-07-23 LAB — PROTEIN ELECTROPHORESIS, SERUM, WITH REFLEX
A/G Ratio: 0.9 (ref 0.7–1.7)
Albumin ELP: 3.1 g/dL (ref 2.9–4.4)
Alpha-1-Globulin: 0.4 g/dL (ref 0.0–0.4)
Alpha-2-Globulin: 0.8 g/dL (ref 0.4–1.0)
Beta Globulin: 1 g/dL (ref 0.7–1.3)
Gamma Globulin: 1.5 g/dL (ref 0.4–1.8)
Globulin, Total: 3.6 g/dL (ref 2.2–3.9)
Total Protein ELP: 6.7 g/dL (ref 6.0–8.5)

## 2024-07-25 ENCOUNTER — Telehealth: Payer: Self-pay

## 2024-07-25 NOTE — Telephone Encounter (Signed)
 Patient was advised that lab results were normal.

## 2024-07-25 NOTE — Telephone Encounter (Signed)
-----   Message from Andrez DELENA Foy sent at 07/24/2024  8:41 AM EDT ----- Please let her know the rest of the tests I ordered were normal. Thanks
